# Patient Record
Sex: Female | Born: 1950
Health system: Southern US, Community
[De-identification: ages and names within clinical notes are randomized; demographics above are authoritative.]

## PROBLEM LIST (undated history)

## (undated) DIAGNOSIS — E119 Type 2 diabetes mellitus without complications: Secondary | ICD-10-CM

## (undated) DIAGNOSIS — J439 Emphysema, unspecified: Secondary | ICD-10-CM

## (undated) DIAGNOSIS — R011 Cardiac murmur, unspecified: Secondary | ICD-10-CM

## (undated) DIAGNOSIS — E079 Disorder of thyroid, unspecified: Secondary | ICD-10-CM

## (undated) DIAGNOSIS — M549 Dorsalgia, unspecified: Secondary | ICD-10-CM

## (undated) DIAGNOSIS — D649 Anemia, unspecified: Secondary | ICD-10-CM

## (undated) DIAGNOSIS — M1990 Unspecified osteoarthritis, unspecified site: Secondary | ICD-10-CM

## (undated) DIAGNOSIS — K219 Gastro-esophageal reflux disease without esophagitis: Secondary | ICD-10-CM

## (undated) DIAGNOSIS — F419 Anxiety disorder, unspecified: Secondary | ICD-10-CM

## (undated) DIAGNOSIS — I8390 Asymptomatic varicose veins of unspecified lower extremity: Secondary | ICD-10-CM

## (undated) DIAGNOSIS — E039 Hypothyroidism, unspecified: Secondary | ICD-10-CM

## (undated) DIAGNOSIS — T7840XA Allergy, unspecified, initial encounter: Secondary | ICD-10-CM

## (undated) HISTORY — DX: Allergy, unspecified, initial encounter: T78.40XA

## (undated) HISTORY — DX: Asymptomatic varicose veins of unspecified lower extremity: I83.90

## (undated) HISTORY — PX: CHOLECYSTECTOMY: SHX55

## (undated) HISTORY — DX: Cardiac murmur, unspecified: R01.1

## (undated) HISTORY — PX: SPINE SURGERY: SHX786

## (undated) HISTORY — DX: Type 2 diabetes mellitus without complications: E11.9

## (undated) HISTORY — PX: VARICOSE VEIN SURGERY: SHX832

## (undated) HISTORY — DX: Dorsalgia, unspecified: M54.9

## (undated) HISTORY — DX: Disorder of thyroid, unspecified: E07.9

## (undated) HISTORY — DX: Emphysema, unspecified: J43.9

---

## 1992-12-22 HISTORY — PX: ABDOMINAL HYSTERECTOMY: SHX81

## 1999-08-13 ENCOUNTER — Observation Stay (HOSPITAL_COMMUNITY): Admission: RE | Admit: 1999-08-13 | Discharge: 1999-08-14 | Payer: Self-pay | Admitting: Urology

## 2001-12-22 HISTORY — PX: INCONTINENCE SURGERY: SHX676

## 2004-10-08 ENCOUNTER — Ambulatory Visit: Payer: Self-pay | Admitting: General Practice

## 2004-12-30 ENCOUNTER — Ambulatory Visit: Payer: Self-pay | Admitting: General Surgery

## 2005-11-05 ENCOUNTER — Ambulatory Visit: Payer: Self-pay | Admitting: General Practice

## 2006-01-13 ENCOUNTER — Ambulatory Visit: Payer: Self-pay | Admitting: General Practice

## 2007-01-19 ENCOUNTER — Ambulatory Visit: Payer: Self-pay | Admitting: General Practice

## 2007-08-20 ENCOUNTER — Ambulatory Visit: Payer: Self-pay | Admitting: General Practice

## 2007-12-23 HISTORY — PX: COLONOSCOPY: SHX174

## 2007-12-23 LAB — HM COLONOSCOPY

## 2008-01-18 ENCOUNTER — Ambulatory Visit: Payer: Self-pay | Admitting: General Practice

## 2008-02-22 ENCOUNTER — Ambulatory Visit: Payer: Self-pay | Admitting: General Practice

## 2008-09-21 ENCOUNTER — Ambulatory Visit: Payer: Self-pay | Admitting: General Surgery

## 2008-10-06 ENCOUNTER — Ambulatory Visit: Payer: Self-pay | Admitting: General Practice

## 2010-02-11 ENCOUNTER — Ambulatory Visit: Payer: Self-pay | Admitting: Unknown Physician Specialty

## 2010-04-01 ENCOUNTER — Ambulatory Visit: Payer: Self-pay | Admitting: General Practice

## 2011-02-18 ENCOUNTER — Ambulatory Visit: Payer: Self-pay | Admitting: Unknown Physician Specialty

## 2012-02-24 ENCOUNTER — Ambulatory Visit: Payer: Self-pay | Admitting: Unknown Physician Specialty

## 2012-12-09 ENCOUNTER — Ambulatory Visit: Payer: Self-pay | Admitting: General Practice

## 2013-03-02 ENCOUNTER — Ambulatory Visit: Payer: Self-pay | Admitting: General Practice

## 2013-08-18 ENCOUNTER — Ambulatory Visit: Payer: Self-pay | Admitting: General Practice

## 2013-08-26 ENCOUNTER — Ambulatory Visit: Payer: Self-pay | Admitting: General Practice

## 2013-08-29 ENCOUNTER — Encounter: Payer: Self-pay | Admitting: *Deleted

## 2013-09-06 ENCOUNTER — Other Ambulatory Visit: Payer: Self-pay | Admitting: General Surgery

## 2013-09-06 ENCOUNTER — Encounter: Payer: Self-pay | Admitting: General Surgery

## 2013-09-06 ENCOUNTER — Ambulatory Visit (INDEPENDENT_AMBULATORY_CARE_PROVIDER_SITE_OTHER): Payer: BC Managed Care – PPO | Admitting: General Surgery

## 2013-09-06 VITALS — BP 118/64 | HR 86 | Resp 12 | Ht 65.0 in | Wt 170.0 lb

## 2013-09-06 DIAGNOSIS — K811 Chronic cholecystitis: Secondary | ICD-10-CM | POA: Insufficient documentation

## 2013-09-06 NOTE — Progress Notes (Signed)
Patient ID: Karla Patton, female   DOB: Dec 30, 1950, 62 y.o.   MRN: [id number]  Chief Complaint  Patient presents with  . Other    evaluation of gallbladder    HPI Karla Patton is a 62 y.o. female who presents for an evaluation of the gallbladder. The patient states she had had abdominal pain for approximately for 1 month. She has also had two episodes of nausea but no vomiting. The most recent abdominal ultrasound was performed on 08/26/13. The patient reports that the pain is typically occurring in the postprandial timeframe, and after 2 meals a Chick filet she had a more severe episodes of pain. Its typically in the right upper quadrant although on at least one occasion there's been some epigastric/left upper quadrant discomfort. There has been some radiation into the upper back on the right side.  The patient does not report any significant change in her blood sugar results except for improvement in her hemoglobin A1c over the last 3 months. She has not experienced any weight loss. She denies any diarrhea.   HPI  Past Medical History  Diagnosis Date  . Varicose vein   . Diabetes mellitus without complication   . Thyroid disease   . Back pain     Past Surgical History  Procedure Laterality Date  . Abdominal hysterectomy  1994  . Incontinence surgery  2003  . Colonoscopy  2009    normal    History reviewed. No pertinent family history.  Social History History  Substance Use Topics  . Smoking status: Never Smoker   . Smokeless tobacco: Not on file  . Alcohol Use: Yes    No Known Allergies  Current Outpatient Prescriptions  Medication Sig Dispense Refill  . ALPRAZolam (XANAX) 0.5 MG tablet Take 0.5 mg by mouth as needed for sleep.      . AMITIZA 24 MCG capsule Take 1 capsule by mouth daily.      [name] aspirin 81 MG tablet Take 81 mg by mouth daily.      [name] HUMALOG 100 UNIT/ML injection Via pump      . levothyroxine (SYNTHROID, LEVOTHROID) 100 MCG tablet Take 1 tablet by  mouth daily.      [name] LYRICA 75 MG capsule Take 1 capsule by mouth 2 (two) times daily.      [name] torsemide (DEMADEX) 20 MG tablet Take 20 mg by mouth as needed.       No current facility-administered medications for this visit.    Review of Systems Review of Systems  Constitutional: Negative.   Respiratory: Negative.   Gastrointestinal: Positive for nausea and abdominal pain.  No change in bowel frequency, diarrhea, mucus or blood per rectum.  Blood pressure 118/64, pulse 86, resp. rate 12, height 5\' 5"  (1.651 m), weight 170 lb (77.111 kg).  Physical Exam Physical Exam  Constitutional: She is oriented to person, place, and time. She appears well-developed and well-nourished.  Neck: No thyromegaly present.  Cardiovascular: Normal rate, regular rhythm and normal heart sounds.   No murmur heard. Pulmonary/Chest: Effort normal and breath sounds normal.  Abdominal: Soft. Normal appearance and bowel sounds are normal. There is tenderness in the right upper quadrant.  Lymphadenopathy:    She has no cervical adenopathy.  Neurological: She is alert and oriented to person, place, and time.  Skin: Skin is warm and dry.    Data Reviewed Abdominal ultrasound dated 08/18/2013 showed an adequately distended gallbladder without wall thickening, pericholecystic fluid or stones. A stable hemangioma  unchanged from 2009 is noted in the right lobe of the liver. HIDA scan with administration of a fatty meal showed an ejection of 54%, but the study was felt compromised by the very minimal activity within the gallbladder. The patient did report mild reproduction of her symptoms during the fatty meal ingestion. The gallbladder was not visualized until 70 minutes in the study.  Hemoglobin A1c dated 06/21/2013 was 8.5. Laboratory studies dated 03/06/2013 showed a sodium of 133, potassium 4.5, creatinine 0.7, hemoglobin 11.6 with an MCV of 88 and white blood cell count of 4800. Hemoglobin A1c at that time was  9.2. Colonoscopy completed October 2009 was normal. Assessment    Chronic cholecystitis.  Long-standing insulin-dependent diabetes well controlled on a pump.    Plan    The patient's clinical symptoms and examination today suggests a mild inflammatory process in the right upper quadrant, most likely related to the biliary tract. The patient is scheduled a [place] to the [place] on [place] 1. Options for management include early surgical intervention, delayed intervention or observation. She is reluctant to become an urgent surgical candidate out of the country, and to allow her maximum time for recovery efforts have been made to schedule her surgery for tomorrow.  The risks and related to surgery including those of bleeding, infection, intestinal or bile duct injury have been discussed. The possibility that this may not remove her pain has been reviewed.    Patient's surgery has been scheduled for 09-07-13 at [facility].   Karla Patton 09/06/2013, 8:43 PM

## 2013-09-06 NOTE — Patient Instructions (Addendum)
Patient to be schedule to have the gallbladder removed.   Laparoscopic Cholecystectomy Laparoscopic cholecystectomy is surgery to remove the gallbladder. The gallbladder is located slightly to the right of center in the abdomen, behind the liver. It is a concentrating and storage sac for the bile produced in the liver. Bile aids in the digestion and absorption of fats. Gallbladder disease (cholecystitis) is an inflammation of your gallbladder. This condition is usually caused by a buildup of gallstones (cholelithiasis) in your gallbladder. Gallstones can block the flow of bile, resulting in inflammation and pain. In severe cases, emergency surgery may be required. When emergency surgery is not required, you will have time to prepare for the procedure. Laparoscopic surgery is an alternative to open surgery. Laparoscopic surgery usually has a shorter recovery time. Your common bile duct may also need to be examined and explored. Your caregiver will discuss this with you if he or she feels this should be done. If stones are found in the common bile duct, they may be removed. LET YOUR CAREGIVER KNOW ABOUT:  Allergies to food or medicine.  Medicines taken, including vitamins, herbs, eyedrops, over-the-counter medicines, and creams.  Use of steroids (by mouth or creams).  Previous problems with anesthetics or numbing medicines.  History of bleeding problems or blood clots.  Previous surgery.  Other health problems, including diabetes and kidney problems.  Possibility of pregnancy, if this applies. RISKS AND COMPLICATIONS All surgery is associated with risks. Some problems that may occur following this procedure include:  Infection.  Damage to the common bile duct, nerves, arteries, veins, or other internal organs such as the stomach or intestines.  Bleeding.  A stone may remain in the common bile duct. BEFORE THE PROCEDURE  Do not take aspirin for 3 days prior to surgery or blood thinners  for 1 week prior to surgery.  Do not eat or drink anything after midnight the night before surgery.  Let your caregiver know if you develop a cold or other infectious problem prior to surgery.  You should be present 60 minutes before the procedure or as directed. PROCEDURE  You will be given medicine that makes you sleep (general anesthetic). When you are asleep, your surgeon will make several small cuts (incisions) in your abdomen. One of these incisions is used to insert a small, lighted scope (laparoscope) into the abdomen. The laparoscope helps the surgeon see into your abdomen. Carbon dioxide gas will be pumped into your abdomen. The gas allows more room for the surgeon to perform your surgery. Other operating instruments are inserted through the other incisions. Laparoscopic procedures may not be appropriate when:  There is major scarring from previous surgery.  The gallbladder is extremely inflamed.  There are bleeding disorders or unexpected cirrhosis of the liver.  A pregnancy is near term.  Other conditions make the laparoscopic procedure impossible. If your surgeon feels it is not safe to continue with a laparoscopic procedure, he or she will perform an open abdominal procedure. In this case, the surgeon will make an incision to open the abdomen. This gives the surgeon a larger view and field to work within. This may allow the surgeon to perform procedures that sometimes cannot be performed with a laparoscope alone. Open surgery has a longer recovery time. AFTER THE PROCEDURE  You will be taken to the recovery area where a nurse will watch and check your progress.  You may be allowed to go home the same day.  Do not resume physical activities until directed  by your caregiver.  You may resume a normal diet and activities as directed. Document Released: 12/08/2005 Document Revised: 03/01/2012 Document Reviewed: 05/23/2011 [facility] Patient Information 2014 [place],  [place].  Patient's surgery has been scheduled for 09-07-13 at [facility].

## 2013-09-07 ENCOUNTER — Other Ambulatory Visit: Payer: Self-pay | Admitting: *Deleted

## 2013-09-07 ENCOUNTER — Telehealth: Payer: Self-pay | Admitting: *Deleted

## 2013-09-07 ENCOUNTER — Encounter: Payer: Self-pay | Admitting: General Surgery

## 2013-09-07 ENCOUNTER — Ambulatory Visit: Payer: Self-pay | Admitting: General Surgery

## 2013-09-07 DIAGNOSIS — K811 Chronic cholecystitis: Secondary | ICD-10-CM

## 2013-09-07 HISTORY — PX: CHOLECYSTECTOMY: SHX55

## 2013-09-07 NOTE — Telephone Encounter (Signed)
Notified patient to move her insulin pump to left lower abdomen per Dr Lemar Livings and she states she has already done that.  She also said her glucose levels are better. Medications corrected in chart.

## 2013-09-08 ENCOUNTER — Encounter: Payer: Self-pay | Admitting: General Surgery

## 2013-09-08 LAB — PATHOLOGY REPORT

## 2013-09-12 ENCOUNTER — Encounter: Payer: Self-pay | Admitting: General Surgery

## 2013-09-14 ENCOUNTER — Ambulatory Visit (INDEPENDENT_AMBULATORY_CARE_PROVIDER_SITE_OTHER): Payer: BC Managed Care – PPO | Admitting: General Surgery

## 2013-09-14 ENCOUNTER — Encounter: Payer: Self-pay | Admitting: General Surgery

## 2013-09-14 VITALS — BP 120/68 | HR 74 | Resp 12 | Ht 65.0 in | Wt 168.0 lb

## 2013-09-14 DIAGNOSIS — K811 Chronic cholecystitis: Secondary | ICD-10-CM

## 2013-09-14 NOTE — Progress Notes (Signed)
Patient ID: Karla Patton, female   DOB: Aug 05, 1951, 62 y.o.   MRN: [id number]  Chief Complaint  Patient presents with  . Routine Post Op    gallbladder    HPI Karla Patton is a 62 y.o. female post op gallbladder. The procedure was done on 917/14. Patient states she is doing well. She reports it took the better part of a week to get her bowels back in order. No present difficulty with bowel movements.  HPI  Past Medical History  Diagnosis Date  . Varicose vein   . Diabetes mellitus without complication   . Thyroid disease   . Back pain     Past Surgical History  Procedure Laterality Date  . Abdominal hysterectomy  1994  . Incontinence surgery  2003  . Colonoscopy  2009    normal    No family history on file.  Social History History  Substance Use Topics  . Smoking status: Never Smoker   . Smokeless tobacco: Not on file  . Alcohol Use: Yes    No Known Allergies  Current Outpatient Prescriptions  Medication Sig Dispense Refill  . ALPRAZolam (XANAX) 0.5 MG tablet Take 0.5 mg by mouth as needed for sleep.      . AMITIZA 24 MCG capsule Take 1 capsule by mouth 2 (two) times daily with a meal.       . aspirin 81 MG tablet Take 81 mg by mouth daily.      [name] HUMALOG 100 UNIT/ML injection Via pump      . levothyroxine (SYNTHROID, LEVOTHROID) 100 MCG tablet Take 1 tablet by mouth daily.      [name] LYRICA 75 MG capsule Take 1 capsule by mouth 3 (three) times daily.       [name] torsemide (DEMADEX) 20 MG tablet Take 20 mg by mouth as needed.       No current facility-administered medications for this visit.    Review of Systems Review of Systems  Constitutional: Negative.   Respiratory: Negative.   Cardiovascular: Negative.     Blood pressure 120/68, pulse 74, resp. rate 12, height 5\' 5"  (1.651 m), weight 168 lb (76.204 kg).  Physical Exam Physical Exam  Constitutional: She is oriented to person, place, and time. She appears well-developed and well-nourished.   Cardiovascular: Normal rate, regular rhythm and normal heart sounds.   Pulmonary/Chest: Breath sounds normal.  Abdominal: Bowel sounds are normal.  Neurological: She is alert and oriented to person, place, and time.  Skin: Skin is warm and dry.    Data Reviewed Pathology showed chronic cholecystitis as well as fundic adenomyosis.  Assessment    Doing well status post cholecystectomy.    Plan    The patient was released to full activity. Care with heavy lifting was encouraged. Followup here will be on an as-needed basis.       Earline Mayotte 09/14/2013, 3:21 PM

## 2013-09-14 NOTE — Patient Instructions (Addendum)
Proper lifting techniques reviewed. 

## 2013-11-10 ENCOUNTER — Ambulatory Visit (INDEPENDENT_AMBULATORY_CARE_PROVIDER_SITE_OTHER): Payer: BC Managed Care – PPO | Admitting: General Surgery

## 2013-11-10 ENCOUNTER — Encounter: Payer: Self-pay | Admitting: General Surgery

## 2013-11-10 VITALS — BP 124/64 | HR 76 | Resp 14 | Ht 65.5 in | Wt 173.0 lb

## 2013-11-10 DIAGNOSIS — I83893 Varicose veins of bilateral lower extremities with other complications: Secondary | ICD-10-CM

## 2013-11-10 NOTE — Progress Notes (Signed)
Patient ID: Karla Patton, female   DOB: Jul 12, 1951, 62 y.o.   MRN: [id number]  Chief Complaint  Patient presents with  . Varicose Veins    HPI Karla Patton is a 62 y.o. female.  Here today for varicose veins evaluation. She has been seeing Dr Gilda Crease and was suppose to have a treatment but on the right leg.  The patient states that the left leg seems to be causing more trouble.  The left leg upper thigh and knee is where pain is and the right leg seems to be "ok". Wearing the compression hose since July and has not seen a big change.  The hose has helped the left ankle from swelling as much. She would like a second opinion. Dr Earnestine Leys treated both legs with laser and saline.  HPI  Past Medical History  Diagnosis Date  . Varicose vein   . Diabetes mellitus without complication   . Thyroid disease   . Back pain     Past Surgical History  Procedure Laterality Date  . Abdominal hysterectomy  1994  . Incontinence surgery  2003  . Colonoscopy  2009    normal  . Varicose vein surgery  1990's    Dr Lemar Livings  . Cholecystectomy  09-07-13    History reviewed. No pertinent family history.  Social History History  Substance Use Topics  . Smoking status: Never Smoker   . Smokeless tobacco: Not on file  . Alcohol Use: Yes    No Known Allergies  Current Outpatient Prescriptions  Medication Sig Dispense Refill  . Nutritional Supplements (ESTROVEN PO) Take 1 tablet by mouth daily.      [name] ALPRAZolam (XANAX) 0.5 MG tablet Take 0.5 mg by mouth as needed for sleep.      . AMITIZA 24 MCG capsule Take 1 capsule by mouth 2 (two) times daily with a meal.       . aspirin 81 MG tablet Take 81 mg by mouth daily.      [name] HUMALOG 100 UNIT/ML injection Via pump      . levothyroxine (SYNTHROID, LEVOTHROID) 100 MCG tablet Take 1 tablet by mouth daily.      [name] LYRICA 75 MG capsule Take 1 capsule by mouth 3 (three) times daily.       [name] torsemide (DEMADEX) 20 MG tablet Take 20 mg by mouth as needed.        No current facility-administered medications for this visit.    Review of Systems Review of Systems  Constitutional: Negative.   Respiratory: Negative.   Cardiovascular: Negative.     Blood pressure 124/64, pulse 76, resp. rate 14, height 5' 5.5" (1.664 m), weight 173 lb (78.472 kg).  Physical Exam Physical Exam  Constitutional: She is oriented to person, place, and time. She appears well-developed and well-nourished.  Eyes: Conjunctivae are normal. No scleral icterus.  Neck: Neck supple.  Cardiovascular: Normal rate, regular rhythm and normal heart sounds.   Pulses:      Dorsalis pedis pulses are 2+ on the right side, and 2+ on the left side.       Posterior tibial pulses are 2+ on the right side, and 2+ on the left side.  No lower leg edema noted at this time. Varicose veins seen bilateral. Left side > right side, areas involved calf on both sides and anteromedial left thigh  Pulmonary/Chest: Effort normal and breath sounds normal.  Lymphadenopathy:    She has no cervical adenopathy.  Neurological: She is alert  and oriented to person, place, and time.  Skin: Skin is warm and dry.    Data Reviewed Dr. Marijean Heath notes. 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 She has used 20-30 mm hg hose , knee lengths. Pt has a lot more vv on left side but no apparent insufficient major channel was seen. She may have better control with 30-40 mm hg compression and thigh length on left side.  Discussed fully with and she is aggreable.      Plan    Rx given for thigh length stocking for left leg, 30-40 mm hg.  Reassess in 2 mos with left leg venous Duplex study.       SANKAR,SEEPLAPUTHUR G 11/11/2013, 5:42 PM

## 2013-11-10 NOTE — Patient Instructions (Addendum)
The patient is aware to call back for any questions or concerns. Thigh length compression hose.

## 2013-11-11 ENCOUNTER — Encounter: Payer: Self-pay | Admitting: General Surgery

## 2013-11-11 DIAGNOSIS — I83819 Varicose veins of unspecified lower extremities with pain: Secondary | ICD-10-CM | POA: Insufficient documentation

## 2013-11-11 DIAGNOSIS — I83893 Varicose veins of bilateral lower extremities with other complications: Secondary | ICD-10-CM | POA: Insufficient documentation

## 2013-12-22 HISTORY — PX: OTHER SURGICAL HISTORY: SHX169

## 2014-01-10 ENCOUNTER — Encounter: Payer: Self-pay | Admitting: General Surgery

## 2014-01-10 ENCOUNTER — Ambulatory Visit (INDEPENDENT_AMBULATORY_CARE_PROVIDER_SITE_OTHER): Payer: BC Managed Care – PPO | Admitting: General Surgery

## 2014-01-10 ENCOUNTER — Other Ambulatory Visit: Payer: BC Managed Care – PPO

## 2014-01-10 VITALS — BP 138/68 | HR 80 | Resp 12 | Ht 65.5 in | Wt 170.0 lb

## 2014-01-10 DIAGNOSIS — I83893 Varicose veins of bilateral lower extremities with other complications: Secondary | ICD-10-CM

## 2014-01-10 NOTE — Progress Notes (Signed)
Patient ID: Karla Patton, female   DOB: 13-Jul-1951, 63 y.o.   MRN: [id number]  Chief Complaint  Patient presents with  . Follow-up    left leg venous ultrasound    HPI Karla Patton is a 63 y.o. female.  Here today for follow up left leg venous ultrasound. No new complaints. Wearing her compression hose. She has noticied less ankle swelling with the hose.  Recovering from upper respiratory infection. She is still having discomfort in left leg at day's end even with compression hose. Right leg is not symptomatic. HPI  Past Medical History  Diagnosis Date  . Varicose vein   . Diabetes mellitus without complication   . Thyroid disease   . Back pain     Past Surgical History  Procedure Laterality Date  . Abdominal hysterectomy  1994  . Incontinence surgery  2003  . Colonoscopy  2009    normal  . Varicose vein surgery  1990's    Dr Bary Castilla  . Cholecystectomy  09-07-13    History reviewed. No pertinent family history.  Social History History  Substance Use Topics  . Smoking status: Never Smoker   . Smokeless tobacco: Not on file  . Alcohol Use: Yes    No Known Allergies  Current Outpatient Prescriptions  Medication Sig Dispense Refill  . ALPRAZolam (XANAX) 0.5 MG tablet Take 0.5 mg by mouth as needed for sleep.      . AMITIZA 24 MCG capsule Take 1 capsule by mouth 2 (two) times daily with a meal.       . aspirin 81 MG tablet Take 81 mg by mouth daily.      [name] BAYER CONTOUR NEXT TEST test strip       . HUMALOG 100 UNIT/ML injection Via pump      . levothyroxine (SYNTHROID, LEVOTHROID) 100 MCG tablet Take 1 tablet by mouth daily.      [name] LYRICA 75 MG capsule Take 1 capsule by mouth 3 (three) times daily.       . Nutritional Supplements (ESTROVEN PO) Take 1 tablet by mouth daily.      [name] torsemide (DEMADEX) 20 MG tablet Take 20 mg by mouth as needed.       No current facility-administered medications for this visit.    Review of Systems Review of Systems   Constitutional: Negative.   Respiratory: Negative.   Cardiovascular: Negative.     Blood pressure 138/68, pulse 80, resp. rate 12, height 5' 5.5" (1.664 m), weight 170 lb (77.111 kg).  Physical Exam Physical Exam  Constitutional: She is oriented to person, place, and time. She appears Patton-developed and Patton-nourished.  Cardiovascular:  Pulses:      Dorsalis pedis pulses are 2+ on the right side, and 2+ on the left side.       Posterior tibial pulses are 2+ on the right side, and 2+ on the left side.  VV inleft thigh and calf , up to 21mm size. No vv seen in right leg. No edema.  Neurological: She is alert and oriented to person, place, and time.  Skin: Skin is warm and dry.    Data Reviewed Left leg venous Duplex shows vv connecting directly to the femoral vein. There is a long segment of GSV , very small in size in mid thigh.It is incompetant but does not seem to extend to the femoral vein.  Assessment    Symptomatic vv left leg. Best option is ligation of GSV at [facility]. Discussed this  fully with pt and she is agreeable.      Plan    Ligation of left GSV.        SANKAR,SEEPLAPUTHUR G 01/13/2014, 9:19 AM

## 2014-01-10 NOTE — Patient Instructions (Signed)
Patient to return for procedure.

## 2014-01-13 ENCOUNTER — Encounter: Payer: Self-pay | Admitting: General Surgery

## 2014-01-16 ENCOUNTER — Encounter: Payer: Self-pay | Admitting: General Surgery

## 2014-01-17 ENCOUNTER — Other Ambulatory Visit: Payer: Self-pay | Admitting: General Practice

## 2014-01-18 LAB — HM PAP SMEAR

## 2014-01-30 ENCOUNTER — Encounter: Payer: Self-pay | Admitting: General Surgery

## 2014-02-28 ENCOUNTER — Encounter: Payer: Self-pay | Admitting: General Surgery

## 2014-02-28 ENCOUNTER — Ambulatory Visit (INDEPENDENT_AMBULATORY_CARE_PROVIDER_SITE_OTHER): Payer: BC Managed Care – PPO | Admitting: General Surgery

## 2014-02-28 VITALS — BP 118/62 | HR 76 | Resp 12 | Ht 65.5 in | Wt 177.0 lb

## 2014-02-28 DIAGNOSIS — I83893 Varicose veins of bilateral lower extremities with other complications: Secondary | ICD-10-CM

## 2014-02-28 NOTE — Patient Instructions (Signed)
Keep area clean Derm a bond will gradually peel off

## 2014-02-28 NOTE — Progress Notes (Signed)
Patient ID: Karla Patton, female   DOB: 25-Dec-1950, 63 y.o.   MRN: [id number]   The patient presents for a left leg vein ligation procedure. No new problems at this time.   Procedure note: Patient was placed supine on the procedure table. Ultrasound was performed to document the site of saphenofemoral junction in left groin. This area was marked. 10 mL of 1% Xylocaine mixed with 0.5% Marcaine was instilled. The area was prepped with ChloraPrep. 2 cm transverse incision was made. Further dissection was done to identify a couple of tributaries connecting to the tortuous saphenous vein and these were taken down with ligatures of 3-0 Vicryl. The main saphenous vein was isolated clamped cut and ligated with 3-0 Vicryl. 3-0 Vicryl sutures placed the deep tissues and the skin closed with subcuticular 4-0 Vicryl covered with Dermabond. Procedure was well-tolerated and no immediate problems were encountered.

## 2014-03-02 ENCOUNTER — Encounter: Payer: Self-pay | Admitting: General Surgery

## 2014-03-08 ENCOUNTER — Ambulatory Visit (INDEPENDENT_AMBULATORY_CARE_PROVIDER_SITE_OTHER): Payer: BC Managed Care – PPO | Admitting: General Surgery

## 2014-03-08 ENCOUNTER — Encounter: Payer: Self-pay | Admitting: General Surgery

## 2014-03-08 VITALS — BP 128/70 | HR 74 | Resp 12 | Ht 65.0 in | Wt 176.0 lb

## 2014-03-08 DIAGNOSIS — I83893 Varicose veins of bilateral lower extremities with other complications: Secondary | ICD-10-CM

## 2014-03-08 NOTE — Progress Notes (Signed)
Patient ID: Karla Patton, female   DOB: 07-01-1951, 63 y.o.   MRN: [id number]  The patient presents for a post op left leg GSV ligation procedure. The patient denies any new problems. Patient doing well.   Patient reports notable reduction left leg discomfort. Left groin incision is healing well. Advised to continue to use compression hose. Patient to follow up in 6 weeks.

## 2014-03-08 NOTE — Patient Instructions (Signed)
Patient to return in 6 weeks for follow up. The patient is aware to call back for any questions or concerns.  

## 2014-03-10 ENCOUNTER — Ambulatory Visit: Payer: Self-pay | Admitting: General Practice

## 2014-03-10 LAB — HM MAMMOGRAPHY

## 2014-03-21 ENCOUNTER — Encounter: Payer: Self-pay | Admitting: General Surgery

## 2014-03-21 ENCOUNTER — Ambulatory Visit (INDEPENDENT_AMBULATORY_CARE_PROVIDER_SITE_OTHER): Payer: BC Managed Care – PPO | Admitting: General Surgery

## 2014-03-21 VITALS — BP 140/70 | HR 76 | Resp 14 | Ht 65.5 in | Wt 175.0 lb

## 2014-03-21 DIAGNOSIS — Z1211 Encounter for screening for malignant neoplasm of colon: Secondary | ICD-10-CM

## 2014-03-21 NOTE — Patient Instructions (Addendum)
Patient to return as needed. She is to be scheduled for a colonoscopy in 2019. The patient is aware to call back for any questions or concerns.

## 2014-03-21 NOTE — Progress Notes (Signed)
Patient ID: Karla Patton, female   DOB: March 30, 1951, 63 y.o.   MRN: [id number]  Chief Complaint  Patient presents with  . Other    screening colonoscopy    HPI Karla Patton is a 63 y.o. female who presents for a discussion for a screening colonoscopy. The patient denies any new problems at this time. The patient states she takes Amitiza for constipation which has not been working lately. She has added benefiber to help with bowel movements. Except for long-standing, intermittent constipation she has not noticed a marked change in her bowel habits. No history of blood or mucus. No unexplained weight loss or dietary intolerance.   HPI  Past Medical History  Diagnosis Date  . Varicose vein   . Diabetes mellitus without complication   . Thyroid disease   . Back pain     Past Surgical History  Procedure Laterality Date  . Abdominal hysterectomy  1994  . Incontinence surgery  2003  . Colonoscopy  2009    normal  . Varicose vein surgery  1990's    Dr Bary Castilla  . Cholecystectomy  09-07-13  . Left leg vein ligation  2015    History reviewed. No pertinent family history.  Social History History  Substance Use Topics  . Smoking status: Never Smoker   . Smokeless tobacco: Not on file  . Alcohol Use: Yes    No Known Allergies  Current Outpatient Prescriptions  Medication Sig Dispense Refill  . ALPRAZolam (XANAX) 0.5 MG tablet Take 0.5 mg by mouth as needed for sleep.      . AMITIZA 24 MCG capsule Take 1 capsule by mouth 2 (two) times daily with a meal.       . aspirin 81 MG tablet Take 81 mg by mouth daily.      [name] BAYER CONTOUR NEXT TEST test strip       . HUMALOG 100 UNIT/ML injection Via pump      . LANTUS 100 UNIT/ML injection       . levothyroxine (SYNTHROID, LEVOTHROID) 100 MCG tablet Take 1 tablet by mouth daily.      [name] LYRICA 75 MG capsule Take 1 capsule by mouth 3 (three) times daily.       . Nutritional Supplements (ESTROVEN PO) Take 1 tablet by mouth daily.      [name]  torsemide (DEMADEX) 20 MG tablet Take 20 mg by mouth as needed.      . Wheat Dextrin (BENEFIBER PO) Take by mouth.       No current facility-administered medications for this visit.    Review of Systems Review of Systems  Constitutional: Negative.   Respiratory: Negative.   Cardiovascular: Negative.   Gastrointestinal: Negative.     Blood pressure 140/70, pulse 76, resp. rate 14, height 5' 5.5" (1.664 m), weight 175 lb (79.379 kg).  Physical Exam Physical Exam  Constitutional: She is oriented to person, place, and time. She appears well-developed and well-nourished.  Neck: Neck supple. No thyromegaly present.  Cardiovascular: Normal rate and regular rhythm.   Murmur heard.  Systolic murmur is present with a grade of 1/6  Pulmonary/Chest: Effort normal and breath sounds normal.  Abdominal: Soft. Normal appearance and bowel sounds are normal. There is no hepatosplenomegaly. There is no tenderness. No hernia.  Lymphadenopathy:    She has no cervical adenopathy.  Neurological: She is alert and oriented to person, place, and time.  Skin: Skin is warm and dry.    Data Reviewed Colonoscopy dated  09/21/2008 was reviewed. This was a normal exam.  Assessment    No family history of colon cancer polyps, normal colonoscopy in 2009.    Plan    I would discourage the patient from having a followup colonoscopy exam at this time. She is asymptomatic and the risks associated with the procedure are greater than the potential for finding a malignant process. Reexamination in 2019 is encouraged unless she develops colonic symptoms in the interval.     PCP: Nelda Severe. Burt Ek MD   Robert Bellow 03/22/2014, 5:52 PM

## 2014-03-22 DIAGNOSIS — Z1211 Encounter for screening for malignant neoplasm of colon: Secondary | ICD-10-CM | POA: Insufficient documentation

## 2014-04-18 ENCOUNTER — Ambulatory Visit (INDEPENDENT_AMBULATORY_CARE_PROVIDER_SITE_OTHER): Payer: BC Managed Care – PPO | Admitting: General Surgery

## 2014-04-18 ENCOUNTER — Encounter: Payer: Self-pay | Admitting: General Surgery

## 2014-04-18 VITALS — BP 126/74 | HR 74 | Resp 12 | Ht 65.5 in | Wt 173.0 lb

## 2014-04-18 DIAGNOSIS — I83893 Varicose veins of bilateral lower extremities with other complications: Secondary | ICD-10-CM

## 2014-04-18 NOTE — Patient Instructions (Addendum)
Patient to continue use of compression hose. Patient to return in 6 months for a follow up. The patient is aware to call back for any questions or concerns.

## 2014-04-18 NOTE — Progress Notes (Signed)
Patient ID: Karla Patton, female   DOB: 1951-12-03, 63 y.o.   MRN: [id number]  Chief Complaint  Patient presents with  . Follow-up    Veins    HPI Karla Patton is a 63 y.o. female here today following up for her veins. Patient states she is doing well and wearing her compression hose.  HPI  Past Medical History  Diagnosis Date  . Varicose vein   . Diabetes mellitus without complication   . Thyroid disease   . Back pain     Past Surgical History  Procedure Laterality Date  . Abdominal hysterectomy  1994  . Incontinence surgery  2003  . Colonoscopy  2009    normal  . Varicose vein surgery  1990's    Dr Bary Castilla  . Cholecystectomy  09-07-13  . Left leg vein ligation  2015    No family history on file.  Social History History  Substance Use Topics  . Smoking status: Never Smoker   . Smokeless tobacco: Not on file  . Alcohol Use: Yes    No Known Allergies  Current Outpatient Prescriptions  Medication Sig Dispense Refill  . ALPRAZolam (XANAX) 0.5 MG tablet Take 0.5 mg by mouth as needed for sleep.      . AMITIZA 24 MCG capsule Take 1 capsule by mouth 2 (two) times daily with a meal.       . aspirin 81 MG tablet Take 81 mg by mouth daily.      Karla Patton BAYER CONTOUR NEXT TEST test strip       . HUMALOG 100 UNIT/ML injection Via pump      . LANTUS 100 UNIT/ML injection       . levothyroxine (SYNTHROID, LEVOTHROID) 100 MCG tablet Take 1 tablet by mouth daily.      Karla Patton LYRICA 75 MG capsule Take 1 capsule by mouth 3 (three) times daily.       . Nutritional Supplements (ESTROVEN PO) Take 1 tablet by mouth daily.      Karla Patton torsemide (DEMADEX) 20 MG tablet Take 20 mg by mouth as needed.       No current facility-administered medications for this visit.    Review of Systems Review of Systems  Constitutional: Negative.   Respiratory: Negative.   Cardiovascular: Negative.      Blood pressure 126/74, pulse 74, resp. rate 12, height 5' 5.5" (1.664 m), weight 173 lb (78.472  kg).  Physical Exam Physical Exam  Constitutional: She is oriented to person, place, and time. She appears well-developed and well-nourished.  Cardiovascular: Normal rate, regular rhythm and normal heart sounds.   No murmur heard. Pulses:      Dorsalis pedis pulses are 2+ on the right side, and 2+ on the left side.       Posterior tibial pulses are 2+ on the right side, and 2+ on the left side.  Pulmonary/Chest: Effort normal and breath sounds normal.  Abdominal: Soft. Bowel sounds are normal. There is no hepatosplenomegaly. There is no tenderness. No hernia.  Neurological: She is alert and oriented to person, place, and time.  Skin: Skin is warm and dry.  She has some residual varicose veins in the posterior left calf as well as the right. No edema present.  Minimal left thigh veins.   Data Reviewed  None  Assessment   S/P ligation of left VV connection to femoral vein.  Varicose veins with improved symptoms in left leg. She has an incompetent right GSV which is asymptomatic  at present.     Plan    Patient to return in 6 months for follow up. Patient advised to continue the use of compression hose.        Karla Patton 04/18/2014, 9:36 AM

## 2014-04-25 LAB — HM DIABETES EYE EXAM

## 2014-10-03 LAB — TSH: TSH: 1.51 u[IU]/mL (ref 0.41–5.90)

## 2014-10-03 LAB — HEMOGLOBIN A1C: Hgb A1c MFr Bld: 8.6 % — AB (ref 4.0–6.0)

## 2014-10-03 LAB — LIPID PANEL
Cholesterol: 218 mg/dL — AB (ref 0–200)
HDL: 74 mg/dL — AB (ref 35–70)
LDL Cholesterol: 131 mg/dL
Triglycerides: 67 mg/dL (ref 40–160)

## 2014-10-03 LAB — BASIC METABOLIC PANEL
BUN: 8 mg/dL (ref 4–21)
Creatinine: 0.7 mg/dL (ref 0.5–1.1)
Glucose: 86 mg/dL
Potassium: 4.3 mmol/L (ref 3.4–5.3)
Sodium: 132 mmol/L — AB (ref 137–147)

## 2014-10-03 LAB — HEPATIC FUNCTION PANEL
ALT: 22 U/L (ref 7–35)
AST: 26 U/L (ref 13–35)
Alkaline Phosphatase: 93 U/L (ref 25–125)
Bilirubin, Total: 0.3 mg/dL

## 2014-10-03 LAB — CBC AND DIFFERENTIAL
HCT: 36 % (ref 36–46)
Hemoglobin: 12.4 g/dL (ref 12.0–16.0)
Platelets: 390 10*3/uL (ref 150–399)
WBC: 4.6 10^3/mL

## 2014-10-10 ENCOUNTER — Encounter: Payer: Self-pay | Admitting: Endocrinology

## 2014-10-10 ENCOUNTER — Ambulatory Visit (INDEPENDENT_AMBULATORY_CARE_PROVIDER_SITE_OTHER): Payer: BC Managed Care – PPO | Admitting: Endocrinology

## 2014-10-10 VITALS — BP 130/72 | HR 75

## 2014-10-10 DIAGNOSIS — E1165 Type 2 diabetes mellitus with hyperglycemia: Secondary | ICD-10-CM

## 2014-10-10 DIAGNOSIS — E11649 Type 2 diabetes mellitus with hypoglycemia without coma: Secondary | ICD-10-CM

## 2014-10-10 DIAGNOSIS — E039 Hypothyroidism, unspecified: Secondary | ICD-10-CM | POA: Insufficient documentation

## 2014-10-10 DIAGNOSIS — E109 Type 1 diabetes mellitus without complications: Secondary | ICD-10-CM | POA: Insufficient documentation

## 2014-10-10 DIAGNOSIS — IMO0002 Reserved for concepts with insufficient information to code with codable children: Secondary | ICD-10-CM

## 2014-10-10 LAB — HM DIABETES FOOT EXAM: HM Diabetic Foot Exam: NORMAL

## 2014-10-10 MED ORDER — GLUCAGON HCL (RDNA) 1 MG IJ SOLR: 1.0000 mg | Freq: Once | INTRAMUSCULAR | Status: DC | PRN

## 2014-10-10 NOTE — Progress Notes (Signed)
Reason for visit-  Karla Patton is a 63 y.o.-year-old female, referred by her PCP,  Everlean Alstrom, MD for management of likelyType 2 diabetes, uncontrolled, without complications.   HPI- Patient has been diagnosed with diabetes in 1984. Recalls being initially on lifestyle modifications and oral medications for about 18 months. Was on an insulin pump for 11 years- 2014 when A1c >10%, then moved back to basal/bolus insulin, because she felt that pump was not working well for her. Then started on lantus and novolog pens- last Friday, October 2015 switched to [facility] due to frequent night time lows. Feels that night time lows have decreased after this switch.  Pt is currently on a regimen of:  - Toujeo 18 units qhs - Novolog per carb calcutions 1: 15 carbs, tgt glucose 150, ISF 50 units qac TID Reports that insulin drips after some injections inspite of counting to 5. When she notices that, she takes an additional unit or so, to counter this.  She has also been correcting for higher sugars >150 about 1-2 times daily with Novolog - most times in about 2 hours PP. Adjusted target glucose to 150 from 100 recently to prevent lows.    Last hemoglobin A1c was: Lab Results  Component Value Date   HGBA1C 8.6* 10/03/2014     Pt checks her sugars 10x a day . Uses Bayer glucometer. By meter review they are:  PREMEAL Breakfast Lunch Dinner Bedtime Overall  Glucose range: 73-149- 212-58-44 66-196- 121 256 147-96- 53-44-57   Mean/median:        POST-MEAL PC Breakfast PC Lunch PC Dinner  Glucose range: 60-76 211 106  Mean/median:       Hypoglycemia-  frequent lows. Lowest sugar was 32 this morning; she has hypoglycemia awareness at 70-100- however this has been happening only on occasion. Significant other is retired EMT and treated her with juice. Drank 2-2.5 glasses alcohol last night. Sugar was normal range prior to arrival in the 90s range. Reports low sugars every other day for the  past 2 months. Lows at all times of the day. 3 weeks ago, needed EMT help when passed out from low sugars.   Dietary habits- eats  Mainly 2 times daily-occasionally 3 times. Tries to limit carbs, sweetened beverages. Has desserts- cheesecakes about 2 x weekly atleast.  Exercise- no, plans to start treadmill Weight -  Reports gaining 40 lbs in past 5 years. Stable recently Wt Readings from Last 3 Encounters:  04/18/14 173 lb (78.472 kg)  03/21/14 175 lb (79.379 kg)  03/08/14 176 lb (79.833 kg)    Diabetes Complications-  Nephropathy- No  CKD, last BUN/creatinine- GFR 94 Lab Results  Component Value Date   BUN 8 10/03/2014   Lab Results  Component Value Date   CREATININE 0.7 10/03/2014    Retinopathy- No, Last DEE was in May 2015 Neuropathy- occasional burning of her toes from her back pain. Takes lyrica for back. No numbness and tingling in her feet. No known neuropathy.  Associated history - No CAD . No prior stroke. Has hypothyroidism. her last TSH was  Lab Results  Component Value Date   TSH 1.51 10/03/2014  Being treated with Levothyroxine by PCP.   Hyperlipidemia-  her last set of lipids were- Currently on dietary therapy. Tolerating well.    Lab Results  Component Value Date   CHOL 218* 10/03/2014   HDL 74* 10/03/2014   LDLCALC 131 10/03/2014   TRIG 67 10/03/2014    Blood Pressure/HTN-  Patient's blood pressure is well controlled today.   Pt has FH of DM in mother.   I have reviewed the patient's past medical history, family and social history, surgical history, medications and allergies.    Review of Systems: [x]  complains of  [  ] denies General:   [x  ] Recent weight change [ x ] Fatigue  [ x ] Loss of appetite Eyes: [ x ]  Vision Difficulty [  ]  Eye pain ENT: [  ]  Hearing difficulty [  ]  Difficulty Swallowing CVS: [  ] Chest pain [  ]  Palpitations/Irregular Heart beat [  ]  Shortness of breath lying flat [ x ] Swelling of legs Resp: [  ] Frequent Cough  [  ] Shortness of Breath  [  ]  Wheezing GI: [  ] Heartburn  [  ] Nausea or Vomiting  [  ] Diarrhea [ x ] Constipation  [  ] Abdominal Pain GU: [  ]  Polyuria  [ x ]  nocturia Bones/joints:  [  ]  Muscle aches  [  ] Joint Pain  [  ] Bone pain Skin/Hair/Nails: [  ]  Rash  [  ] New stretch marks [  ]  Itching [  ] Hair loss [  ]  Excessive hair growth Reproduction: [ x ] Low sexual desire , [  ]  Women: Menstrual cycle problems [  ]  Women: Breast Discharge [  ] Men: Difficulty with erections [  ]  Men: Enlarged Breasts CNS: [ x ] Frequent Headaches [ x ] Blurry vision [  ] Tremors [  ] Seizures [ x ] Loss of consciousness [  ] Localized weakness Endocrine: [  ]  Excess thirst [  ]  Feeling excessively hot [  ]  Feeling excessively cold Heme: [ x ]  Easy bruising [  ]  Enlarged glands or lumps in neck Allergy: [  ]  Food allergies [  ] Environmental allergies  PE: BP 130/72  Pulse 75  SpO2 96% Wt Readings from Last 3 Encounters:  04/18/14 173 lb (78.472 kg)  03/21/14 175 lb (79.379 kg)  03/08/14 176 lb (79.833 kg)   GENERAL: No acute distress, well developed HEENT:  Eye exam shows normal external appearance. Oral exam shows normal mucosa .  NECK:   Neck exam shows no lymphadenopathy. No Carotids bruits. Thyroid is not enlarged and no nodules felt.   LUNGS:         Chest is symmetrical. Lungs are clear to auscultation.[name]   HEART:         Heart sounds:  S1 and S2 are normal. No murmurs or clicks heard. ABDOMEN:  No Distention present. Liver and spleen are not palpable. No other mass or tenderness present.  EXTREMITIES:     There is no edema. 2+ DP pulses  NEUROLOGICAL:     Grossly intact.            Diabetic foot exam done with shoes and socks removed: Normal Monofilament testing bilaterally. Right great toe deformity due to prior surgery after injury. Nails  Not dystrophic. Skin normal color. No open wounds. MUSCULOSKELETAL:       There is no enlargement or gross deformity of the joints.   SKIN:       No rash  ASSESSMENT AND PLAN: 1. Likely type 2 Diabetes, uncontrolled 2. Hypoglycemia  Problem List Items Addressed This Visit  Endocrine   Diabetic hypoglycemia     Hypoglycemia recognition and treatment protocol discussed. Glucagon script given to patient with instructions on how to use it.  Feel that her hypoglycemia is likely related to over-correction with Novolog, skipping meals, over treating low sugars with subsequent highs, and alcohol intake.   She will do the following - eat consistent meals, avoid skipping meals, avoid alcohol, avoid excess exercise for now.  Goal over next 2 weeks  is to avoid hypoglycemia .   RTC 2 weeks. Could consider diagnostic CGM testing if hypoglycemia persists.     Relevant Medications      Insulin Glargine (TOUJEO SOLOSTAR [place])      insulin aspart (NOVOLOG) 100 UNIT/ML injection      glucagon (GLUCAGEN) 1 MG SOLR injection     Other   Type 2 diabetes mellitus, uncontrolled - Primary      She could have likely type 2 diabetes, but will test for etiology at next few visits.   Discussed that recent A1c was not at goal, and she has high variability in her sugars.  She wishes to continue on basal/bolus insulin plan for now. Discussed insulin administration and preventing leakage from injection site.   Change Toujeo to 16 units daily.  Continue current Novolog dosing for meal times, but she was asked to avoid using this for correction in between meals.  Continue to check sugars 4-6 times daily.   Up to date with eye exam and foot exam for this year.  LDL higher range, will readdress at future visits. Dietary therapy for now.  BP okay for now. Needs urine MA at next visits.   Discussed at length about diet modifications, and portion sizes and reducing carbs.     Relevant Medications      Insulin Glargine (TOUJEO SOLOSTAR )      insulin aspart (NOVOLOG) 100 UNIT/ML injection      glucagon (GLUCAGEN) 1 MG SOLR injection         - Return to clinic in 2 weeks with sugar log/meter. 50 minutes spent with the patient, > 50% time spent on topics and counseling mentioned above.   Tu Shimmel [facility] 10/10/2014 12:55 PM

## 2014-10-10 NOTE — Assessment & Plan Note (Addendum)
She could have likely type 2 diabetes, but will test for etiology at next few visits.   Discussed that recent A1c was not at goal, and she has high variability in her sugars.  She wishes to continue on basal/bolus insulin plan for now. Discussed insulin administration and preventing leakage from injection site.   Change Toujeo to 16 units daily.  Continue current Novolog dosing for meal times, but she was asked to avoid using this for correction in between meals.  Continue to check sugars 4-6 times daily.   Up to date with eye exam and foot exam for this year.  LDL higher range, will readdress at future visits. Dietary therapy for now.  BP okay for now. Needs urine MA at next visits.   Discussed at length about diet modifications, and portion sizes and reducing carbs.

## 2014-10-10 NOTE — Progress Notes (Signed)
Pre visit review using our clinic review tool, if applicable. No additional management support is needed unless otherwise documented below in the visit note. 

## 2014-10-10 NOTE — Assessment & Plan Note (Signed)
Hypoglycemia recognition and treatment protocol discussed. Glucagon script given to patient with instructions on how to use it.  Feel that her hypoglycemia is likely related to over-correction with Novolog, skipping meals, over treating low sugars with subsequent highs, and alcohol intake.   She will do the following - eat consistent meals, avoid skipping meals, avoid alcohol, avoid excess exercise for now.  Goal over next 2 weeks  is to avoid hypoglycemia .   RTC 2 weeks. Could consider diagnostic CGM testing if hypoglycemia persists.

## 2014-10-10 NOTE — Patient Instructions (Signed)
Check sugars 4-6 x daily ( before each meal and at bedtime).  Record them in a log book and bring that/meter to next appointment.   Avoid alcohol and excess exercise.  Avoid skipping meals. Eat BF, lunch and supper at consistent times.  Avoid giving Novolog for correction for high sugars apart from when you are taking it for meal times.  "Count to 10 "for insulin injection to make sure that all the insulin is going in.   Cut back Tuojeo to 16 units Ord daily.  Give Novolog prior to meal.   Treat hypoglycemia per protocol.   Please come back for a follow-up appointment in 2 weeks

## 2014-10-17 ENCOUNTER — Ambulatory Visit (INDEPENDENT_AMBULATORY_CARE_PROVIDER_SITE_OTHER): Payer: BC Managed Care – PPO | Admitting: General Surgery

## 2014-10-17 ENCOUNTER — Encounter: Payer: Self-pay | Admitting: General Surgery

## 2014-10-17 VITALS — BP 134/78 | HR 72 | Resp 12 | Ht 65.5 in | Wt 181.0 lb

## 2014-10-17 DIAGNOSIS — I83892 Varicose veins of left lower extremities with other complications: Secondary | ICD-10-CM

## 2014-10-17 NOTE — Progress Notes (Signed)
Patient ID: Karla Patton, female   DOB: [birth year], 63 y.o.   MRN: [id number]  Chief Complaint  Patient presents with  . Varicose Veins    follow up    HPI Karla Patton is a 63 y.o. female.  Here today for follow up from her varicose veins. No new complaints. Wearing her compression hose. She has noticied ankle swelling when she is on her feet for long period of time. Overall her legs feel "good". She is post left leg GSV ligation procedure on 02-28-14. Her new endocrinologist is Dr. Howell Rucks, her FSBS is am was 292.   HPI  Past Medical History  Diagnosis Date  . Varicose vein   . Diabetes mellitus without complication   . Thyroid disease   . Back pain     Past Surgical History  Procedure Laterality Date  . Abdominal hysterectomy  1994  . Incontinence surgery  2003  . Colonoscopy  2009    normal  . Varicose vein surgery  1990's    Dr Bary Castilla  . Cholecystectomy  09-07-13  . Left leg vein ligation  2015    Family History  Problem Relation Age of Onset  . Diabetes Mother   . Heart disease Father   . Stroke Father   . Hypertension Father     Social History History  Substance Use Topics  . Smoking status: Never Smoker   . Smokeless tobacco: Not on file  . Alcohol Use: Yes    No Known Allergies  Current Outpatient Prescriptions  Medication Sig Dispense Refill  . ALPRAZolam (XANAX) 0.5 MG tablet Take 0.5 mg by mouth as needed for sleep.      . AMITIZA 24 MCG capsule Take 1 capsule by mouth 2 (two) times daily with a meal.       . aspirin 81 MG tablet Take 81 mg by mouth daily.      [name] BAYER CONTOUR NEXT TEST test strip       . glucagon (GLUCAGEN) 1 MG SOLR injection Inject 1 mg into the muscle once as needed for low blood sugar.  1 mg  12  . insulin aspart (NOVOLOG) 100 UNIT/ML injection Inject into the skin as needed for high blood sugar. Sliding scale.      . Insulin Glargine (TOUJEO SOLOSTAR [place]) Inject 16 Units into the skin daily.       [name] levothyroxine  (SYNTHROID, LEVOTHROID) 100 MCG tablet Take 1 tablet by mouth daily.      [name] LYRICA 75 MG capsule Take 1 capsule by mouth 3 (three) times daily.       [name] torsemide (DEMADEX) 20 MG tablet Take 20 mg by mouth as needed.       No current facility-administered medications for this visit.    Review of Systems Review of Systems  Constitutional: Negative.   Respiratory: Negative.   Cardiovascular: Negative.   Neurological: Positive for headaches.    Blood pressure 134/78, pulse 72, resp. rate 12, height 5' 5.5" (1.664 m), weight 181 lb (82.101 kg).  Physical Exam Physical Exam  Constitutional: She is oriented to person, place, and time. She appears well-developed and well-nourished.  Eyes: Conjunctivae are normal. No scleral icterus.  Neck: Neck supple.  Cardiovascular:  Pulses:      Dorsalis pedis pulses are 2+ on the right side, and 2+ on the left side.       Posterior tibial pulses are 2+ on the right side, and 2+ on the left side.  No lower leg edema.  Lymphadenopathy:    She has no cervical adenopathy.  Neurological: She is alert and oriented to person, place, and time.  Skin: Skin is warm and dry.    Data Reviewed none  Assessment    Stable physical exam. No lower leg edema. Few residual veins noted.    Plan    Follow up in one year. Continue to wear compression hose.       Mattisyn Cardona G 10/18/2014, 6:10 AM

## 2014-10-17 NOTE — Patient Instructions (Addendum)
The patient is aware to call back for any questions or concerns.Follow up in one year. Continue to wear compression hose. Phlebitis Phlebitis is soreness and puffiness (swelling) in a vein.  HOME CARE  Only take medicine as told by your doctor.  Raise (elevate) the affected limb on a pillow as told by your doctor.  Keep a warm pack on the affected vein as told by your doctor. Do not sleep with a heating pad.  Use special stockings or bandages around the area of the affected vein as told by your doctor. These will speed healing and keep the condition from coming back.  Talk to your doctor about all the medicines you take.  Get follow-up blood tests as told by your doctor.  If the phlebitis is in your legs:  Avoid standing or resting for long periods.  Keep your legs moving. Raise your legs when you sit or lie.  Do not smoke.  Follow-up with your doctor as told. GET HELP IF:  You have strange bruises or bleeding.  Your puffiness or pain in the affected area is not getting better.  You are taking medicine to lessen puffiness (anti-inflammatory medicine), and you get belly pain.  You have a fever. GET HELP RIGHT AWAY IF:   The phlebitis gets worse and you have more pain, puffiness (swelling), or redness.  You have trouble breathing or have chest pain. MAKE SURE YOU:   Understand these instructions.  Will watch your condition.  Will get help right away if you are not doing well or get worse. Document Released: 11/26/2009 Document Revised: 12/13/2013 Document Reviewed: 08/15/2013 [facility] Patient Information 2015 [place], [place]. This information is not intended to replace advice given to you by your health care provider. Make sure you discuss any questions you have with your health care provider.

## 2014-10-18 ENCOUNTER — Encounter: Payer: Self-pay | Admitting: General Surgery

## 2014-10-23 ENCOUNTER — Encounter: Payer: Self-pay | Admitting: General Surgery

## 2014-10-24 ENCOUNTER — Encounter: Payer: Self-pay | Admitting: Endocrinology

## 2014-10-24 ENCOUNTER — Ambulatory Visit (INDEPENDENT_AMBULATORY_CARE_PROVIDER_SITE_OTHER): Payer: BC Managed Care – PPO | Admitting: Endocrinology

## 2014-10-24 VITALS — BP 128/72 | HR 74 | Wt 182.5 lb

## 2014-10-24 DIAGNOSIS — IMO0002 Reserved for concepts with insufficient information to code with codable children: Secondary | ICD-10-CM

## 2014-10-24 DIAGNOSIS — E1165 Type 2 diabetes mellitus with hyperglycemia: Secondary | ICD-10-CM

## 2014-10-24 DIAGNOSIS — E11649 Type 2 diabetes mellitus with hypoglycemia without coma: Secondary | ICD-10-CM

## 2014-10-24 NOTE — Assessment & Plan Note (Signed)
  Most recent A1c above goal. Decreased frequency of hypoglycemia since past visit.   Asked her not to give corrective boluses unless with rapid acting insulin, unless her sugars are >350 at bedtime.  Continue current Novolog calculations except at lunch time- Change I:C to 20.   Change Toujeo to 17 units daily.   Refer to DM education for advanced carb calculations, as feel that her variability in sugars could be related to wrongly calculating carbs, especially when she eats outside in restaurants.   Will check labs for DM etiology at next few visits.  LDL higher range- readdress at next few visits. Dietary therapy for now.  Needs urine MA at next few visits as well.

## 2014-10-24 NOTE — Progress Notes (Signed)
Pre visit review using our clinic review tool, if applicable. No additional management support is needed unless otherwise documented below in the visit note. 

## 2014-10-24 NOTE — Assessment & Plan Note (Signed)
Overall reduced in frequency.  Asked her not to skip meals, eat at consistent times, avoid corrective boluses and work on accurate carb calculations.

## 2014-10-24 NOTE — Progress Notes (Signed)
Reason for visit-  Karla Patton is a 63 y.o.-year-old female,  Here for management of likelyType 2 diabetes, uncontrolled, without complications. Last appt 2 weeks ago.    HPI- Diagnosed with diabetes in 1984. Recalls being initially on lifestyle modifications and oral medications for about 18 months. Was on an insulin pump for 11 years- 2014 when A1c >10%, then moved back to basal/bolus insulin, because she felt that pump was not working well for her.  Then started on lantus and novolog pens- October 2015 switched to [facility] due to frequent night time lows. Feels that night time lows have decreased after this switch.  Pt is currently on a regimen of:  - Toujeo 16 units qhs - Novolog per carb calcutions 1: 15 carbs, tgt glucose 150, ISF 50 units  qac TID Reports that insulin drips after some injections inspite of counting to 5. When she notices that, she takes an additional unit or so, to counter this. She has tried to hold it in longer- but still continues to drip out of injections ites on abdomen She had also been correcting for higher sugars >150 about 1-2 times daily with Novolog - most times in about 2 hours PP. Now reduced corrective bolus use, but still trying to correct high sugars in the morning. Now trying to eat consistent meals as well.    Last hemoglobin A1c was: Lab Results  Component Value Date   HGBA1C 8.6* 10/03/2014     Pt checks her sugars 10x a day . Uses Bayer glucometer. By sugar log they are:  PREMEAL Breakfast Lunch Dinner Bedtime Overall  Glucose range: 162-232- 470 [phone] 126-315 130-198   Mean/median:        POST-MEAL PC Breakfast PC Lunch PC Dinner  Glucose range:  Lows 70-80   Mean/median:       Hypoglycemia-  frequent lows. Overall frequency has decreased, seems to be happening mostly post lunch.  No recent lows needing assistance. Prior was getting lows very frequently.   Dietary habits- eats  Mainly 2 times daily-occasionally 3 times.  Tries to limit carbs, sweetened beverages. Has desserts- cheesecakes about 2 x weekly atleast.  Exercise- no, plans to start treadmill Weight -  Reports gaining 40 lbs in past 5 years. Stable recently Wt Readings from Last 3 Encounters:  10/24/14 182 lb 8 oz (82.781 kg)  10/17/14 181 lb (82.101 kg)  04/18/14 173 lb (78.472 kg)    Diabetes Complications-  Nephropathy- No  CKD, last BUN/creatinine- GFR 94 Lab Results  Component Value Date   BUN 8 10/03/2014   Lab Results  Component Value Date   CREATININE 0.7 10/03/2014  No results found for: GFR, MICRALBCREAT   Retinopathy- No, Last DEE was in May 2015 Neuropathy- occasional burning of her toes from her back pain. Takes lyrica for back. No numbness and tingling in her feet. No known neuropathy.  Associated history - No CAD . No prior stroke. Has hypothyroidism. her last TSH was  Lab Results  Component Value Date   TSH 1.51 10/03/2014  Being treated with Levothyroxine by PCP.   Hyperlipidemia-  her last set of lipids were- Currently on dietary therapy. Tolerating well.    Lab Results  Component Value Date   CHOL 218* 10/03/2014   HDL 74* 10/03/2014   LDLCALC 131 10/03/2014   TRIG 67 10/03/2014    Blood Pressure/HTN- Patient's blood pressure is well controlled today.    I have reviewed the patient's past medical history, medications and allergies.  Current Outpatient Prescriptions on File Prior to Visit  Medication Sig Dispense Refill  . ALPRAZolam (XANAX) 0.5 MG tablet Take 0.5 mg by mouth as needed for sleep.    . AMITIZA 24 MCG capsule Take 1 capsule by mouth 2 (two) times daily with a meal.     . aspirin 81 MG tablet Take 81 mg by mouth daily.    [name] BAYER CONTOUR NEXT TEST test strip     . glucagon (GLUCAGEN) 1 MG SOLR injection Inject 1 mg into the muscle once as needed for low blood sugar. 1 mg 12  . insulin aspart (NOVOLOG) 100 UNIT/ML injection Inject into the skin as needed for high blood sugar. Sliding  scale.    . Insulin Glargine (TOUJEO SOLOSTAR [place]) Inject 16 Units into the skin daily.     [name] levothyroxine (SYNTHROID, LEVOTHROID) 100 MCG tablet Take 1 tablet by mouth daily.    [name] LYRICA 75 MG capsule Take 1 capsule by mouth 3 (three) times daily.     [name] torsemide (DEMADEX) 20 MG tablet Take 20 mg by mouth as needed.     No current facility-administered medications on file prior to visit.   No Known Allergies  Review of Systems- [ x ]  Complains of    [  ]  denies [  ] Recent weight change [ x ]  Fatigue [  ] polydipsia [  ] polyuria [  ]  nocturia [  x]  vision difficulty [  ] chest pain [  ] shortness of breath [ x ] leg swelling [  ] cough [  ] nausea/vomiting [  ] diarrhea [  ] constipation [  ] abdominal pain [ x ]  tingling/numbness in extremities [  ]  concern with feet ( wounds/sores)   PE: BP 128/72 mmHg  Pulse 74  Wt 182 lb 8 oz (82.781 kg)  SpO2 98% Wt Readings from Last 3 Encounters:  10/24/14 182 lb 8 oz (82.781 kg)  10/17/14 181 lb (82.101 kg)  04/18/14 173 lb (78.472 kg)   Exam: deferred  ASSESSMENT AND PLAN: 1. Likely type 2 Diabetes, uncontrolled 2. Hypoglycemia  Problem List Items Addressed This Visit      Endocrine   Diabetic hypoglycemia    Overall reduced in frequency.  Asked her not to skip meals, eat at consistent times, avoid corrective boluses and work on accurate carb calculations.      Relevant Orders      Ambulatory referral to diabetic education     Other   Type 2 diabetes mellitus, uncontrolled - Primary     Most recent A1c above goal. Decreased frequency of hypoglycemia since past visit.   Asked her not to give corrective boluses unless with rapid acting insulin, unless her sugars are >350 at bedtime.  Continue current Novolog calculations except at lunch time- Change I:C to 20.   Change Toujeo to 17 units daily.   Refer to DM education for advanced carb calculations, as feel that her variability in sugars could be related to  wrongly calculating carbs, especially when she eats outside in restaurants.   Will check labs for DM etiology at next few visits.  LDL higher range- readdress at next few visits. Dietary therapy for now.  Needs urine MA at next few visits as well.     Relevant Orders      Ambulatory referral to diabetic education        - Return to clinic in 1 month  with sugar log/meter. 25 minutes spent with the patient, > 50% time spent on topics and counseling mentioned above.   Jaima Janney [facility] 10/24/2014 4:42 PM

## 2014-10-24 NOTE — Patient Instructions (Signed)
  Eat consistent meals. Try to work on carb calculations. Refer to [facility] for carb calculations.    Change Toujeo at 17 units at bedtime.  Change I: C ratio to 20 at lunch time, Continue current Novolog calculations for other meals.  Avoid correction Novolog doses at any other times, apart from your mealtimes.   Please come back for a follow-up appointment in 1 month.

## 2014-11-03 ENCOUNTER — Ambulatory Visit: Payer: Self-pay | Admitting: Endocrinology

## 2014-11-21 ENCOUNTER — Ambulatory Visit (INDEPENDENT_AMBULATORY_CARE_PROVIDER_SITE_OTHER): Payer: BC Managed Care – PPO | Admitting: Endocrinology

## 2014-11-21 ENCOUNTER — Ambulatory Visit: Payer: Self-pay | Admitting: Endocrinology

## 2014-11-21 ENCOUNTER — Encounter: Payer: Self-pay | Admitting: Endocrinology

## 2014-11-21 VITALS — BP 130/82 | HR 68 | Wt 177.8 lb

## 2014-11-21 DIAGNOSIS — E11649 Type 2 diabetes mellitus with hypoglycemia without coma: Secondary | ICD-10-CM

## 2014-11-21 DIAGNOSIS — IMO0002 Reserved for concepts with insufficient information to code with codable children: Secondary | ICD-10-CM

## 2014-11-21 DIAGNOSIS — E1165 Type 2 diabetes mellitus with hyperglycemia: Secondary | ICD-10-CM

## 2014-11-21 LAB — MICROALBUMIN / CREATININE URINE RATIO
Creatinine,U: 40.3 mg/dL
Microalb Creat Ratio: 1 mg/g (ref 0.0–30.0)
Microalb, Ur: 0.4 mg/dL (ref 0.0–1.9)

## 2014-11-21 MED ORDER — INSULIN GLARGINE 100 UNIT/ML SOLOSTAR PEN: 17.0000 [IU] | PEN_INJECTOR | Freq: Every day | SUBCUTANEOUS | Status: DC

## 2014-11-21 NOTE — Patient Instructions (Addendum)
  Change tgt glucose to 110.  Continue current carb calculations at 1:15. Keep dietician appt.  Start low grade exercise like walking.  Set up with new PCP.  Change back to lantus 17 units [place] daily. Stop Toujeo.  Please come back for a follow-up appointment in 6 weeks

## 2014-11-21 NOTE — Progress Notes (Signed)
Reason for visit-  Karla Patton is a 63 y.o.-year-old female,  Here for management of likelyType 2 diabetes, uncontrolled, without complications. Last appt ~4 weeks ago.    HPI- Diagnosed with diabetes in 1984. Recalls being initially on lifestyle modifications and oral medications for about 18 months. Was on an insulin pump for 11 years- 2014 when A1c >10%, then moved back to basal/bolus insulin, because she felt that pump was not working well for her. Doesn't wish to go back to pump.  Then started on lantus and novolog pens- October 2015 switched to [facility] due to frequent night time lows. Feels that night time lows have decreased after this switch, but thinks that Toujeo is not working as it is leading to higher sugars.   Pt is currently on a regimen of:  - Toujeo 17 units qhs - Novolog per carb calcutions 1: 15 carbs, tgt glucose 150, ISF 50 units  qac TID I:C 1:20 at lunch time- didn't make this change since lats visit *Not complaining of insulin dripping out anymore *She had also been correcting for higher sugars >150 about 1-2 times daily with Novolog - most times in about 2 hours PP. Now reduced corrective bolus use, but still trying to correct high sugars in the morning. *Now trying to eat consistent meals as well.    Last hemoglobin A1c was: Lab Results  Component Value Date   HGBA1C 8.6* 10/03/2014     Pt checks her sugars 10x a day . Uses Bayer glucometer. By sugar log they are:  PREMEAL Breakfast Lunch Dinner Bedtime Overall  Glucose range: 211-314 290 198-200 198-339   Mean/median:        POST-MEAL PC Breakfast PC Lunch PC Dinner  Glucose range:     Mean/median:       Hypoglycemia-  Overall frequency has decreased significantly, especially after she cut back on alcohol intake.  No recent lows needing assistance. Prior was getting lows very frequently. Now able to sense them if they occur  Dietary habits- eats  Mainly 2 times daily-occasionally 3 times. Tries to  limit carbs, sweetened beverages. Has desserts- cheesecakes about 2 x weekly atleast. Celebrated a lot of birthdays recently and tried to eat better at Thanksgiving.  Exercise- no, plans to start treadmill Weight -  Reports gaining 40 lbs in past 5 years. Stable recently Wt Readings from Last 3 Encounters:  11/21/14 177 lb 12 oz (80.627 kg)  10/24/14 182 lb 8 oz (82.781 kg)  10/17/14 181 lb (82.101 kg)    Diabetes Complications-  Nephropathy- No  CKD, last BUN/creatinine- GFR 94. Urine MA/cr normal 04/2013 Lab Results  Component Value Date   BUN 8 10/03/2014   Lab Results  Component Value Date   CREATININE 0.7 10/03/2014  No results found for: GFR, MICRALBCREAT   Retinopathy- No, Last DEE was in May 2015 Neuropathy- occasional burning of her toes from her back pain. Takes lyrica for back. No numbness and tingling in her feet. No known neuropathy.  Associated history - No CAD . No prior stroke. Has hypothyroidism. her last TSH was  Lab Results  Component Value Date   TSH 1.51 10/03/2014  Being treated with Levothyroxine by PCP.   Hyperlipidemia-  her last set of lipids were- Currently on dietary therapy. Tolerating well.    Lab Results  Component Value Date   CHOL 218* 10/03/2014   HDL 74* 10/03/2014   LDLCALC 131 10/03/2014   TRIG 67 10/03/2014    Blood Pressure/HTN- Patient's  blood pressure is well controlled today.    I have reviewed the patient's past medical history, medications and allergies.    Current Outpatient Prescriptions on File Prior to Visit  Medication Sig Dispense Refill  . ALPRAZolam (XANAX) 0.5 MG tablet Take 0.5 mg by mouth as needed for sleep.    . AMITIZA 24 MCG capsule Take 1 capsule by mouth 2 (two) times daily with a meal.     . aspirin 81 MG tablet Take 162 mg by mouth daily.     [name] BAYER CONTOUR NEXT TEST test strip     . glucagon (GLUCAGEN) 1 MG SOLR injection Inject 1 mg into the muscle once as needed for low blood sugar. 1 mg 12  .  insulin aspart (NOVOLOG) 100 UNIT/ML injection Inject into the skin as needed for high blood sugar. Sliding scale.    . Insulin Glargine (TOUJEO SOLOSTAR [place]) Inject 16 Units into the skin daily.     [name] levothyroxine (SYNTHROID, LEVOTHROID) 100 MCG tablet Take 1 tablet by mouth daily.    [name] LYRICA 75 MG capsule Take 1 capsule by mouth 3 (three) times daily.     [name] torsemide (DEMADEX) 20 MG tablet Take 20 mg by mouth as needed.     No current facility-administered medications on file prior to visit.   No Known Allergies  Review of Systems- [ x ]  Complains of    [  ]  denies [  ] Recent weight change [  ]  Fatigue [  ] polydipsia [  ] polyuria [  ]  nocturia [  ]  vision difficulty [ x ] chest pain [  ] shortness of breath [ x ] leg swelling [  ] cough [  ] nausea/vomiting [  ] diarrhea [  ] constipation [  ] abdominal pain [ x ]  tingling/numbness in extremities [  ]  concern with feet ( wounds/sores) She has been having occasional chest pain midsternal without associated symptoms , sharp pain. Also noting some LUQ pain in the mornings that goes away after some time. No chest pain today. Has had prior normal stress test with Dr Ronnald Collum. States that her PCP is retiring this month.   PE: BP 130/82 mmHg  Pulse 68  Wt 177 lb 12 oz (80.627 kg)  SpO2 97% Wt Readings from Last 3 Encounters:  11/21/14 177 lb 12 oz (80.627 kg)  10/24/14 182 lb 8 oz (82.781 kg)  10/17/14 181 lb (82.101 kg)   Exam: deferred  ASSESSMENT AND PLAN: 1. Likely type 2 Diabetes, uncontrolled 2. Hypoglycemia  Problem List Items Addressed This Visit      Endocrine   Diabetic hypoglycemia    Overall reduced in frequency.  Asked her not to skip meals, eat at consistent times, avoid corrective boluses and work on accurate carb calculations. She is going to meet with a dietician later this month to work on carb calculations.  Also, cut back on alcohol intake and seems to be helping with reduction in hypoglycemia.          Relevant Orders      Microalbumin / creatinine urine ratio     Other   Type 2 diabetes mellitus, uncontrolled - Primary     Most recent A1c above goal. Decreased frequency of hypoglycemia since past visit.   Asked her not to give corrective boluses unless with rapid acting insulin, unless her sugars are >350 at bedtime at prior visits. She will work on  food log and bring that to the dietician to go over carb calculations in detail. I think this will help her figure out her insulin requirements better.   Change Toujeo to Lantus 17 units daily per patient preference.  Sugars are mostly high these days. Asked her to change the Tgt glucose back to 110 for bolus calculations.    Will check labs for DM etiology at next few visits.  LDL higher range- readdress at next few visits. Dietary therapy for now.  Update urine MA at this visit.   Set up with new PCP. RTC 6 weeks       Relevant Orders      Microalbumin / creatinine urine ratio        - Return to clinic in 6 weeks with sugar log/meter. Fasting labs at next visit. Set up with new PCP to discuss further cardiac workup in the next 1-2 weeks.    Kollins Fenter [facility] 11/21/2014 10:06 AM

## 2014-11-21 NOTE — Assessment & Plan Note (Signed)
  Most recent A1c above goal. Decreased frequency of hypoglycemia since past visit.   Asked her not to give corrective boluses unless with rapid acting insulin, unless her sugars are >350 at bedtime at prior visits. She will work on food log and bring that to the dietician to go over carb calculations in detail. I think this will help her figure out her insulin requirements better.   Change Toujeo to Lantus 17 units daily per patient preference.  Sugars are mostly high these days. Asked her to change the Tgt glucose back to 110 for bolus calculations.    Will check labs for DM etiology at next few visits.  LDL higher range- readdress at next few visits. Dietary therapy for now.  Update urine MA at this visit.   Set up with new PCP. RTC 6 weeks

## 2014-11-21 NOTE — Assessment & Plan Note (Signed)
Overall reduced in frequency.  Asked her not to skip meals, eat at consistent times, avoid corrective boluses and work on accurate carb calculations. She is going to meet with a dietician later this month to work on carb calculations.  Also, cut back on alcohol intake and seems to be helping with reduction in hypoglycemia.

## 2014-11-21 NOTE — Progress Notes (Signed)
Pre visit review using our clinic review tool, if applicable. No additional management support is needed unless otherwise documented below in the visit note. 

## 2014-11-30 ENCOUNTER — Ambulatory Visit: Payer: BC Managed Care – PPO | Admitting: Nurse Practitioner

## 2014-12-01 ENCOUNTER — Encounter: Payer: Self-pay | Admitting: Nurse Practitioner

## 2014-12-01 ENCOUNTER — Ambulatory Visit (INDEPENDENT_AMBULATORY_CARE_PROVIDER_SITE_OTHER): Payer: BC Managed Care – PPO | Admitting: Nurse Practitioner

## 2014-12-01 ENCOUNTER — Encounter: Payer: Self-pay | Admitting: *Deleted

## 2014-12-01 VITALS — BP 123/79 | HR 93 | Temp 98.1°F | Resp 12 | Ht 65.5 in | Wt 179.8 lb

## 2014-12-01 DIAGNOSIS — Z7189 Other specified counseling: Secondary | ICD-10-CM

## 2014-12-01 DIAGNOSIS — Z7689 Persons encountering health services in other specified circumstances: Secondary | ICD-10-CM

## 2014-12-01 NOTE — Patient Instructions (Signed)
Nice to meet you and welcome to [facility]!   Happy Holidays!   Call us if you need [place] otherwise we will see you when you need your annual physical.

## 2014-12-01 NOTE — Progress Notes (Signed)
Pre visit review using our clinic review tool, if applicable. No additional management support is needed unless otherwise documented below in the visit note. 

## 2014-12-01 NOTE — Progress Notes (Signed)
Subjective:    Patient ID: Karla Patton, female    DOB: 26-Aug-1951, 63 y.o.   MRN: [id number]  HPI Karla Patton is a 63 yo female here today to establish care.   1) Establishing care- Coming from Dr. Burt Ek due to retirement shortly.  Sees Dr. Howell Rucks for hypothyroidism and type II DM.   2) 1 episode chest pain- 1 ASA 325 mg resolved.  Happened before thanksgiving, no episodes since Pinching/chest "fullness". Right arm was hurting. Did not go to  ER. Husband was with patient and was trying to persuade her to go. She has felt fine ever since.   3) BS today 178 at 4 am after forgetting lantus  Checked before breakfast 399, Lantus 16 units with Breakfast about 8 am. Working with Dr. Howell Rucks.    Lab Results  Component Value Date   HGBA1C 8.6* 10/03/2014   4) Hypothyroidism. - No change to medication, takes on empty stomach daily,  Lab Results  Component Value Date   TSH 1.51 10/03/2014   5) Health Maintenance-   Diet- Cuts down on sweets, small amount of bread- whole wheat, bowel of grits    Exercise- No formal exercise  Immunizations- Will go through records brought today  Mammogram- up to date  Pap- Due for exam this year. Dr. Prentiss Bells at [facility] in past  Colonoscopy- [facility] due again 2019   Eye exam- Last May 2014, no DM retinopathy   Dental Exam- Once yearly   6) Acute Problems- none today   Denies needing refills   Review of Systems  Constitutional: Negative for fever, chills, diaphoresis, activity change, appetite change, fatigue and unexpected weight change.  HENT: Negative for dental problem, sinus pressure and trouble swallowing.   Eyes: Negative for visual disturbance.  Respiratory: Negative for apnea, cough, choking, chest tightness, shortness of breath, wheezing and stridor.   Cardiovascular: Negative for chest pain, palpitations and leg swelling.  Gastrointestinal: Negative for nausea, vomiting, abdominal pain, diarrhea,  constipation, blood in stool and abdominal distention.  Endocrine: Negative for cold intolerance, heat intolerance, polydipsia, polyphagia and polyuria.  Genitourinary: Negative for dysuria, vaginal discharge and vaginal pain.  Skin: Negative for rash and wound.  Neurological: Negative for dizziness, tremors, seizures, syncope, weakness and headaches.  Hematological: Negative for adenopathy. Does not bruise/bleed easily.  Psychiatric/Behavioral: Negative for suicidal ideas. The patient is not nervous/anxious.        Denies depression also   Past Medical History  Diagnosis Date  . Varicose vein   . Diabetes mellitus without complication   . Thyroid disease   . Back pain     History   Social History  . Marital Status: Divorced    Spouse Name: N/A    Number of Children: N/A  . Years of Education: N/A   Occupational History  . Not on file.   Social History Main Topics  . Smoking status: Never Smoker   . Smokeless tobacco: Not on file  . Alcohol Use: 4.2 oz/week    7 Glasses of wine per week  . Drug Use: No  . Sexual Activity:    Partners: Male    Birth Control/ Protection: None     Comment: Female partner 18 years   Other Topics Concern  . Not on file   Social History Narrative   Retired from [employer] as Engineer, maintenance   2 children [place] [phone], [place])   [place] 3 blood related   5  step-grandchildren    Enjoys going to beach and reading   No pets     Past Surgical History  Procedure Laterality Date  . Abdominal hysterectomy  1994  . Incontinence surgery  2003  . Colonoscopy  2009    normal  . Varicose vein surgery  1990's    Dr Bary Castilla  . Cholecystectomy  09-07-13  . Left leg vein ligation  2015    Family History  Problem Relation Age of Onset  . Diabetes Mother   . Heart disease Father   . Stroke Father   . Hypertension Father     No Known Allergies  Current Outpatient Prescriptions on File Prior to Visit  Medication Sig Dispense Refill    . ALPRAZolam (XANAX) 0.5 MG tablet Take 0.5 mg by mouth as needed for sleep.    . AMITIZA 24 MCG capsule Take 1 capsule by mouth daily.     [name] aspirin 81 MG tablet Take 162 mg by mouth daily.     [name] BAYER CONTOUR NEXT TEST test strip     . glucagon (GLUCAGEN) 1 MG SOLR injection Inject 1 mg into the muscle once as needed for low blood sugar. 1 mg 12  . insulin aspart (NOVOLOG) 100 UNIT/ML injection Inject into the skin as needed for high blood sugar. Sliding scale.    . Insulin Glargine (LANTUS SOLOSTAR) 100 UNIT/ML Solostar Pen Inject 17 Units into the skin daily at 10 pm. 5 pen 6  . levothyroxine (SYNTHROID, LEVOTHROID) 100 MCG tablet Take 1 tablet by mouth daily.    [name] LYRICA 75 MG capsule Take 1 capsule by mouth 3 (three) times daily.     [name] torsemide (DEMADEX) 20 MG tablet Take 20 mg by mouth as needed.     No current facility-administered medications on file prior to visit.       Objective:   Physical Exam  Constitutional: She is oriented to person, place, and time. She appears well-developed and well-nourished. No distress.  HENT:  Head: Normocephalic and atraumatic.  Right Ear: External ear normal.  Left Ear: External ear normal.  Mouth/Throat: No oropharyngeal exudate.  TM's clear bilat  Eyes: Conjunctivae and EOM are normal. Pupils are equal, round, and reactive to light. Right eye exhibits no discharge. Left eye exhibits no discharge. No scleral icterus.  Neck: Normal range of motion. Neck supple. No thyromegaly present.  Cardiovascular: Normal rate, regular rhythm, normal heart sounds and intact distal pulses.   Pulmonary/Chest: Breath sounds normal. No respiratory distress. She has no wheezes. She has no rales. She exhibits no tenderness.  Abdominal: Soft. Bowel sounds are normal. She exhibits no distension and no mass. There is no tenderness. There is no rebound and no guarding.  Musculoskeletal: Normal range of motion. She exhibits no edema or tenderness.  Lymphadenopathy:     She has no cervical adenopathy.  Neurological: She is alert and oriented to person, place, and time. She displays normal reflexes. No cranial nerve deficit. She exhibits normal muscle tone. Coordination normal.  Skin: Skin is warm and dry. No rash noted. She is not diaphoretic. No erythema.  Psychiatric: She has a normal mood and affect. Her behavior is normal. Judgment and thought content normal.      BP 123/79 mmHg  Pulse 93  Temp(Src) 98.1 F (36.7 C)  Resp 12  Ht 5' 5.5" (1.664 m)  Wt 179 lb 12.8 oz (81.557 kg)  BMI 29.45 kg/m2  SpO2 97%    Assessment & Plan:

## 2014-12-04 DIAGNOSIS — Z7189 Other specified counseling: Secondary | ICD-10-CM | POA: Insufficient documentation

## 2014-12-04 DIAGNOSIS — Z0000 Encounter for general adult medical examination without abnormal findings: Secondary | ICD-10-CM | POA: Insufficient documentation

## 2014-12-04 NOTE — Assessment & Plan Note (Addendum)
Pt brought medical records. Will abstract information and return to pt. Will see Dr. Howell Rucks 01/04/15.

## 2014-12-22 ENCOUNTER — Ambulatory Visit: Payer: Self-pay | Admitting: Endocrinology

## 2015-01-02 ENCOUNTER — Ambulatory Visit: Payer: BC Managed Care – PPO | Admitting: Endocrinology

## 2015-01-04 ENCOUNTER — Ambulatory Visit: Payer: BC Managed Care – PPO | Admitting: Endocrinology

## 2015-02-26 ENCOUNTER — Telehealth: Payer: Self-pay | Admitting: *Deleted

## 2015-02-26 ENCOUNTER — Other Ambulatory Visit (INDEPENDENT_AMBULATORY_CARE_PROVIDER_SITE_OTHER): Payer: BLUE CROSS/BLUE SHIELD

## 2015-02-26 DIAGNOSIS — Z139 Encounter for screening, unspecified: Secondary | ICD-10-CM

## 2015-02-26 LAB — LIPID PANEL
Cholesterol: 203 mg/dL — ABNORMAL HIGH (ref 0–200)
HDL: 72.4 mg/dL (ref >39.00)
LDL Cholesterol: 115 mg/dL — ABNORMAL HIGH (ref 0–99)
NonHDL: 130.6
Total CHOL/HDL Ratio: 3
Triglycerides: 77 mg/dL (ref 0.0–149.0)
VLDL: 15.4 mg/dL (ref 0.0–40.0)

## 2015-02-26 LAB — COMPREHENSIVE METABOLIC PANEL
ALT: 21 U/L (ref 0–35)
AST: 22 U/L (ref 0–37)
Albumin: 4.1 g/dL (ref 3.5–5.2)
Alkaline Phosphatase: 94 U/L (ref 39–117)
BUN: 8 mg/dL (ref 6–23)
CO2: 31 mEq/L (ref 19–32)
Calcium: 8.9 mg/dL (ref 8.4–10.5)
Chloride: 92 mEq/L — ABNORMAL LOW (ref 96–112)
Creatinine, Ser: 0.7 mg/dL (ref 0.40–1.20)
GFR: 90.01 mL/min (ref >60.00)
Glucose, Bld: 143 mg/dL — ABNORMAL HIGH (ref 70–99)
Potassium: 5 mEq/L (ref 3.5–5.1)
Sodium: 127 mEq/L — ABNORMAL LOW (ref 135–145)
Total Bilirubin: 0.4 mg/dL (ref 0.2–1.2)
Total Protein: 6.9 g/dL (ref 6.0–8.3)

## 2015-02-26 LAB — HEMOGLOBIN A1C: Hgb A1c MFr Bld: 8.6 % — ABNORMAL HIGH (ref 4.6–6.5)

## 2015-02-26 NOTE — Telephone Encounter (Signed)
I guess A1c and BMET

## 2015-02-26 NOTE — Telephone Encounter (Signed)
What labs and dx?  

## 2015-02-26 NOTE — Telephone Encounter (Signed)
Can you please put the orders in

## 2015-02-26 NOTE — Telephone Encounter (Signed)
Instead of BMET, could we get CMP, and if fasting then lipids, Cpep, GAD 65 antibody, islet cell Ab. Also A1c please. thanks

## 2015-02-26 NOTE — Telephone Encounter (Signed)
Sorry i only collected the standard tube (since the ordered were not place) so the cpep gad 65, and islet cell anitbody can not be done, but the rest yes

## 2015-02-26 NOTE — Telephone Encounter (Signed)
Okay, thanks

## 2015-03-01 ENCOUNTER — Encounter: Payer: Self-pay | Admitting: Endocrinology

## 2015-03-01 ENCOUNTER — Ambulatory Visit (INDEPENDENT_AMBULATORY_CARE_PROVIDER_SITE_OTHER): Payer: BLUE CROSS/BLUE SHIELD | Admitting: Endocrinology

## 2015-03-01 VITALS — BP 118/74 | HR 70 | Temp 98.2°F | Resp 16 | Wt 185.0 lb

## 2015-03-01 DIAGNOSIS — IMO0002 Reserved for concepts with insufficient information to code with codable children: Secondary | ICD-10-CM

## 2015-03-01 DIAGNOSIS — E11649 Type 2 diabetes mellitus with hypoglycemia without coma: Secondary | ICD-10-CM

## 2015-03-01 DIAGNOSIS — E1165 Type 2 diabetes mellitus with hyperglycemia: Secondary | ICD-10-CM

## 2015-03-01 LAB — BASIC METABOLIC PANEL
BUN: 11 mg/dL (ref 6–23)
CO2: 33 mEq/L — ABNORMAL HIGH (ref 19–32)
Calcium: 9.3 mg/dL (ref 8.4–10.5)
Chloride: 94 mEq/L — ABNORMAL LOW (ref 96–112)
Creatinine, Ser: 0.69 mg/dL (ref 0.40–1.20)
GFR: 91.51 mL/min (ref >60.00)
Glucose, Bld: 123 mg/dL — ABNORMAL HIGH (ref 70–99)
Potassium: 4.2 mEq/L (ref 3.5–5.1)
Sodium: 129 mEq/L — ABNORMAL LOW (ref 135–145)

## 2015-03-01 NOTE — Progress Notes (Signed)
Pre visit review using our clinic review tool, if applicable. No additional management support is needed unless otherwise documented below in the visit note. 

## 2015-03-01 NOTE — Patient Instructions (Signed)
Change tgt glucose to 120.  Continue other insulin doses.  Please come back for a follow-up appointment in 1 month.

## 2015-03-01 NOTE — Assessment & Plan Note (Signed)
Overall reduced in frequency.  Asked her not to skip meals, eat at consistent times, avoid corrective boluses and work on accurate carb calculations. She met with a dietician since past visit to work on carb calculations- but this wasn't that helpful.  Also, cut back on alcohol intake and seems to be helping with reduction in hypoglycemia.  Still drinking wine and seeing some hypos at night time

## 2015-03-01 NOTE — Assessment & Plan Note (Signed)
  Most recent A1c above goal. Decreased frequency of hypoglycemia since past visit.   Asked her not to give corrective boluses unless with rapid acting insulin, unless her sugars are >350 at bedtime at prior visits. Very few readings to make any insulin adjustments today. Recent A1c still elevated.  Repeat BMP today given recent hypoNa.  Continue current Lantus 17 units daily. She wanted to know  whether switching to BID regimen for lantus might help with decreasing the lows, and a trial can be done with 9 units am and 8 units supper, however I think her hypoglycemia is related to her wine intake.   Asked her to change the Tgt glucose back to 120 for bolus calculations.    Will check labs for DM etiology at next few visits.  LDL higher range- readdress at next few visits. Dietary therapy for now. She doesn't wish to try statins at this time.

## 2015-03-01 NOTE — Progress Notes (Signed)
Reason for visit-  Karla Patton is a 64 y.o.-year-old female,  Here for management of likelyType 2 diabetes, uncontrolled, without complications. Last appt Dec 2015.    HPI- Diagnosed with diabetes in 1984. Recalls being initially on lifestyle modifications and oral medications for about 18 months. Was on an insulin pump for 11 years- 2014 when A1c >10%, then moved back to basal/bolus insulin, because she felt that pump was not working well for her. Doesn't wish to go back to pump.  Then started on lantus and novolog pens- October 2015 switched to [facility] due to frequent night time lows. Feels that night time lows have decreased after this switch, but thinks that Toujeo is not working as it is leading to higher sugars.   *Changed from Toujeo to lantus per pt preference Dec 2015. Likes lantus better. Likes syringes better than pens.   Pt is currently on a regimen of:  - Lantus 17 units qhs - Novolog per carb calcutions 1: 15 carbs, tgt glucose 150, ISF 50 units  qac TID>> * didn't changed tgt glucose to 110 as instructed Earlier had attempted to change I:C at lunch to 20, but she didn't do that either *Not complaining of insulin dripping out anymore with the syringe *She had also been correcting for higher sugars >150 about 1-2 times daily with Novolog - most times in about 2 hours PP. Now reduced corrective bolus use, but still trying to correct high sugars in the morning. *Now trying to eat consistent meals as well.    Last hemoglobin A1c was: Lab Results  Component Value Date   HGBA1C 8.6* 02/26/2015   HGBA1C 8.6* 10/03/2014     Pt checks her sugars 10x a day . Uses Bayer glucometer. By sugar log they are few readings as this is her secondary meter:  PREMEAL Breakfast Lunch Dinner Bedtime Overall  Glucose range: 78-220  306 56-303   Mean/median:        POST-MEAL PC Breakfast PC Lunch PC Dinner  Glucose range:     Mean/median:       Hypoglycemia-  Overall frequency has  decreased significantly, especially after she cut back on alcohol intake. No taking 2-4 glasses of red wine at night time No recent lows needing assistance. Prior was getting lows very frequently. Now able to sense them if they occur  Dietary habits- eats  Mainly 2 times daily-occasionally 3 times. Tries to limit carbs, sweetened beverages. Has desserts- cheesecakes about 2 x weekly atleast. Didn't eat that well during the holidays Exercise- no, plans to start treadmill Weight -  Reports gaining 40 lbs in past 5 years.  Wt Readings from Last 3 Encounters:  03/01/15 185 lb (83.915 kg)  12/01/14 179 lb 12.8 oz (81.557 kg)  11/21/14 177 lb 12 oz (80.627 kg)    Diabetes Complications-  Nephropathy- No  CKD, last BUN/creatinine- GFR 94.  Lab Results  Component Value Date   BUN 8 02/26/2015   Lab Results  Component Value Date   CREATININE 0.70 02/26/2015   Lab Results  Component Value Date   GFR 90.01 02/26/2015   MICRALBCREAT 1.0 11/21/2014     Retinopathy- No, Last DEE was in May 2015 Neuropathy- occasional burning of her toes from her back pain. Takes lyrica for back. No numbness and tingling in her feet. No known neuropathy.  Associated history - No CAD . No prior stroke. Has hypothyroidism. her last TSH was  Lab Results  Component Value Date   TSH 1.51  10/03/2014  Being treated with Levothyroxine by PCP.   Hyperlipidemia-  her last set of lipids were- Currently on dietary therapy. Tolerating well.    Lab Results  Component Value Date   CHOL 203* 02/26/2015   HDL 72.40 02/26/2015   LDLCALC 115* 02/26/2015   TRIG 77.0 02/26/2015   CHOLHDL 3 02/26/2015    Blood Pressure/HTN- Patient's blood pressure is well controlled today.    I have reviewed the patient's past medical history, medications and allergies.    Current Outpatient Prescriptions on File Prior to Visit  Medication Sig Dispense Refill  . ALPRAZolam (XANAX) 0.5 MG tablet Take 0.5 mg by mouth as needed for  sleep.    . AMITIZA 24 MCG capsule Take 1 capsule by mouth daily.     [name] aspirin 81 MG tablet Take 162 mg by mouth daily.     [name] BAYER CONTOUR NEXT TEST test strip     . glucagon (GLUCAGEN) 1 MG SOLR injection Inject 1 mg into the muscle once as needed for low blood sugar. 1 mg 12  . insulin aspart (NOVOLOG) 100 UNIT/ML injection Inject into the skin as needed for high blood sugar. Sliding scale.    . Insulin Glargine (LANTUS SOLOSTAR) 100 UNIT/ML Solostar Pen Inject 17 Units into the skin daily at 10 pm. 5 pen 6  . levothyroxine (SYNTHROID, LEVOTHROID) 100 MCG tablet Take 1 tablet by mouth daily.    [name] LYRICA 75 MG capsule Take 1 capsule by mouth 3 (three) times daily.     [name] torsemide (DEMADEX) 20 MG tablet Take 20 mg by mouth as needed.     No current facility-administered medications on file prior to visit.   No Known Allergies  Review of Systems- [ x ]  Complains of    [  ]  denies [  ] Recent weight change [  ]  Fatigue [  ] polydipsia [  ] polyuria [  ]  nocturia [  ]  vision difficulty [ x ] chest pain [  ] shortness of breath [  ] leg swelling [  ] cough [  ] nausea/vomiting [  ] diarrhea [  ] constipation [  ] abdominal pain [  ]  tingling/numbness in extremities [  ]  concern with feet ( wounds/sores) No more chest pressure sensation since past visit  PE: BP 118/74 mmHg  Pulse 70  Temp(Src) 98.2 F (36.8 C) (Oral)  Resp 16  Wt 185 lb (83.915 kg)  SpO2 99% Wt Readings from Last 3 Encounters:  03/01/15 185 lb (83.915 kg)  12/01/14 179 lb 12.8 oz (81.557 kg)  11/21/14 177 lb 12 oz (80.627 kg)   Exam: deferred  ASSESSMENT AND PLAN: 1. Likely type 2 Diabetes, uncontrolled 2. Hypoglycemia  Problem List Items Addressed This Visit      Endocrine   Diabetic hypoglycemia    Overall reduced in frequency.  Asked her not to skip meals, eat at consistent times, avoid corrective boluses and work on accurate carb calculations. She met with a dietician since past visit to work on  carb calculations- but this wasn't that helpful.  Also, cut back on alcohol intake and seems to be helping with reduction in hypoglycemia.  Still drinking wine and seeing some hypos at night time             Other   Type 2 diabetes mellitus, uncontrolled - Primary     Most recent A1c above goal. Decreased frequency of hypoglycemia  since past visit.   Asked her not to give corrective boluses unless with rapid acting insulin, unless her sugars are >350 at bedtime at prior visits. Very few readings to make any insulin adjustments today. Recent A1c still elevated.  Repeat BMP today given recent hypoNa.  Continue current Lantus 17 units daily. She wanted to know  whether switching to BID regimen for lantus might help with decreasing the lows, and a trial can be done with 9 units am and 8 units supper, however I think her hypoglycemia is related to her wine intake.   Asked her to change the Tgt glucose back to 120 for bolus calculations.    Will check labs for DM etiology at next few visits.  LDL higher range- readdress at next few visits. Dietary therapy for now. She doesn't wish to try statins at this time.               Relevant Orders   Basic metabolic panel        - Return to clinic in 1 month with sugar log/meter.   Ulices Maack [facility] 03/01/2015 9:48 AM

## 2015-03-13 ENCOUNTER — Ambulatory Visit: Payer: Self-pay | Admitting: General Practice

## 2015-04-06 LAB — LIPID PANEL
Cholesterol: 202 mg/dL — AB (ref 0–200)
HDL: 71 mg/dL — AB (ref 35–70)
LDL Cholesterol: 115 mg/dL
Triglycerides: 78 mg/dL (ref 40–160)

## 2015-04-06 LAB — BASIC METABOLIC PANEL
BUN: 9 mg/dL (ref 4–21)
Creatinine: 0.8 mg/dL (ref 0.5–1.1)
Glucose: 365 mg/dL
Potassium: 4.9 mmol/L (ref 3.4–5.3)
Sodium: 131 mmol/L — AB (ref 137–147)

## 2015-04-06 LAB — CBC AND DIFFERENTIAL
HCT: 36 % (ref 36–46)
Hemoglobin: 11.8 g/dL — AB (ref 12.0–16.0)
Platelets: 374 10*3/uL (ref 150–399)
WBC: 3.8 10^3/mL

## 2015-04-06 LAB — TSH: TSH: 2.3 u[IU]/mL (ref 0.41–5.90)

## 2015-04-06 LAB — HEPATIC FUNCTION PANEL
ALT: 21 U/L (ref 7–35)
AST: 27 U/L (ref 13–35)
Alkaline Phosphatase: 90 U/L (ref 25–125)
Bilirubin, Total: 0.3 mg/dL

## 2015-04-06 LAB — HEMOGLOBIN A1C: Hgb A1c MFr Bld: 8.3 % — AB (ref 4.0–6.0)

## 2015-04-09 ENCOUNTER — Telehealth: Payer: Self-pay | Admitting: *Deleted

## 2015-04-09 NOTE — Telephone Encounter (Signed)
Pt left VM, stating she had discussed with Dr. Howell Rucks at last appt, wanting to know if she had any pancreatic function. Looking back in chart, those labs were not drawn. Pt was asking for results. I can tell her that they weren't collected. Do you want them drawn before her next appt 04/25/15 or is she ok to wait and have checked then? States I can send mychart msg with response.

## 2015-04-10 NOTE — Telephone Encounter (Signed)
Sent mychart

## 2015-04-10 NOTE — Telephone Encounter (Signed)
If she is all right with waiting till next appointment- that is fine with me- otherwise she can come in for fasting Cpep, insulin, GAD-65 Ab, islet cell Antibodies. thanks

## 2015-04-13 NOTE — Op Note (Signed)
PATIENT NAME:  Karla Patton, Karla Patton MR#:  [id number] DATE OF BIRTH:  Aug 08, 1951  DATE OF OPERATION:  09/07/2013  PREOPERATIVE DIAGNOSIS: Chronic cholecystitis.   POSTOPERATIVE DIAGNOSIS: Chronic cholecystitis.   OPERATIVE PROCEDURE: Laparoscopic cholecystectomy, with intraoperative cholangiograms.   OPERATING SURGEY: Hervey Ard.   ANESTHESIA: General endotracheal, under Dr. Ronelle Nigh.   ESTIMATED BLOOD LOSS: Less than 5 mL.   CLINICAL NOTE: This 64 year old woman has had a 14-month history of right upper quadrant pain, typically in the postprandial period. She has found fatty foods to be particularly irritating. Ultrasound was negative. HIDA scanning showed very delayed filling of the gallbladder,  although a normal ejection fraction. Her clinical exam showed tenderness in the right upper quadrant. She was felt to be a candidate for a cholecystectomy.   OPERATIVE NOTE: The patient received Kefzol 2 grams intravenously due to her insulin-dependent diabetes. The abdomen was prepped with ChloraPrep and draped there. She tolerated general endotracheal anesthesia well. In Trendelenburg position, a Veress needle was placed through a transumbilical incision. After assuring intraabdominal location with the hanging-drop test, the abdomen was insufflated with CO2 at 10 mmHg pressure. She was placed in reverse Trendelenburg position after inspection showed no evidence of injury from initial port placement.   An 11 mm Xcel port was placed in the epigastrium and two 5-mm step ports placed laterally. There was noted to be extensive adhesions of the omentum to the surface of the gallbladder. It had lost its typical "robin's egg blue" color with mild enlargement of the cystic duct lymph node. The area was cleared and fluoroscopic cholangiograms were completed using 14 mL of one-half strength Conray 60. This showed prompt filling of the right and left hepatic ducts and free flow into the duodenum. The cystic duct and  cystic artery were doubly-clipped and divided. The gallbladder was then removed from the liver bed making use of hook cautery dissection. It was delivered through the umbilical port site without incident.   Inspection of the open gallbladder showed a 6 mm nodular area at the very fundus of the gallbladder. This may represent an atypical Phrygian cap. The area was marked for pathologic extension. After re-establishing pneumoperitoneum, inspection of the abdomen was notable for a few adhesions of the right colon near the cecum to the anterior abdominal wall. No other intra-abdominal adhesions were noted. The right upper quadrant was irrigated with lactated Ringer's solution. The abdomen was then desufflated and ports removed under direct vision. Skin incisions were closed with 4-0 Vicryl subcuticular sutures. Benzoin, Steri-Strips, Telfa, and Tegaderm dressings were applied.   The patient tolerated the procedure well and was taken to the recovery room in stable condition.     ____________________________ Robert Bellow, MD jwb:dm D: 09/07/2013 13:48:18 ET T: 09/07/2013 14:09:18 ET JOB#: [id number]  cc: Robert Bellow, MD, <Dictator> Nelda Severe. Burt Ek, MD Lavella Myren Amedeo Kinsman MD ELECTRONICALLY SIGNED 09/07/2013 21:54

## 2015-04-25 ENCOUNTER — Ambulatory Visit: Payer: BLUE CROSS/BLUE SHIELD | Admitting: Endocrinology

## 2015-05-01 ENCOUNTER — Other Ambulatory Visit: Payer: Self-pay | Admitting: *Deleted

## 2015-05-01 ENCOUNTER — Other Ambulatory Visit: Payer: Self-pay | Admitting: Endocrinology

## 2015-05-01 ENCOUNTER — Other Ambulatory Visit (INDEPENDENT_AMBULATORY_CARE_PROVIDER_SITE_OTHER): Payer: BLUE CROSS/BLUE SHIELD

## 2015-05-01 ENCOUNTER — Telehealth: Payer: Self-pay | Admitting: *Deleted

## 2015-05-01 DIAGNOSIS — IMO0002 Reserved for concepts with insufficient information to code with codable children: Secondary | ICD-10-CM

## 2015-05-01 DIAGNOSIS — E1165 Type 2 diabetes mellitus with hyperglycemia: Secondary | ICD-10-CM

## 2015-05-01 DIAGNOSIS — Z139 Encounter for screening, unspecified: Secondary | ICD-10-CM

## 2015-05-01 LAB — GLUCOSE, RANDOM: Glucose, Bld: 123 mg/dL — ABNORMAL HIGH (ref 70–99)

## 2015-05-01 NOTE — Telephone Encounter (Signed)
Can you please put orders in i dont want to order the wrong thing

## 2015-05-01 NOTE — Telephone Encounter (Signed)
i entered them this morning, sorry forgot to send this message back to you.

## 2015-05-02 LAB — INSULIN AND C-PEPTIDE, SERUM
C-Peptide: <0.1 ng/mL — ABNORMAL LOW (ref 1.1–4.4)
INSULIN: 2.6 u[IU]/mL (ref 2.6–24.9)

## 2015-05-03 ENCOUNTER — Encounter: Payer: Self-pay | Admitting: *Deleted

## 2015-05-03 ENCOUNTER — Ambulatory Visit (INDEPENDENT_AMBULATORY_CARE_PROVIDER_SITE_OTHER): Payer: BLUE CROSS/BLUE SHIELD | Admitting: Endocrinology

## 2015-05-03 ENCOUNTER — Encounter: Payer: Self-pay | Admitting: Endocrinology

## 2015-05-03 VITALS — BP 122/82 | HR 77 | Resp 14 | Ht 65.5 in | Wt 183.2 lb

## 2015-05-03 DIAGNOSIS — E1165 Type 2 diabetes mellitus with hyperglycemia: Secondary | ICD-10-CM

## 2015-05-03 DIAGNOSIS — IMO0002 Reserved for concepts with insufficient information to code with codable children: Secondary | ICD-10-CM

## 2015-05-03 DIAGNOSIS — E11649 Type 2 diabetes mellitus with hypoglycemia without coma: Secondary | ICD-10-CM | POA: Diagnosis not present

## 2015-05-03 NOTE — Progress Notes (Signed)
Reason for visit-  Karla Patton is a 64 y.o.-year-old female,  Here for management of likelyType 2 diabetes, uncontrolled, without complications. Last appt March 2016.    HPI- Diagnosed with diabetes in 1984. Recalls being initially on lifestyle modifications and oral medications for about 18 months. Was on an insulin pump for 11 years- 2014 when A1c >10%, then moved back to basal/bolus insulin, because she felt that pump was not working well for her. Doesn't wish to go back to pump.  Then started on lantus and novolog pens- October 2015 switched to [facility] due to frequent night time lows. Feels that night time lows have decreased after this switch, but thinks that Toujeo is not working as it is leading to higher sugars.   *Changed from Toujeo to lantus per pt preference Dec 2015. Likes lantus better. Likes syringes better than pens.   Pt is currently on a regimen of:  - Lantus 18 units qhs - Novolog per carb calcutions 1: 15 carbs, tgt glucose 120, ISF 50 units  qac TID>>  *Not complaining of insulin dripping out anymore with the syringe    Last hemoglobin A1c was: Lab Results  Component Value Date   HGBA1C 8.3* 04/06/2015   HGBA1C 8.6* 02/26/2015   HGBA1C 8.6* 10/03/2014     Pt checks her sugars 10x a day . Uses Bayer glucometer. By sugar log they are few readings as this is her secondary meter: * very variable patterns between days likely related to lifestyle- not always entering premeal carbs for bolusing. Sometimes continues to give bolus as correction doses.  *occasionally has just checked once daily and then on some days checks quite frequently  PREMEAL Breakfast Lunch Dinner Bedtime Overall  Glucose range: 56-347 135-396  43-302   Mean/median:        POST-MEAL PC Breakfast PC Lunch PC Dinner  Glucose range:     Mean/median:       Hypoglycemia-  Overall frequency has decreased significantly, especially after she cut back on alcohol intake. No taking 2-4 glasses  of red wine at night time No recent lows needing assistance. Prior was getting lows very frequently. Now able to sense them if they occur  Dietary habits- eats  Mainly 2 times daily-occasionally 3 times. Tries to limit carbs, sweetened beverages. Has desserts- cheesecakes about 2 x weekly atleast. Going on vacation next week. Exercise- no, plans to start treadmill Weight -  Reports gaining 40 lbs in past 5 years.  Wt Readings from Last 3 Encounters:  05/03/15 183 lb 4 oz (83.122 kg)  03/01/15 185 lb (83.915 kg)  12/01/14 179 lb 12.8 oz (81.557 kg)    Diabetes Complications-  Nephropathy- No  CKD, last BUN/creatinine- GFR 94.  Lab Results  Component Value Date   BUN 9 04/06/2015   Lab Results  Component Value Date   CREATININE 0.8 04/06/2015   Lab Results  Component Value Date   GFR 91.51 03/01/2015   MICRALBCREAT 1.0 11/21/2014     Retinopathy- No, Last DEE was in May 2015>>next  appt nextTuesday Neuropathy- occasional burning of her toes from her back pain. Takes lyrica for back. +numbness and tingling in her feet. No known neuropathy.  Associated history - No CAD . No prior stroke. Has hypothyroidism. her last TSH was  Lab Results  Component Value Date   TSH 2.30 04/06/2015  Being treated with Levothyroxine by PCP.   Hyperlipidemia-  her last set of lipids were- Currently on dietary therapy. Tolerating well.  Lab Results  Component Value Date   CHOL 202* 04/06/2015   HDL 71* 04/06/2015   LDLCALC 115 04/06/2015   TRIG 78 04/06/2015   CHOLHDL 3 02/26/2015    Blood Pressure/HTN- Patient's blood pressure is well controlled today.    I have reviewed the patient's past medical history, medications and allergies.    Current Outpatient Prescriptions on File Prior to Visit  Medication Sig Dispense Refill  . ALPRAZolam (XANAX) 0.5 MG tablet Take 0.5 mg by mouth as needed for sleep.    . AMITIZA 24 MCG capsule Take 1 capsule by mouth daily.     [name] aspirin 81 MG  tablet Take 162 mg by mouth daily.     [name] BAYER CONTOUR NEXT TEST test strip     . glucagon (GLUCAGEN) 1 MG SOLR injection Inject 1 mg into the muscle once as needed for low blood sugar. 1 mg 12  . insulin aspart (NOVOLOG) 100 UNIT/ML injection Inject into the skin as needed for high blood sugar. Sliding scale.    . Insulin Glargine (LANTUS SOLOSTAR) 100 UNIT/ML Solostar Pen Inject 17 Units into the skin daily at 10 pm. (Patient taking differently: Inject 18 Units into the skin daily at 10 pm. ) 5 pen 6  . levothyroxine (SYNTHROID, LEVOTHROID) 100 MCG tablet Take 1 tablet by mouth daily.    [name] LYRICA 75 MG capsule Take 1 capsule by mouth 3 (three) times daily.     [name] torsemide (DEMADEX) 20 MG tablet Take 20 mg by mouth as needed.     No current facility-administered medications on file prior to visit.   No Known Allergies  Review of Systems- [ x ]  Complains of    [  ]  denies [  ] Recent weight change [ x ]  Fatigue [  ] polydipsia [  ] polyuria [  ]  nocturia [ x ]  vision difficulty [ x ] chest pain [  ] shortness of breath [ x ] leg swelling [  ] cough [  ] nausea/vomiting [  ] diarrhea [  ] constipation [  ] abdominal pain [  x]  tingling/numbness in extremities [  ]  concern with feet ( wounds/sores) 2 more episodes of chest pressure sensation since past visit, PCP aware, Dr Burt Ek retiring now and she is going transfer care to [facility]  PE: BP 122/82 mmHg  Pulse 77  Resp 14  Ht 5' 5.5" (1.664 m)  Wt 183 lb 4 oz (83.122 kg)  BMI 30.02 kg/m2  SpO2 96% Wt Readings from Last 3 Encounters:  05/03/15 183 lb 4 oz (83.122 kg)  03/01/15 185 lb (83.915 kg)  12/01/14 179 lb 12.8 oz (81.557 kg)   Exam: deferred  ASSESSMENT AND PLAN: 1. Likely type 2 Diabetes, uncontrolled 2. Hypoglycemia  Problem List Items Addressed This Visit      Endocrine   Diabetic hypoglycemia    Overall reduced in frequency.  Asked her not to skip meals, eat at consistent times, avoid corrective  boluses and work on accurate carb calculations.  Also, cut back on alcohol intake and seems to be helping with reduction in hypoglycemia.  Still drinking wine and seeing some hypos                Other   Type 2 diabetes mellitus, uncontrolled - Primary     Most recent A1c above goal. Decreased frequency of hypoglycemia. Update A1c next visit. ( had interim labs done  with PCP in April 2016, which were reviewed with her) Recent labs are pending for etiology of diabetes.  She has been checking her sugars at variable frequency- and I have encouraged her to check sugars 6 times daily regularly.  Try changing lantus to 9 units twice daily to see if night time numbers get better.  Enter pre meal sugars for all boluses.  Send me data on her sugars vis fax in 1 week for further adjustments.    LDL higher range- readdress at next few visits. Dietary therapy for now. She doesn't wish to try statins at this time.                      - Return to clinic in 1 month with sugar log/meter.   Phinneas Shakoor [facility] 05/04/2015 9:09 AM

## 2015-05-03 NOTE — Patient Instructions (Signed)
lantus 9 units twice daily Continue current Novolog for meal Use carb calcutor at each meal based on premeal sugars Send me data in 1 week for further adjustments  Please come back for a follow-up appointment in 1 month.

## 2015-05-03 NOTE — Progress Notes (Signed)
Pre visit review using our clinic review tool, if applicable. No additional management support is needed unless otherwise documented below in the visit note. 

## 2015-05-04 NOTE — Assessment & Plan Note (Signed)
Overall reduced in frequency.  Asked her not to skip meals, eat at consistent times, avoid corrective boluses and work on accurate carb calculations.  Also, cut back on alcohol intake and seems to be helping with reduction in hypoglycemia.  Still drinking wine and seeing some hypos

## 2015-05-04 NOTE — Assessment & Plan Note (Signed)
  Most recent A1c above goal. Decreased frequency of hypoglycemia. Update A1c next visit. ( had interim labs done with PCP in April 2016, which were reviewed with her) Recent labs are pending for etiology of diabetes.  She has been checking her sugars at variable frequency- and I have encouraged her to check sugars 6 times daily regularly.  Try changing lantus to 9 units twice daily to see if night time numbers get better.  Enter pre meal sugars for all boluses.  Send me data on her sugars vis fax in 1 week for further adjustments.    LDL higher range- readdress at next few visits. Dietary therapy for now. She doesn't wish to try statins at this time.

## 2015-05-08 LAB — ANTI-ISLET CELL ANTIBODY: Pancreatic Islet Cell Antibody: <5 JDF Units (ref <5)

## 2015-05-08 LAB — HM DIABETES EYE EXAM

## 2015-05-10 ENCOUNTER — Encounter: Payer: Self-pay | Admitting: *Deleted

## 2015-05-11 LAB — GLUTAMIC ACID DECARBOXYLASE AUTO ABS: Glutamic Acid Decarb Ab: >250 IU/mL — ABNORMAL HIGH

## 2015-05-22 ENCOUNTER — Encounter: Payer: Self-pay | Admitting: Endocrinology

## 2015-05-22 DIAGNOSIS — E113299 Type 2 diabetes mellitus with mild nonproliferative diabetic retinopathy without macular edema, unspecified eye: Secondary | ICD-10-CM | POA: Insufficient documentation

## 2015-06-08 ENCOUNTER — Telehealth: Payer: Self-pay | Admitting: *Deleted

## 2015-06-08 NOTE — Telephone Encounter (Signed)
I don't need any labs either at this time. thanks

## 2015-06-08 NOTE — Telephone Encounter (Signed)
Pt coming in for labs on 06.20.2016 what labs and dx? 

## 2015-06-08 NOTE — Telephone Encounter (Signed)
I did not want anything, did you want labs on V. Beth?

## 2015-06-08 NOTE — Telephone Encounter (Signed)
Spoke to pt about not needing to come to lab appointment, pt wasn't sure if she was a Karla Patton pt or not, told her she was a pt of Karla Patton. She said at her next appointment she would like to discuss about seeing another endocrinologist once dr.phadke leaves

## 2015-06-11 ENCOUNTER — Other Ambulatory Visit: Payer: BLUE CROSS/BLUE SHIELD

## 2015-06-14 ENCOUNTER — Ambulatory Visit: Payer: BLUE CROSS/BLUE SHIELD | Admitting: Endocrinology

## 2015-09-08 ENCOUNTER — Other Ambulatory Visit: Payer: Self-pay | Admitting: Physician Assistant

## 2015-09-08 DIAGNOSIS — F419 Anxiety disorder, unspecified: Secondary | ICD-10-CM

## 2015-09-10 ENCOUNTER — Telehealth: Payer: Self-pay | Admitting: Physician Assistant

## 2015-09-10 NOTE — Telephone Encounter (Signed)
Patient advised as directed below. Patient also stated that will schedule an appointment for an OV for another day tomorrow for Hgb A1C and wants to see Tawanna Sat when she comes to pick up the Rx.  Thanks,  Pernella Ackerley

## 2015-09-10 NOTE — Telephone Encounter (Signed)
Please notify patient that xanax Rx is available for pick up at the front desk.  Thank you.

## 2015-09-26 ENCOUNTER — Encounter: Payer: Self-pay | Admitting: General Surgery

## 2015-09-26 ENCOUNTER — Ambulatory Visit (INDEPENDENT_AMBULATORY_CARE_PROVIDER_SITE_OTHER): Payer: BLUE CROSS/BLUE SHIELD | Admitting: General Surgery

## 2015-09-26 VITALS — BP 120/80 | HR 74 | Resp 12 | Ht 65.0 in | Wt 179.0 lb

## 2015-09-26 DIAGNOSIS — I868 Varicose veins of other specified sites: Secondary | ICD-10-CM | POA: Diagnosis not present

## 2015-09-26 DIAGNOSIS — I8390 Asymptomatic varicose veins of unspecified lower extremity: Secondary | ICD-10-CM

## 2015-09-26 NOTE — Patient Instructions (Addendum)
Clot prevention: Continue using compression stockings, especially on long car trips, plane trips, etc. Return in 1 year.

## 2015-09-26 NOTE — Progress Notes (Signed)
This is a 64 year old female here today for her one year check with varicose veins. Patient states noticed a new area bulging up on her left leg. She had her flu shot earlier this week.  Heart: Regular rate and rhythm, no m/r/g. Lungs: Clear to auscultation bilaterally. Extremities: Residual minimal varicosities noted left lower extremity. Incision at left medial groin from previous ligation of GSV healed well. No edema or stasis skin changes. Dorsalis pedis and posterior tibial pulses intact bilaterally.  Discussed clot prevention: Continue using compression stockings, especially on long car trips, plane trips, etc.  Return in 1 year.

## 2015-10-25 ENCOUNTER — Other Ambulatory Visit: Payer: Self-pay | Admitting: Physician Assistant

## 2015-11-04 ENCOUNTER — Other Ambulatory Visit: Payer: Self-pay | Admitting: Physician Assistant

## 2015-11-06 ENCOUNTER — Other Ambulatory Visit: Payer: Self-pay | Admitting: Physician Assistant

## 2015-11-06 MED ORDER — PREGABALIN 75 MG PO CAPS: 75.0000 mg | ORAL_CAPSULE | Freq: Three times a day (TID) | ORAL | Status: DC

## 2015-11-06 NOTE — Progress Notes (Signed)
Please call in Lyrica 75 mg. 3 tabs PO at bedtime. #90 refills 5

## 2015-11-07 NOTE — Progress Notes (Signed)
Prescription called into pharmacy. The pharmacist stated only 5 refills are allowed. Just FYI since on the prescription it was asking for 11.  Thanks,  -Desani Sprung

## 2016-01-02 ENCOUNTER — Telehealth: Payer: Self-pay | Admitting: Physician Assistant

## 2016-01-02 NOTE — Telephone Encounter (Signed)
Pt called saying she was coming in 1/18 but wants to know what labs she needs to get done Friday from [facility].   Will someone call her back and let her know which test to have done.  Her call back is [phone]  [facility]

## 2016-01-02 NOTE — Telephone Encounter (Signed)
They can do an executive panel if they want to make it easy (she will know what I mean) or if they want individual TSH, HgBA1c, CBC and CMP. Thanks.

## 2016-01-03 NOTE — Telephone Encounter (Signed)
LMTCB  Thanks,  -Joseline 

## 2016-01-03 NOTE — Telephone Encounter (Signed)
Pt returned call. Thanks TNP °

## 2016-01-03 NOTE — Telephone Encounter (Signed)
Pt called back to ask what labs she needed. I advised her below b/c she stated she needed to know b/c she was going to get her labs done. Thanks TNP

## 2016-01-09 ENCOUNTER — Ambulatory Visit (INDEPENDENT_AMBULATORY_CARE_PROVIDER_SITE_OTHER): Payer: BLUE CROSS/BLUE SHIELD | Admitting: Physician Assistant

## 2016-01-09 ENCOUNTER — Encounter: Payer: Self-pay | Admitting: Physician Assistant

## 2016-01-09 VITALS — BP 120/70 | HR 76 | Temp 98.1°F | Resp 16 | Ht 65.5 in | Wt 184.9 lb

## 2016-01-09 DIAGNOSIS — Z7189 Other specified counseling: Secondary | ICD-10-CM

## 2016-01-09 DIAGNOSIS — Z7689 Persons encountering health services in other specified circumstances: Secondary | ICD-10-CM

## 2016-01-09 DIAGNOSIS — E11319 Type 2 diabetes mellitus with unspecified diabetic retinopathy without macular edema: Secondary | ICD-10-CM | POA: Diagnosis not present

## 2016-01-09 DIAGNOSIS — E1165 Type 2 diabetes mellitus with hyperglycemia: Secondary | ICD-10-CM

## 2016-01-09 DIAGNOSIS — E039 Hypothyroidism, unspecified: Secondary | ICD-10-CM | POA: Diagnosis not present

## 2016-01-09 DIAGNOSIS — Z794 Long term (current) use of insulin: Secondary | ICD-10-CM | POA: Diagnosis not present

## 2016-01-09 MED ORDER — LEVOTHYROXINE SODIUM 100 MCG PO TABS: 100.0000 ug | ORAL_TABLET | Freq: Every day | ORAL | Status: DC

## 2016-01-09 MED ORDER — INSULIN GLARGINE 100 UNIT/ML SOLOSTAR PEN: 10.0000 [IU] | PEN_INJECTOR | Freq: Two times a day (BID) | SUBCUTANEOUS | Status: DC

## 2016-01-09 NOTE — Patient Instructions (Signed)
Diabetes and Exercise Exercising regularly is important. It is not just about losing weight. It has many health benefits, such as:  Improving your overall fitness, flexibility, and endurance.  Increasing your bone density.  Helping with weight control.  Decreasing your body fat.  Increasing your muscle strength.  Reducing stress and tension.  Improving your overall health. People with diabetes who exercise gain additional benefits because exercise:  Reduces appetite.  Improves the body's use of blood sugar (glucose).  Helps lower or control blood glucose.  Decreases blood pressure.  Helps control blood lipids (such as cholesterol and triglycerides).  Improves the body's use of the hormone insulin by:  Increasing the body's insulin sensitivity.  Reducing the body's insulin needs.  Decreases the risk for heart disease because exercising:  Lowers cholesterol and triglycerides levels.  Increases the levels of good cholesterol (such as high-density lipoproteins [HDL]) in the body.  Lowers blood glucose levels. YOUR ACTIVITY PLAN  Choose an activity that you enjoy, and set realistic goals. To exercise safely, you should begin practicing any new physical activity slowly, and gradually increase the intensity of the exercise over time. Your health care provider or diabetes educator can help create an activity plan that works for you. General recommendations include:  Encouraging children to engage in at least 60 minutes of physical activity each day.  Stretching and performing strength training exercises, such as yoga or weight lifting, at least 2 times per week.  Performing a total of at least 150 minutes of moderate-intensity exercise each week, such as brisk walking or water aerobics.  Exercising at least 3 days per week, making sure you allow no more than 2 consecutive days to pass without exercising.  Avoiding long periods of inactivity (90 minutes or more). When you  have to spend an extended period of time sitting down, take frequent breaks to walk or stretch. RECOMMENDATIONS FOR EXERCISING WITH TYPE 1 OR TYPE 2 DIABETES   Check your blood glucose before exercising. If blood glucose levels are greater than 240 mg/dL, check for urine ketones. Do not exercise if ketones are present.  Avoid injecting insulin into areas of the body that are going to be exercised. For example, avoid injecting insulin into:  The arms when playing tennis.  The legs when jogging.  Keep a record of:  Food intake before and after you exercise.  Expected peak times of insulin action.  Blood glucose levels before and after you exercise.  The type and amount of exercise you have done.  Review your records with your health care provider. Your health care provider will help you to develop guidelines for adjusting food intake and insulin amounts before and after exercising.  If you take insulin or oral hypoglycemic agents, watch for signs and symptoms of hypoglycemia. They include:  Dizziness.  Shaking.  Sweating.  Chills.  Confusion.  Drink plenty of water while you exercise to prevent dehydration or heat stroke. Body water is lost during exercise and must be replaced.  Talk to your health care provider before starting an exercise program to make sure it is safe for you. Remember, almost any type of activity is better than none.   This information is not intended to replace advice given to you by your health care provider. Make sure you discuss any questions you have with your health care provider.   Document Released: 02/28/2004 Document Revised: 04/24/2015 Document Reviewed: 05/17/2013 Elsevier Interactive Patient Education 2016 Elsevier Inc[employer] Diabetes Mellitus and Food It is important for   you to manage your blood sugar (glucose) level. Your blood glucose level can be greatly affected by what you eat. Eating healthier foods in the appropriate amounts throughout  the day at about the same time each day will help you control your blood glucose level. It can also help slow or prevent worsening of your diabetes mellitus. Healthy eating may even help you improve the level of your blood pressure and reach or maintain a healthy weight.  General recommendations for healthful eating and cooking habits include:  Eating meals and snacks regularly. Avoid going long periods of time without eating to lose weight.  Eating a diet that consists mainly of plant-based foods, such as fruits, vegetables, nuts, legumes, and whole grains.  Using low-heat cooking methods, such as baking, instead of high-heat cooking methods, such as deep frying. Work with your dietitian to make sure you understand how to use the Nutrition Facts information on food labels. HOW CAN FOOD AFFECT ME? Carbohydrates Carbohydrates affect your blood glucose level more than any other type of food. Your dietitian will help you determine how many carbohydrates to eat at each meal and teach you how to count carbohydrates. Counting carbohydrates is important to keep your blood glucose at a healthy level, especially if you are using insulin or taking certain medicines for diabetes mellitus. Alcohol Alcohol can cause sudden decreases in blood glucose (hypoglycemia), especially if you use insulin or take certain medicines for diabetes mellitus. Hypoglycemia can be a life-threatening condition. Symptoms of hypoglycemia (sleepiness, dizziness, and disorientation) are similar to symptoms of having too much alcohol.  If your health care provider has given you approval to drink alcohol, do so in moderation and use the following guidelines:  Women should not have more than one drink per day, and men should not have more than two drinks per day. One drink is equal to:  12 oz of beer.  5 oz of wine.  1 oz of hard liquor.  Do not drink on an empty stomach.  Keep yourself hydrated. Have water, diet soda, or  unsweetened iced tea.  Regular soda, juice, and other mixers might contain a lot of carbohydrates and should be counted. WHAT FOODS ARE NOT RECOMMENDED? As you make food choices, it is important to remember that all foods are not the same. Some foods have fewer nutrients per serving than other foods, even though they might have the same number of calories or carbohydrates. It is difficult to get your body what it needs when you eat foods with fewer nutrients. Examples of foods that you should avoid that are high in calories and carbohydrates but low in nutrients include:  Trans fats (most processed foods list trans fats on the Nutrition Facts label).  Regular soda.  Juice.  Candy.  Sweets, such as cake, pie, doughnuts, and cookies.  Fried foods. WHAT FOODS CAN I EAT? Eat nutrient-rich foods, which will nourish your body and keep you healthy. The food you should eat also will depend on several factors, including:  The calories you need.  The medicines you take.  Your weight.  Your blood glucose level.  Your blood pressure level.  Your cholesterol level. You should eat a variety of foods, including:  Protein.  Lean cuts of meat.  Proteins low in saturated fats, such as fish, egg whites, and beans. Avoid processed meats.  Fruits and vegetables.  Fruits and vegetables that may help control blood glucose levels, such as apples, mangoes, and yams.  Dairy products.  Choose fat-free   or low-fat dairy products, such as milk, yogurt, and cheese.  Grains, bread, pasta, and rice.  Choose whole grain products, such as multigrain bread, whole oats, and brown rice. These foods may help control blood pressure.  Fats.  Foods containing healthful fats, such as nuts, avocado, olive oil, canola oil, and fish. DOES EVERYONE WITH DIABETES MELLITUS HAVE THE SAME MEAL PLAN? Because every person with diabetes mellitus is different, there is not one meal plan that works for everyone. It  is very important that you meet with a dietitian who will help you create a meal plan that is just right for you.   This information is not intended to replace advice given to you by your health care provider. Make sure you discuss any questions you have with your health care provider.   Document Released: 09/04/2005 Document Revised: 12/29/2014 Document Reviewed: 11/04/2013 Elsevier Interactive Patient Education 2016 Elsevier Inc.  

## 2016-01-09 NOTE — Progress Notes (Signed)
Patient: Karla Patton Female    DOB: 06-13-1951   65 y.o.   MRN: [id number] Visit Date: 01/09/2016  Today's Provider: Mar Daring, PA-C   Chief Complaint  Patient presents with  . Establish Care   Subjective:    HPI  Karla Patton is a 65 year old female establishing care and follow-up labs, done on 01/03/15 and HgbA1C 8.6. Patient has a history of Diabetes mellitus type 2.  Patient denies any symptoms: no chest pain, increase thirst, blurred vision, dizziness,no urine frequency. Patient checks her sugar levels usually in the mornings when fasting, readings are usually between 150-250. Her eye exam was on May 17,2016.  She was found to have diabetic retinopathy.   Per patient had Influenza vaccine 08/2015.    No Known Allergies Previous Medications   ALPRAZOLAM (XANAX) 0.5 MG TABLET    TAKE 1 TABLET BY MOUTH TWICE DAILY AS NEEDED.   AMITIZA 24 MCG CAPSULE    Take 1 capsule by mouth daily.    ASPIRIN 81 MG TABLET    Take 162 mg by mouth daily.    BAYER CONTOUR NEXT TEST TEST STRIP       GLUCAGON (GLUCAGEN) 1 MG SOLR INJECTION    Inject 1 mg into the muscle once as needed for low blood sugar.   INSULIN ASPART (NOVOLOG) 100 UNIT/ML INJECTION    Inject into the skin as needed for high blood sugar. Sliding scale.   INSULIN GLARGINE (LANTUS SOLOSTAR) 100 UNIT/ML SOLOSTAR PEN    Inject 17 Units into the skin daily at 10 pm.   LEVOTHYROXINE (SYNTHROID, LEVOTHROID) 100 MCG TABLET    Take 1 tablet by mouth daily.   PREGABALIN (LYRICA) 75 MG CAPSULE    Take 1 capsule (75 mg total) by mouth 3 (three) times daily.   TORSEMIDE (DEMADEX) 20 MG TABLET    Take 20 mg by mouth as needed.    Review of Systems  Constitutional: Negative.   HENT: Negative.   Eyes: Negative.  Negative for visual disturbance.  Respiratory: Negative.   Cardiovascular: Positive for leg swelling (ankle area; only occasionally and she takes torsemide when it does). Negative for chest pain and  palpitations.  Gastrointestinal: Negative.   Endocrine: Negative.  Negative for polydipsia and polyuria.  Genitourinary: Negative.  Negative for frequency.  Musculoskeletal: Negative.   Skin: Negative.   Allergic/Immunologic: Negative.   Neurological: Negative.  Negative for dizziness and light-headedness.  Hematological: Negative.   Psychiatric/Behavioral: Negative.     Social History  Substance Use Topics  . Smoking status: Never Smoker   . Smokeless tobacco: Not on file  . Alcohol Use: 4.2 oz/week    7 Glasses of wine per week   Objective:   BP 120/70 mmHg  Pulse 76  Temp(Src) 98.1 F (36.7 C) (Oral)  Resp 16  Ht 5' 5.5" (1.664 m)  Wt 184 lb 14.4 oz (83.87 kg)  BMI 30.29 kg/m2  Physical Exam  Constitutional: She is oriented to person, place, and time. She appears well-developed and well-nourished. No distress.  HENT:  Head: Normocephalic and atraumatic.  Right Ear: External ear normal.  Left Ear: External ear normal.  Nose: Nose normal.  Mouth/Throat: Oropharynx is clear and moist. No oropharyngeal exudate.  Eyes: Conjunctivae and EOM are normal. Pupils are equal, round, and reactive to light. Right eye exhibits no discharge. Left eye exhibits no discharge. No scleral icterus.  Neck: Normal range of motion. Neck supple. No JVD  present. No tracheal deviation present. No thyromegaly present.  Cardiovascular: Normal rate, regular rhythm, normal heart sounds and intact distal pulses.  Exam reveals no gallop and no friction rub.   No murmur heard. Pulmonary/Chest: Effort normal and breath sounds normal. No respiratory distress. She has no wheezes. She has no rales. She exhibits no tenderness.  Abdominal: Soft. Bowel sounds are normal. She exhibits no distension and no mass. There is no tenderness. There is no rebound and no guarding.  Musculoskeletal: Normal range of motion. She exhibits no edema or tenderness.  Lymphadenopathy:    She has no cervical adenopathy.    Neurological: She is alert and oriented to person, place, and time.  Skin: Skin is warm and dry. No rash noted. She is not diaphoretic.  Psychiatric: She has a normal mood and affect. Her behavior is normal. Judgment and thought content normal.  Vitals reviewed.       Assessment & Plan:     1. Establishing care with new doctor, encounter for Establishing from [place] [facility] office.   2. Uncontrolled type 2 diabetes mellitus with retinopathy, with long-term current use of insulin, macular edema presence unspecified, unspecified retinopathy severity (HCC) Currently stable on Lantus 10 units in a.m. And before bed. Also uses novolog on sliding scale before meals. Occasional hypoglycemic episodes for which she has a glucagon pen. Normal foot exam today.  HgBA1c was 8.6, previously 8.9 in Sept 2016. Lantus pen refilled below. She is to call the office if any acute issue, question or concerns arise.  I will see her back in 6 months. - Insulin Glargine (LANTUS SOLOSTAR) 100 UNIT/ML Solostar Pen; Inject 10 Units into the skin 2 (two) times daily.  Dispense: 5 pen; Refill: 6  3. Hypothyroidism, unspecified hypothyroidism type Currently stable on current dose 177mcg synthroid.  Continue current medical treatment plan.  Will recheck in 6 months.  She is to call the office if she has any acute issue, question or concern in the meantime. - levothyroxine (SYNTHROID, LEVOTHROID) 100 MCG tablet; Take 1 tablet (100 mcg total) by mouth daily before breakfast.  Dispense: 1 tablet; Refill: [place], PA-C  [facility]

## 2016-01-15 ENCOUNTER — Encounter: Payer: Self-pay | Admitting: Physician Assistant

## 2016-01-23 ENCOUNTER — Encounter: Payer: Self-pay | Admitting: Physician Assistant

## 2016-01-23 DIAGNOSIS — E119 Type 2 diabetes mellitus without complications: Secondary | ICD-10-CM

## 2016-01-23 MED ORDER — INSULIN ASPART 100 UNIT/ML FLEXPEN: PEN_INJECTOR | SUBCUTANEOUS | Status: DC

## 2016-02-17 ENCOUNTER — Encounter: Payer: Self-pay | Admitting: Physician Assistant

## 2016-02-17 DIAGNOSIS — Z794 Long term (current) use of insulin: Principal | ICD-10-CM

## 2016-02-17 DIAGNOSIS — E119 Type 2 diabetes mellitus without complications: Secondary | ICD-10-CM

## 2016-02-18 MED ORDER — INSULIN PEN NEEDLE 31G X 8 MM MISC: 1.0000 | Freq: Three times a day (TID) | Status: DC

## 2016-02-18 MED ORDER — GLUCAGON HCL (RDNA) 1 MG IJ SOLR: 1.0000 mg | Freq: Once | INTRAMUSCULAR | Status: DC | PRN

## 2016-02-21 ENCOUNTER — Other Ambulatory Visit: Payer: Self-pay | Admitting: Physician Assistant

## 2016-02-21 DIAGNOSIS — Z1231 Encounter for screening mammogram for malignant neoplasm of breast: Secondary | ICD-10-CM

## 2016-03-03 ENCOUNTER — Encounter: Payer: Self-pay | Admitting: General Surgery

## 2016-03-03 ENCOUNTER — Ambulatory Visit (INDEPENDENT_AMBULATORY_CARE_PROVIDER_SITE_OTHER): Payer: BLUE CROSS/BLUE SHIELD | Admitting: General Surgery

## 2016-03-03 ENCOUNTER — Other Ambulatory Visit: Payer: Self-pay

## 2016-03-03 VITALS — BP 130/74 | HR 74 | Resp 14 | Ht 65.5 in | Wt 183.0 lb

## 2016-03-03 DIAGNOSIS — I83812 Varicose veins of left lower extremities with pain: Secondary | ICD-10-CM | POA: Diagnosis not present

## 2016-03-03 DIAGNOSIS — I83892 Varicose veins of left lower extremities with other complications: Secondary | ICD-10-CM

## 2016-03-03 NOTE — Progress Notes (Signed)
Patient ID: Karla Patton, female   DOB: [birth year], 65 y.o.   MRN: [id number]  Chief Complaint  Patient presents with  . Acute Visit    Leg swelling    HPI Karla Patton is a 65 y.o. female. In today for left leg swelling and warmth with occasional pains for a month. History of varicose veins. She noted 1 month ago some redness around single varicose vein in the outer aspect above the left knee. She did take ibuprofen and the redness cleared.  I have reviewed the history of present illness with the patient.  HPI  Past Medical History  Diagnosis Date  . Varicose vein   . Diabetes mellitus without complication ([place])   . Thyroid disease   . Back pain     Past Surgical History  Procedure Laterality Date  . Abdominal hysterectomy  1994  . Incontinence surgery  2003  . Colonoscopy  2009    normal  . Varicose vein surgery  1990's    Dr Bary Castilla  . Cholecystectomy  09-07-13  . Left leg vein ligation  2015    Family History  Problem Relation Age of Onset  . Diabetes Mother   . Heart disease Father   . Stroke Father   . Hypertension Father     Social History Social History  Substance Use Topics  . Smoking status: Never Smoker   . Smokeless tobacco: None  . Alcohol Use: 4.2 oz/week    7 Glasses of wine per week    No Known Allergies  Current Outpatient Prescriptions  Medication Sig Dispense Refill  . ALPRAZolam (XANAX) 0.5 MG tablet TAKE 1 TABLET BY MOUTH TWICE DAILY AS NEEDED. 60 tablet 1  . aspirin 81 MG tablet Take 162 mg by mouth daily.     [name] BAYER CONTOUR NEXT TEST test strip     . glucagon (GLUCAGEN) 1 MG SOLR injection Inject 1 mg into the muscle once as needed for low blood sugar. 1 mg 12  . insulin aspart (NOVOLOG) 100 UNIT/ML FlexPen Inject as needed into the skin subcutaneously with sliding scale before each meal. 5 pen 6  . Insulin Glargine (LANTUS SOLOSTAR) 100 UNIT/ML Solostar Pen Inject 10 Units into the skin 2 (two) times daily. 5 pen 6  . Insulin Pen  Needle 31G X 8 MM MISC Inject 1 each as directed 4 (four) times daily -  before meals and at bedtime. 100 each 6  . levothyroxine (SYNTHROID, LEVOTHROID) 100 MCG tablet Take 1 tablet (100 mcg total) by mouth daily before breakfast. 1 tablet 6  . pregabalin (LYRICA) 75 MG capsule Take 1 capsule (75 mg total) by mouth 3 (three) times daily. 90 capsule 11  . torsemide (DEMADEX) 20 MG tablet Take 20 mg by mouth as needed.     No current facility-administered medications for this visit.    Review of Systems Review of Systems  Constitutional: Negative.   HENT: Negative.   Respiratory: Negative.     Blood pressure 130/74, pulse 74, resp. rate 14, height 5' 5.5" (1.664 m), weight 183 lb (83.008 kg).  Physical Exam Physical Exam  Constitutional: She is oriented to person, place, and time. She appears well-developed and well-nourished.  Eyes: Conjunctivae are normal. No scleral icterus.  Cardiovascular: Normal rate, regular rhythm and normal heart sounds.   Pulses:      Dorsalis pedis pulses are 2+ on the right side, and 2+ on the left side.       Posterior tibial  pulses are 2+ on the right side, and 2+ on the left side.  Residual vv left thigh and lateral/posterior calf. Pt pointed to the lateral aspect just above knee where she had noted some redness and pain 1 mo ago. No redness seen and no palpable clots. No edema in the legs and no stasis changes.   Abdominal: Soft. She exhibits no mass. There is no tenderness.  Lymphadenopathy:       Left: No inguinal adenopathy present.  Neurological: She is alert and oriented to person, place, and time.  Skin: Skin is warm and dry.  Psychiatric: Her behavior is normal.    Data Reviewed Notes reviewed  Assessment    Varicose veins noted  Ultrasound limited to the left thigh showed no evidence of phlebitis      Plan    If any flare-ups before her planned trip at the end of next month, she is to call.   May use Advil or Aleve if there is any  occurrence of pain or redness overlying the visible veins. Continue use of compression hose. Return as scheduled.    Scribed by Otila Kluver, LPN   Christene Lye 03/03/2016, 2:12 PM

## 2016-03-03 NOTE — Patient Instructions (Signed)
If any flare-ups before her trip, call.   May use Advil and Aleve.  Return as scheduled.

## 2016-03-14 ENCOUNTER — Other Ambulatory Visit: Payer: Self-pay | Admitting: Physician Assistant

## 2016-03-14 DIAGNOSIS — F419 Anxiety disorder, unspecified: Secondary | ICD-10-CM

## 2016-03-14 NOTE — Telephone Encounter (Signed)
Prescription called to [facility]. Pharmacy documented in chart.   Thanks,  -Noelia Lenart

## 2016-03-18 ENCOUNTER — Ambulatory Visit: Payer: BLUE CROSS/BLUE SHIELD

## 2016-03-19 ENCOUNTER — Ambulatory Visit
Admission: RE | Admit: 2016-03-19 | Discharge: 2016-03-19 | Disposition: A | Discharge status: Home / Self Care | Payer: BLUE CROSS/BLUE SHIELD | Source: Ambulatory Visit | Attending: Physician Assistant | Admitting: Physician Assistant

## 2016-03-19 ENCOUNTER — Telehealth: Payer: Self-pay

## 2016-03-19 DIAGNOSIS — Z1231 Encounter for screening mammogram for malignant neoplasm of breast: Secondary | ICD-10-CM | POA: Diagnosis present

## 2016-03-19 NOTE — Telephone Encounter (Signed)
-----   Message from Mar Daring, [place] sent at 03/19/2016  3:16 PM EDT ----- Normal mammogram. Repeat screening in one year.

## 2016-03-19 NOTE — Telephone Encounter (Signed)
Pt advised.   Thanks,   -Laura  

## 2016-04-04 ENCOUNTER — Other Ambulatory Visit: Payer: Self-pay | Admitting: Physician Assistant

## 2016-04-04 DIAGNOSIS — G629 Polyneuropathy, unspecified: Secondary | ICD-10-CM

## 2016-04-07 NOTE — Telephone Encounter (Signed)
Prescription for Lyrica 75 MG was called to [facility].  Thanks,  -Isaic Syler

## 2016-05-07 ENCOUNTER — Encounter: Payer: Self-pay | Admitting: Physician Assistant

## 2016-05-07 ENCOUNTER — Ambulatory Visit (INDEPENDENT_AMBULATORY_CARE_PROVIDER_SITE_OTHER): Payer: BLUE CROSS/BLUE SHIELD | Admitting: Physician Assistant

## 2016-05-07 VITALS — BP 130/80 | HR 67 | Temp 98.0°F | Resp 16 | Wt 178.0 lb

## 2016-05-07 DIAGNOSIS — G4710 Hypersomnia, unspecified: Secondary | ICD-10-CM | POA: Diagnosis not present

## 2016-05-07 DIAGNOSIS — R400 Somnolence: Secondary | ICD-10-CM

## 2016-05-07 DIAGNOSIS — Z0000 Encounter for general adult medical examination without abnormal findings: Secondary | ICD-10-CM | POA: Diagnosis not present

## 2016-05-07 DIAGNOSIS — R0683 Snoring: Secondary | ICD-10-CM

## 2016-05-07 DIAGNOSIS — R300 Dysuria: Secondary | ICD-10-CM | POA: Diagnosis not present

## 2016-05-07 DIAGNOSIS — S92912S Unspecified fracture of left toe(s), sequela: Secondary | ICD-10-CM

## 2016-05-07 LAB — POCT URINALYSIS DIPSTICK
Bilirubin, UA: NEGATIVE
Blood, UA: NEGATIVE
Glucose, UA: 500
Ketones, UA: 5
Leukocytes, UA: NEGATIVE
Nitrite, UA: NEGATIVE
Protein, UA: NEGATIVE
Spec Grav, UA: 1.01
Urobilinogen, UA: 0.2
pH, UA: 6.5

## 2016-05-07 NOTE — Progress Notes (Signed)
Patient: Karla Patton, Female    DOB: 10-31-1951, 65 y.o.   MRN: [id number] Visit Date: 05/07/2016  Today's Provider: Mar Daring, PA-C   Chief Complaint  Patient presents with  . Annual Exam   Subjective:    Annual physical exam Karla Patton is a 65 y.o. female who presents today for health maintenance and complete physical. She feels well. She reports exercising none. She reports she is sleeping poorly.She reports that she sleep between 5-6 hours. She has been having episodes while driving that her closed and at work also. She doesn't know if it is from being tired or something else but has being feeling very fatigue. She saw her Gyn on 03/16 pap was normal and mammogram also. She also saw the Dermatologist on 02/14. She is concern of having pain when urinating when her bowel is pressing on her bladder sometimes.  Colonoscopy: 12/23/2007 Mammogram:03/19/2016 Normal DM Foot exam: 01/09/2016 Eye Exam:04/25/2014 Negative Retinopathy PCV 13: 02/01/2014 Pneumovax: 09/02/2014 -----------------------------------------------------------------   Review of Systems  Constitutional: Positive for chills, diaphoresis and fatigue.  Eyes: Positive for redness.  Respiratory: Positive for cough.   Cardiovascular: Positive for leg swelling (This happened while she was on her cruise.).  Gastrointestinal: Positive for constipation and abdominal distention.  Genitourinary: Positive for dysuria.  Musculoskeletal: Positive for gait problem and neck pain.  Neurological: Positive for dizziness and headaches.  Hematological: Bruises/bleeds easily.  Psychiatric/Behavioral: Positive for decreased concentration.    Social History      She  reports that she has never smoked. She does not have any smokeless tobacco history on file. She reports that she drinks about 4.2 oz of alcohol per week. She reports that she does not use illicit drugs.       Social History   Social History  .  Marital Status: Divorced    Spouse Name: N/A  . Number of Children: N/A  . Years of Education: N/A   Social History Main Topics  . Smoking status: Never Smoker   . Smokeless tobacco: None  . Alcohol Use: 4.2 oz/week    7 Glasses of wine per week  . Drug Use: No  . Sexual Activity:    Partners: Male    Birth Control/ Protection: None     Comment: Female partner 18 years   Other Topics Concern  . None   Social History Narrative   Retired from [employer] as Engineer, maintenance   2 children Kief (1977, 1979)   Paden 3 blood related   5 step-grandchildren    Enjoys going to [employer] and reading   No pets     Past Medical History  Diagnosis Date  . Varicose vein   . Diabetes mellitus without complication ([place])   . Thyroid disease   . Back pain      Patient Active Problem List   Diagnosis Date Noted  . Background diabetic retinopathy ([place]) 05/22/2015  . Encounter to establish care 12/04/2014  . Type 2 diabetes mellitus, uncontrolled ([place]) 10/10/2014  . Hypothyroidism 10/10/2014  . Diabetic hypoglycemia ([place]) 10/10/2014  . Varicose veins of lower extremities with other complications [id number]    Past Surgical History  Procedure Laterality Date  . Abdominal hysterectomy  1994  . Incontinence surgery  2003  . Colonoscopy  2009    normal  . Varicose vein surgery  1990's    Dr Bary Castilla  . Cholecystectomy  09-07-13  . Left leg vein ligation  2015  Family History        No family status information on file.        Her family history includes Diabetes in her mother; Heart disease in her father; Hypertension in her father; Stroke in her father.    No Known Allergies  Previous Medications   ALPRAZOLAM (XANAX) 0.5 MG TABLET    TAKE 1 TABLET BY MOUTH TWICE DAILY AS NEEDED   ASPIRIN 81 MG TABLET    Take 162 mg by mouth daily.    BAYER CONTOUR NEXT TEST TEST STRIP       GLUCAGON (GLUCAGEN) 1 MG SOLR INJECTION    Inject 1 mg into the muscle once as needed for  low blood sugar.   INSULIN ASPART (NOVOLOG) 100 UNIT/ML FLEXPEN    Inject as needed into the skin subcutaneously with sliding scale before each meal.   INSULIN GLARGINE (LANTUS SOLOSTAR) 100 UNIT/ML SOLOSTAR PEN    Inject 10 Units into the skin 2 (two) times daily.   INSULIN PEN NEEDLE 31G X 8 MM MISC    Inject 1 each as directed 4 (four) times daily -  before meals and at bedtime.   LEVOTHYROXINE (SYNTHROID, LEVOTHROID) 100 MCG TABLET    Take 1 tablet (100 mcg total) by mouth daily before breakfast.   LYRICA 75 MG CAPSULE    TAKE 3 CAPSULES BY MOUTH EVERY NIGHT AT BEDTIME    Patient Care Team: Mar Daring, PA-C as PCP - General (Physician Assistant) Robert Bellow, MD as Consulting Physician (General Surgery) Shepard General, MD as Referring Physician (General Practice) Seeplaputhur Robinette Haines, MD (General Surgery)     Objective:   Vitals: BP 130/80 mmHg  Pulse 67  Temp(Src) 98 F (36.7 C) (Oral)  Resp 16  Wt 178 lb (80.74 kg)   Physical Exam   Depression Screen No flowsheet data found.    Assessment & Plan:     Routine Health Maintenance and Physical Exam  Exercise Activities and Dietary recommendations Goals    None      Immunization History  Administered Date(s) Administered  . Influenza Split 09/02/2014  . Pneumococcal Conjugate-13 02/01/2014  . Pneumococcal Polysaccharide-23 09/02/2014  . Zoster 02/28/2014    Health Maintenance  Topic Date Due  . Hepatitis C Screening  [birth year]  . HIV Screening  10/13/1967  . TETANUS/TDAP  10/13/1971  . HEMOGLOBIN A1C  10/06/2015  . URINE MICROALBUMIN  11/22/2015  . OPHTHALMOLOGY EXAM  05/07/2016  . INFLUENZA VACCINE  07/22/2016  . FOOT EXAM  01/08/2017  . PAP SMEAR  01/18/2017  . COLONOSCOPY  12/22/2017  . MAMMOGRAM  03/19/2018  . PNEUMOCOCCAL POLYSACCHARIDE VACCINE (2) 09/03/2019  . ZOSTAVAX  Completed      Discussed health benefits of physical activity, and encouraged her to engage in regular  exercise appropriate for her age and condition.  1. Annual physical exam Normal physical exam today. She is to call the office in the meantime if she has any acute issue, questions or concerns.  2. Toe fracture, left, sequela Needs to see Dr. Elvina Mattes for toe deformity and pain. - Ambulatory referral to Podiatry   --------------------------------------------------------------------

## 2016-06-09 ENCOUNTER — Other Ambulatory Visit: Payer: Self-pay | Admitting: Physician Assistant

## 2016-06-09 DIAGNOSIS — F411 Generalized anxiety disorder: Secondary | ICD-10-CM

## 2016-06-10 NOTE — Telephone Encounter (Signed)
RX for Alprazolam was called in to pharmacy.

## 2016-07-08 ENCOUNTER — Ambulatory Visit: Payer: BLUE CROSS/BLUE SHIELD | Admitting: Physician Assistant

## 2016-07-28 ENCOUNTER — Encounter: Payer: Self-pay | Admitting: *Deleted

## 2016-07-31 ENCOUNTER — Encounter: Payer: Self-pay | Admitting: Obstetrics & Gynecology

## 2016-07-31 ENCOUNTER — Encounter: Payer: Self-pay | Admitting: *Deleted

## 2016-07-31 ENCOUNTER — Ambulatory Visit (INDEPENDENT_AMBULATORY_CARE_PROVIDER_SITE_OTHER): Payer: BLUE CROSS/BLUE SHIELD | Admitting: Obstetrics & Gynecology

## 2016-07-31 VITALS — BP 126/72 | HR 80 | Ht 65.5 in | Wt 182.0 lb

## 2016-07-31 DIAGNOSIS — IMO0002 Reserved for concepts with insufficient information to code with codable children: Secondary | ICD-10-CM

## 2016-07-31 DIAGNOSIS — R87810 Cervical high risk human papillomavirus (HPV) DNA test positive: Secondary | ICD-10-CM

## 2016-07-31 NOTE — Progress Notes (Signed)
   Subjective:    Patient ID: Karla Patton, female    DOB: 08-09-1951, 65 y.o.   MRN: [id number]  HPI  65 yo MW lady here for a colpo due to a pap of her vaginal cuff this year that was negative but had + HR HPV.  Review of Systems She has had a sling for GSUI but is still having that issue at times.    Objective:   Physical Exam Pleasant WFNAD Breathing, conversing, and ambulating normally Left labia minora with a 1 cm raw area (she believes that this is from where her pad has rubbed her raw) I did a colpo after applying acetic acid. Marked atrophy was noted. She had a lot of red lesions at the top of the vaginal cuff. I biopsied one. Hemostasis was noted.       Assessment & Plan:  + HR HPV on vaginal pap smear- await pathology Refer to urology prn RTC if the vulvar red area does not resolve in the next 2 weeks

## 2016-08-06 ENCOUNTER — Encounter: Payer: Self-pay | Admitting: *Deleted

## 2016-08-06 ENCOUNTER — Telehealth: Payer: Self-pay | Admitting: *Deleted

## 2016-08-06 NOTE — Telephone Encounter (Signed)
Pt requesting results, informed her of results and that Dr Hulan Fray would contact her with any additional follow-up or suggestions.

## 2016-08-11 ENCOUNTER — Encounter: Payer: Self-pay | Admitting: Physician Assistant

## 2016-08-11 ENCOUNTER — Ambulatory Visit: Payer: BLUE CROSS/BLUE SHIELD | Admitting: Physician Assistant

## 2016-08-11 NOTE — Progress Notes (Deleted)
Patient: Karla Patton Female    DOB: 21-Feb-1951   65 y.o.   MRN: [id number] Visit Date: 08/11/2016  Today's Provider: Mar Daring, PA-C   No chief complaint on file.  Subjective:    HPI  Diabetes Mellitus Type II, Follow-up:   Lab Results  Component Value Date   HGBA1C 8.3 (A) 04/06/2015   HGBA1C 8.6 (H) 02/26/2015   HGBA1C 8.6 (A) 10/03/2014    Last seen for diabetes 6 months ago.  Management since then includes continue Lantus and Novolog. She reports good compliance with treatment. She is not having side effects.  Current symptoms include none and have been stable. Home blood sugar records: {diabetes glucometry results:16657}  Episodes of hypoglycemia? {yes***/no:17258}   Current Insulin Regimen: Novolog and Lantus Weight trend: {trend:16658} Current diet: {diet habits:16563} Current exercise: {exercise types:16438}    Hypothyroid, follow-up:  TSH  Date Value Ref Range Status  04/06/2015 2.30 0.41 - 5.90 uIU/mL Final  10/03/2014 1.51 0.41 - 5.90 uIU/mL Final   Wt Readings from Last 3 Encounters:  07/31/16 182 lb (82.6 kg)  05/07/16 178 lb (80.7 kg)  03/03/16 183 lb (83 kg)    She was last seen for hypothyroid 6 months ago.  Management since that visit includes continue Levothyroxine 100 mcg. She reports good compliance with treatment. She is not having side effects.  She {is/is not:9024} exercising. She is experiencing {Symptoms; thyroid:13835} She denies none Weight trend: stable  ------------------------------------------------------------------------   Pertinent Labs:    Component Value Date/Time   CHOL 202 (A) 04/06/2015   TRIG 78 04/06/2015   HDL 71 (A) 04/06/2015   LDLCALC 115 04/06/2015   CREATININE 0.8 04/06/2015   CREATININE 0.69 03/01/2015 0955    Wt Readings from Last 3 Encounters:  07/31/16 182 lb (82.6 kg)  05/07/16 178 lb (80.7 kg)  03/03/16 183 lb (83 kg)     ------------------------------------------------------------------------    Previous Medications   ALPRAZOLAM (XANAX) 0.5 MG TABLET    TAKE 1 TABLET BY MOUTH TWICE DAILY AS NEEDED   ASPIRIN 81 MG TABLET    Take 162 mg by mouth daily.    BAYER CONTOUR NEXT TEST TEST STRIP       DICLOFENAC (VOLTAREN) 75 MG EC TABLET    Take by mouth.   GLUCAGON (GLUCAGEN) 1 MG SOLR INJECTION    Inject 1 mg into the muscle once as needed for low blood sugar.   INSULIN ASPART (NOVOLOG) 100 UNIT/ML FLEXPEN    Inject as needed into the skin subcutaneously with sliding scale before each meal.   INSULIN GLARGINE (LANTUS SOLOSTAR) 100 UNIT/ML SOLOSTAR PEN    Inject 10 Units into the skin 2 (two) times daily.   INSULIN PEN NEEDLE 31G X 8 MM MISC    Inject 1 each as directed 4 (four) times daily -  before meals and at bedtime.   LEVOTHYROXINE (SYNTHROID, LEVOTHROID) 100 MCG TABLET    Take 1 tablet (100 mcg total) by mouth daily before breakfast.   LYRICA 75 MG CAPSULE    TAKE 3 CAPSULES BY MOUTH EVERY NIGHT AT BEDTIME    Review of Systems  Constitutional: Negative.   Respiratory: Negative.   Cardiovascular: Negative.   Endocrine: Negative.     Social History  Substance Use Topics  . Smoking status: Never Smoker  . Smokeless tobacco: Never Used  . Alcohol use 4.2 oz/week    7 Glasses of wine per week   Objective:   There were no vitals  taken for this visit.  Physical Exam      Assessment & Plan:       Follow up: No Follow-up on file.

## 2016-08-28 ENCOUNTER — Encounter: Payer: Self-pay | Admitting: Physician Assistant

## 2016-08-28 ENCOUNTER — Ambulatory Visit (INDEPENDENT_AMBULATORY_CARE_PROVIDER_SITE_OTHER): Payer: BLUE CROSS/BLUE SHIELD | Admitting: Physician Assistant

## 2016-08-28 VITALS — BP 142/80 | HR 71 | Temp 98.2°F | Resp 16 | Wt 186.6 lb

## 2016-08-28 DIAGNOSIS — E11319 Type 2 diabetes mellitus with unspecified diabetic retinopathy without macular edema: Secondary | ICD-10-CM | POA: Diagnosis not present

## 2016-08-28 DIAGNOSIS — E1165 Type 2 diabetes mellitus with hyperglycemia: Secondary | ICD-10-CM

## 2016-08-28 DIAGNOSIS — E039 Hypothyroidism, unspecified: Secondary | ICD-10-CM

## 2016-08-28 DIAGNOSIS — E7800 Pure hypercholesterolemia, unspecified: Secondary | ICD-10-CM | POA: Diagnosis not present

## 2016-08-28 DIAGNOSIS — Z794 Long term (current) use of insulin: Secondary | ICD-10-CM | POA: Diagnosis not present

## 2016-08-28 LAB — POCT GLYCOSYLATED HEMOGLOBIN (HGB A1C)
Est. average glucose Bld gHb Est-mCnc: 183
Hemoglobin A1C: 8

## 2016-08-28 MED ORDER — INSULIN GLARGINE 100 UNIT/ML SOLOSTAR PEN: 12.0000 [IU] | PEN_INJECTOR | Freq: Two times a day (BID) | SUBCUTANEOUS | 6 refills | Status: DC

## 2016-08-28 NOTE — Progress Notes (Signed)
Patient: Karla Patton Female    DOB: 08/15/1951   65 y.o.   MRN: [id number] Visit Date: 08/28/2016  Today's Provider: Mar Daring, PA-C   Chief Complaint  Patient presents with  . Follow-up    Diabetes and Hypothyroid   Subjective:    HPI  Hypothyroid, follow-up:  TSH  Date Value Ref Range Status  04/06/2015 2.30 0.41 - 5.90 uIU/mL Final  10/03/2014 1.51 0.41 - 5.90 uIU/mL Final   Wt Readings from Last 3 Encounters:  08/28/16 186 lb 9.6 oz (84.6 kg)  07/31/16 182 lb (82.6 kg)  05/07/16 178 lb (80.7 kg)    She was last seen for hypothyroid 6 months ago.  Management since that visit includes None. She reports excellent compliance with treatment. She is not having side effects.  She is not exercising. She is experiencing change in energy level She denies diarrhea, heat / cold intolerance, nervousness and palpitations Weight trend: fluctuating a bit  ------------------------------------------------------------------------  Diabetes Mellitus Type II, Follow-up:   Lab Results  Component Value Date   HGBA1C 8.0 08/28/2016   HGBA1C 8.3 (A) 04/06/2015   HGBA1C 8.6 (H) 02/26/2015    Last seen for diabetes 6 months ago.  Management since then includes None. She reports excellent compliance with treatment. She is not having side effects. Current symptoms include none and have been stable. Home blood sugar records: fasting range: 100's in the past week- This morning patient reports that it was 86.  Current Insulin Regimen: Lantus and Novolog- She reports that she is doing 12 units of the Lantus BID. Most Recent Eye Exam:  Weight trend: fluctuating a bit Current diet: well balanced Current exercise: none  Pertinent Labs:    Component Value Date/Time   CHOL 202 (A) 04/06/2015   TRIG 78 04/06/2015   HDL 71 (A) 04/06/2015   LDLCALC 115 04/06/2015   CREATININE 0.8 04/06/2015   CREATININE 0.69 03/01/2015 0955    Wt Readings from Last 3  Encounters:  08/28/16 186 lb 9.6 oz (84.6 kg)  07/31/16 182 lb (82.6 kg)  05/07/16 178 lb (80.7 kg)    ------------------------------------------------------------------------     No Known Allergies   Current Outpatient Prescriptions:  .  ALPRAZolam (XANAX) 0.5 MG tablet, TAKE 1 TABLET BY MOUTH TWICE DAILY AS NEEDED, Disp: 60 tablet, Rfl: 3 .  aspirin 81 MG tablet, Take 162 mg by mouth daily. , Disp: , Rfl:  .  BAYER CONTOUR NEXT TEST test strip, , Disp: , Rfl:  .  glucagon (GLUCAGEN) 1 MG SOLR injection, Inject 1 mg into the muscle once as needed for low blood sugar., Disp: 1 mg, Rfl: 12 .  insulin aspart (NOVOLOG) 100 UNIT/ML FlexPen, Inject as needed into the skin subcutaneously with sliding scale before each meal., Disp: 5 pen, Rfl: 6 .  Insulin Glargine (LANTUS SOLOSTAR) 100 UNIT/ML Solostar Pen, Inject 10 Units into the skin 2 (two) times daily., Disp: 5 pen, Rfl: 6 .  Insulin Pen Needle 31G X 8 MM MISC, Inject 1 each as directed 4 (four) times daily -  before meals and at bedtime., Disp: 100 each, Rfl: 6 .  levothyroxine (SYNTHROID, LEVOTHROID) 100 MCG tablet, Take 1 tablet (100 mcg total) by mouth daily before breakfast., Disp: 1 tablet, Rfl: 6 .  LYRICA 75 MG capsule, TAKE 3 CAPSULES BY MOUTH EVERY NIGHT AT BEDTIME, Disp: 90 capsule, Rfl: 3 .  diclofenac (VOLTAREN) 75 MG EC tablet, Take by mouth., Disp: , Rfl:  Review of Systems  Constitutional: Negative.   Respiratory: Negative.   Cardiovascular: Negative.   Gastrointestinal: Negative.   Endocrine: Negative for cold intolerance, heat intolerance, polydipsia and polyuria.  Neurological: Negative.     Social History  Substance Use Topics  . Smoking status: Never Smoker  . Smokeless tobacco: Never Used  . Alcohol use 4.2 oz/week    7 Glasses of wine per week   Objective:   BP (!) 142/80 (BP Location: Left Arm, Patient Position: Sitting, Cuff Size: Normal)   Pulse 71   Temp 98.2 F (36.8 C) (Oral)   Resp 16   Wt  186 lb 9.6 oz (84.6 kg)   BMI 30.58 kg/m   Physical Exam  Constitutional: She appears well-developed and well-nourished. No distress.  Neck: Normal range of motion. Neck supple. No JVD present. No tracheal deviation present. No thyromegaly present.  Cardiovascular: Normal rate, regular rhythm and normal heart sounds.  Exam reveals no gallop and no friction rub.   No murmur heard. Pulmonary/Chest: Effort normal and breath sounds normal. No respiratory distress. She has no wheezes. She has no rales.  Musculoskeletal: She exhibits no edema.  Lymphadenopathy:    She has no cervical adenopathy.  Skin: She is not diaphoretic.  Psychiatric: She has a normal mood and affect. Her behavior is normal. Judgment and thought content normal.  Vitals reviewed.     Assessment & Plan:     1. Uncontrolled type 2 diabetes mellitus with retinopathy, with long-term current use of insulin, macular edema presence unspecified, unspecified retinopathy severity ([place]) HgBA1c continues to improve. She is to continue Novolog sliding scale with meals, and 12 units of lantus in the morning and bedtime. Will check other labs and f/u pending labs. Continue healthy lifestyle modifications. I will see her back in 3 months to recheck HgBA1c. - POCT glycosylated hemoglobin (Hb A1C) - Insulin Glargine (LANTUS SOLOSTAR) 100 UNIT/ML Solostar Pen; Inject 12 Units into the skin 2 (two) times daily.  Dispense: 5 pen; Refill: 6 - CBC with Differential - Comprehensive Metabolic Panel (CMET)  2. Hypothyroidism, unspecified hypothyroidism type Stable. Continue current medical treatment plan. Will check labs as below and f/u pending results. - TSH  3. Hypercholesterolemia Stable. Continue current medical treatment plan. Will check labs as below and f/u pending results. - Lipid Profile       Mar Daring, PA-C  [facility]

## 2016-08-28 NOTE — Patient Instructions (Signed)

## 2016-08-29 LAB — COMPREHENSIVE METABOLIC PANEL
ALT: 23 IU/L (ref 0–32)
AST: 28 IU/L (ref 0–40)
Albumin/Globulin Ratio: 1.6 (ref 1.2–2.2)
Albumin: 4.6 g/dL (ref 3.6–4.8)
Alkaline Phosphatase: 98 IU/L (ref 39–117)
BUN/Creatinine Ratio: 15 (ref 12–28)
BUN: 11 mg/dL (ref 8–27)
Bilirubin Total: 0.4 mg/dL (ref 0.0–1.2)
CO2: 30 mmol/L — ABNORMAL HIGH (ref 18–29)
Calcium: 9.4 mg/dL (ref 8.7–10.3)
Chloride: 91 mmol/L — ABNORMAL LOW (ref 96–106)
Creatinine, Ser: 0.71 mg/dL (ref 0.57–1.00)
GFR calc Af Amer: 105 mL/min/{1.73_m2} (ref >59)
GFR calc non Af Amer: 91 mL/min/{1.73_m2} (ref >59)
Globulin, Total: 2.9 g/dL (ref 1.5–4.5)
Glucose: 122 mg/dL — ABNORMAL HIGH (ref 65–99)
Potassium: 4.4 mmol/L (ref 3.5–5.2)
Sodium: 133 mmol/L — ABNORMAL LOW (ref 134–144)
Total Protein: 7.5 g/dL (ref 6.0–8.5)

## 2016-08-29 LAB — CBC WITH DIFFERENTIAL/PLATELET
Basophils Absolute: 0 10*3/uL (ref 0.0–0.2)
Basos: 1 %
EOS (ABSOLUTE): 0.2 10*3/uL (ref 0.0–0.4)
Eos: 4 %
Hematocrit: 38.2 % (ref 34.0–46.6)
Hemoglobin: 12.9 g/dL (ref 11.1–15.9)
Immature Grans (Abs): 0 10*3/uL (ref 0.0–0.1)
Immature Granulocytes: 1 %
Lymphocytes Absolute: 1.9 10*3/uL (ref 0.7–3.1)
Lymphs: 35 %
MCH: 28.2 pg (ref 26.6–33.0)
MCHC: 33.8 g/dL (ref 31.5–35.7)
MCV: 84 fL (ref 79–97)
Monocytes Absolute: 0.6 10*3/uL (ref 0.1–0.9)
Monocytes: 11 %
Neutrophils Absolute: 2.7 10*3/uL (ref 1.4–7.0)
Neutrophils: 48 %
Platelets: 384 10*3/uL — ABNORMAL HIGH (ref 150–379)
RBC: 4.57 x10E6/uL (ref 3.77–5.28)
RDW: 14.3 % (ref 12.3–15.4)
WBC: 5.4 10*3/uL (ref 3.4–10.8)

## 2016-08-29 LAB — LIPID PANEL
Chol/HDL Ratio: 3.3 ratio units (ref 0.0–4.4)
Cholesterol, Total: 241 mg/dL — ABNORMAL HIGH (ref 100–199)
HDL: 74 mg/dL (ref >39)
LDL Calculated: 146 mg/dL — ABNORMAL HIGH (ref 0–99)
Triglycerides: 105 mg/dL (ref 0–149)
VLDL Cholesterol Cal: 21 mg/dL (ref 5–40)

## 2016-08-29 LAB — TSH: TSH: 1.99 u[IU]/mL (ref 0.450–4.500)

## 2016-09-13 ENCOUNTER — Other Ambulatory Visit: Payer: Self-pay | Admitting: Physician Assistant

## 2016-09-13 DIAGNOSIS — G629 Polyneuropathy, unspecified: Secondary | ICD-10-CM

## 2016-09-15 ENCOUNTER — Other Ambulatory Visit: Payer: Self-pay | Admitting: Physician Assistant

## 2016-09-15 NOTE — Telephone Encounter (Signed)
rx called into [facility].

## 2016-09-16 ENCOUNTER — Encounter: Payer: Self-pay | Admitting: Physician Assistant

## 2016-09-16 DIAGNOSIS — E1142 Type 2 diabetes mellitus with diabetic polyneuropathy: Secondary | ICD-10-CM

## 2016-09-16 DIAGNOSIS — Z794 Long term (current) use of insulin: Secondary | ICD-10-CM

## 2016-09-17 MED ORDER — GLUCOSE BLOOD VI STRP: ORAL_STRIP | 3 refills | Status: DC

## 2016-09-26 ENCOUNTER — Encounter: Payer: Self-pay | Admitting: Physician Assistant

## 2016-10-01 ENCOUNTER — Ambulatory Visit (INDEPENDENT_AMBULATORY_CARE_PROVIDER_SITE_OTHER): Payer: BLUE CROSS/BLUE SHIELD | Admitting: General Surgery

## 2016-10-01 ENCOUNTER — Encounter: Payer: Self-pay | Admitting: General Surgery

## 2016-10-01 VITALS — BP 128/74 | HR 74 | Resp 12 | Ht 65.0 in | Wt 183.0 lb

## 2016-10-01 DIAGNOSIS — I8393 Asymptomatic varicose veins of bilateral lower extremities: Secondary | ICD-10-CM | POA: Diagnosis not present

## 2016-10-01 NOTE — Patient Instructions (Addendum)
Continue use of compression hose. Return in one year.

## 2016-10-01 NOTE — Progress Notes (Signed)
Patient ID: Karla Patton, female   DOB: 1951/11/04, 65 y.o.   MRN: [id number]  Chief Complaint  Patient presents with  . Varicose Veins    HPI Karla Patton is a 65 y.o. female here today for her one year check with varicose veins. Patient states no new leg problems at this time.  I have reviewed the history of present illness with the patient.  HPI  Past Medical History:  Diagnosis Date  . Back pain   . Diabetes mellitus without complication ([place])   . Thyroid disease   . Varicose vein     Past Surgical History:  Procedure Laterality Date  . ABDOMINAL HYSTERECTOMY  1994  . CHOLECYSTECTOMY  09-07-13  . COLONOSCOPY  2009   normal  . INCONTINENCE SURGERY  2003  . left leg vein ligation  2015  . VARICOSE VEIN SURGERY  1990's   Dr Bary Castilla    Family History  Problem Relation Age of Onset  . Diabetes Mother   . Heart disease Father   . Stroke Father   . Hypertension Father     Social History Social History  Substance Use Topics  . Smoking status: Never Smoker  . Smokeless tobacco: Never Used  . Alcohol use 4.2 oz/week    7 Glasses of wine per week    No Known Allergies  Current Outpatient Prescriptions  Medication Sig Dispense Refill  . ALPRAZolam (XANAX) 0.5 MG tablet TAKE 1 TABLET BY MOUTH TWICE DAILY AS NEEDED 60 tablet 3  . aspirin 81 MG tablet Take 162 mg by mouth daily.     [name] glucagon (GLUCAGEN) 1 MG SOLR injection Inject 1 mg into the muscle once as needed for low blood sugar. 1 mg 12  . glucose blood (BAYER CONTOUR NEXT TEST) test strip Check blood sugar ACHS 400 each 3  . insulin aspart (NOVOLOG) 100 UNIT/ML FlexPen Inject as needed into the skin subcutaneously with sliding scale before each meal. 5 pen 6  . Insulin Glargine (LANTUS SOLOSTAR) 100 UNIT/ML Solostar Pen Inject 12 Units into the skin 2 (two) times daily. 5 pen 6  . Insulin Pen Needle 31G X 8 MM MISC Inject 1 each as directed 4 (four) times daily -  before meals and at bedtime. 100 each 6  .  levothyroxine (SYNTHROID, LEVOTHROID) 100 MCG tablet Take 1 tablet (100 mcg total) by mouth daily before breakfast. 1 tablet 6  . LYRICA 75 MG capsule TAKE 3 CAPSULES BY MOUTH EVERY NIGHT AT BEDTIME 90 capsule 5   No current facility-administered medications for this visit.     Review of Systems Review of Systems  Constitutional: Negative.   Respiratory: Negative.   Cardiovascular: Negative.     Blood pressure 128/74, pulse 74, resp. rate 12, height 5\' 5"  (1.651 m), weight 183 lb (83 kg).  Physical Exam Physical Exam  Constitutional: She is oriented to person, place, and time. She appears well-developed and well-nourished.  Eyes: Conjunctivae are normal. No scleral icterus.  Neck: Neck supple.  Cardiovascular: Normal rate, regular rhythm and normal heart sounds.   Pulses:      Dorsalis pedis pulses are 2+ on the right side, and 2+ on the left side.       Posterior tibial pulses are 2+ on the right side, and 2+ on the left side.  No edema. Minimal skin changes. residula VV in thigh and calf area.  Spider angiomata scattered over both legs, most near ankle. Findings are stable.  Pulmonary/Chest: Effort  normal and breath sounds normal.  Lymphadenopathy:    She has no cervical adenopathy.  Neurological: She is alert and oriented to person, place, and time.  Skin: Skin is warm and dry.    Data Reviewed Prior  Notes reviewed   Assessment    Varicose veins/venous insufficiency-asymptomatic    Plan    Continue use of compression hose. Return in one year  This information has been scribed by Gaspar Cola CMA. '       SANKAR,SEEPLAPUTHUR G 10/06/2016, 11:35 AM

## 2016-10-06 ENCOUNTER — Encounter: Payer: Self-pay | Admitting: General Surgery

## 2016-10-11 ENCOUNTER — Other Ambulatory Visit: Payer: Self-pay | Admitting: Physician Assistant

## 2016-10-11 DIAGNOSIS — F411 Generalized anxiety disorder: Secondary | ICD-10-CM

## 2016-10-13 NOTE — Telephone Encounter (Signed)
RX called into [facility]

## 2016-11-27 ENCOUNTER — Ambulatory Visit (INDEPENDENT_AMBULATORY_CARE_PROVIDER_SITE_OTHER): Payer: BLUE CROSS/BLUE SHIELD | Admitting: Physician Assistant

## 2016-11-27 ENCOUNTER — Encounter: Payer: Self-pay | Admitting: Physician Assistant

## 2016-11-27 VITALS — BP 130/70 | HR 94 | Temp 98.4°F | Resp 16 | Wt 188.6 lb

## 2016-11-27 DIAGNOSIS — E1165 Type 2 diabetes mellitus with hyperglycemia: Secondary | ICD-10-CM | POA: Diagnosis not present

## 2016-11-27 DIAGNOSIS — Z794 Long term (current) use of insulin: Secondary | ICD-10-CM

## 2016-11-27 DIAGNOSIS — E11319 Type 2 diabetes mellitus with unspecified diabetic retinopathy without macular edema: Secondary | ICD-10-CM

## 2016-11-27 LAB — POCT GLYCOSYLATED HEMOGLOBIN (HGB A1C)
Est. average glucose Bld gHb Est-mCnc: 177
Hemoglobin A1C: 7.8

## 2016-11-27 LAB — POCT UA - MICROALBUMIN: Microalbumin Ur, POC: 20 mg/L

## 2016-11-27 NOTE — Progress Notes (Signed)
Patient: Karla Patton Female    DOB: [birth year]   65 y.o.   MRN: [id number] Visit Date: 11/29/2016  Today's Provider: Mar Daring, PA-C   Chief Complaint  Patient presents with  . Follow-up    DM,Hypothyroidism,Hypercholesterolemia   Subjective:    HPI  Diabetes Mellitus Type II, Follow-up:   Lab Results  Component Value Date   HGBA1C 7.8 11/27/2016   HGBA1C 8.0 08/28/2016   HGBA1C 8.3 (A) 04/06/2015    Last seen for diabetes 3 months ago.  Management since then includes continue Novolog. She reports excellent compliance with treatment. She is not having side effects.  Current symptoms include none and have been stable. She reports that her sugar level this morning was 68 fasting.  Episodes of hypoglycemia? no   Current Insulin Regimen: Novolog and Lantus Weight trend: fluctuating a lot Current diet: well balanced Current exercise: none  Pertinent Labs:    Component Value Date/Time   CHOL 241 (H) 08/28/2016 1214   TRIG 105 08/28/2016 1214   HDL 74 08/28/2016 1214   LDLCALC 146 (H) 08/28/2016 1214   CREATININE 0.71 08/28/2016 1214    Wt Readings from Last 3 Encounters:  11/27/16 188 lb 9.6 oz (85.5 kg)  10/01/16 183 lb (83 kg)  08/28/16 186 lb 9.6 oz (84.6 kg)    ------------------------------------------------------------------------  Hypothyroid, follow-up:  TSH  Date Value Ref Range Status  08/28/2016 1.990 0.450 - 4.500 uIU/mL Final  04/06/2015 2.30 0.41 - 5.90 uIU/mL Final  10/03/2014 1.51 0.41 - 5.90 uIU/mL Final   Wt Readings from Last 3 Encounters:  11/27/16 188 lb 9.6 oz (85.5 kg)  10/01/16 183 lb (83 kg)  08/28/16 186 lb 9.6 oz (84.6 kg)    She was last seen for hypothyroid 3 months ago.  Management since that visit includes none. She reports excellent compliance with treatment. She is not having side effects.  She is not exercising. She is experiencing change in energy level and weight changes She denies  diarrhea, heat / cold intolerance, nervousness and palpitations Weight trend: fluctuating a lot  ------------------------------------------------------------------------  Lipid/Cholesterol, Follow-up:   Last seen for this3 months ago.  Management changes since that visit include none. . Last Lipid Panel:    Component Value Date/Time   CHOL 241 (H) 08/28/2016 1214   TRIG 105 08/28/2016 1214   HDL 74 08/28/2016 1214   CHOLHDL 3.3 08/28/2016 1214   CHOLHDL 3 02/26/2015 1420   VLDL 15.4 02/26/2015 1420   LDLCALC 146 (H) 08/28/2016 1214    Risk factors for vascular disease include diabetes mellitus and hypercholesterolemia  She reports excellent compliance with treatment. She is not having side effects.  Current symptoms include none and have been stable. Weight trend: fluctuating a lot Current diet: well balanced Current exercise: none  Wt Readings from Last 3 Encounters:  11/27/16 188 lb 9.6 oz (85.5 kg)  10/01/16 183 lb (83 kg)  08/28/16 186 lb 9.6 oz (84.6 kg)   -------------------------------------------------------------------     No Known Allergies   Current Outpatient Prescriptions:  .  ALPRAZolam (XANAX) 0.5 MG tablet, TAKE 1 TABLET BY MOUTH TWICE DAILY AS NEEDED, Disp: 60 tablet, Rfl: 5 .  aspirin 81 MG tablet, Take 162 mg by mouth daily. , Disp: , Rfl:  .  glucose blood (BAYER CONTOUR NEXT TEST) test strip, Check blood sugar ACHS, Disp: 400 each, Rfl: 3 .  insulin aspart (NOVOLOG) 100 UNIT/ML FlexPen, Inject as needed into the skin  subcutaneously with sliding scale before each meal., Disp: 5 pen, Rfl: 6 .  Insulin Glargine (LANTUS SOLOSTAR) 100 UNIT/ML Solostar Pen, Inject 12 Units into the skin 2 (two) times daily., Disp: 5 pen, Rfl: 6 .  Insulin Pen Needle 31G X 8 MM MISC, Inject 1 each as directed 4 (four) times daily -  before meals and at bedtime., Disp: 100 each, Rfl: 6 .  levothyroxine (SYNTHROID, LEVOTHROID) 100 MCG tablet, Take 1 tablet (100 mcg total)  by mouth daily before breakfast., Disp: 1 tablet, Rfl: 6 .  LYRICA 75 MG capsule, TAKE 3 CAPSULES BY MOUTH EVERY NIGHT AT BEDTIME, Disp: 90 capsule, Rfl: 5 .  glucagon (GLUCAGEN) 1 MG SOLR injection, Inject 1 mg into the muscle once as needed for low blood sugar. (Patient not taking: Reported on 11/27/2016), Disp: 1 mg, Rfl: 12  Review of Systems  Constitutional: Positive for fatigue.  Respiratory: Negative for cough, chest tightness, shortness of breath and wheezing.   Cardiovascular: Negative for chest pain, palpitations and leg swelling.  Gastrointestinal: Negative for abdominal pain and nausea.  Endocrine: Negative for cold intolerance, heat intolerance, polydipsia and polyuria.  Neurological: Positive for numbness. Negative for dizziness, weakness and headaches.    Social History  Substance Use Topics  . Smoking status: Never Smoker  . Smokeless tobacco: Never Used  . Alcohol use 4.2 oz/week    7 Glasses of wine per week   Objective:   BP 130/70 (BP Location: Right Arm, Patient Position: Sitting, Cuff Size: Normal)   Pulse 94   Temp 98.4 F (36.9 C) (Oral)   Resp 16   Wt 188 lb 9.6 oz (85.5 kg)   BMI 31.38 kg/m   Physical Exam  Constitutional: She appears well-developed and well-nourished. No distress.  Neck: Normal range of motion. Neck supple. No JVD present. No tracheal deviation present. No thyromegaly present.  Cardiovascular: Normal rate, regular rhythm and normal heart sounds.  Exam reveals no gallop and no friction rub.   No murmur heard. Pulmonary/Chest: Effort normal and breath sounds normal. No respiratory distress. She has no wheezes. She has no rales.  Musculoskeletal:  Patient is in fracture boot due to fracture and is followed by Dr. Milinda Pointer.  Lymphadenopathy:    She has no cervical adenopathy.  Skin: She is not diaphoretic.  Vitals reviewed.     Assessment & Plan:     1. Uncontrolled type 2 diabetes mellitus with retinopathy, with long-term current use  of insulin, macular edema presence unspecified, unspecified laterality, unspecified retinopathy severity (HCC) A1c is improving and is now below 8 so she may be considered for surgical repair of her fracture with Dr. Milinda Pointer. Urine microalbumin was normal today at 20. Continue current medical treatment plan and I'll see her back in 3 months. - POCT glycosylated hemoglobin (Hb A1C) - POCT UA - Microalbumin       Mar Daring, PA-C  [facility]

## 2016-11-27 NOTE — Patient Instructions (Signed)
Diabetes Mellitus and Exercise Exercising regularly is important for your overall health, especially when you have diabetes (diabetes mellitus). Exercising is not only about losing weight. It has many health benefits, such as increasing muscle strength and bone density and reducing body fat and stress. This leads to improved fitness, flexibility, and endurance, all of which result in better overall health. Exercise has additional benefits for people with diabetes, including:  Reducing appetite.  Helping to lower and control blood glucose.  Lowering blood pressure.  Helping to control amounts of fatty substances (lipids) in the blood, such as cholesterol and triglycerides.  Helping the body to respond better to insulin (improving insulin sensitivity).  Reducing how much insulin the body needs.  Decreasing the risk for heart disease by:  Lowering cholesterol and triglyceride levels.  Increasing the levels of good cholesterol.  Lowering blood glucose levels. What is my activity plan? Your health care provider or certified diabetes educator can help you make a plan for the type and frequency of exercise (activity plan) that works for you. Make sure that you:  Do at least 150 minutes of moderate-intensity or vigorous-intensity exercise each week. This could be brisk walking, biking, or water aerobics.  Do stretching and strength exercises, such as yoga or weightlifting, at least 2 times a week.  Spread out your activity over at least 3 days of the week.  Get some form of physical activity every day.  Do not go more than 2 days in a row without some kind of physical activity.  Avoid being inactive for more than 90 minutes at a time. Take frequent breaks to walk or stretch.  Choose a type of exercise or activity that you enjoy, and set realistic goals.  Start slowly, and gradually increase the intensity of your exercise over time. What do I need to know about managing my  diabetes?  Check your blood glucose before and after exercising.  If your blood glucose is higher than 240 mg/dL (13.3 mmol/L) before you exercise, check your urine for ketones. If you have ketones in your urine, do not exercise until your blood glucose returns to normal.  Know the symptoms of low blood glucose (hypoglycemia) and how to treat it. Your risk for hypoglycemia increases during and after exercise. Common symptoms of hypoglycemia can include:  Hunger.  Anxiety.  Sweating and feeling clammy.  Confusion.  Dizziness or feeling light-headed.  Increased heart rate or palpitations.  Blurry vision.  Tingling or numbness around the mouth, lips, or tongue.  Tremors or shakes.  Irritability.  Keep a rapid-acting carbohydrate snack available before, during, and after exercise to help prevent or treat hypoglycemia.  Avoid injecting insulin into areas of the body that are going to be exercised. For example, avoid injecting insulin into:  The arms, when playing tennis.  The legs, when jogging.  Keep records of your exercise habits. Doing this can help you and your health care provider adjust your diabetes management plan as needed. Write down:  Food that you eat before and after you exercise.  Blood glucose levels before and after you exercise.  The type and amount of exercise you have done.  When your insulin is expected to peak, if you use insulin. Avoid exercising at times when your insulin is peaking.  When you start a new exercise or activity, work with your health care provider to make sure the activity is safe for you, and to adjust your insulin, medicines, or food intake as needed.  Drink plenty   of water while you exercise to prevent dehydration or heat stroke. Drink enough fluid to keep your urine clear or pale yellow. This information is not intended to replace advice given to you by your health care provider. Make sure you discuss any questions you have with  your health care provider. Document Released: 02/28/2004 Document Revised: 06/27/2016 Document Reviewed: 05/19/2016 Elsevier Interactive Patient Education  2017 [employer]vier Inc[employer]  

## 2016-12-12 ENCOUNTER — Other Ambulatory Visit: Payer: Self-pay | Admitting: Physician Assistant

## 2016-12-12 DIAGNOSIS — E039 Hypothyroidism, unspecified: Secondary | ICD-10-CM

## 2017-02-26 ENCOUNTER — Encounter: Payer: Self-pay | Admitting: Physician Assistant

## 2017-02-26 ENCOUNTER — Ambulatory Visit (INDEPENDENT_AMBULATORY_CARE_PROVIDER_SITE_OTHER): Payer: BLUE CROSS/BLUE SHIELD | Admitting: Physician Assistant

## 2017-02-26 VITALS — BP 140/78 | HR 87 | Temp 98.3°F | Resp 16 | Wt 191.0 lb

## 2017-02-26 DIAGNOSIS — Z794 Long term (current) use of insulin: Secondary | ICD-10-CM | POA: Diagnosis not present

## 2017-02-26 DIAGNOSIS — E11319 Type 2 diabetes mellitus with unspecified diabetic retinopathy without macular edema: Secondary | ICD-10-CM

## 2017-02-26 DIAGNOSIS — E1165 Type 2 diabetes mellitus with hyperglycemia: Secondary | ICD-10-CM

## 2017-02-26 LAB — POCT GLYCOSYLATED HEMOGLOBIN (HGB A1C)
Est. average glucose Bld gHb Est-mCnc: 192
Hemoglobin A1C: 8.3

## 2017-02-26 NOTE — Progress Notes (Signed)
Patient: Karla Patton Female    DOB: Oct 23, 1951   66 y.o.   MRN: [id number] Visit Date: 02/26/2017  Today's Provider: Mar Daring, PA-C   Chief Complaint  Patient presents with  . Follow-up   Subjective:    HPI  Diabetes Mellitus Type II, Follow-up:   Lab Results  Component Value Date   HGBA1C 8.3 02/26/2017   HGBA1C 7.8 11/27/2016   HGBA1C 8.0 08/28/2016    Last seen for diabetes 3 months ago.  Management since then includes none. She reports excellent compliance with treatment. She is not having side effects.  Current symptoms include visual disturbances and chest pain that comes and goes and weight gain,fatigue and have been unchanged for the last few weeks. She also went on a cruise. Home blood sugar records:   Episodes of hypoglycemia? yes - for this week. She reports this morning it was at 53 she drink 2 cups of coffee with whole milk and rechecked her sugar and it went up to 183   Current Insulin Regimen: Novolog and Lantus Most Recent Eye Exam: UTD Weight trend: increasing steadily Current diet: in general "unhealthy" Current exercise: none  Pertinent Labs:    Component Value Date/Time   CHOL 241 (H) 08/28/2016 1214   TRIG 105 08/28/2016 1214   HDL 74 08/28/2016 1214   LDLCALC 146 (H) 08/28/2016 1214   CREATININE 0.71 08/28/2016 1214    Wt Readings from Last 3 Encounters:  02/26/17 191 lb (86.6 kg)  11/27/16 188 lb 9.6 oz (85.5 kg)  10/01/16 183 lb (83 kg)   ------------------------------------------------------------------------     No Known Allergies   Current Outpatient Prescriptions:  .  ALPRAZolam (XANAX) 0.5 MG tablet, TAKE 1 TABLET BY MOUTH TWICE DAILY AS NEEDED, Disp: 60 tablet, Rfl: 5 .  aspirin 81 MG tablet, Take 162 mg by mouth daily. , Disp: , Rfl:  .  glucose blood (BAYER CONTOUR NEXT TEST) test strip, Check blood sugar ACHS, Disp: 400 each, Rfl: 3 .  insulin aspart (NOVOLOG) 100 UNIT/ML FlexPen, Inject as needed  into the skin subcutaneously with sliding scale before each meal., Disp: 5 pen, Rfl: 6 .  Insulin Glargine (LANTUS SOLOSTAR) 100 UNIT/ML Solostar Pen, Inject 12 Units into the skin 2 (two) times daily., Disp: 5 pen, Rfl: 6 .  Insulin Pen Needle 31G X 8 MM MISC, Inject 1 each as directed 4 (four) times daily -  before meals and at bedtime., Disp: 100 each, Rfl: 6 .  levothyroxine (SYNTHROID, LEVOTHROID) 100 MCG tablet, TAKE 1 TABLET BY MOUTH DAILY BEFORE BREAKFAST., Disp: 30 tablet, Rfl: 5 .  LYRICA 75 MG capsule, TAKE 3 CAPSULES BY MOUTH EVERY NIGHT AT BEDTIME, Disp: 90 capsule, Rfl: 5 .  glucagon (GLUCAGEN) 1 MG SOLR injection, Inject 1 mg into the muscle once as needed for low blood sugar. (Patient not taking: Reported on 11/27/2016), Disp: 1 mg, Rfl: 12  Review of Systems  Constitutional: Positive for fatigue. Negative for appetite change and fever.  HENT: Negative.  Negative for congestion.   Eyes: Positive for visual disturbance.  Cardiovascular: Positive for chest pain. Negative for palpitations and leg swelling.  Gastrointestinal: Negative.   Endocrine: Negative.   Neurological: Negative.    Depression screen [facility] 2/9 02/26/2017  Decreased Interest 0  Down, Depressed, Hopeless 0  PHQ - 2 Score 0  Altered sleeping 0  Tired, decreased energy 1  Change in appetite 1  Feeling bad or failure about yourself  0  Trouble concentrating 0  Moving slowly or fidgety/restless 0  Suicidal thoughts 0  PHQ-9 Score 2  Difficult doing work/chores Not difficult at all   GAD 7 : Generalized Anxiety Score 02/26/2017  Nervous, Anxious, on Edge 0  Control/stop worrying 0  Worry too much - different things 1  Trouble relaxing 0  Restless 0  Easily annoyed or irritable 0  Afraid - awful might happen 1  Total GAD 7 Score 2   Diabetic Foot Exam - Simple   Simple Foot Form Diabetic Foot exam was performed with the following findings:  Yes 02/26/2017  1:54 PM  Visual Inspection No deformities, no  ulcerations, no other skin breakdown bilaterally:  Yes Sensation Testing Intact to touch and monofilament testing bilaterally:  Yes Pulse Check Posterior Tibialis and Dorsalis pulse intact bilaterally:  Yes Comments     Social History  Substance Use Topics  . Smoking status: Never Smoker  . Smokeless tobacco: Never Used  . Alcohol use 4.2 oz/week    7 Glasses of wine per week   Objective:   BP 140/78 (BP Location: Right Arm, Patient Position: Sitting, Cuff Size: Large)   Pulse 87   Temp 98.3 F (36.8 C) (Oral)   Resp 16   Wt 191 lb (86.6 kg)   BMI 31.78 kg/m     Physical Exam  Constitutional: She appears well-developed and well-nourished. No distress.  Neck: Normal range of motion. Neck supple. No JVD present. No tracheal deviation present. No thyromegaly present.  Cardiovascular: Normal rate, regular rhythm and normal heart sounds.  Exam reveals no gallop and no friction rub.   No murmur heard. Pulmonary/Chest: Effort normal and breath sounds normal. No respiratory distress. She has no wheezes. She has no rales.  Lymphadenopathy:    She has no cervical adenopathy.  Skin: She is not diaphoretic.  Vitals reviewed.     Assessment & Plan:     1. Uncontrolled type 2 diabetes mellitus with retinopathy, with long-term current use of insulin, macular edema presence unspecified, unspecified laterality, unspecified retinopathy severity (HCC) A1c worsened to 8.3. Patient reports it is from going on a cruise. She uses Insulin daily using a sliding scale of Novolog prior to meals and Lantus 12-15 units BID. She will work on lifestyle modifications. Continue current insulin regimen since she gets hypoglycemic episodes with too high of a dose. I will see her back in 3 months to recheck and check all labs at that time. She will also be due to repeat imaging for a "spot" that is monitored that is present on her liver.  - POCT glycosylated hemoglobin (Hb A1C)       Mar Daring, PA-C  [facility]

## 2017-03-10 ENCOUNTER — Other Ambulatory Visit: Payer: Self-pay | Admitting: Physician Assistant

## 2017-03-10 DIAGNOSIS — Z1231 Encounter for screening mammogram for malignant neoplasm of breast: Secondary | ICD-10-CM

## 2017-03-15 ENCOUNTER — Encounter: Payer: Self-pay | Admitting: Physician Assistant

## 2017-03-18 ENCOUNTER — Other Ambulatory Visit: Payer: Self-pay | Admitting: Physician Assistant

## 2017-03-18 ENCOUNTER — Telehealth: Payer: Self-pay | Admitting: Physician Assistant

## 2017-03-18 DIAGNOSIS — E1165 Type 2 diabetes mellitus with hyperglycemia: Principal | ICD-10-CM

## 2017-03-18 DIAGNOSIS — G629 Polyneuropathy, unspecified: Secondary | ICD-10-CM

## 2017-03-18 DIAGNOSIS — E11319 Type 2 diabetes mellitus with unspecified diabetic retinopathy without macular edema: Secondary | ICD-10-CM

## 2017-03-18 DIAGNOSIS — Z794 Long term (current) use of insulin: Principal | ICD-10-CM

## 2017-03-18 DIAGNOSIS — IMO0002 Reserved for concepts with insufficient information to code with codable children: Secondary | ICD-10-CM

## 2017-03-18 NOTE — Telephone Encounter (Signed)
Error/MW °

## 2017-03-18 NOTE — Telephone Encounter (Signed)
Called in to [facility] 

## 2017-03-24 ENCOUNTER — Ambulatory Visit
Admission: RE | Admit: 2017-03-24 | Discharge: 2017-03-24 | Disposition: A | Discharge status: Home / Self Care | Payer: BLUE CROSS/BLUE SHIELD | Source: Ambulatory Visit | Attending: Physician Assistant | Admitting: Physician Assistant

## 2017-03-24 DIAGNOSIS — Z1231 Encounter for screening mammogram for malignant neoplasm of breast: Secondary | ICD-10-CM | POA: Diagnosis not present

## 2017-04-19 ENCOUNTER — Other Ambulatory Visit: Payer: Self-pay | Admitting: Physician Assistant

## 2017-04-19 DIAGNOSIS — F411 Generalized anxiety disorder: Secondary | ICD-10-CM

## 2017-04-20 NOTE — Telephone Encounter (Signed)
Called into [facility], CMA

## 2017-04-20 NOTE — Telephone Encounter (Signed)
LOV 02/26/2017. Last refill 10/12/2016. Renaldo Fiddler, CMA

## 2017-04-21 ENCOUNTER — Other Ambulatory Visit: Payer: Self-pay | Admitting: Physician Assistant

## 2017-04-21 DIAGNOSIS — E119 Type 2 diabetes mellitus without complications: Secondary | ICD-10-CM

## 2017-05-06 ENCOUNTER — Other Ambulatory Visit: Payer: Self-pay

## 2017-05-06 ENCOUNTER — Encounter: Payer: Self-pay | Admitting: General Surgery

## 2017-05-06 ENCOUNTER — Ambulatory Visit (INDEPENDENT_AMBULATORY_CARE_PROVIDER_SITE_OTHER): Payer: BLUE CROSS/BLUE SHIELD | Admitting: General Surgery

## 2017-05-06 VITALS — BP 144/70 | HR 74 | Resp 12 | Ht 65.5 in | Wt 189.0 lb

## 2017-05-06 DIAGNOSIS — I83892 Varicose veins of left lower extremities with other complications: Secondary | ICD-10-CM | POA: Diagnosis not present

## 2017-05-06 DIAGNOSIS — I8393 Asymptomatic varicose veins of bilateral lower extremities: Secondary | ICD-10-CM | POA: Diagnosis not present

## 2017-05-06 DIAGNOSIS — I878 Other specified disorders of veins: Secondary | ICD-10-CM

## 2017-05-06 NOTE — Progress Notes (Signed)
Patient ID: Karla Patton, female   DOB: Feb 21, 1951, 66 y.o.   MRN: [id number]  Chief Complaint  Patient presents with  . Varicose Veins    HPI Karla Patton is a 66 y.o. female.  Here today for follow up varicose veins and swelling in left ankle. She states the swelling and pain started about 1 month ago. She did notice a red rash with the swelling as well. No problems with her right leg. She has not traveled recently. She has been wearing her compression hose.  HPI  Past Medical History:  Diagnosis Date  . Back pain   . Diabetes mellitus without complication ([place])   . Thyroid disease   . Varicose vein     Past Surgical History:  Procedure Laterality Date  . ABDOMINAL HYSTERECTOMY  1994  . CHOLECYSTECTOMY  09-07-13  . COLONOSCOPY  2009   normal  . INCONTINENCE SURGERY  2003  . left leg vein ligation  2015  . VARICOSE VEIN SURGERY  1990's   Dr Bary Castilla    Family History  Problem Relation Age of Onset  . Diabetes Mother   . Heart disease Father   . Stroke Father   . Hypertension Father     Social History Social History  Substance Use Topics  . Smoking status: Never Smoker  . Smokeless tobacco: Never Used  . Alcohol use 4.2 oz/week    7 Glasses of wine per week    No Known Allergies  Current Outpatient Prescriptions  Medication Sig Dispense Refill  . ALPRAZolam (XANAX) 0.5 MG tablet TAKE 1 TABLET BY MOUTH TWICE DAILY 60 tablet 5  . aspirin 81 MG tablet Take 162 mg by mouth daily.     [name] glucagon (GLUCAGEN) 1 MG SOLR injection Inject 1 mg into the muscle once as needed for low blood sugar. 1 mg 12  . glucose blood (BAYER CONTOUR NEXT TEST) test strip Check blood sugar ACHS 400 each 3  . Insulin Pen Needle 31G X 8 MM MISC Inject 1 each as directed 4 (four) times daily -  before meals and at bedtime. 100 each 6  . LANTUS SOLOSTAR 100 UNIT/ML Solostar Pen ADMINISTER 10 UNITS UNDER THE SKIN TWICE DAILY 15 mL 5  . levothyroxine (SYNTHROID, LEVOTHROID) 100 MCG tablet  TAKE 1 TABLET BY MOUTH DAILY BEFORE BREAKFAST. 30 tablet 5  . LYRICA 75 MG capsule TAKE 3 CAPSULES BY MOUTH EVERY NIGHT AT BEDTIME 90 capsule 5  . NOVOLOG FLEXPEN 100 UNIT/ML FlexPen INJECT SUBCUTANEOUSLY AS PER SLIDING SCALE BEFORE EACH MEAL 15 mL 5   No current facility-administered medications for this visit.     Review of Systems Review of Systems  Constitutional: Negative.   Respiratory: Negative.   Cardiovascular: Negative.     Blood pressure (!) 144/70, pulse 74, resp. rate 12, height 5' 5.5" (1.664 m), weight 189 lb (85.7 kg).  Physical Exam Physical Exam  Constitutional: She is oriented to person, place, and time. She appears well-developed and well-nourished.  Eyes: Conjunctivae are normal.  Cardiovascular: Normal rate, regular rhythm and normal heart sounds.   Pulses:      Dorsalis pedis pulses are 2+ on the right side, and 2+ on the left side.       Posterior tibial pulses are 2+ on the right side, and 2+ on the left side.  VV bilateral, left more pronounced.  Left leg above ankle with mild stasis change- redness and thickening. 1+ edema in both legs. Scattered spider angiomata  Lymphadenopathy:       Right: No inguinal adenopathy present.       Left: No inguinal adenopathy present.  Neurological: She is alert and oriented to person, place, and time.  Skin: Skin is warm and dry.  Psychiatric: Her behavior is normal.    Data Reviewed Prior notes. Duplex venous study of left leg was performed. CFV,FV and PV are all patent with good compressibility and normal dopler pattern. No evidence of DVT About 7-8cm above left medial malleolus there is an incompetant perforator vein with reflux of greater than 2 secs.  Assessment    VV bilateral with stasis changes left leg, more likely made worse by the incompetant perforator vein.    Plan    Continue to wear compression hose. Avoid sitting for long periods of time especially with long trips. Follow up in 6 weeks      HPI, Physical Exam, Assessment and Plan have been scribed under the direction and in the presence of Mckinley Jewel, MD  Karie Fetch, RN I have completed the exam and reviewed the above documentation for accuracy and completeness.  I agree with the above.  Haematologist has been used and any errors in dictation or transcription are unintentional.  Seeplaputhur G. Jamal Collin, M.D., F.A.C.S.   Junie Panning G 05/07/2017, 8:47 AM

## 2017-05-06 NOTE — Patient Instructions (Addendum)
The patient is aware to call back for any questions or concerns.  Varicose Veins Varicose veins are veins that have become enlarged and twisted. They are usually seen in the legs but can occur in other parts of the body as well. What are the causes? This condition is the result of valves in the veins not working properly. Valves in the veins help to return blood from the leg to the heart. If these valves are damaged, blood flows backward and backs up into the veins in the leg near the skin. This causes the veins to become larger. What increases the risk? People who are on their feet a lot, who are pregnant, or who are overweight are more likely to develop varicose veins. What are the signs or symptoms?  Bulging, twisted-appearing, bluish veins, most commonly found on the legs.  Leg pain or a feeling of heaviness. These symptoms may be worse at the end of the day.  Leg swelling.  Changes in skin color. How is this diagnosed? A health care provider can usually diagnose varicose veins by examining your legs. Your health care provider may also recommend an ultrasound of your leg veins. How is this treated? Most varicose veins can be treated at home.However, other treatments are available for people who have persistent symptoms or want to improve the cosmetic appearance of the varicose veins. These treatment options include:  Sclerotherapy. A solution is injected into the vein to close it off.  Laser treatment. A laser is used to heat the vein to close it off.  Radiofrequency vein ablation. An electrical current produced by radio waves is used to close off the vein.  Phlebectomy. The vein is surgically removed through small incisions made over the varicose vein.  Vein ligation and stripping. The vein is surgically removed through incisions made over the varicose vein after the vein has been tied (ligated). Follow these instructions at home:   Do not stand or sit in one position for long  periods of time. Do not sit with your legs crossed. Rest with your legs raised during the day.  Wear compression stockings as directed by your health care provider. These stockings help to prevent blood clots and reduce swelling in your legs.  Do not wear other tight, encircling garments around your legs, pelvis, or waist.  Walk as much as possible to increase blood flow.  Raise the foot of your bed at night with 2-inch blocks.  If you get a cut in the skin over the vein and the vein bleeds, lie down with your leg raised and press on it with a clean cloth until the bleeding stops. Then place a bandage (dressing) on the cut. See your health care provider if it continues to bleed. Contact a health care provider if:  The skin around your ankle starts to break down.  You have pain, redness, tenderness, or hard swelling in your leg over a vein.  You are uncomfortable because of leg pain. This information is not intended to replace advice given to you by your health care provider. Make sure you discuss any questions you have with your health care provider. Document Released: 09/17/2005 Document Revised: 05/15/2016 Document Reviewed: 06/10/2016 Elsevier Interactive Patient Education  2017 [employer].

## 2017-05-29 ENCOUNTER — Ambulatory Visit (INDEPENDENT_AMBULATORY_CARE_PROVIDER_SITE_OTHER): Payer: BLUE CROSS/BLUE SHIELD | Admitting: Physician Assistant

## 2017-05-29 ENCOUNTER — Encounter: Payer: Self-pay | Admitting: Physician Assistant

## 2017-05-29 VITALS — BP 118/62 | HR 68 | Temp 97.8°F | Resp 16 | Ht 64.0 in | Wt 188.0 lb

## 2017-05-29 DIAGNOSIS — Z1211 Encounter for screening for malignant neoplasm of colon: Secondary | ICD-10-CM

## 2017-05-29 DIAGNOSIS — Z1239 Encounter for other screening for malignant neoplasm of breast: Secondary | ICD-10-CM

## 2017-05-29 DIAGNOSIS — E7800 Pure hypercholesterolemia, unspecified: Secondary | ICD-10-CM | POA: Insufficient documentation

## 2017-05-29 DIAGNOSIS — Z0000 Encounter for general adult medical examination without abnormal findings: Secondary | ICD-10-CM

## 2017-05-29 DIAGNOSIS — Z1231 Encounter for screening mammogram for malignant neoplasm of breast: Secondary | ICD-10-CM

## 2017-05-29 DIAGNOSIS — Z794 Long term (current) use of insulin: Secondary | ICD-10-CM

## 2017-05-29 DIAGNOSIS — Z124 Encounter for screening for malignant neoplasm of cervix: Secondary | ICD-10-CM | POA: Diagnosis not present

## 2017-05-29 DIAGNOSIS — E1165 Type 2 diabetes mellitus with hyperglycemia: Secondary | ICD-10-CM

## 2017-05-29 DIAGNOSIS — E11319 Type 2 diabetes mellitus with unspecified diabetic retinopathy without macular edema: Secondary | ICD-10-CM

## 2017-05-29 DIAGNOSIS — E113299 Type 2 diabetes mellitus with mild nonproliferative diabetic retinopathy without macular edema, unspecified eye: Secondary | ICD-10-CM

## 2017-05-29 DIAGNOSIS — E039 Hypothyroidism, unspecified: Secondary | ICD-10-CM

## 2017-05-29 DIAGNOSIS — R5383 Other fatigue: Secondary | ICD-10-CM

## 2017-05-29 NOTE — Progress Notes (Signed)
Patient: Karla Patton, Female    DOB: [birth year], 66 y.o.   MRN: [id number] Visit Date: 05/29/2017  Today's Provider: Mar Daring, PA-C   Chief Complaint  Patient presents with  . Annual Exam   Subjective:    Annual physical exam Karla Patton is a 66 y.o. female who presents today for health maintenance and complete physical. She feels well. She reports exercising never. She reports she is sleeping poorly.  Last CPE- 05/07/2016 Last mammogram- 03/24/2017- BI-RADS 1 Last colonoscopy- Not documented in chart; believe this is due 2019. S/P colposcopy for HPV positive pap on 07/31/2016. No atypia or malignancy. -----------------------------------------------------------------   Review of Systems  Constitutional: Positive for fatigue. Negative for activity change, appetite change, chills, diaphoresis, fever and unexpected weight change.  HENT: Positive for hearing loss, postnasal drip and sinus pressure. Negative for congestion, dental problem, drooling, ear discharge, ear pain, facial swelling, mouth sores, nosebleeds, rhinorrhea, sinus pain, sneezing, sore throat, tinnitus, trouble swallowing and voice change.   Eyes: Negative.   Respiratory: Positive for cough. Negative for apnea, choking, chest tightness, shortness of breath, wheezing and stridor.   Cardiovascular: Positive for leg swelling. Negative for chest pain and palpitations.  Gastrointestinal: Positive for abdominal distention and constipation. Negative for abdominal pain, anal bleeding, blood in stool, diarrhea, nausea, rectal pain and vomiting.  Endocrine: Negative.   Genitourinary: Negative.   Musculoskeletal: Positive for back pain. Negative for arthralgias, gait problem, joint swelling, myalgias, neck pain and neck stiffness.  Skin: Negative.   Allergic/Immunologic: Positive for environmental allergies. Negative for food allergies and immunocompromised state.  Neurological: Positive for headaches.  Negative for dizziness, tremors, seizures, syncope, facial asymmetry, speech difficulty, weakness, light-headedness and numbness.  Hematological: Negative for adenopathy. Bruises/bleeds easily.  Psychiatric/Behavioral: Negative.     Social History      She  reports that she has never smoked. She has never used smokeless tobacco. She reports that she drinks about 4.2 oz of alcohol per week . She reports that she does not use drugs.       Social History   Social History  . Marital status: Divorced    Spouse name: N/A  . Number of children: 2  . Years of education: 12   Occupational History  . Retired    Social History Main Topics  . Smoking status: Never Smoker  . Smokeless tobacco: Never Used  . Alcohol use 4.2 oz/week    7 Shots of liquor per week     Comment: 1 airplane bottle of Fireball Whiskey every night  . Drug use: No  . Sexual activity: No   Other Topics Concern  . None   Social History Narrative   Retired from [employer] as Engineer, maintenance   2 children [place] (1977, 1979)   [place] 3 blood related   5 step-grandchildren    Enjoys going to [employer] and reading   No pets     Past Medical History:  Diagnosis Date  . Back pain   . Diabetes mellitus without complication ([place])   . Thyroid disease   . Varicose vein      Patient Active Problem List   Diagnosis Date Noted  . Background diabetic retinopathy ([place]) 05/22/2015  . Encounter to establish care 12/04/2014  . Type 2 diabetes mellitus, uncontrolled ([place]) 10/10/2014  . Hypothyroidism 10/10/2014  . Diabetic hypoglycemia ([place]) 10/10/2014  . Varicose veins of lower extremities with other complications 60/09/9322    Past  Surgical History:  Procedure Laterality Date  . ABDOMINAL HYSTERECTOMY  1994  . CHOLECYSTECTOMY  09-07-13  . COLONOSCOPY  2009   normal  . INCONTINENCE SURGERY  2003  . left leg vein ligation  2015  . VARICOSE VEIN SURGERY  1990's   Dr Bary Castilla    Family History          Family Status  Relation Status  . Mother Deceased  . Father Deceased  . Sister Alive        Her family history includes Diabetes in her mother; Fibromyalgia in her sister; Heart disease in her father; Hypertension in her father; Pancreatitis in her mother; Stroke in her father.     No Known Allergies   Current Outpatient Prescriptions:  .  ALPRAZolam (XANAX) 0.5 MG tablet, TAKE 1 TABLET BY MOUTH TWICE DAILY, Disp: 60 tablet, Rfl: 5 .  aspirin 81 MG tablet, Take 162 mg by mouth daily. , Disp: , Rfl:  .  glucagon (GLUCAGEN) 1 MG SOLR injection, Inject 1 mg into the muscle once as needed for low blood sugar., Disp: 1 mg, Rfl: 12 .  glucose blood (BAYER CONTOUR NEXT TEST) test strip, Check blood sugar ACHS, Disp: 400 each, Rfl: 3 .  Insulin Pen Needle 31G X 8 MM MISC, Inject 1 each as directed 4 (four) times daily -  before meals and at bedtime., Disp: 100 each, Rfl: 6 .  LANTUS SOLOSTAR 100 UNIT/ML Solostar Pen, ADMINISTER 10 UNITS UNDER THE SKIN TWICE DAILY, Disp: 15 mL, Rfl: 5 .  levothyroxine (SYNTHROID, LEVOTHROID) 100 MCG tablet, TAKE 1 TABLET BY MOUTH DAILY BEFORE BREAKFAST., Disp: 30 tablet, Rfl: 5 .  LYRICA 75 MG capsule, TAKE 3 CAPSULES BY MOUTH EVERY NIGHT AT BEDTIME, Disp: 90 capsule, Rfl: 5 .  NOVOLOG FLEXPEN 100 UNIT/ML FlexPen, INJECT SUBCUTANEOUSLY AS PER SLIDING SCALE BEFORE EACH MEAL, Disp: 15 mL, Rfl: 5   Patient Care Team: Rubye Beach as PCP - General (Physician Assistant) Robert Bellow, MD as Consulting Physician (General Surgery) Shepard General, MD as Referring Physician (General Practice) Christene Lye, MD (General Surgery)      Objective:   Vitals: BP 118/62 (BP Location: Right Arm, Patient Position: Sitting, Cuff Size: Large)   Pulse 68   Temp 97.8 F (36.6 C) (Oral)   Resp 16   Ht 5\' 4"  (1.626 m)   Wt 188 lb (85.3 kg)   BMI 32.27 kg/m    Vitals:   05/29/17 0925  BP: 118/62  Pulse: 68  Resp: 16  Temp: 97.8 F (36.6  C)  TempSrc: Oral  Weight: 188 lb (85.3 kg)  Height: 5\' 4"  (1.626 m)     Physical Exam  Constitutional: She is oriented to person, place, and time. She appears well-developed and well-nourished. No distress.  HENT:  Head: Normocephalic and atraumatic.  Right Ear: Hearing, tympanic membrane, external ear and ear canal normal.  Left Ear: Hearing, tympanic membrane, external ear and ear canal normal.  Nose: Nose normal.  Mouth/Throat: Uvula is midline, oropharynx is clear and moist and mucous membranes are normal. No oropharyngeal exudate.  Eyes: Conjunctivae and EOM are normal. Pupils are equal, round, and reactive to light. Right eye exhibits no discharge. Left eye exhibits no discharge. No scleral icterus.  Neck: Normal range of motion. Neck supple. No JVD present. Carotid bruit is not present. No tracheal deviation present. No thyromegaly present.  Cardiovascular: Normal rate, regular rhythm, normal heart sounds and intact distal pulses.  Exam  reveals no gallop and no friction rub.   No murmur heard. Pulses:      Dorsalis pedis pulses are 2+ on the right side, and 2+ on the left side.       Posterior tibial pulses are 2+ on the right side, and 2+ on the left side.  Pulmonary/Chest: Effort normal and breath sounds normal. No respiratory distress. She has no wheezes. She has no rales. She exhibits no tenderness.  Abdominal: Soft. Bowel sounds are normal. She exhibits no distension and no mass. There is no tenderness. There is no rebound and no guarding.  Musculoskeletal: Normal range of motion. She exhibits no edema or tenderness.  Lymphadenopathy:    She has no cervical adenopathy.  Neurological: She is alert and oriented to person, place, and time.  Skin: Skin is warm and dry. No rash noted. She is not diaphoretic.  Multiple varicosities and reticular veins noted on lower extremities  Psychiatric: She has a normal mood and affect. Her behavior is normal. Judgment and thought content  normal.  Vitals reviewed.    Depression Screen PHQ 2/9 Scores 05/29/2017 02/26/2017  PHQ - 2 Score 0 0  PHQ- 9 Score 4 2      Assessment & Plan:     Routine Health Maintenance and Physical Exam  Exercise Activities and Dietary recommendations Goals    None      Immunization History  Administered Date(s) Administered  . Influenza Split 09/02/2014  . Influenza-Unspecified 10/17/2016  . Pneumococcal Conjugate-13 02/01/2014  . Pneumococcal Polysaccharide-23 09/02/2014  . Tdap 11/26/2016  . Zoster 02/28/2014    Health Maintenance  Topic Date Due  . Hepatitis C Screening  [birth year]  . HIV Screening  10/13/1967  . PAP SMEAR  01/18/2017  . OPHTHALMOLOGY EXAM  06/10/2017  . INFLUENZA VACCINE  07/22/2017  . HEMOGLOBIN A1C  08/29/2017  . URINE MICROALBUMIN  11/27/2017  . COLONOSCOPY  12/22/2017  . FOOT EXAM  02/26/2018  . MAMMOGRAM  03/25/2019  . TETANUS/TDAP  11/26/2026     Discussed health benefits of physical activity, and encouraged her to engage in regular exercise appropriate for her age and condition.    1. Annual physical exam Normal physical exam today. Will check labs as below and f/u pending lab results. If labs are stable and WNL she will not need to have these rechecked for one year at her next annual physical exam. She is to call the office in the meantime if she has any acute issue, questions or concerns. - CBC w/Diff/Platelet - Comprehensive Metabolic Panel (CMET)  2. Breast cancer screening Mammogram in 03/2017 was normal.   3. Colon cancer screening Due next year. Patient deferred rectal exam.  4. Cervical cancer screening Followed by Dr. Hulan Fray.  5. Uncontrolled type 2 diabetes mellitus with retinopathy, with long-term current use of insulin, macular edema presence unspecified, unspecified laterality, unspecified retinopathy severity ([place]) Continue Novolog and Lantus as directed. Cut back on EtOH intake. Will check labs as below and f/u pending  results. - Comprehensive Metabolic Panel (CMET) - HgB A1c  6. Hypothyroidism, unspecified type Stable. Continue levothyroxine 159mcg. Will check labs as below and f/u pending results. - TSH  7. Background diabetic retinopathy ([place]) Has appt with ophthalmologist next month per patient.   8. Fatigue, unspecified type Unknown source. Will check labs as below and f/u pending results. - Vitamin D (25 hydroxy) - B12  9. Pure hypercholesterolemia Will check labs as below and f/u pending results. - Lipid Profile  --------------------------------------------------------------------  Mar Daring, PA-C  [facility]

## 2017-05-29 NOTE — Patient Instructions (Addendum)
Miralax for constipation Calcium 1200mg  + Vit D3 800mg  for bone health B complex

## 2017-05-30 LAB — LIPID PANEL
Chol/HDL Ratio: 3.2 ratio (ref 0.0–4.4)
Cholesterol, Total: 208 mg/dL — ABNORMAL HIGH (ref 100–199)
HDL: 66 mg/dL (ref >39)
LDL Calculated: 131 mg/dL — ABNORMAL HIGH (ref 0–99)
Triglycerides: 53 mg/dL (ref 0–149)
VLDL Cholesterol Cal: 11 mg/dL (ref 5–40)

## 2017-05-30 LAB — CBC WITH DIFFERENTIAL/PLATELET
Basophils Absolute: 0.1 10*3/uL (ref 0.0–0.2)
Basos: 1 %
EOS (ABSOLUTE): 0.1 10*3/uL (ref 0.0–0.4)
Eos: 2 %
Hematocrit: 37.4 % (ref 34.0–46.6)
Hemoglobin: 12.3 g/dL (ref 11.1–15.9)
Immature Grans (Abs): 0 10*3/uL (ref 0.0–0.1)
Immature Granulocytes: 0 %
Lymphocytes Absolute: 1.5 10*3/uL (ref 0.7–3.1)
Lymphs: 29 %
MCH: 28.1 pg (ref 26.6–33.0)
MCHC: 32.9 g/dL (ref 31.5–35.7)
MCV: 85 fL (ref 79–97)
Monocytes Absolute: 0.6 10*3/uL (ref 0.1–0.9)
Monocytes: 12 %
Neutrophils Absolute: 2.9 10*3/uL (ref 1.4–7.0)
Neutrophils: 56 %
Platelets: 389 10*3/uL — ABNORMAL HIGH (ref 150–379)
RBC: 4.38 x10E6/uL (ref 3.77–5.28)
RDW: 14 % (ref 12.3–15.4)
WBC: 5.3 10*3/uL (ref 3.4–10.8)

## 2017-05-30 LAB — COMPREHENSIVE METABOLIC PANEL
ALT: 21 IU/L (ref 0–32)
AST: 26 IU/L (ref 0–40)
Albumin/Globulin Ratio: 1.6 (ref 1.2–2.2)
Albumin: 4.5 g/dL (ref 3.6–4.8)
Alkaline Phosphatase: 87 IU/L (ref 39–117)
BUN/Creatinine Ratio: 16 (ref 12–28)
BUN: 11 mg/dL (ref 8–27)
Bilirubin Total: 0.4 mg/dL (ref 0.0–1.2)
CO2: 26 mmol/L (ref 18–29)
Calcium: 9.2 mg/dL (ref 8.7–10.3)
Chloride: 93 mmol/L — ABNORMAL LOW (ref 96–106)
Creatinine, Ser: 0.68 mg/dL (ref 0.57–1.00)
GFR calc Af Amer: 107 mL/min/{1.73_m2} (ref >59)
GFR calc non Af Amer: 93 mL/min/{1.73_m2} (ref >59)
Globulin, Total: 2.8 g/dL (ref 1.5–4.5)
Glucose: 137 mg/dL — ABNORMAL HIGH (ref 65–99)
Potassium: 5.1 mmol/L (ref 3.5–5.2)
Sodium: 133 mmol/L — ABNORMAL LOW (ref 134–144)
Total Protein: 7.3 g/dL (ref 6.0–8.5)

## 2017-05-30 LAB — VITAMIN D 25 HYDROXY (VIT D DEFICIENCY, FRACTURES): Vit D, 25-Hydroxy: 37.7 ng/mL (ref 30.0–100.0)

## 2017-05-30 LAB — VITAMIN B12: Vitamin B-12: 342 pg/mL (ref 232–1245)

## 2017-05-30 LAB — HEMOGLOBIN A1C
Est. average glucose Bld gHb Est-mCnc: 186 mg/dL
Hgb A1c MFr Bld: 8.1 % — ABNORMAL HIGH (ref 4.8–5.6)

## 2017-05-30 LAB — TSH: TSH: 1.89 u[IU]/mL (ref 0.450–4.500)

## 2017-06-11 ENCOUNTER — Encounter: Payer: Self-pay | Admitting: Physician Assistant

## 2017-06-17 ENCOUNTER — Ambulatory Visit: Payer: BLUE CROSS/BLUE SHIELD | Admitting: General Surgery

## 2017-06-30 ENCOUNTER — Encounter: Payer: Self-pay | Admitting: General Surgery

## 2017-06-30 ENCOUNTER — Ambulatory Visit (INDEPENDENT_AMBULATORY_CARE_PROVIDER_SITE_OTHER): Payer: BLUE CROSS/BLUE SHIELD | Admitting: General Surgery

## 2017-06-30 VITALS — BP 118/58 | HR 80 | Resp 14 | Ht 64.0 in | Wt 190.0 lb

## 2017-06-30 DIAGNOSIS — I8393 Asymptomatic varicose veins of bilateral lower extremities: Secondary | ICD-10-CM

## 2017-06-30 NOTE — Progress Notes (Signed)
Patient ID: Karla Patton, female   DOB: February 07, 1951, 66 y.o.   MRN: [id number]  Chief Complaint  Patient presents with  . Varicose Veins    follow up    HPI Karla Patton is a 66 y.o. female.  Here today for follow up varicose veins and swelling in left ankle. She states the swelling and pain does not seem to be any better. She did notice a red rash with the swelling one day in [place]. No problems with her right leg.  She has been wearing her compression hose. She states she is not feeling as well today, blood sugar is elevated today, 407.  HPI  Past Medical History:  Diagnosis Date  . Back pain   . Diabetes mellitus without complication ([place])   . Thyroid disease   . Varicose vein     Past Surgical History:  Procedure Laterality Date  . ABDOMINAL HYSTERECTOMY  1994  . CHOLECYSTECTOMY  09-07-13  . COLONOSCOPY  2009   normal  . INCONTINENCE SURGERY  2003  . left leg vein ligation  2015  . VARICOSE VEIN SURGERY  1990's   Dr Bary Castilla    Family History  Problem Relation Age of Onset  . Diabetes Mother   . Pancreatitis Mother   . Heart disease Father   . Stroke Father        x 2  . Hypertension Father   . Fibromyalgia Sister   . Scoliosis Sister   . Arthritis Sister   . Diabetes Sister     Social History Social History  Substance Use Topics  . Smoking status: Never Smoker  . Smokeless tobacco: Never Used  . Alcohol use 4.2 oz/week    7 Shots of liquor per week     Comment: 1 airplane bottle of Fireball Whiskey every night    No Known Allergies  Current Outpatient Prescriptions  Medication Sig Dispense Refill  . ALPRAZolam (XANAX) 0.5 MG tablet TAKE 1 TABLET BY MOUTH TWICE DAILY 60 tablet 5  . aspirin 81 MG tablet Take 162 mg by mouth daily.     [name] glucagon (GLUCAGEN) 1 MG SOLR injection Inject 1 mg into the muscle once as needed for low blood sugar. 1 mg 12  . glucose blood (BAYER CONTOUR NEXT TEST) test strip Check blood sugar ACHS 400 each 3  . Insulin  Pen Needle 31G X 8 MM MISC Inject 1 each as directed 4 (four) times daily -  before meals and at bedtime. 100 each 6  . LANTUS SOLOSTAR 100 UNIT/ML Solostar Pen ADMINISTER 10 UNITS UNDER THE SKIN TWICE DAILY 15 mL 5  . levothyroxine (SYNTHROID, LEVOTHROID) 100 MCG tablet TAKE 1 TABLET BY MOUTH DAILY BEFORE BREAKFAST. 30 tablet 5  . LYRICA 75 MG capsule TAKE 3 CAPSULES BY MOUTH EVERY NIGHT AT BEDTIME 90 capsule 5  . NOVOLOG FLEXPEN 100 UNIT/ML FlexPen INJECT SUBCUTANEOUSLY AS PER SLIDING SCALE BEFORE EACH MEAL 15 mL 5   No current facility-administered medications for this visit.     Review of Systems Review of Systems  Constitutional: Negative.   Respiratory: Negative.   Cardiovascular: Negative.     Blood pressure (!) 118/58, pulse 80, resp. rate 14, height 5\' 4"  (1.626 m), weight 190 lb (86.2 kg).  Physical Exam Physical Exam  Constitutional: She is oriented to person, place, and time. She appears well-developed and well-nourished.  Cardiovascular: Normal rate, regular rhythm and normal heart sounds.   Pulses:      Dorsalis pedis  pulses are 2+ on the right side, and 2+ on the left side.       Posterior tibial pulses are 2+ on the right side, and 2+ on the left side.  VV noted  Left thigh, posterior calf. No signs of phlebitis. No edema, mild  skin changes left leg noted today.  Neurological: She is alert and oriented to person, place, and time.  Skin: Skin is warm and dry.    Data Reviewed Prior notes reviewed  Left leg Duplex last time showed a possible incompetant perforator in left leg 7-8cm above MM Assessment    VV bilateral with stasis changes left leg, symptomatic  Plan        Continue to wear compression hose. Avoid sitting for long periods of time especially with long trips.  Patient to return in two months -ultrasound in office.  HPI, Physical Exam, Assessment and Plan have been scribed under the direction and in the presence of Mckinley Jewel, MD  Gaspar Cola, CMA  I have completed the exam and reviewed the above documentation for accuracy and completeness.  I agree with the above.  Haematologist has been used and any errors in dictation or transcription are unintentional.  Janny Crute G. Jamal Collin, M.D., F.A.C.S.   Junie Panning G 07/02/2017, 8:03 AM

## 2017-06-30 NOTE — Patient Instructions (Addendum)
Patient to return in two months ultrasound at that visit.  The patient is aware to call back for any questions or concerns.

## 2017-07-16 LAB — HM DIABETES EYE EXAM

## 2017-07-21 ENCOUNTER — Other Ambulatory Visit: Payer: Self-pay | Admitting: Physician Assistant

## 2017-07-21 DIAGNOSIS — E039 Hypothyroidism, unspecified: Secondary | ICD-10-CM

## 2017-07-21 NOTE — Telephone Encounter (Signed)
She had a CPE on 05/29/2017. TSH Was 1.890 at that time.

## 2017-07-22 ENCOUNTER — Encounter: Payer: Self-pay | Admitting: Physician Assistant

## 2017-08-28 ENCOUNTER — Telehealth: Payer: Self-pay | Admitting: Physician Assistant

## 2017-08-28 ENCOUNTER — Ambulatory Visit (INDEPENDENT_AMBULATORY_CARE_PROVIDER_SITE_OTHER): Payer: BLUE CROSS/BLUE SHIELD | Admitting: Physician Assistant

## 2017-08-28 ENCOUNTER — Encounter: Payer: Self-pay | Admitting: Physician Assistant

## 2017-08-28 VITALS — BP 128/60 | HR 68 | Temp 97.6°F | Resp 16 | Wt 189.0 lb

## 2017-08-28 DIAGNOSIS — Z1159 Encounter for screening for other viral diseases: Secondary | ICD-10-CM | POA: Diagnosis not present

## 2017-08-28 DIAGNOSIS — K7689 Other specified diseases of liver: Secondary | ICD-10-CM | POA: Diagnosis not present

## 2017-08-28 DIAGNOSIS — D1803 Hemangioma of intra-abdominal structures: Secondary | ICD-10-CM | POA: Insufficient documentation

## 2017-08-28 DIAGNOSIS — E1140 Type 2 diabetes mellitus with diabetic neuropathy, unspecified: Secondary | ICD-10-CM | POA: Diagnosis not present

## 2017-08-28 DIAGNOSIS — Z794 Long term (current) use of insulin: Secondary | ICD-10-CM | POA: Diagnosis not present

## 2017-08-28 DIAGNOSIS — K5900 Constipation, unspecified: Secondary | ICD-10-CM | POA: Insufficient documentation

## 2017-08-28 DIAGNOSIS — Z1211 Encounter for screening for malignant neoplasm of colon: Secondary | ICD-10-CM | POA: Diagnosis not present

## 2017-08-28 DIAGNOSIS — H9191 Unspecified hearing loss, right ear: Secondary | ICD-10-CM | POA: Diagnosis not present

## 2017-08-28 DIAGNOSIS — Z23 Encounter for immunization: Secondary | ICD-10-CM | POA: Diagnosis not present

## 2017-08-28 DIAGNOSIS — E103293 Type 1 diabetes mellitus with mild nonproliferative diabetic retinopathy without macular edema, bilateral: Secondary | ICD-10-CM | POA: Insufficient documentation

## 2017-08-28 DIAGNOSIS — H9311 Tinnitus, right ear: Secondary | ICD-10-CM | POA: Insufficient documentation

## 2017-08-28 DIAGNOSIS — E11319 Type 2 diabetes mellitus with unspecified diabetic retinopathy without macular edema: Secondary | ICD-10-CM | POA: Diagnosis not present

## 2017-08-28 LAB — POCT GLYCOSYLATED HEMOGLOBIN (HGB A1C): Hemoglobin A1C: 8.4

## 2017-08-28 MED ORDER — INSULIN GLARGINE 100 UNIT/ML SOLOSTAR PEN: 22.0000 [IU] | PEN_INJECTOR | Freq: Every day | SUBCUTANEOUS | 5 refills | Status: DC

## 2017-08-28 NOTE — Patient Instructions (Signed)
Lantus increase to 22 units at bedtime only.  Work on making sure to take Novolog sliding scale with meals  Work on lifestyle modifications

## 2017-08-28 NOTE — Progress Notes (Signed)
Patient: Karla Patton Female    DOB: [birth year]   66 y.o.   MRN: [id number] Visit Date: 08/28/2017  Today's Provider: Mar Daring, PA-C   Chief Complaint  Patient presents with  . Hyperlipidemia  . Diabetes   Subjective:    HPI   Diabetes Mellitus Type II, Follow-up:   Lab Results  Component Value Date   HGBA1C 8.4 08/28/2017   HGBA1C 8.1 (H) 05/29/2017   HGBA1C 8.3 02/26/2017   Last seen for diabetes 3 months ago.  Management since then includes none. She reports excellent compliance with treatment. She is not having side effects.  Current symptoms include hypoglycemia  and have been stable. Home blood sugar records: 100-200  Episodes of hypoglycemia? yes - Pt reports having lows regularly.    Current Insulin Regimen: 10 units lantus BID and Novolog sliding scale Most Recent Eye Exam: 07/22/2017 Weight trend: stable Prior visit with dietician: no Current diet: in general, a "healthy" diet  (but not all the time) Current exercise: none  ------------------------------------------------------------------------     Lipid/Cholesterol, Follow-up:   Last seen for this 3 months ago.  Management since that visit includes None.  Last Lipid Panel:    Component Value Date/Time   CHOL 208 (H) 05/29/2017 1046   TRIG 53 05/29/2017 1046   HDL 66 05/29/2017 1046   CHOLHDL 3.2 05/29/2017 1046   CHOLHDL 3 02/26/2015 1420   VLDL 15.4 02/26/2015 1420   LDLCALC 131 (H) 05/29/2017 1046    She reports excellent compliance with treatment. She is not having side effects.  ------------------------------------------------------------------------      No Known Allergies   Current Outpatient Prescriptions:  .  ALPRAZolam (XANAX) 0.5 MG tablet, TAKE 1 TABLET BY MOUTH TWICE DAILY, Disp: 60 tablet, Rfl: 5 .  aspirin 81 MG tablet, Take 162 mg by mouth daily. , Disp: , Rfl:  .  glucagon (GLUCAGEN) 1 MG SOLR injection, Inject 1 mg into the muscle once as  needed for low blood sugar., Disp: 1 mg, Rfl: 12 .  glucose blood (BAYER CONTOUR NEXT TEST) test strip, Check blood sugar ACHS, Disp: 400 each, Rfl: 3 .  Insulin Pen Needle 31G X 8 MM MISC, Inject 1 each as directed 4 (four) times daily -  before meals and at bedtime., Disp: 100 each, Rfl: 6 .  LANTUS SOLOSTAR 100 UNIT/ML Solostar Pen, ADMINISTER 10 UNITS UNDER THE SKIN TWICE DAILY, Disp: 15 mL, Rfl: 5 .  levothyroxine (SYNTHROID, LEVOTHROID) 100 MCG tablet, TAKE 1 TABLET BY MOUTH DAILY BEFORE BREAKFAST., Disp: 90 tablet, Rfl: 1 .  LYRICA 75 MG capsule, TAKE 3 CAPSULES BY MOUTH EVERY NIGHT AT BEDTIME, Disp: 90 capsule, Rfl: 5 .  NOVOLOG FLEXPEN 100 UNIT/ML FlexPen, INJECT SUBCUTANEOUSLY AS PER SLIDING SCALE BEFORE EACH MEAL, Disp: 15 mL, Rfl: 5  Review of Systems  Constitutional: Negative.   Respiratory: Negative.   Cardiovascular: Negative.   Gastrointestinal: Positive for constipation. Negative for abdominal distention, abdominal pain, anal bleeding, blood in stool, diarrhea, nausea, rectal pain and vomiting.  Musculoskeletal: Negative.   Neurological: Negative for dizziness, light-headedness and headaches.    Social History  Substance Use Topics  . Smoking status: Never Smoker  . Smokeless tobacco: Never Used  . Alcohol use 4.2 oz/week    7 Shots of liquor per week     Comment: 1 airplane bottle of Fireball Whiskey every night   Objective:   BP 128/60 (BP Location: Right Arm, Patient Position: Sitting, Cuff  Size: Normal)   Pulse 68   Temp 97.6 F (36.4 C) (Oral)   Resp 16   Wt 189 lb (85.7 kg)   BMI 32.44 kg/m  Vitals:   08/28/17 0922  BP: 128/60  Pulse: 68  Resp: 16  Temp: 97.6 F (36.4 C)  TempSrc: Oral  Weight: 189 lb (85.7 kg)     Physical Exam  Constitutional: She appears well-developed and well-nourished. No distress.  HENT:  Head: Normocephalic and atraumatic.  Right Ear: Hearing, tympanic membrane, external ear and ear canal normal.  Left Ear: Hearing,  tympanic membrane, external ear and ear canal normal.  Nose: Nose normal.  Mouth/Throat: Uvula is midline, oropharynx is clear and moist and mucous membranes are normal. No oropharyngeal exudate.  Eyes: Pupils are equal, round, and reactive to light. Conjunctivae are normal. Right eye exhibits no discharge. Left eye exhibits no discharge. No scleral icterus.  Neck: Normal range of motion. Neck supple. No JVD present. No tracheal deviation present. No thyromegaly present.  Cardiovascular: Normal rate, regular rhythm and normal heart sounds.  Exam reveals no gallop and no friction rub.   No murmur heard. Pulmonary/Chest: Effort normal and breath sounds normal. No stridor. No respiratory distress. She has no wheezes. She has no rales.  Musculoskeletal: She exhibits edema (multiple varicosities and reticular/spider veins noted bilaterally).  Lymphadenopathy:    She has no cervical adenopathy.  Skin: Skin is warm and dry. She is not diaphoretic.  Vitals reviewed.      Assessment & Plan:      1. Type 2 diabetes mellitus with retinopathy of both eyes, with long-term current use of insulin, macular edema presence unspecified, unspecified retinopathy severity ([facility]) A1c up to 8.4. Patient admits to missing some doses of her sliding scale novolog. She also has not been adhering to a diabetic diet. We will change Lantus to once daily dosing at bedtime and advised to try to be better about her Novolog injections. Also discussed dietary changes and work on increasing physical activity. I will see her back in 3 months for recheck. - POCT HgB A1C - Insulin Glargine (LANTUS SOLOSTAR) 100 UNIT/ML Solostar Pen; Inject 22 Units into the skin daily at 10 pm.  Dispense: 15 mL; Refill: 5  2. Type 2 diabetes mellitus with diabetic neuropathy, with long-term current use of insulin ([place]) See above medical treatment plan. - Insulin Glargine (LANTUS SOLOSTAR) 100 UNIT/ML Solostar Pen; Inject 22 Units into the skin  daily at 10 pm.  Dispense: 15 mL; Refill: 5  3. Hepatic cyst H/O this. Not imaged in many years. Had some RUQ pain about a month ago that has improved. Will check stability of hepatic lesion. - US Abdomen Limited RUQ; Future  4. Colon cancer screening Due for colonoscopy. Done by Dr. Bary Castilla previously.  - Ambulatory referral to Gastroenterology  5. Tinnitus of right ear Worsening. No TM abnormality noted on exam. Patient also reports hearing loss in the right ear. Suspect sensorineuro hearing loss. Referral placed to ENT for further evaluation.  - Ambulatory referral to ENT  6. Hearing loss of right ear, unspecified hearing loss type See above medical treatment plan. - Ambulatory referral to ENT  7. Constipation, unspecified constipation type Patient has increased fluids and fiber. She has never tried Miralax. Advised to try Miralax 2 capfuls until soft BM daily, then decrease dose and adjust as needed per stool consistency.   8. Need for influenza vaccination Flu vaccine given today without complication. Patient sat upright for 15  minutes to check for adverse reaction before being released. - Flu Vaccine QUAD 6+ mos PF IM (Fluarix Quad PF)  9. Need for hepatitis C screening test - Hepatitis C Antibody       Mar Daring, PA-C  [facility]

## 2017-08-28 NOTE — Telephone Encounter (Signed)
Per Threasa Beards at Dr Dwyane Luo office pt is not due for colonoscopy until March of 2020

## 2017-08-28 NOTE — Telephone Encounter (Signed)
Patient advised as directed below.  Thanks,  -Joseline 

## 2017-08-28 NOTE — Telephone Encounter (Signed)
Can we please let patient know.   Thanks!

## 2017-08-29 LAB — SPECIMEN ID NOTIFICATION MISSING 2ND ID

## 2017-08-29 LAB — HEPATITIS C ANTIBODY
Hepatitis C Ab: NONREACTIVE
SIGNAL TO CUT-OFF: 0.03 (ref <1.00)

## 2017-09-02 ENCOUNTER — Other Ambulatory Visit: Payer: Self-pay

## 2017-09-02 ENCOUNTER — Ambulatory Visit (INDEPENDENT_AMBULATORY_CARE_PROVIDER_SITE_OTHER): Payer: BLUE CROSS/BLUE SHIELD | Admitting: General Surgery

## 2017-09-02 ENCOUNTER — Encounter: Payer: Self-pay | Admitting: General Surgery

## 2017-09-02 VITALS — BP 124/78 | HR 72 | Resp 14 | Ht 64.0 in | Wt 189.0 lb

## 2017-09-02 DIAGNOSIS — I8393 Asymptomatic varicose veins of bilateral lower extremities: Secondary | ICD-10-CM

## 2017-09-02 DIAGNOSIS — I878 Other specified disorders of veins: Secondary | ICD-10-CM

## 2017-09-02 NOTE — Patient Instructions (Signed)
Continue wearing compression stockings. Rest and elevate legs as often as possible.

## 2017-09-02 NOTE — Progress Notes (Addendum)
Patient ID: Karla Patton, female   DOB: September 27, 1951, 66 y.o.   MRN: [id number]  Chief Complaint  Patient presents with  . Follow-up    Venous Ultrasound    HPI Karla Patton is a 66 y.o. female is here today for a 2 month venous ultrasound. She is wearing compression stockings but not the same ones everyday. She alternates between the 20 and 40 mm ankle pressure. Patient states she still has some aching and swelling in her left leg. She points to one spot in the left medial leg in the lower third as being particularly sore at times.She also continues to have generalized discomfort in the leg at the end of the day on the left leg In spite of continued use of compression stocking, use of OTC pain meds and avoidance of prolonged sitting or standing she continues to have the same symptoms in the left leg HPI  Past Medical History:  Diagnosis Date  . Back pain   . Diabetes mellitus without complication ([place])   . Thyroid disease   . Varicose vein     Past Surgical History:  Procedure Laterality Date  . ABDOMINAL HYSTERECTOMY  1994  . CHOLECYSTECTOMY  09-07-13  . COLONOSCOPY  2009   normal  . INCONTINENCE SURGERY  2003  . left leg vein ligation  2015  . VARICOSE VEIN SURGERY  1990's   Dr Bary Castilla    Family History  Problem Relation Age of Onset  . Diabetes Mother   . Pancreatitis Mother   . Heart disease Father   . Stroke Father        x 2  . Hypertension Father   . Fibromyalgia Sister   . Scoliosis Sister   . Arthritis Sister   . Diabetes Sister     Social History Social History  Substance Use Topics  . Smoking status: Never Smoker  . Smokeless tobacco: Never Used  . Alcohol use 4.2 oz/week    7 Shots of liquor per week     Comment: 1 airplane bottle of Fireball Whiskey every night    No Known Allergies  Current Outpatient Prescriptions  Medication Sig Dispense Refill  . ALPRAZolam (XANAX) 0.5 MG tablet TAKE 1 TABLET BY MOUTH TWICE DAILY 60 tablet 5  . aspirin  81 MG tablet Take 162 mg by mouth daily.     [name] glucagon (GLUCAGEN) 1 MG SOLR injection Inject 1 mg into the muscle once as needed for low blood sugar. 1 mg 12  . glucose blood (BAYER CONTOUR NEXT TEST) test strip Check blood sugar ACHS 400 each 3  . Insulin Glargine (LANTUS SOLOSTAR) 100 UNIT/ML Solostar Pen Inject 22 Units into the skin daily at 10 pm. 15 mL 5  . Insulin Pen Needle 31G X 8 MM MISC Inject 1 each as directed 4 (four) times daily -  before meals and at bedtime. 100 each 6  . levothyroxine (SYNTHROID, LEVOTHROID) 100 MCG tablet TAKE 1 TABLET BY MOUTH DAILY BEFORE BREAKFAST. 90 tablet 1  . LYRICA 75 MG capsule TAKE 3 CAPSULES BY MOUTH EVERY NIGHT AT BEDTIME 90 capsule 5  . NOVOLOG FLEXPEN 100 UNIT/ML FlexPen INJECT SUBCUTANEOUSLY AS PER SLIDING SCALE BEFORE EACH MEAL 15 mL 5   No current facility-administered medications for this visit.     Review of Systems Review of Systems  Constitutional: Negative.   Respiratory: Negative.   Cardiovascular: Negative.     Blood pressure 124/78, pulse 72, resp. rate 14, height 5\' 4"  (1.626  m), weight 189 lb (85.7 kg).  Physical Exam Physical Exam  Constitutional: She is oriented to person, place, and time. She appears well-developed and well-nourished.  Eyes: No scleral icterus.  Cardiovascular:  Pulses:      Dorsalis pedis pulses are 2+ on the right side, and 2+ on the left side.       Posterior tibial pulses are 2+ on the right side, and 2+ on the left side.  Varicose veins noted, most predominant over left posterior calf and medial thigh. Mild skin pigmentation is noted. Mild to moderate bilateral lower extremity edema. Focal tenderness approximately 7 cm above the medial malleolus of the left leg  Neurological: She is alert and oriented to person, place, and time.  Skin: Skin is warm and dry.    Data Reviewed Prior notes and venous US reviewed.   Assessment    Varicose veins bilaterally. Right leg is only minimally  symptomatic if at all.  Venous US of left lower extremity performed today. No evidence of DVT noted  Reflux of > 2 seconds noted in a perforator vein 7 cm AMM of left leg. This is the exact spot where she feels focal tenderness and pain.  Also noted is what appears to be prominent lateral branch of  GSV in left upper thigh. This seems to connect with the CFV and gives off varicose branches in the junction of middle and upper third of thigh. Abnormal reflux of greater than 2 seconds is noted in this vein   It appears likely that her leg symptoms are related to both the incompetent lateral branch of the GSV in the left upper thigh and the incompetent perforator in the left leg.   Plan   Findings discussed fully with the patient. Feel patient would benefit with the ablation of the lateral branch of the greater saphenous vein on the left leg and also deep incompetent perforator vein in the lower leg. Patient is agreeable to this. We'll initiate request for insurance authorization. Continue wearing compression stockings. Rest and elevate legs as often as possible.    HPI, Physical Exam, Assessment and Plan have been scribed under the direction and in the presence of Mckinley Jewel, MD.  Verlene Mayer, CMA  I have completed the exam and reviewed the above documentation for accuracy and completeness.  I agree with the above.  Haematologist has been used and any errors in dictation or transcription are unintentional.  Aileena Iglesia G. Jamal Collin, M.D., F.A.C.S.      Junie Panning G 09/02/2017, 10:59 AM

## 2017-09-04 ENCOUNTER — Ambulatory Visit
Admission: RE | Admit: 2017-09-04 | Discharge: 2017-09-04 | Disposition: A | Discharge status: Home / Self Care | Payer: BLUE CROSS/BLUE SHIELD | Source: Ambulatory Visit | Attending: Physician Assistant | Admitting: Physician Assistant

## 2017-09-04 DIAGNOSIS — K7689 Other specified diseases of liver: Secondary | ICD-10-CM | POA: Diagnosis present

## 2017-09-04 DIAGNOSIS — R160 Hepatomegaly, not elsewhere classified: Secondary | ICD-10-CM | POA: Insufficient documentation

## 2017-09-09 ENCOUNTER — Telehealth: Payer: Self-pay

## 2017-09-09 NOTE — Telephone Encounter (Signed)
Patient advised as below.  

## 2017-09-09 NOTE — Telephone Encounter (Signed)
-----   Message from Mar Daring, PA-C sent at 09/09/2017  9:11 AM EDT ----- Liver mass is stable and unchanged from previous imaging. It is consistent with a hemangioma on the liver.

## 2017-09-15 ENCOUNTER — Ambulatory Visit: Payer: BLUE CROSS/BLUE SHIELD | Admitting: General Surgery

## 2017-09-21 ENCOUNTER — Other Ambulatory Visit: Payer: Self-pay | Admitting: Physician Assistant

## 2017-09-21 DIAGNOSIS — G629 Polyneuropathy, unspecified: Secondary | ICD-10-CM

## 2017-09-21 MED ORDER — PREGABALIN 75 MG PO CAPS: 225.0000 mg | ORAL_CAPSULE | Freq: Every day | ORAL | 5 refills | Status: DC

## 2017-09-21 NOTE — Telephone Encounter (Signed)
[facility] faxed a refill request on the following medications:  LYRICA 75 MG capsule.  Take 3 capsules by mouth every night at bedtime.  90 day supply.  [facility]

## 2017-09-21 NOTE — Telephone Encounter (Signed)
Please review. Thanks!  

## 2017-09-21 NOTE — Telephone Encounter (Signed)
Called in medication as below.  

## 2017-10-01 ENCOUNTER — Telehealth: Payer: Self-pay | Admitting: General Surgery

## 2017-10-01 NOTE — Telephone Encounter (Signed)
PATIENT CALLED STATING BCBS DENIED PROCEDURE.IF YOU'RE NOT ABLE TO GET IT APPROVED SHE STARTS MEDICARE IN 2WKS WITH A SUPPLEMENT. I HAVE ENTERED THE INS IN THE SYSTEM. THE PATIENT WILL BRING THE CARDS BY.INFOMATION WAS TAKEN BY PHONE.

## 2017-10-12 DIAGNOSIS — H9319 Tinnitus, unspecified ear: Secondary | ICD-10-CM | POA: Diagnosis not present

## 2017-10-12 DIAGNOSIS — H903 Sensorineural hearing loss, bilateral: Secondary | ICD-10-CM | POA: Diagnosis not present

## 2017-10-15 ENCOUNTER — Telehealth: Payer: Self-pay | Admitting: *Deleted

## 2017-10-15 NOTE — Telephone Encounter (Signed)
She will be out of town Dec 11 week, prefers 17 week

## 2017-10-15 NOTE — Telephone Encounter (Signed)
I called to ask about recent insurance changes(Medicare) to see about vein procedure possibly being Nov 27, with u/s 29 and Dec 11, with u/s 13

## 2017-10-19 ENCOUNTER — Other Ambulatory Visit: Payer: Self-pay | Admitting: Physician Assistant

## 2017-10-19 DIAGNOSIS — E119 Type 2 diabetes mellitus without complications: Secondary | ICD-10-CM

## 2017-10-19 DIAGNOSIS — F411 Generalized anxiety disorder: Secondary | ICD-10-CM

## 2017-10-19 DIAGNOSIS — Z794 Long term (current) use of insulin: Principal | ICD-10-CM

## 2017-10-20 ENCOUNTER — Telehealth: Payer: Self-pay | Admitting: Physician Assistant

## 2017-10-20 DIAGNOSIS — E1140 Type 2 diabetes mellitus with diabetic neuropathy, unspecified: Secondary | ICD-10-CM

## 2017-10-20 DIAGNOSIS — Z794 Long term (current) use of insulin: Principal | ICD-10-CM

## 2017-10-20 NOTE — Telephone Encounter (Signed)
Pt states the Rx pregabalin (LYRICA) 75 MG capsule is going to be $472.00 a month.  Pt is requesting something different, pt is asking if she can change to gabapentin.  [facility].  LK#[phone]/QD

## 2017-10-20 NOTE — Telephone Encounter (Signed)
Please Review.  Thanks,  -Joseline 

## 2017-10-20 NOTE — Telephone Encounter (Signed)
Called in to [facility] in [place] as above.

## 2017-10-21 MED ORDER — GABAPENTIN 300 MG PO CAPS: 300.0000 mg | ORAL_CAPSULE | Freq: Every day | ORAL | 0 refills | Status: DC

## 2017-10-21 NOTE — Telephone Encounter (Signed)
One month of gabapentin 300mg  to be taken at bedtime sent to [facility]. She will need to call if this does not control her symptoms so we can dose adjust as needed.   If she has pregablin left she should taper down over the next week, taking 2 tabs at bedtime x 2-3 days, then 1 tab x 3-4 days, then switching to gabapentin.

## 2017-10-22 NOTE — Telephone Encounter (Signed)
Vein closure procedure for Nov 27 at 3:30 and f/u Nov 29 at 11:00,. Pt agrees. Products ordered through [employer] (Rep: [employer])

## 2017-10-22 NOTE — Telephone Encounter (Signed)
Pt informed and voiced understanding of results. 

## 2017-11-04 DIAGNOSIS — M9901 Segmental and somatic dysfunction of cervical region: Secondary | ICD-10-CM | POA: Diagnosis not present

## 2017-11-04 DIAGNOSIS — M530 Cervicocranial syndrome: Secondary | ICD-10-CM | POA: Diagnosis not present

## 2017-11-17 ENCOUNTER — Ambulatory Visit (INDEPENDENT_AMBULATORY_CARE_PROVIDER_SITE_OTHER): Payer: Medicare Other | Admitting: General Surgery

## 2017-11-17 ENCOUNTER — Other Ambulatory Visit: Payer: Self-pay

## 2017-11-17 ENCOUNTER — Ambulatory Visit: Payer: Self-pay

## 2017-11-17 ENCOUNTER — Encounter: Payer: Self-pay | Admitting: General Surgery

## 2017-11-17 VITALS — BP 150/80 | HR 76 | Resp 14 | Ht 64.0 in | Wt 186.0 lb

## 2017-11-17 DIAGNOSIS — I83892 Varicose veins of left lower extremities with other complications: Secondary | ICD-10-CM

## 2017-11-17 HISTORY — PX: VEIN SURGERY: SHX48

## 2017-11-17 NOTE — Patient Instructions (Signed)
Stay moving tofday Use couch or recliner

## 2017-11-17 NOTE — Progress Notes (Signed)
Patient ID: JOSHUA ZERINGUE, female   DOB: 1951-08-14, 66 y.o.   MRN: [id number]  Chief Complaint  Patient presents with  . Procedure    HPI Karla Patton is a 66 y.o. female here today for her left GSV(lateral branch) RF ablation. HPI  Past Medical History:  Diagnosis Date  . Back pain   . Diabetes mellitus without complication ([place])   . Thyroid disease   . Varicose vein     Past Surgical History:  Procedure Laterality Date  . ABDOMINAL HYSTERECTOMY  1994  . CHOLECYSTECTOMY  09-07-13  . COLONOSCOPY  2009   normal  . INCONTINENCE SURGERY  2003  . left leg vein ligation  2015  . VARICOSE VEIN SURGERY  1990's   Dr Bary Castilla    Family History  Problem Relation Age of Onset  . Diabetes Mother   . Pancreatitis Mother   . Heart disease Father   . Stroke Father        x 2  . Hypertension Father   . Fibromyalgia Sister   . Scoliosis Sister   . Arthritis Sister   . Diabetes Sister     Social History Social History   Tobacco Use  . Smoking status: Never Smoker  . Smokeless tobacco: Never Used  Substance Use Topics  . Alcohol use: Yes    Alcohol/week: 4.2 oz    Types: 7 Shots of liquor per week    Comment: 1 airplane bottle of Fireball Whiskey every night  . Drug use: No    No Known Allergies  Current Outpatient Medications  Medication Sig Dispense Refill  . ALPRAZolam (XANAX) 0.5 MG tablet TAKE 1 TABLET BY MOUTH TWICE DAILY 60 tablet 5  . aspirin 81 MG tablet Take 162 mg by mouth daily.     [name] gabapentin (NEURONTIN) 300 MG capsule Take 1 capsule (300 mg total) by mouth at bedtime. 30 capsule 0  . GLUCAGON EMERGENCY 1 MG injection INJECT 1 MG INTO THE MUSCLE ONCE AS NEEDED FOR LOW BLOOD SUGAR 1 kit 0  . glucose blood (BAYER CONTOUR NEXT TEST) test strip Check blood sugar ACHS 400 each 3  . Insulin Glargine (LANTUS SOLOSTAR) 100 UNIT/ML Solostar Pen Inject 22 Units into the skin daily at 10 pm. 15 mL 5  . Insulin Pen Needle 31G X 8 MM MISC Inject 1 each as directed  4 (four) times daily -  before meals and at bedtime. 100 each 6  . levothyroxine (SYNTHROID, LEVOTHROID) 100 MCG tablet TAKE 1 TABLET BY MOUTH DAILY BEFORE BREAKFAST. 90 tablet 1  . NOVOLOG FLEXPEN 100 UNIT/ML FlexPen INJECT SUBCUTANEOUSLY AS PER SLIDING SCALE BEFORE EACH MEAL 15 mL 5   No current facility-administered medications for this visit.     Review of Systems Review of Systems  Constitutional: Negative.   Respiratory: Negative.   Cardiovascular: Negative.     Blood pressure (!) 150/80, pulse 76, resp. rate 14, height _0  (1.626 m), weight 186 lb (84.4 kg).  Physical Exam Physical Exam  Data Reviewed  Prior notes and duplex study Assessment    VV, symptomatic on left with stasis changes and pain    Plan    ENDOVENOUS RADIOFREQUENCY ABLATION REPORT  Name:  Karla Patton Dick DOB:  Jan 22, 1951  Diagnosis:  Reflux with multiple tributary varicosities on left GSV-lateral branch  Operation: Endovenous radiofrequency ablation of the GSV-lateral branch Vital signs:BP (!) 150/80   Pulse 76   Resp 14   Ht _1  (1.626 m)  Wt 186 lb (84.4 kg)   BMI 31.93 kg/m   Anesthesia:   Local infiltration of 59m 1% Xylocaine and 1231mof tumenscence  Procedure:  The patient was transferred to the procedure suite and the insufficient vein was mapped by ultrasound and marked on the overlying skin. The patient was then positioned supine on the procedure table.  The limb was sterilely prepped and draped. The RF   catheter was placed in a sterile field.  The patient was placed in reverse-Trendelenburg position and local anesthesia was instilled in the skin overlying the access site. A skin incision was made overlying the identified and mapped entry site. The vein was punctured through the incision, and using ultrasound guidance and the Seldinger technique, a guidewire was introduced through the needle, which was then exchanged over the guidewire for a sheath. The guidewire was removed  and the sheath was flushed. The RF probe was placed into the vein through the sheath and positioned at a point just distal (about 2 cm) to the junction using ultrasound guidance.  After the RF probe position was verified by ultrasound, tumescent anesthesia was infiltrated, under ultrasound guidance, precisely into the perivenous compartment along the entire length of the vein from the entry site to the saphenofemoral junction until a "halo" of fluid was noted from the vein.  The patient was then placed in Trendelenburg position. RF energy was applied to the 6 segments of 2.5 cm each.  Repeat ultrasound of the saphenous vein was performed, confirming successful treatment. The catheter and sheath were withdrawn and hemostasis established with direct pressure.  After assuring hemostasis, the skin incision over the saphenous vein was closed with a bandage and a compression wrap. Patient experienced no immediate complications. Pt advised to follow up in 2 days. Avoid prolonged standing/sitting  CC:  BuMar DaringPA-C I have completed the exam and reviewed the above documentation for accuracy and completeness.  I agree with the above.  DrHaematologistas been used and any errors in dictation or transcription are unintentional.  Jamile Sivils G. SaJamal CollinM.D., F.A.C.S.  SAJunie Panning Christene Lye 11/18/2017, 4:39 PM

## 2017-11-18 ENCOUNTER — Encounter: Payer: Self-pay | Admitting: General Surgery

## 2017-11-19 ENCOUNTER — Encounter: Payer: Self-pay | Admitting: General Surgery

## 2017-11-19 ENCOUNTER — Other Ambulatory Visit: Payer: Self-pay

## 2017-11-19 ENCOUNTER — Ambulatory Visit: Payer: Self-pay

## 2017-11-19 ENCOUNTER — Ambulatory Visit (INDEPENDENT_AMBULATORY_CARE_PROVIDER_SITE_OTHER): Payer: Medicare Other | Admitting: General Surgery

## 2017-11-19 VITALS — BP 126/82 | HR 80 | Resp 12 | Ht 64.0 in | Wt 182.0 lb

## 2017-11-19 DIAGNOSIS — I83892 Varicose veins of left lower extremities with other complications: Secondary | ICD-10-CM

## 2017-11-19 NOTE — Patient Instructions (Signed)
The patient is aware to call back for any questions or concerns.  

## 2017-11-19 NOTE — Progress Notes (Signed)
Patient ID: Karla Patton, female   DOB: [birth year], 66 y.o.   MRN: [id number]  Chief Complaint  Patient presents with  . Routine Post Op    HPI Karla Patton is a 66 y.o. female here today for her post op left vein ablation done 2 days ago HPI  Past Medical History:  Diagnosis Date  . Back pain   . Diabetes mellitus without complication ([place])   . Thyroid disease   . Varicose vein     Past Surgical History:  Procedure Laterality Date  . ABDOMINAL HYSTERECTOMY  1994  . CHOLECYSTECTOMY  09-07-13  . COLONOSCOPY  2009   normal  . INCONTINENCE SURGERY  2003  . left leg vein ligation  2015  . VARICOSE VEIN SURGERY  1990's   Dr Bary Castilla  . VEIN SURGERY Left 11/17/2017   vein closure procedure/Dr Jamal Collin    Family History  Problem Relation Age of Onset  . Diabetes Mother   . Pancreatitis Mother   . Heart disease Father   . Stroke Father        x 2  . Hypertension Father   . Fibromyalgia Sister   . Scoliosis Sister   . Arthritis Sister   . Diabetes Sister     Social History Social History   Tobacco Use  . Smoking status: Never Smoker  . Smokeless tobacco: Never Used  Substance Use Topics  . Alcohol use: Yes    Alcohol/week: 4.2 oz    Types: 7 Shots of liquor per week    Comment: 1 airplane bottle of Fireball Whiskey every night  . Drug use: No    No Known Allergies  Current Outpatient Medications  Medication Sig Dispense Refill  . ALPRAZolam (XANAX) 0.5 MG tablet TAKE 1 TABLET BY MOUTH TWICE DAILY 60 tablet 5  . aspirin 81 MG tablet Take 162 mg by mouth daily.     [name] gabapentin (NEURONTIN) 300 MG capsule Take 1 capsule (300 mg total) by mouth at bedtime. 30 capsule 0  . GLUCAGON EMERGENCY 1 MG injection INJECT 1 MG INTO THE MUSCLE ONCE AS NEEDED FOR LOW BLOOD SUGAR 1 kit 0  . glucose blood (BAYER CONTOUR NEXT TEST) test strip Check blood sugar ACHS 400 each 3  . Insulin Glargine (LANTUS SOLOSTAR) 100 UNIT/ML Solostar Pen Inject 22 Units into the skin daily at  10 pm. 15 mL 5  . Insulin Pen Needle 31G X 8 MM MISC Inject 1 each as directed 4 (four) times daily -  before meals and at bedtime. 100 each 6  . levothyroxine (SYNTHROID, LEVOTHROID) 100 MCG tablet TAKE 1 TABLET BY MOUTH DAILY BEFORE BREAKFAST. 90 tablet 1  . NOVOLOG FLEXPEN 100 UNIT/ML FlexPen INJECT SUBCUTANEOUSLY AS PER SLIDING SCALE BEFORE EACH MEAL 15 mL 5   No current facility-administered medications for this visit.     Review of Systems Review of Systems  Constitutional: Negative.   Respiratory: Negative.   Cardiovascular: Negative.     Blood pressure 126/82, pulse 80, resp. rate 12, height '5\' 4"'$  (1.626 m), weight 182 lb (82.6 kg).  Physical Exam Physical Exam  Constitutional: She is oriented to person, place, and time. She appears well-developed and well-nourished.  Neurological: She is alert and oriented to person, place, and time.  Skin: Skin is warm and dry.  Psychiatric: Her behavior is normal.  There is very mild ecchymosis noted around the puncture sites in the left upper thigh.  No seroma hematoma or signs of infection Duplex  scan of the left common femoral showed it to be patent with normal flow.  The treated lateral branch of the greater saphenous vein is noncompressible and with no flow noted within.  Data Reviewed none  Assessment    2 days post RF ablation of the left greater saphenous veins lateral branch.  Ablation appears to be satisfactory with no complications identified.    Plan    Follow up in 2 weeks.  Patient advised to use the compression stockings daily    HPI, Physical Exam, Assessment and Plan have been scribed under the direction and in the presence of Mckinley Jewel, MD Karie Fetch, RN  I have completed the exam and reviewed the above documentation for accuracy and completeness.  I agree with the above.  Haematologist has been used and any errors in dictation or transcription are unintentional.  Jeroline Wolbert G. Jamal Collin, M.D.,  F.A.C.S.  Junie Panning G 11/23/2017, 2:16 PM

## 2017-11-25 ENCOUNTER — Other Ambulatory Visit: Payer: Self-pay | Admitting: Physician Assistant

## 2017-11-25 DIAGNOSIS — Z794 Long term (current) use of insulin: Principal | ICD-10-CM

## 2017-11-25 DIAGNOSIS — E119 Type 2 diabetes mellitus without complications: Secondary | ICD-10-CM

## 2017-11-26 ENCOUNTER — Other Ambulatory Visit: Payer: Self-pay | Admitting: Physician Assistant

## 2017-11-26 DIAGNOSIS — Z794 Long term (current) use of insulin: Principal | ICD-10-CM

## 2017-11-26 DIAGNOSIS — E1140 Type 2 diabetes mellitus with diabetic neuropathy, unspecified: Secondary | ICD-10-CM

## 2017-12-02 DIAGNOSIS — M9901 Segmental and somatic dysfunction of cervical region: Secondary | ICD-10-CM | POA: Diagnosis not present

## 2017-12-02 DIAGNOSIS — M530 Cervicocranial syndrome: Secondary | ICD-10-CM | POA: Diagnosis not present

## 2017-12-03 ENCOUNTER — Other Ambulatory Visit: Payer: Self-pay

## 2017-12-03 ENCOUNTER — Encounter: Payer: Self-pay | Admitting: General Surgery

## 2017-12-03 ENCOUNTER — Ambulatory Visit (INDEPENDENT_AMBULATORY_CARE_PROVIDER_SITE_OTHER): Payer: Medicare Other | Admitting: General Surgery

## 2017-12-03 VITALS — BP 128/64 | HR 84 | Resp 14 | Ht 65.5 in | Wt 186.0 lb

## 2017-12-03 DIAGNOSIS — I83892 Varicose veins of left lower extremities with other complications: Secondary | ICD-10-CM | POA: Diagnosis not present

## 2017-12-03 NOTE — Patient Instructions (Signed)
Return as needed

## 2017-12-03 NOTE — Progress Notes (Signed)
Patient ID: Karla Patton, female   DOB: [birth year], 66 y.o.   MRN: [id number]  Chief Complaint  Patient presents with  . Follow-up    HPI Karla Patton is a 66 y.o. female here today for her two week follow up. She does admit to having pain at the knee when she wears the  Thigh length stockings, "too tight".Howevere she has no swelling in left leg and prior skin induration and thickening lower leg has resolved.  HPI  Past Medical History:  Diagnosis Date  . Back pain   . Diabetes mellitus without complication ([place])   . Thyroid disease   . Varicose vein     Past Surgical History:  Procedure Laterality Date  . ABDOMINAL HYSTERECTOMY  1994  . CHOLECYSTECTOMY  09-07-13  . COLONOSCOPY  2009   normal  . INCONTINENCE SURGERY  2003  . left leg vein ligation  2015  . VARICOSE VEIN SURGERY  1990's   Dr Bary Castilla  . VEIN SURGERY Left 11/17/2017   vein closure procedure/Dr Jamal Collin    Family History  Problem Relation Age of Onset  . Diabetes Mother   . Pancreatitis Mother   . Heart disease Father   . Stroke Father        x 2  . Hypertension Father   . Fibromyalgia Sister   . Scoliosis Sister   . Arthritis Sister   . Diabetes Sister     Social History Social History   Tobacco Use  . Smoking status: Never Smoker  . Smokeless tobacco: Never Used  Substance Use Topics  . Alcohol use: Yes    Alcohol/week: 4.2 oz    Types: 7 Shots of liquor per week    Comment: 1 airplane bottle of Fireball Whiskey every night  . Drug use: No    No Known Allergies  Current Outpatient Medications  Medication Sig Dispense Refill  . ALPRAZolam (XANAX) 0.5 MG tablet TAKE 1 TABLET BY MOUTH TWICE DAILY 60 tablet 5  . aspirin 81 MG tablet Take 162 mg by mouth daily.     . B-D ULTRAFINE III SHORT PEN 31G X 8 MM MISC USE AS DIRECTED FOUR TIMES DAILY, BEFORE MEALS AND AT BEDTIME 100 each 3  . gabapentin (NEURONTIN) 300 MG capsule TAKE 1 CAPSULE(300 MG) BY MOUTH AT BEDTIME 90 capsule 1  .  GLUCAGON EMERGENCY 1 MG injection INJECT 1 MG INTO THE MUSCLE ONCE AS NEEDED FOR LOW BLOOD SUGAR 1 kit 0  . glucose blood (BAYER CONTOUR NEXT TEST) test strip Check blood sugar ACHS 400 each 3  . Insulin Glargine (LANTUS SOLOSTAR) 100 UNIT/ML Solostar Pen Inject 22 Units into the skin daily at 10 pm. 15 mL 5  . levothyroxine (SYNTHROID, LEVOTHROID) 100 MCG tablet TAKE 1 TABLET BY MOUTH DAILY BEFORE BREAKFAST. 90 tablet 1  . NOVOLOG FLEXPEN 100 UNIT/ML FlexPen INJECT SUBCUTANEOUSLY AS PER SLIDING SCALE BEFORE EACH MEAL 15 mL 5   No current facility-administered medications for this visit.     Review of Systems Review of Systems  Constitutional: Negative.   Respiratory: Negative.   Cardiovascular: Negative.     Blood pressure 128/64, pulse 84, resp. rate 14, height 5' 5.5" (1.664 m), weight 186 lb (84.4 kg).  Physical Exam Physical Exam  Constitutional: She is oriented to person, place, and time. She appears well-developed and well-nourished.  Neurological: She is alert and oriented to person, place, and time.  Skin: Skin is warm and dry.  No edema noted. Stasis changes  inner left leg have resolved fully-skin is soft and normal in feel. No tenderness.  Limited Duplex study shows left CFV is patent with no clots seen. Treated lateral branch of the GSV remains closed Data Reviewed Prior notes reviewed   Assessment    Stable exam. Good results post RF ablation of left ALV    Plan       Patient to return as needed. Advised to continue knee length stockings  HPI, Physical Exam, Assessment and Plan have been scribed under the direction and in the presence of Mckinley Jewel, MD Karie Fetch, RN  I have completed the exam and reviewed the above documentation for accuracy and completeness.  I agree with the above.  Haematologist has been used and any errors in dictation or transcription are unintentional.  Seeplaputhur G. Jamal Collin, M.D., F.A.C.S.  Junie Panning  G 12/03/2017, 4:24 PM

## 2017-12-04 ENCOUNTER — Telehealth: Payer: Self-pay | Admitting: Physician Assistant

## 2017-12-04 ENCOUNTER — Encounter: Payer: Self-pay | Admitting: Physician Assistant

## 2017-12-04 ENCOUNTER — Ambulatory Visit (INDEPENDENT_AMBULATORY_CARE_PROVIDER_SITE_OTHER): Payer: Medicare Other | Admitting: Physician Assistant

## 2017-12-04 VITALS — BP 130/72 | HR 74 | Temp 98.3°F | Resp 16 | Ht 64.0 in | Wt 187.0 lb

## 2017-12-04 DIAGNOSIS — E11319 Type 2 diabetes mellitus with unspecified diabetic retinopathy without macular edema: Secondary | ICD-10-CM

## 2017-12-04 DIAGNOSIS — Z13820 Encounter for screening for osteoporosis: Secondary | ICD-10-CM | POA: Diagnosis not present

## 2017-12-04 DIAGNOSIS — E1165 Type 2 diabetes mellitus with hyperglycemia: Secondary | ICD-10-CM | POA: Diagnosis not present

## 2017-12-04 DIAGNOSIS — Z780 Asymptomatic menopausal state: Secondary | ICD-10-CM

## 2017-12-04 DIAGNOSIS — E1121 Type 2 diabetes mellitus with diabetic nephropathy: Secondary | ICD-10-CM | POA: Diagnosis not present

## 2017-12-04 DIAGNOSIS — Z794 Long term (current) use of insulin: Secondary | ICD-10-CM | POA: Diagnosis not present

## 2017-12-04 DIAGNOSIS — IMO0002 Reserved for concepts with insufficient information to code with codable children: Secondary | ICD-10-CM | POA: Insufficient documentation

## 2017-12-04 LAB — POCT UA - MICROALBUMIN: Microalbumin Ur, POC: 50 mg/L

## 2017-12-04 LAB — POCT GLYCOSYLATED HEMOGLOBIN (HGB A1C)
Est. average glucose Bld gHb Est-mCnc: 203
Hemoglobin A1C: 8.7

## 2017-12-04 MED ORDER — FREESTYLE LIBRE 14 DAY SENSOR MISC: 1.0000 | Freq: Three times a day (TID) | 0 refills | Status: DC

## 2017-12-04 MED ORDER — FREESTYLE LIBRE 14 DAY READER DEVI: 1.0000 | Freq: Three times a day (TID) | 0 refills | Status: DC

## 2017-12-04 MED ORDER — LISINOPRIL 10 MG PO TABS: 10.0000 mg | ORAL_TABLET | Freq: Every day | ORAL | 1 refills | Status: DC

## 2017-12-04 NOTE — Telephone Encounter (Signed)
Pt called saying [facility] in [place] told her that they would have to have a PA for the Glucose Meter that you ordered her to have.  Pt was in earlier today    Pt does want to use this new meter but can not afford to pay for it.  Her call back number is [phone]  Thanks teri

## 2017-12-04 NOTE — Telephone Encounter (Signed)
Please review-Karla Patton, RMA  

## 2017-12-04 NOTE — Telephone Encounter (Signed)
Once we receive PA form we will send to insurance to see if we can get it covered.  Josie,  Just an FYI to be on the lookout for this. Thanks.

## 2017-12-04 NOTE — Progress Notes (Signed)
Patient: Karla Patton Female    DOB: 1951-05-06   66 y.o.   MRN: [id number] Visit Date: 12/04/2017  Today's Provider: Mar Daring, PA-C   Chief Complaint  Patient presents with  . Diabetes   Subjective:    HPI  Diabetes Mellitus Type II, Follow-up:   Lab Results  Component Value Date   HGBA1C 8.7 12/04/2017   HGBA1C 8.4 08/28/2017   HGBA1C 8.1 (H) 05/29/2017    Last seen for diabetes 3 months ago.  Management since then includes checking A1c. She reports excellent compliance with treatment. She is not having side effects.  Current symptoms include none and have been stable. Home blood sugar records: fasting range: 100  Episodes of hypoglycemia? no   Current Insulin Regimen: as directed  Most Recent Eye Exam: UTD Weight trend: stable Prior visit with dietician: no Current diet: in general, a "healthy" diet   Current exercise: housecleaning  Pertinent Labs:    Component Value Date/Time   CHOL 208 (H) 05/29/2017 1046   TRIG 53 05/29/2017 1046   HDL 66 05/29/2017 1046   LDLCALC 131 (H) 05/29/2017 1046   CREATININE 0.68 05/29/2017 1046    Wt Readings from Last 3 Encounters:  12/04/17 187 lb (84.8 kg)  12/03/17 186 lb (84.4 kg)  11/19/17 182 lb (82.6 kg)   ------------------------------------------------------------------------     No Known Allergies   Current Outpatient Medications:  .  ALPRAZolam (XANAX) 0.5 MG tablet, TAKE 1 TABLET BY MOUTH TWICE DAILY, Disp: 60 tablet, Rfl: 5 .  aspirin 81 MG tablet, Take 162 mg by mouth daily. , Disp: , Rfl:  .  B-D ULTRAFINE III SHORT PEN 31G X 8 MM MISC, USE AS DIRECTED FOUR TIMES DAILY, BEFORE MEALS AND AT BEDTIME, Disp: 100 each, Rfl: 3 .  gabapentin (NEURONTIN) 300 MG capsule, TAKE 1 CAPSULE(300 MG) BY MOUTH AT BEDTIME, Disp: 90 capsule, Rfl: 1 .  GLUCAGON EMERGENCY 1 MG injection, INJECT 1 MG INTO THE MUSCLE ONCE AS NEEDED FOR LOW BLOOD SUGAR, Disp: 1 kit, Rfl: 0 .  glucose blood (BAYER  CONTOUR NEXT TEST) test strip, Check blood sugar ACHS, Disp: 400 each, Rfl: 3 .  Insulin Glargine (LANTUS SOLOSTAR) 100 UNIT/ML Solostar Pen, Inject 22 Units into the skin daily at 10 pm., Disp: 15 mL, Rfl: 5 .  levothyroxine (SYNTHROID, LEVOTHROID) 100 MCG tablet, TAKE 1 TABLET BY MOUTH DAILY BEFORE BREAKFAST., Disp: 90 tablet, Rfl: 1 .  NOVOLOG FLEXPEN 100 UNIT/ML FlexPen, INJECT SUBCUTANEOUSLY AS PER SLIDING SCALE BEFORE EACH MEAL, Disp: 15 mL, Rfl: 5  Review of Systems  Constitutional: Negative.   HENT: Negative.   Respiratory: Positive for cough (improving with Mucinex).   Cardiovascular: Negative.   Gastrointestinal: Negative.   Endocrine: Negative.   Neurological: Positive for numbness (neuropathy, chronic, on gabapentin). Negative for weakness.    Social History   Tobacco Use  . Smoking status: Never Smoker  . Smokeless tobacco: Never Used  Substance Use Topics  . Alcohol use: Yes    Alcohol/week: 4.2 oz    Types: 7 Shots of liquor per week    Comment: 1 airplane bottle of Fireball Whiskey every night   Objective:   BP 130/72 (BP Location: Left Arm, Patient Position: Sitting, Cuff Size: Large)   Pulse 74   Temp 98.3 F (36.8 C) (Oral)   Resp 16   Ht '5\' 4"'$  (1.626 m)   Wt 187 lb (84.8 kg)   SpO2 97%  BMI 32.10 kg/m  Vitals:   12/04/17 1109  BP: 130/72  Pulse: 74  Resp: 16  Temp: 98.3 F (36.8 C)  TempSrc: Oral  SpO2: 97%  Weight: 187 lb (84.8 kg)  Height: '5\' 4"'$  (1.626 m)     Physical Exam  Constitutional: She appears well-developed and well-nourished. No distress.  Neck: Normal range of motion. Neck supple. No JVD present. No tracheal deviation present. No thyromegaly present.  Cardiovascular: Normal rate, regular rhythm and normal heart sounds. Exam reveals no gallop and no friction rub.  No murmur heard. Pulmonary/Chest: Effort normal and breath sounds normal. No respiratory distress. She has no wheezes. She has no rales.  Musculoskeletal: She  exhibits no edema (in compression stockings today and doing well;s/p ablation with Dr. Jamal Collin).  Lymphadenopathy:    She has no cervical adenopathy.  Skin: She is not diaphoretic.  Vitals reviewed.      Assessment & Plan:     1. Type 2 diabetes mellitus with retinopathy of both eyes, with long-term current use of insulin, macular edema presence unspecified, unspecified retinopathy severity ([place]) Microalbumin up to 50 from 20 last year. Lisinopril added as below. A1c up to 8.7 from 8.4. Patient does admit to not taking her sliding scale novolog and not checking her sugar before meals often. She does check her blood sugar when she feels it is getting low and before bedtime. If her blood sugar is less than 150 before bedtime she will continue to take glucagon tabs until it gets higher and then take her lantus dose before falling asleep. She is also interested in getting the [facility] which was ordered as below. Advised to start checking more before meals to add the sliding scale of novolog to help lower sugars and keep her A1c down. Patient also not completely compliant with dietary habits. She also does continue to drink at least one alcoholic beverage nightly and eat sweets occasionally. If A1c continues to increase may have to consider referral to endocrine for better management.  - POCT UA - Microalbumin - POCT glycosylated hemoglobin (Hb A1C) - Continuous Blood Gluc Sensor (FREESTYLE LIBRE 14 DAY SENSOR) MISC; 1 each by Does not apply route 4 (four) times daily -  before meals and at bedtime.  Dispense: 1 each; Refill: 0 - Continuous Blood Gluc Receiver (FREESTYLE LIBRE 14 DAY READER) DEVI; 1 each by Does not apply route 4 (four) times daily -  before meals and at bedtime.  Dispense: 1 Device; Refill: 0  2. Uncontrolled type 2 diabetes mellitus with microalbuminuric diabetic nephropathy ([place]) See above medical treatment plan. - lisinopril (PRINIVIL,ZESTRIL) 10 MG tablet; Take 1 tablet (10  mg total) by mouth daily.  Dispense: 90 tablet; Refill: 1 - Continuous Blood Gluc Sensor (FREESTYLE LIBRE 14 DAY SENSOR) MISC; 1 each by Does not apply route 4 (four) times daily -  before meals and at bedtime.  Dispense: 1 each; Refill: 0 - Continuous Blood Gluc Receiver (FREESTYLE LIBRE 14 DAY READER) DEVI; 1 each by Does not apply route 4 (four) times daily -  before meals and at bedtime.  Dispense: 1 Device; Refill: 0  3. Postmenopause Patient due for BMD. This is ordered as below. Patient will need [place] to Medicare physical in June 2019, patient preference to keep physical around same time yearly.  - DG Bone Density; Future  4. Osteoporosis screening See above medical treatment plan.       Mar Daring, PA-C  [facility]

## 2017-12-08 NOTE — Telephone Encounter (Signed)
Received request called the number and representative said as of right now Medicare part D or B(?) does not cover this meter and to go to durable medical equipment supply store to see if she can get it there. Patient advised she said she would check and let us know if she wants to proceed. For now she will use [employer] meter. -Kris Mouton, RMA

## 2017-12-29 DIAGNOSIS — M531 Cervicobrachial syndrome: Secondary | ICD-10-CM | POA: Diagnosis not present

## 2017-12-29 DIAGNOSIS — M9901 Segmental and somatic dysfunction of cervical region: Secondary | ICD-10-CM | POA: Diagnosis not present

## 2018-01-08 ENCOUNTER — Other Ambulatory Visit: Payer: Self-pay | Admitting: Physician Assistant

## 2018-01-08 DIAGNOSIS — E1142 Type 2 diabetes mellitus with diabetic polyneuropathy: Secondary | ICD-10-CM

## 2018-01-08 DIAGNOSIS — E039 Hypothyroidism, unspecified: Secondary | ICD-10-CM

## 2018-01-08 DIAGNOSIS — Z794 Long term (current) use of insulin: Secondary | ICD-10-CM

## 2018-01-12 DIAGNOSIS — M9901 Segmental and somatic dysfunction of cervical region: Secondary | ICD-10-CM | POA: Diagnosis not present

## 2018-01-12 DIAGNOSIS — M531 Cervicobrachial syndrome: Secondary | ICD-10-CM | POA: Diagnosis not present

## 2018-01-13 ENCOUNTER — Encounter: Payer: Self-pay | Admitting: Physician Assistant

## 2018-01-13 ENCOUNTER — Ambulatory Visit
Admission: RE | Admit: 2018-01-13 | Discharge: 2018-01-13 | Disposition: A | Discharge status: Home / Self Care | Payer: Medicare Other | Source: Ambulatory Visit | Attending: Physician Assistant | Admitting: Physician Assistant

## 2018-01-13 DIAGNOSIS — Z13820 Encounter for screening for osteoporosis: Secondary | ICD-10-CM | POA: Diagnosis not present

## 2018-01-13 DIAGNOSIS — Z780 Asymptomatic menopausal state: Secondary | ICD-10-CM | POA: Insufficient documentation

## 2018-01-13 DIAGNOSIS — M8589 Other specified disorders of bone density and structure, multiple sites: Secondary | ICD-10-CM | POA: Diagnosis not present

## 2018-01-13 DIAGNOSIS — M8580 Other specified disorders of bone density and structure, unspecified site: Secondary | ICD-10-CM | POA: Insufficient documentation

## 2018-01-13 DIAGNOSIS — M8588 Other specified disorders of bone density and structure, other site: Secondary | ICD-10-CM | POA: Insufficient documentation

## 2018-01-13 DIAGNOSIS — M85851 Other specified disorders of bone density and structure, right thigh: Secondary | ICD-10-CM | POA: Diagnosis not present

## 2018-01-13 NOTE — Progress Notes (Signed)
Advised  ED 

## 2018-01-27 ENCOUNTER — Ambulatory Visit (INDEPENDENT_AMBULATORY_CARE_PROVIDER_SITE_OTHER): Payer: Medicare Other | Admitting: Physician Assistant

## 2018-01-27 ENCOUNTER — Encounter: Payer: Self-pay | Admitting: Physician Assistant

## 2018-01-27 VITALS — BP 130/70 | HR 66 | Temp 98.4°F | Resp 16 | Wt 184.4 lb

## 2018-01-27 DIAGNOSIS — R0789 Other chest pain: Secondary | ICD-10-CM

## 2018-01-27 DIAGNOSIS — R51 Headache: Secondary | ICD-10-CM | POA: Diagnosis not present

## 2018-01-27 DIAGNOSIS — G8929 Other chronic pain: Secondary | ICD-10-CM

## 2018-01-27 DIAGNOSIS — R2681 Unsteadiness on feet: Secondary | ICD-10-CM

## 2018-01-27 DIAGNOSIS — R519 Headache, unspecified: Secondary | ICD-10-CM

## 2018-01-27 DIAGNOSIS — R5383 Other fatigue: Secondary | ICD-10-CM

## 2018-01-27 NOTE — Patient Instructions (Signed)
Dizziness °Dizziness is a common problem. It is a feeling of unsteadiness or light-headedness. You may feel like you are about to faint. Dizziness can lead to injury if you stumble or fall. Anyone can become dizzy, but dizziness is more common in older adults. This condition can be caused by a number of things, including medicines, dehydration, or illness. °Follow these instructions at home: °Eating and drinking °· Drink enough fluid to keep your urine clear or pale yellow. This helps to keep you from becoming dehydrated. Try to drink more clear fluids, such as water. °· Do not drink alcohol. °· Limit your caffeine intake if told to do so by your health care provider. Check ingredients and nutrition facts to see if a food or beverage contains caffeine. °· Limit your salt (sodium) intake if told to do so by your health care provider. Check ingredients and nutrition facts to see if a food or beverage contains sodium. °Activity °· Avoid making quick movements. °? Rise slowly from chairs and steady yourself until you feel okay. °? In the morning, first sit up on the side of the bed. When you feel okay, stand slowly while you hold onto something until you know that your balance is fine. °· If you need to stand in one place for a long time, move your legs often. Tighten and relax the muscles in your legs while you are standing. °· Do not drive or use heavy machinery if you feel dizzy. °· Avoid bending down if you feel dizzy. Place items in your home so that they are easy for you to reach without leaning over. °Lifestyle °· Do not use any products that contain nicotine or tobacco, such as cigarettes and e-cigarettes. If you need help quitting, ask your health care provider. °· Try to reduce your stress level by using methods such as yoga or meditation. Talk with your health care provider if you need help to manage your stress. °General instructions °· Watch your dizziness for any changes. °· Take over-the-counter and  prescription medicines only as told by your health care provider. Talk with your health care provider if you think that your dizziness is caused by a medicine that you are taking. °· Tell a friend or a family member that you are feeling dizzy. If he or she notices any changes in your behavior, have this person call your health care provider. °· Keep all follow-up visits as told by your health care provider. This is important. °Contact a health care provider if: °· Your dizziness does not go away. °· Your dizziness or light-headedness gets worse. °· You feel nauseous. °· You have reduced hearing. °· You have new symptoms. °· You are unsteady on your feet or you feel like the room is spinning. °Get help right away if: °· You vomit or have diarrhea and are unable to eat or drink anything. °· You have problems talking, walking, swallowing, or using your arms, hands, or legs. °· You feel generally weak. °· You are not thinking clearly or you have trouble forming sentences. It may take a friend or family member to notice this. °· You have chest pain, abdominal pain, shortness of breath, or sweating. °· Your vision changes. °· You have any bleeding. °· You have a severe headache. °· You have neck pain or a stiff neck. °· You have a fever. °These symptoms may represent a serious problem that is an emergency. Do not wait to see if the symptoms will go away. Get medical help   right away. Call your local emergency services (911 in the [place] Do not drive yourself to the hospital. °Summary °· Dizziness is a feeling of unsteadiness or light-headedness. This condition can be caused by a number of things, including medicines, dehydration, or illness. °· Anyone can become dizzy, but dizziness is more common in older adults. °· Drink enough fluid to keep your urine clear or pale yellow. Do not drink alcohol. °· Avoid making quick movements if you feel dizzy. Monitor your dizziness for any changes. °This information is not intended to  replace advice given to you by your health care provider. Make sure you discuss any questions you have with your health care provider. °Document Released: 06/03/2001 Document Revised: 01/10/2017 Document Reviewed: 01/10/2017 °Elsevier Interactive Patient Education © 2018 [employer] Inc. ° °

## 2018-01-27 NOTE — Progress Notes (Signed)
Patient: Karla Patton Female    DOB: [birth year]   67 y.o.   MRN: [id number] Visit Date: 01/27/2018  Today's Provider: Mar Daring, PA-C   Chief Complaint  Patient presents with  . Dizziness   Subjective:    HPI Patient here today C/O vertigo episodes. She describes these spells as more "off-balance". She reports they most often occur when she stands and walks she sways a lot and feels like she is going to fall down. She did previously have more vertigo type spells with spinning and tinnitus. She was seen by Dr. Tami Ribas and had hearing tested and reports it was normal. She is no longer having spinning spells or tinnitus. She also reports having more "shakiness" in her hands as well. She does have associated nausea during the episodes. She has numbness in the right foot from an injury where it was cut with a lawnmower many years ago. She is also diabetic.   She also has complaints of some pain and pressure on left side of chest with pain radiating down to the left elbow.  She reports she is having some of the chest pain today. Does have family history of CVD and she personally has history of TIA.  No current cardiologist.   She also complains of increased sinus pressure over the last couple of days. She is having a headache and increased post nasal drainage. She has started using Mucinex DM for these symptoms and reports mild relief.      No Known Allergies   Current Outpatient Medications:  .  ALPRAZolam (XANAX) 0.5 MG tablet, TAKE 1 TABLET BY MOUTH TWICE DAILY, Disp: 60 tablet, Rfl: 5 .  aspirin 81 MG tablet, Take 162 mg by mouth daily. , Disp: , Rfl:  .  B-D ULTRAFINE III SHORT PEN 31G X 8 MM MISC, USE AS DIRECTED FOUR TIMES DAILY, BEFORE MEALS AND AT BEDTIME, Disp: 100 each, Rfl: 3 .  CONTOUR NEXT TEST test strip, CHECK BLOOD SUGAR BEFORE MEALS AND AT BEDTIME, Disp: 400 each, Rfl: 3 .  gabapentin (NEURONTIN) 300 MG capsule, TAKE 1 CAPSULE(300 MG) BY MOUTH AT BEDTIME,  Disp: 90 capsule, Rfl: 1 .  Insulin Glargine (LANTUS SOLOSTAR) 100 UNIT/ML Solostar Pen, Inject 22 Units into the skin daily at 10 pm., Disp: 15 mL, Rfl: 5 .  levothyroxine (SYNTHROID, LEVOTHROID) 100 MCG tablet, TAKE 1 TABLET BY MOUTH DAILY BEFORE BREAKFAST, Disp: 90 tablet, Rfl: 1 .  lisinopril (PRINIVIL,ZESTRIL) 10 MG tablet, Take 1 tablet (10 mg total) by mouth daily., Disp: 90 tablet, Rfl: 1 .  NOVOLOG FLEXPEN 100 UNIT/ML FlexPen, INJECT SUBCUTANEOUSLY AS PER SLIDING SCALE BEFORE EACH MEAL, Disp: 15 mL, Rfl: 5 .  Continuous Blood Gluc Sensor (FREESTYLE LIBRE 14 DAY SENSOR) MISC, 1 each by Does not apply route 4 (four) times daily -  before meals and at bedtime. (Patient not taking: Reported on 01/27/2018), Disp: 1 each, Rfl: 0  Review of Systems  Constitutional: Positive for activity change and fatigue. Negative for fever.  HENT: Positive for congestion, postnasal drip and sinus pressure. Negative for ear pain, sore throat, tinnitus and voice change.   Eyes: Negative for visual disturbance.  Respiratory: Positive for cough. Negative for chest tightness, shortness of breath and wheezing.   Cardiovascular: Positive for chest pain and leg swelling. Negative for palpitations.  Gastrointestinal: Positive for nausea. Negative for abdominal pain.  Genitourinary: Negative.   Neurological: Positive for dizziness, weakness, light-headedness, numbness and headaches.  Social History   Tobacco Use  . Smoking status: Never Smoker  . Smokeless tobacco: Never Used  Substance Use Topics  . Alcohol use: Yes    Alcohol/week: 4.2 oz    Types: 7 Shots of liquor per week    Comment: 1 airplane bottle of Fireball Whiskey every night   Objective:   BP 130/70 (BP Location: Right Arm, Patient Position: Sitting, Cuff Size: Normal)   Pulse 66   Temp 98.4 F (36.9 C) (Oral)   Resp 16   Wt 184 lb 6.4 oz (83.6 kg)   SpO2 98%   BMI 31.65 kg/m  Vitals:   01/27/18 0917  BP: 130/70  Pulse: 66  Resp: 16    Temp: 98.4 F (36.9 C)  TempSrc: Oral  SpO2: 98%  Weight: 184 lb 6.4 oz (83.6 kg)     Physical Exam  Constitutional: She is oriented to person, place, and time. She appears well-developed and well-nourished. No distress.  Neck: Normal range of motion. Neck supple. No JVD present. No tracheal deviation present. No thyromegaly present.  Cardiovascular: Normal rate, regular rhythm and normal heart sounds. Exam reveals no gallop and no friction rub.  No murmur heard. Pulmonary/Chest: Effort normal and breath sounds normal. No respiratory distress. She has no wheezes. She has no rales.  Musculoskeletal: She exhibits edema (trace bilaterally).  Lymphadenopathy:    She has no cervical adenopathy.  Neurological: She is alert and oriented to person, place, and time. She has normal strength. No cranial nerve deficit or sensory deficit. Gait abnormal. Coordination normal.  Positive Romberg, Unable to do some of gait (tandem specifically) due to imbalance  Skin: She is not diaphoretic.  Psychiatric: She has a normal mood and affect. Her behavior is normal. Judgment and thought content normal.  Vitals reviewed.       Assessment & Plan:     1. Unsteady gait Will refer to Neuro for further evaluation of acute change in balance and increased tremor of hands bilaterally. I will check labs as below and f/u pending results. She is scheduled to return to see me next month for her T2DM f/u. - Ambulatory referral to Neurology - CBC w/Diff/Platelet - Basic Metabolic Panel (BMET) - TSH - B12  2. Chronic nonintractable headache, unspecified headache type See above medical treatment plan. - Ambulatory referral to Neurology - CBC w/Diff/Platelet - Basic Metabolic Panel (BMET) - TSH - B12  3. Fatigue, unspecified type See above medical treatment plan. - Ambulatory referral to Neurology - CBC w/Diff/Platelet - Basic Metabolic Panel (BMET) - TSH - B12  4. Other chest pain EKG today  unremarkable today. NSR rate of 66. - EKG 12-Lead       Mar Daring, PA-C  [facility]

## 2018-01-28 ENCOUNTER — Telehealth: Payer: Self-pay

## 2018-01-28 LAB — CBC WITH DIFFERENTIAL/PLATELET
Basophils Absolute: 0 10*3/uL (ref 0.0–0.2)
Basos: 1 %
EOS (ABSOLUTE): 0.1 10*3/uL (ref 0.0–0.4)
Eos: 2 %
Hematocrit: 34.8 % (ref 34.0–46.6)
Hemoglobin: 12.1 g/dL (ref 11.1–15.9)
Immature Grans (Abs): 0 10*3/uL (ref 0.0–0.1)
Immature Granulocytes: 0 %
Lymphocytes Absolute: 1.8 10*3/uL (ref 0.7–3.1)
Lymphs: 33 %
MCH: 28.9 pg (ref 26.6–33.0)
MCHC: 34.8 g/dL (ref 31.5–35.7)
MCV: 83 fL (ref 79–97)
Monocytes Absolute: 0.9 10*3/uL (ref 0.1–0.9)
Monocytes: 17 %
Neutrophils Absolute: 2.6 10*3/uL (ref 1.4–7.0)
Neutrophils: 47 %
Platelets: 386 10*3/uL — ABNORMAL HIGH (ref 150–379)
RBC: 4.18 x10E6/uL (ref 3.77–5.28)
RDW: 13.9 % (ref 12.3–15.4)
WBC: 5.6 10*3/uL (ref 3.4–10.8)

## 2018-01-28 LAB — BASIC METABOLIC PANEL
BUN/Creatinine Ratio: 14 (ref 12–28)
BUN: 10 mg/dL (ref 8–27)
CO2: 25 mmol/L (ref 20–29)
Calcium: 9.4 mg/dL (ref 8.7–10.3)
Chloride: 88 mmol/L — ABNORMAL LOW (ref 96–106)
Creatinine, Ser: 0.73 mg/dL (ref 0.57–1.00)
GFR calc Af Amer: 100 mL/min/{1.73_m2} (ref >59)
GFR calc non Af Amer: 87 mL/min/{1.73_m2} (ref >59)
Glucose: 152 mg/dL — ABNORMAL HIGH (ref 65–99)
Potassium: 5.1 mmol/L (ref 3.5–5.2)
Sodium: 129 mmol/L — ABNORMAL LOW (ref 134–144)

## 2018-01-28 LAB — TSH: TSH: 2.35 u[IU]/mL (ref 0.450–4.500)

## 2018-01-28 LAB — VITAMIN B12: Vitamin B-12: 474 pg/mL (ref 232–1245)

## 2018-01-28 NOTE — Telephone Encounter (Signed)
lmtcb

## 2018-01-28 NOTE — Telephone Encounter (Signed)
-----   Message from Mar Daring, [place] sent at 01/28/2018  1:20 PM EST ----- Sodium is borderline low so add more salt to foods for a short while. Thyroid normal. Kidney and liver normal. B12 normal. Blood count normal. Sugar at 152.

## 2018-01-28 NOTE — Telephone Encounter (Signed)
Patient advised as below.  

## 2018-02-01 DIAGNOSIS — G44219 Episodic tension-type headache, not intractable: Secondary | ICD-10-CM | POA: Diagnosis not present

## 2018-02-01 DIAGNOSIS — M9901 Segmental and somatic dysfunction of cervical region: Secondary | ICD-10-CM | POA: Diagnosis not present

## 2018-02-09 DIAGNOSIS — R42 Dizziness and giddiness: Secondary | ICD-10-CM | POA: Diagnosis not present

## 2018-02-09 DIAGNOSIS — R51 Headache: Secondary | ICD-10-CM | POA: Diagnosis not present

## 2018-02-10 DIAGNOSIS — R42 Dizziness and giddiness: Secondary | ICD-10-CM | POA: Insufficient documentation

## 2018-02-10 DIAGNOSIS — R519 Headache, unspecified: Secondary | ICD-10-CM | POA: Insufficient documentation

## 2018-02-10 DIAGNOSIS — R51 Headache: Secondary | ICD-10-CM

## 2018-02-15 ENCOUNTER — Other Ambulatory Visit: Payer: Self-pay | Admitting: Neurology

## 2018-02-15 DIAGNOSIS — I639 Cerebral infarction, unspecified: Secondary | ICD-10-CM

## 2018-02-19 DIAGNOSIS — I208 Other forms of angina pectoris: Secondary | ICD-10-CM | POA: Diagnosis not present

## 2018-02-19 DIAGNOSIS — R0789 Other chest pain: Secondary | ICD-10-CM | POA: Diagnosis not present

## 2018-02-19 DIAGNOSIS — E782 Mixed hyperlipidemia: Secondary | ICD-10-CM | POA: Insufficient documentation

## 2018-02-19 DIAGNOSIS — E113293 Type 2 diabetes mellitus with mild nonproliferative diabetic retinopathy without macular edema, bilateral: Secondary | ICD-10-CM | POA: Diagnosis not present

## 2018-02-23 ENCOUNTER — Ambulatory Visit
Admission: RE | Admit: 2018-02-23 | Discharge: 2018-02-23 | Disposition: A | Discharge status: Home / Self Care | Payer: Medicare Other | Source: Ambulatory Visit | Attending: Neurology | Admitting: Neurology

## 2018-02-23 DIAGNOSIS — I6782 Cerebral ischemia: Secondary | ICD-10-CM | POA: Diagnosis not present

## 2018-02-23 DIAGNOSIS — I639 Cerebral infarction, unspecified: Secondary | ICD-10-CM | POA: Diagnosis not present

## 2018-02-23 DIAGNOSIS — G319 Degenerative disease of nervous system, unspecified: Secondary | ICD-10-CM | POA: Insufficient documentation

## 2018-03-03 DIAGNOSIS — E1159 Type 2 diabetes mellitus with other circulatory complications: Secondary | ICD-10-CM | POA: Insufficient documentation

## 2018-03-03 DIAGNOSIS — I208 Other forms of angina pectoris: Secondary | ICD-10-CM | POA: Diagnosis not present

## 2018-03-03 DIAGNOSIS — E782 Mixed hyperlipidemia: Secondary | ICD-10-CM | POA: Diagnosis not present

## 2018-03-03 DIAGNOSIS — I10 Essential (primary) hypertension: Secondary | ICD-10-CM | POA: Insufficient documentation

## 2018-03-03 DIAGNOSIS — R072 Precordial pain: Secondary | ICD-10-CM | POA: Insufficient documentation

## 2018-03-03 DIAGNOSIS — E113293 Type 2 diabetes mellitus with mild nonproliferative diabetic retinopathy without macular edema, bilateral: Secondary | ICD-10-CM | POA: Diagnosis not present

## 2018-03-03 HISTORY — DX: Type 2 diabetes mellitus with other circulatory complications: E11.59

## 2018-03-08 ENCOUNTER — Ambulatory Visit: Payer: Medicare Other | Admitting: Physician Assistant

## 2018-03-09 ENCOUNTER — Other Ambulatory Visit: Payer: Self-pay | Admitting: Physician Assistant

## 2018-03-09 ENCOUNTER — Ambulatory Visit: Payer: Medicare Other | Admitting: Physician Assistant

## 2018-03-09 DIAGNOSIS — Z1231 Encounter for screening mammogram for malignant neoplasm of breast: Secondary | ICD-10-CM

## 2018-03-10 ENCOUNTER — Telehealth: Payer: Self-pay | Admitting: Physician Assistant

## 2018-03-10 ENCOUNTER — Encounter: Payer: Self-pay | Admitting: Physician Assistant

## 2018-03-10 ENCOUNTER — Ambulatory Visit (INDEPENDENT_AMBULATORY_CARE_PROVIDER_SITE_OTHER): Payer: Medicare Other | Admitting: Physician Assistant

## 2018-03-10 VITALS — BP 130/80 | HR 74 | Temp 97.7°F | Resp 16 | Ht 64.0 in | Wt 187.0 lb

## 2018-03-10 DIAGNOSIS — R42 Dizziness and giddiness: Secondary | ICD-10-CM | POA: Diagnosis not present

## 2018-03-10 DIAGNOSIS — R109 Unspecified abdominal pain: Secondary | ICD-10-CM

## 2018-03-10 DIAGNOSIS — Z1211 Encounter for screening for malignant neoplasm of colon: Secondary | ICD-10-CM | POA: Diagnosis not present

## 2018-03-10 DIAGNOSIS — E11319 Type 2 diabetes mellitus with unspecified diabetic retinopathy without macular edema: Secondary | ICD-10-CM

## 2018-03-10 DIAGNOSIS — E7800 Pure hypercholesterolemia, unspecified: Secondary | ICD-10-CM

## 2018-03-10 DIAGNOSIS — I639 Cerebral infarction, unspecified: Secondary | ICD-10-CM | POA: Diagnosis not present

## 2018-03-10 DIAGNOSIS — Z794 Long term (current) use of insulin: Secondary | ICD-10-CM

## 2018-03-10 DIAGNOSIS — R51 Headache: Secondary | ICD-10-CM | POA: Diagnosis not present

## 2018-03-10 DIAGNOSIS — K7689 Other specified diseases of liver: Secondary | ICD-10-CM | POA: Diagnosis not present

## 2018-03-10 LAB — POCT GLYCOSYLATED HEMOGLOBIN (HGB A1C)
Est. average glucose Bld gHb Est-mCnc: 197
Hemoglobin A1C: 8.5

## 2018-03-10 NOTE — Progress Notes (Addendum)
Patient: Karla Patton Female    DOB: 06-19-1951   67 y.o.   MRN: [id number] Visit Date: 03/10/2018  Today's Provider: Mar Daring, PA-C   Chief Complaint  Patient presents with  . Diabetes   Subjective:    HPI  Diabetes Mellitus Type II, Follow-up:   Lab Results  Component Value Date   HGBA1C 8.5 03/10/2018   HGBA1C 8.7 12/04/2017   HGBA1C 8.4 08/28/2017    Last seen for diabetes 3 months ago.  Management since then includes no changes. She reports excellent compliance with treatment. She is not having side effects.  Current symptoms include none and have been stable. Home blood sugar records: fasting range: 50's  Episodes of hypoglycemia? no   Current Insulin Regimen: as indicated Most Recent Eye Exam: UTD Weight trend: stable Prior visit with dietician: no Current diet: in general, a "healthy" diet   Current exercise: housecleaning Patient states she does not always use her novolog with meals as she should but does take her lantus nightly. She is trying to control dietary habits better.   Pertinent Labs:    Component Value Date/Time   CHOL 208 (H) 05/29/2017 1046   TRIG 53 05/29/2017 1046   HDL 66 05/29/2017 1046   LDLCALC 131 (H) 05/29/2017 1046   CREATININE 0.73 01/27/2018 1045    Wt Readings from Last 3 Encounters:  03/10/18 187 lb (84.8 kg)  01/27/18 184 lb 6.4 oz (83.6 kg)  12/04/17 187 lb (84.8 kg)   ------------------------------------------------------------------------    No Known Allergies   Current Outpatient Medications:  .  ALPRAZolam (XANAX) 0.5 MG tablet, TAKE 1 TABLET BY MOUTH TWICE DAILY, Disp: 60 tablet, Rfl: 5 .  aspirin 81 MG tablet, Take 162 mg by mouth daily. , Disp: , Rfl:  .  atorvastatin (LIPITOR) 10 MG tablet, Take 1 tablet by mouth at bedtime., Disp: , Rfl:  .  B-D ULTRAFINE III SHORT PEN 31G X 8 MM MISC, USE AS DIRECTED FOUR TIMES DAILY, BEFORE MEALS AND AT BEDTIME, Disp: 100 each, Rfl: 3 .  Continuous  Blood Gluc Sensor (FREESTYLE LIBRE 14 DAY SENSOR) MISC, 1 each by Does not apply route 4 (four) times daily -  before meals and at bedtime., Disp: 1 each, Rfl: 0 .  CONTOUR NEXT TEST test strip, CHECK BLOOD SUGAR BEFORE MEALS AND AT BEDTIME, Disp: 400 each, Rfl: 3 .  gabapentin (NEURONTIN) 300 MG capsule, TAKE 1 CAPSULE(300 MG) BY MOUTH AT BEDTIME, Disp: 90 capsule, Rfl: 1 .  Insulin Glargine (LANTUS SOLOSTAR) 100 UNIT/ML Solostar Pen, Inject 22 Units into the skin daily at 10 pm., Disp: 15 mL, Rfl: 5 .  levothyroxine (SYNTHROID, LEVOTHROID) 100 MCG tablet, TAKE 1 TABLET BY MOUTH DAILY BEFORE BREAKFAST, Disp: 90 tablet, Rfl: 1 .  NOVOLOG FLEXPEN 100 UNIT/ML FlexPen, INJECT SUBCUTANEOUSLY AS PER SLIDING SCALE BEFORE EACH MEAL, Disp: 15 mL, Rfl: 5  Review of Systems  Constitutional: Negative.   Respiratory: Negative.   Cardiovascular: Negative.   Gastrointestinal: Negative.   Endocrine: Negative.   Neurological: Negative.     Social History   Tobacco Use  . Smoking status: Never Smoker  . Smokeless tobacco: Never Used  Substance Use Topics  . Alcohol use: Yes    Alcohol/week: 4.2 oz    Types: 7 Shots of liquor per week    Comment: 1 airplane bottle of Fireball Whiskey every night   Objective:   BP 130/80 (BP Location: Left Arm, Patient Position:  Sitting, Cuff Size: Normal)   Pulse 74   Temp 97.7 F (36.5 C) (Oral)   Resp 16   Ht 5\' 4"  (1.626 m)   Wt 187 lb (84.8 kg)   SpO2 98%   BMI 32.10 kg/m  Vitals:   03/10/18 0837  BP: 130/80  Pulse: 74  Resp: 16  Temp: 97.7 F (36.5 C)  TempSrc: Oral  SpO2: 98%  Weight: 187 lb (84.8 kg)  Height: 5\' 4"  (1.626 m)    Physical Exam  Constitutional: She appears well-developed and well-nourished. No distress.  Neck: Normal range of motion. Neck supple. No JVD present. No tracheal deviation present. No thyromegaly present.  Cardiovascular: Normal rate, regular rhythm and normal heart sounds. Exam reveals no gallop and no friction  rub.  No murmur heard. Pulmonary/Chest: Effort normal and breath sounds normal. No respiratory distress. She has no wheezes. She has no rales.  Abdominal: Soft. Bowel sounds are normal. She exhibits no distension and no mass. There is no tenderness. There is no rebound and no guarding.  Musculoskeletal:       Arms: Lymphadenopathy:    She has no cervical adenopathy.  Skin: She is not diaphoretic.  Vitals reviewed.      Assessment & Plan:     1. Type 2 diabetes mellitus with retinopathy of both eyes, with long-term current use of insulin, macular edema presence unspecified, unspecified retinopathy severity ([facility]) A1c slight improvement to 8.5 from 8.7. Continue lifestyle changes and try to be more consistent with novolog dosing. Continue Lantus 22 units nightly. I will see her back in 3 months for AWV/CPE.  - POCT glycosylated hemoglobin (Hb A1C)  2. Colon cancer screening Contacted Dr. Bary Castilla office and colonoscopy was last done 02/2014 and due for repeat in 5 years, 02/2019.  3. Hypercholesterolemia Recently started on atorvastatin 10mg  by Dr. Nehemiah Massed. Checking labs and will recheck in 3 months at CPE to see how improving on atorvastatin.  - Lipid Profile  4. Right flank pain Worsening over last 2-3 weeks. States she has history of hepatic cyst that used to be followed by Dr. Burt Ek, previous PCP, regularly and wants to have this rechecked. CT ordered as below.  - CT Abdomen Pelvis W Contrast; Future  5. Hepatic cyst See above medical treatment plan. - CT Abdomen Pelvis W Contrast; Future       Mar Daring, PA-C  [facility]

## 2018-03-10 NOTE — Telephone Encounter (Signed)
Janett Billow called back to let us know that Ms. Haser is on a call back list to get a reminder on 03/20/19 that she is due for a 5 year followup colonoscopy.

## 2018-03-10 NOTE — Patient Instructions (Signed)
Diabetes Mellitus and Nutrition When you have diabetes (diabetes mellitus), it is very important to have healthy eating habits because your blood sugar (glucose) levels are greatly affected by what you eat and drink. Eating healthy foods in the appropriate amounts, at about the same times every day, can help you:  Control your blood glucose.  Lower your risk of heart disease.  Improve your blood pressure.  Reach or maintain a healthy weight.  Every person with diabetes is different, and each person has different needs for a meal plan. Your health care provider may recommend that you work with a diet and nutrition specialist (dietitian) to make a meal plan that is best for you. Your meal plan may vary depending on factors such as:  The calories you need.  The medicines you take.  Your weight.  Your blood glucose, blood pressure, and cholesterol levels.  Your activity level.  Other health conditions you have, such as heart or kidney disease.  How do carbohydrates affect me? Carbohydrates affect your blood glucose level more than any other type of food. Eating carbohydrates naturally increases the amount of glucose in your blood. Carbohydrate counting is a method for keeping track of how many carbohydrates you eat. Counting carbohydrates is important to keep your blood glucose at a healthy level, especially if you use insulin or take certain oral diabetes medicines. It is important to know how many carbohydrates you can safely have in each meal. This is different for every person. Your dietitian can help you calculate how many carbohydrates you should have at each meal and for snack. Foods that contain carbohydrates include:  Bread, cereal, rice, pasta, and crackers.  Potatoes and corn.  Peas, beans, and lentils.  Milk and yogurt.  Fruit and juice.  Desserts, such as cakes, cookies, ice cream, and candy.  How does alcohol affect me? Alcohol can cause a sudden decrease in blood  glucose (hypoglycemia), especially if you use insulin or take certain oral diabetes medicines. Hypoglycemia can be a life-threatening condition. Symptoms of hypoglycemia (sleepiness, dizziness, and confusion) are similar to symptoms of having too much alcohol. If your health care provider says that alcohol is safe for you, follow these guidelines:  Limit alcohol intake to no more than 1 drink per day for nonpregnant women and 2 drinks per day for men. One drink equals 12 oz of beer, 5 oz of wine, or 1 oz of hard liquor.  Do not drink on an empty stomach.  Keep yourself hydrated with water, diet soda, or unsweetened iced tea.  Keep in mind that regular soda, juice, and other mixers may contain a lot of sugar and must be counted as carbohydrates.  What are tips for following this plan? Reading food labels  Start by checking the serving size on the label. The amount of calories, carbohydrates, fats, and other nutrients listed on the label are based on one serving of the food. Many foods contain more than one serving per package.  Check the total grams (g) of carbohydrates in one serving. You can calculate the number of servings of carbohydrates in one serving by dividing the total carbohydrates by 15. For example, if a food has 30 g of total carbohydrates, it would be equal to 2 servings of carbohydrates.  Check the number of grams (g) of saturated and trans fats in one serving. Choose foods that have low or no amount of these fats.  Check the number of milligrams (mg) of sodium in one serving. Most people   should limit total sodium intake to less than 2,300 mg per day.  Always check the nutrition information of foods labeled as "low-fat" or "nonfat". These foods may be higher in added sugar or refined carbohydrates and should be avoided.  Talk to your dietitian to identify your daily goals for nutrients listed on the label. Shopping  Avoid buying canned, premade, or processed foods. These  foods tend to be high in fat, sodium, and added sugar.  Shop around the outside edge of the grocery store. This includes fresh fruits and vegetables, bulk grains, fresh meats, and fresh dairy. Cooking  Use low-heat cooking methods, such as baking, instead of high-heat cooking methods like deep frying.  Cook using healthy oils, such as olive, canola, or sunflower oil.  Avoid cooking with butter, cream, or high-fat meats. Meal planning  Eat meals and snacks regularly, preferably at the same times every day. Avoid going long periods of time without eating.  Eat foods high in fiber, such as fresh fruits, vegetables, beans, and whole grains. Talk to your dietitian about how many servings of carbohydrates you can eat at each meal.  Eat 4-6 ounces of lean protein each day, such as lean meat, chicken, fish, eggs, or tofu. 1 ounce is equal to 1 ounce of meat, chicken, or fish, 1 egg, or 1/4 cup of tofu.  Eat some foods each day that contain healthy fats, such as avocado, nuts, seeds, and fish. Lifestyle   Check your blood glucose regularly.  Exercise at least 30 minutes 5 or more days each week, or as told by your health care provider.  Take medicines as told by your health care provider.  Do not use any products that contain nicotine or tobacco, such as cigarettes and e-cigarettes. If you need help quitting, ask your health care provider.  Work with a counselor or diabetes educator to identify strategies to manage stress and any emotional and social challenges. What are some questions to ask my health care provider?  Do I need to meet with a diabetes educator?  Do I need to meet with a dietitian?  What number can I call if I have questions?  When are the best times to check my blood glucose? Where to find more information:  American Diabetes Association: diabetes.org/food-and-fitness/food  Academy of Nutrition and Dietetics:  www.eatright.org/resources/health/diseases-and-conditions/diabetes  National Institute of Diabetes and Digestive and Kidney Diseases (NIH): www.niddk.nih.gov/health-information/diabetes/overview/diet-eating-physical-activity Summary  A healthy meal plan will help you control your blood glucose and maintain a healthy lifestyle.  Working with a diet and nutrition specialist (dietitian) can help you make a meal plan that is best for you.  Keep in mind that carbohydrates and alcohol have immediate effects on your blood glucose levels. It is important to count carbohydrates and to use alcohol carefully. This information is not intended to replace advice given to you by your health care provider. Make sure you discuss any questions you have with your health care provider. Document Released: 09/04/2005 Document Revised: 01/12/2017 Document Reviewed: 01/12/2017 Elsevier Interactive Patient Education  2018 Elsevier Inc.  

## 2018-03-10 NOTE — Telephone Encounter (Signed)
Colonoscopy report requested from [facility] [place]

## 2018-03-10 NOTE — Telephone Encounter (Signed)
Can we call Dr. Dwyane Luo office downstairs to see if we can get a copy of her colonoscopy results?  Thanks.

## 2018-03-11 ENCOUNTER — Telehealth: Payer: Self-pay

## 2018-03-11 ENCOUNTER — Telehealth: Payer: Self-pay | Admitting: Physician Assistant

## 2018-03-11 DIAGNOSIS — E1129 Type 2 diabetes mellitus with other diabetic kidney complication: Secondary | ICD-10-CM

## 2018-03-11 DIAGNOSIS — R809 Proteinuria, unspecified: Secondary | ICD-10-CM

## 2018-03-11 DIAGNOSIS — Z794 Long term (current) use of insulin: Principal | ICD-10-CM

## 2018-03-11 LAB — LIPID PANEL
Chol/HDL Ratio: 2.7 ratio (ref 0.0–4.4)
Cholesterol, Total: 184 mg/dL (ref 100–199)
HDL: 68 mg/dL (ref >39)
LDL Calculated: 103 mg/dL — ABNORMAL HIGH (ref 0–99)
Triglycerides: 66 mg/dL (ref 0–149)
VLDL Cholesterol Cal: 13 mg/dL (ref 5–40)

## 2018-03-11 NOTE — Telephone Encounter (Addendum)
Patient reports she does not remember having the colonoscopy done on 02/2014.

## 2018-03-11 NOTE — Addendum Note (Signed)
Addended by: Mar Daring on: 03/11/2018 08:21 AM   Modules accepted: Orders

## 2018-03-11 NOTE — Telephone Encounter (Signed)
Patient advised as below. Patient verbalizes understanding and is in agreement with treatment plan.  

## 2018-03-11 NOTE — Telephone Encounter (Signed)
-----   Message from Mar Daring, PA-C sent at 03/11/2018  8:31 AM EDT ----- Cholesterol already improving compared to previously! Continue atorvastatin.

## 2018-03-11 NOTE — Telephone Encounter (Signed)
Per Dr. Curly Shores office patient is due for a colonoscopy in 09/2018. Patient is on a call back list. [place] office confirmed that patient did not have colonoscopy done in 02/2014.sd

## 2018-03-11 NOTE — Telephone Encounter (Signed)
Signed creatinine ordered

## 2018-03-11 NOTE — Telephone Encounter (Signed)
Patient needs order for Creatinine before she can have CT done.

## 2018-03-11 NOTE — Telephone Encounter (Signed)
Can we notify patient last colonoscopy was done in 02/2014  and she had polyps so she cannot have cologuard done. Dr. Bary Castilla had wanted to repeat her colonoscopy in 5 years so she is due for her repeat colonoscopy in 02/2019.

## 2018-03-12 NOTE — Telephone Encounter (Signed)
Patient reports she will not be able to have lab done before CT scan. Patient reports she is in [place] and will not be back till Monday night. Please reschedule. sd

## 2018-03-15 ENCOUNTER — Ambulatory Visit: Payer: Medicare Other

## 2018-03-16 ENCOUNTER — Other Ambulatory Visit
Admission: RE | Admit: 2018-03-16 | Discharge: 2018-03-16 | Disposition: A | Discharge status: Home / Self Care | Payer: Medicare Other | Source: Ambulatory Visit | Attending: Physician Assistant | Admitting: Physician Assistant

## 2018-03-16 ENCOUNTER — Ambulatory Visit
Admission: RE | Admit: 2018-03-16 | Discharge: 2018-03-16 | Disposition: A | Discharge status: Home / Self Care | Payer: Medicare Other | Source: Ambulatory Visit | Attending: Physician Assistant | Admitting: Physician Assistant

## 2018-03-16 ENCOUNTER — Telehealth: Payer: Self-pay | Admitting: Physician Assistant

## 2018-03-16 DIAGNOSIS — K7689 Other specified diseases of liver: Secondary | ICD-10-CM | POA: Diagnosis present

## 2018-03-16 DIAGNOSIS — R109 Unspecified abdominal pain: Secondary | ICD-10-CM | POA: Diagnosis present

## 2018-03-16 DIAGNOSIS — K769 Liver disease, unspecified: Secondary | ICD-10-CM | POA: Insufficient documentation

## 2018-03-16 DIAGNOSIS — I700 Atherosclerosis of aorta: Secondary | ICD-10-CM | POA: Diagnosis not present

## 2018-03-16 LAB — CREATININE, SERUM
Creatinine, Ser: 0.72 mg/dL (ref 0.44–1.00)
GFR calc Af Amer: >60 mL/min (ref >60)
GFR calc non Af Amer: >60 mL/min (ref >60)

## 2018-03-16 MED ORDER — IOPAMIDOL (ISOVUE-300) INJECTION 61%
100.0000 mL | Freq: Once | INTRAVENOUS | Status: AC | PRN
  Administered 2018-03-16: 100 mL via INTRAVENOUS

## 2018-03-16 NOTE — Telephone Encounter (Signed)
Message left on voicemail pt will just need to arrive 30 mins prior to appointment to do labwork

## 2018-03-17 NOTE — Telephone Encounter (Signed)
lmtcb

## 2018-03-17 NOTE — Telephone Encounter (Signed)
-----   Message from Mar Daring, PA-C sent at 03/17/2018  8:33 AM EDT ----- Liver mass is stable and unchanged, consistent with a hemangioma. There is some calcifications noted in the aorta but being on aspirin and atorvastatin and controlling diabetes and blood pressure is best medical management for this. It is also noted you have severe degenerative disc disease and arthritis noted in your lumbar spine at L4-L5 region.

## 2018-03-19 NOTE — Telephone Encounter (Signed)
Patient saw results on mychart  

## 2018-03-19 NOTE — Telephone Encounter (Signed)
Viewed by Fabio Asa on 03/17/2018 3:46 PM

## 2018-03-19 NOTE — Telephone Encounter (Signed)
Patient

## 2018-03-26 ENCOUNTER — Encounter: Payer: Self-pay | Admitting: Physician Assistant

## 2018-03-28 ENCOUNTER — Encounter: Payer: Self-pay | Admitting: Physician Assistant

## 2018-04-01 ENCOUNTER — Telehealth: Payer: Self-pay

## 2018-04-01 ENCOUNTER — Ambulatory Visit
Admission: RE | Admit: 2018-04-01 | Discharge: 2018-04-01 | Disposition: A | Discharge status: Home / Self Care | Payer: Medicare Other | Source: Ambulatory Visit | Attending: Physician Assistant | Admitting: Physician Assistant

## 2018-04-01 DIAGNOSIS — E113293 Type 2 diabetes mellitus with mild nonproliferative diabetic retinopathy without macular edema, bilateral: Secondary | ICD-10-CM | POA: Diagnosis not present

## 2018-04-01 DIAGNOSIS — Z1231 Encounter for screening mammogram for malignant neoplasm of breast: Secondary | ICD-10-CM | POA: Diagnosis not present

## 2018-04-01 DIAGNOSIS — E782 Mixed hyperlipidemia: Secondary | ICD-10-CM | POA: Diagnosis not present

## 2018-04-01 DIAGNOSIS — I10 Essential (primary) hypertension: Secondary | ICD-10-CM | POA: Diagnosis not present

## 2018-04-01 NOTE — Telephone Encounter (Signed)
Viewed by Fabio Asa on 04/01/2018 1:56 PM

## 2018-04-01 NOTE — Telephone Encounter (Signed)
-----   Message from Mar Daring, [place] sent at 04/01/2018  1:14 PM EDT ----- Normal mammogram. Repeat screening in one year.

## 2018-04-07 ENCOUNTER — Other Ambulatory Visit: Payer: Self-pay | Admitting: Physician Assistant

## 2018-04-07 DIAGNOSIS — F411 Generalized anxiety disorder: Secondary | ICD-10-CM

## 2018-04-07 MED ORDER — ALPRAZOLAM 0.5 MG PO TABS: 0.5000 mg | ORAL_TABLET | Freq: Two times a day (BID) | ORAL | 5 refills | Status: DC

## 2018-04-07 NOTE — Telephone Encounter (Signed)
[facility] pharmacy faxed a refill request for the following medication. Thanks CC  ALPRAZolam (XANAX) 0.5 MG tablet

## 2018-04-08 ENCOUNTER — Other Ambulatory Visit: Payer: Self-pay | Admitting: Physician Assistant

## 2018-04-08 DIAGNOSIS — Z794 Long term (current) use of insulin: Principal | ICD-10-CM

## 2018-04-08 DIAGNOSIS — E039 Hypothyroidism, unspecified: Secondary | ICD-10-CM

## 2018-04-08 DIAGNOSIS — E1140 Type 2 diabetes mellitus with diabetic neuropathy, unspecified: Secondary | ICD-10-CM

## 2018-04-08 DIAGNOSIS — E11319 Type 2 diabetes mellitus with unspecified diabetic retinopathy without macular edema: Secondary | ICD-10-CM

## 2018-04-08 DIAGNOSIS — E119 Type 2 diabetes mellitus without complications: Secondary | ICD-10-CM

## 2018-04-08 MED ORDER — LEVOTHYROXINE SODIUM 100 MCG PO TABS: ORAL_TABLET | ORAL | 1 refills | Status: DC

## 2018-04-08 MED ORDER — ATORVASTATIN CALCIUM 10 MG PO TABS: 10.0000 mg | ORAL_TABLET | Freq: Every day | ORAL | 1 refills | Status: DC

## 2018-04-08 MED ORDER — INSULIN GLARGINE 100 UNIT/ML SOLOSTAR PEN: 22.0000 [IU] | PEN_INJECTOR | Freq: Every day | SUBCUTANEOUS | 5 refills | Status: DC

## 2018-04-08 MED ORDER — INSULIN ASPART 100 UNIT/ML FLEXPEN: PEN_INJECTOR | SUBCUTANEOUS | 5 refills | Status: DC

## 2018-04-08 MED ORDER — GABAPENTIN 300 MG PO CAPS: ORAL_CAPSULE | ORAL | 1 refills | Status: DC

## 2018-04-08 MED ORDER — INSULIN PEN NEEDLE 31G X 8 MM MISC: 3 refills | Status: DC

## 2018-04-08 NOTE — Telephone Encounter (Signed)
Please review. Thanks!  

## 2018-04-08 NOTE — Telephone Encounter (Signed)
[facility] pharmacy faxed a refill request for the following medications. Thanks CC  B-D ULTRAFINE III SHORT PEN 31G X 8 MM MISC   gabapentin (NEURONTIN) 300 MG capsule   Insulin Glargine (LANTUS SOLOSTAR) 100 UNIT/ML Solostar Pen   levothyroxine (SYNTHROID, LEVOTHROID) 100 MCG tablet   NOVOLOG FLEXPEN 100 UNIT/ML FlexPen   atorvastatin (LIPITOR) 10 MG tablet

## 2018-04-12 ENCOUNTER — Other Ambulatory Visit: Payer: Self-pay | Admitting: Physician Assistant

## 2018-04-12 NOTE — Telephone Encounter (Signed)
Medication was refilled on 04/07/2018.

## 2018-04-12 NOTE — Telephone Encounter (Signed)
[facility] pharmacy faxed a refill request for the following medication. Thanks CC  ALPRAZolam (XANAX) 0.5 MG tablet

## 2018-04-15 ENCOUNTER — Other Ambulatory Visit: Payer: Self-pay | Admitting: Physician Assistant

## 2018-04-15 DIAGNOSIS — F411 Generalized anxiety disorder: Secondary | ICD-10-CM

## 2018-04-15 DIAGNOSIS — E119 Type 2 diabetes mellitus without complications: Secondary | ICD-10-CM

## 2018-04-15 MED ORDER — INSULIN ASPART 100 UNIT/ML FLEXPEN: PEN_INJECTOR | SUBCUTANEOUS | 5 refills | Status: DC

## 2018-04-15 MED ORDER — ALPRAZOLAM 0.5 MG PO TABS: 0.5000 mg | ORAL_TABLET | Freq: Two times a day (BID) | ORAL | 1 refills | Status: DC

## 2018-04-15 NOTE — Progress Notes (Signed)
Can we call patient and see what dose she does for her Novolog with meals. This was set by endocrine previously so I am not 100% sure of dose.

## 2018-04-15 NOTE — Progress Notes (Signed)
Patient reports that she does a sliding scale for Novolog. She reports that every 15g of carbs she goes up 1 unit (?).

## 2018-04-15 NOTE — Telephone Encounter (Signed)
[facility] pharmacy faxed a refill request for the following medication. Thanks CC  ALPRAZolam (XANAX) 0.5 MG tablet

## 2018-04-15 NOTE — Telephone Encounter (Signed)
Please review. Thanks!  

## 2018-04-27 DIAGNOSIS — G44219 Episodic tension-type headache, not intractable: Secondary | ICD-10-CM | POA: Diagnosis not present

## 2018-04-27 DIAGNOSIS — M9901 Segmental and somatic dysfunction of cervical region: Secondary | ICD-10-CM | POA: Diagnosis not present

## 2018-05-04 ENCOUNTER — Other Ambulatory Visit: Payer: Self-pay

## 2018-05-04 DIAGNOSIS — E11319 Type 2 diabetes mellitus with unspecified diabetic retinopathy without macular edema: Secondary | ICD-10-CM

## 2018-05-04 DIAGNOSIS — E1140 Type 2 diabetes mellitus with diabetic neuropathy, unspecified: Secondary | ICD-10-CM

## 2018-05-04 DIAGNOSIS — Z794 Long term (current) use of insulin: Principal | ICD-10-CM

## 2018-05-05 MED ORDER — INSULIN GLARGINE 100 UNIT/ML SOLOSTAR PEN
22.0000 [IU] | PEN_INJECTOR | Freq: Every day | SUBCUTANEOUS | 5 refills | Status: DC
Start: 2018-05-05 — End: 2018-10-27

## 2018-05-05 MED ORDER — LISINOPRIL 10 MG PO TABS: 10.0000 mg | ORAL_TABLET | Freq: Every day | ORAL | 3 refills | Status: DC

## 2018-05-19 ENCOUNTER — Other Ambulatory Visit: Payer: Self-pay | Admitting: Physician Assistant

## 2018-05-19 DIAGNOSIS — Z794 Long term (current) use of insulin: Principal | ICD-10-CM

## 2018-05-19 DIAGNOSIS — E1140 Type 2 diabetes mellitus with diabetic neuropathy, unspecified: Secondary | ICD-10-CM

## 2018-05-31 ENCOUNTER — Ambulatory Visit (INDEPENDENT_AMBULATORY_CARE_PROVIDER_SITE_OTHER): Payer: Medicare Other | Admitting: Physician Assistant

## 2018-05-31 ENCOUNTER — Encounter: Payer: Self-pay | Admitting: Physician Assistant

## 2018-05-31 VITALS — BP 140/80 | HR 69 | Temp 98.0°F | Resp 16 | Ht 64.0 in | Wt 189.6 lb

## 2018-05-31 DIAGNOSIS — I10 Essential (primary) hypertension: Secondary | ICD-10-CM

## 2018-05-31 DIAGNOSIS — E11649 Type 2 diabetes mellitus with hypoglycemia without coma: Secondary | ICD-10-CM

## 2018-05-31 DIAGNOSIS — Z794 Long term (current) use of insulin: Secondary | ICD-10-CM

## 2018-05-31 DIAGNOSIS — E1140 Type 2 diabetes mellitus with diabetic neuropathy, unspecified: Secondary | ICD-10-CM

## 2018-05-31 DIAGNOSIS — E1165 Type 2 diabetes mellitus with hyperglycemia: Secondary | ICD-10-CM | POA: Diagnosis not present

## 2018-05-31 DIAGNOSIS — IMO0002 Reserved for concepts with insufficient information to code with codable children: Secondary | ICD-10-CM

## 2018-05-31 DIAGNOSIS — E113299 Type 2 diabetes mellitus with mild nonproliferative diabetic retinopathy without macular edema, unspecified eye: Secondary | ICD-10-CM | POA: Diagnosis not present

## 2018-05-31 DIAGNOSIS — E11319 Type 2 diabetes mellitus with unspecified diabetic retinopathy without macular edema: Secondary | ICD-10-CM

## 2018-05-31 DIAGNOSIS — E039 Hypothyroidism, unspecified: Secondary | ICD-10-CM | POA: Diagnosis not present

## 2018-05-31 DIAGNOSIS — E1121 Type 2 diabetes mellitus with diabetic nephropathy: Secondary | ICD-10-CM

## 2018-05-31 DIAGNOSIS — Z0000 Encounter for general adult medical examination without abnormal findings: Secondary | ICD-10-CM

## 2018-05-31 DIAGNOSIS — Z6832 Body mass index (BMI) 32.0-32.9, adult: Secondary | ICD-10-CM

## 2018-05-31 DIAGNOSIS — E7800 Pure hypercholesterolemia, unspecified: Secondary | ICD-10-CM

## 2018-05-31 NOTE — Patient Instructions (Signed)
Health Maintenance for Postmenopausal Women Menopause is a normal process in which your reproductive ability comes to an end. This process happens gradually over a span of months to years, usually between the ages of 22 and 9. Menopause is complete when you have missed 12 consecutive menstrual periods. It is important to talk with your health care provider about some of the most common conditions that affect postmenopausal women, such as heart disease, cancer, and bone loss (osteoporosis). Adopting a healthy lifestyle and getting preventive care can help to promote your health and wellness. Those actions can also lower your chances of developing some of these common conditions. What should I know about menopause? During menopause, you may experience a number of symptoms, such as:  Moderate-to-severe hot flashes.  Night sweats.  Decrease in sex drive.  Mood swings.  Headaches.  Tiredness.  Irritability.  Memory problems.  Insomnia.  Choosing to treat or not to treat menopausal changes is an individual decision that you make with your health care provider. What should I know about hormone replacement therapy and supplements? Hormone therapy products are effective for treating symptoms that are associated with menopause, such as hot flashes and night sweats. Hormone replacement carries certain risks, especially as you become older. If you are thinking about using estrogen or estrogen with progestin treatments, discuss the benefits and risks with your health care provider. What should I know about heart disease and stroke? Heart disease, heart attack, and stroke become more likely as you age. This may be due, in part, to the hormonal changes that your body experiences during menopause. These can affect how your body processes dietary fats, triglycerides, and cholesterol. Heart attack and stroke are both medical emergencies. There are many things that you can do to help prevent heart disease  and stroke:  Have your blood pressure checked at least every 1-2 years. High blood pressure causes heart disease and increases the risk of stroke.  If you are 53-22 years old, ask your health care provider if you should take aspirin to prevent a heart attack or a stroke.  Do not use any tobacco products, including cigarettes, chewing tobacco, or electronic cigarettes. If you need help quitting, ask your health care provider.  It is important to eat a healthy diet and maintain a healthy weight. ? Be sure to include plenty of vegetables, fruits, low-fat dairy products, and lean protein. ? Avoid eating foods that are high in solid fats, added sugars, or salt (sodium).  Get regular exercise. This is one of the most important things that you can do for your health. ? Try to exercise for at least 150 minutes each week. The type of exercise that you do should increase your heart rate and make you sweat. This is known as moderate-intensity exercise. ? Try to do strengthening exercises at least twice each week. Do these in addition to the moderate-intensity exercise.  Know your numbers.Ask your health care provider to check your cholesterol and your blood glucose. Continue to have your blood tested as directed by your health care provider.  What should I know about cancer screening? There are several types of cancer. Take the following steps to reduce your risk and to catch any cancer development as early as possible. Breast Cancer  Practice breast self-awareness. ? This means understanding how your breasts normally appear and feel. ? It also means doing regular breast self-exams. Let your health care provider know about any changes, no matter how small.  If you are 40  or older, have a clinician do a breast exam (clinical breast exam or CBE) every year. Depending on your age, family history, and medical history, it may be recommended that you also have a yearly breast X-ray (mammogram).  If you  have a family history of breast cancer, talk with your health care provider about genetic screening.  If you are at high risk for breast cancer, talk with your health care provider about having an MRI and a mammogram every year.  Breast cancer (BRCA) gene test is recommended for women who have family members with BRCA-related cancers. Results of the assessment will determine the need for genetic counseling and BRCA1 and for BRCA2 testing. BRCA-related cancers include these types: ? Breast. This occurs in males or females. ? Ovarian. ? Tubal. This may also be called fallopian tube cancer. ? Cancer of the abdominal or pelvic lining (peritoneal cancer). ? Prostate. ? Pancreatic.  Cervical, Uterine, and Ovarian Cancer Your health care provider may recommend that you be screened regularly for cancer of the pelvic organs. These include your ovaries, uterus, and vagina. This screening involves a pelvic exam, which includes checking for microscopic changes to the surface of your cervix (Pap test).  For women ages 21-65, health care providers may recommend a pelvic exam and a Pap test every three years. For women ages 79-65, they may recommend the Pap test and pelvic exam, combined with testing for human papilloma virus (HPV), every five years. Some types of HPV increase your risk of cervical cancer. Testing for HPV may also be done on women of any age who have unclear Pap test results.  Other health care providers may not recommend any screening for nonpregnant women who are considered low risk for pelvic cancer and have no symptoms. Ask your health care provider if a screening pelvic exam is right for you.  If you have had past treatment for cervical cancer or a condition that could lead to cancer, you need Pap tests and screening for cancer for at least 20 years after your treatment. If Pap tests have been discontinued for you, your risk factors (such as having a new sexual partner) need to be  reassessed to determine if you should start having screenings again. Some women have medical problems that increase the chance of getting cervical cancer. In these cases, your health care provider may recommend that you have screening and Pap tests more often.  If you have a family history of uterine cancer or ovarian cancer, talk with your health care provider about genetic screening.  If you have vaginal bleeding after reaching menopause, tell your health care provider.  There are currently no reliable tests available to screen for ovarian cancer.  Lung Cancer Lung cancer screening is recommended for adults 69-62 years old who are at high risk for lung cancer because of a history of smoking. A yearly low-dose CT scan of the lungs is recommended if you:  Currently smoke.  Have a history of at least 30 pack-years of smoking and you currently smoke or have quit within the past 15 years. A pack-year is smoking an average of one pack of cigarettes per day for one year.  Yearly screening should:  Continue until it has been 15 years since you quit.  Stop if you develop a health problem that would prevent you from having lung cancer treatment.  Colorectal Cancer  This type of cancer can be detected and can often be prevented.  Routine colorectal cancer screening usually begins at  age 42 and continues through age 45.  If you have risk factors for colon cancer, your health care provider may recommend that you be screened at an earlier age.  If you have a family history of colorectal cancer, talk with your health care provider about genetic screening.  Your health care provider may also recommend using home test kits to check for hidden blood in your stool.  A small camera at the end of a tube can be used to examine your colon directly (sigmoidoscopy or colonoscopy). This is done to check for the earliest forms of colorectal cancer.  Direct examination of the colon should be repeated every  5-10 years until age 71. However, if early forms of precancerous polyps or small growths are found or if you have a family history or genetic risk for colorectal cancer, you may need to be screened more often.  Skin Cancer  Check your skin from head to toe regularly.  Monitor any moles. Be sure to tell your health care provider: ? About any new moles or changes in moles, especially if there is a change in a mole's shape or color. ? If you have a mole that is larger than the size of a pencil eraser.  If any of your family members has a history of skin cancer, especially at a young age, talk with your health care provider about genetic screening.  Always use sunscreen. Apply sunscreen liberally and repeatedly throughout the day.  Whenever you are outside, protect yourself by wearing long sleeves, pants, a wide-brimmed hat, and sunglasses.  What should I know about osteoporosis? Osteoporosis is a condition in which bone destruction happens more quickly than new bone creation. After menopause, you may be at an increased risk for osteoporosis. To help prevent osteoporosis or the bone fractures that can happen because of osteoporosis, the following is recommended:  If you are 46-71 years old, get at least 1,000 mg of calcium and at least 600 mg of vitamin D per day.  If you are older than age 55 but younger than age 65, get at least 1,200 mg of calcium and at least 600 mg of vitamin D per day.  If you are older than age 54, get at least 1,200 mg of calcium and at least 800 mg of vitamin D per day.  Smoking and excessive alcohol intake increase the risk of osteoporosis. Eat foods that are rich in calcium and vitamin D, and do weight-bearing exercises several times each week as directed by your health care provider. What should I know about how menopause affects my mental health? Depression may occur at any age, but it is more common as you become older. Common symptoms of depression  include:  Low or sad mood.  Changes in sleep patterns.  Changes in appetite or eating patterns.  Feeling an overall lack of motivation or enjoyment of activities that you previously enjoyed.  Frequent crying spells.  Talk with your health care provider if you think that you are experiencing depression. What should I know about immunizations? It is important that you get and maintain your immunizations. These include:  Tetanus, diphtheria, and pertussis (Tdap) booster vaccine.  Influenza every year before the flu season begins.  Pneumonia vaccine.  Shingles vaccine.  Your health care provider may also recommend other immunizations. This information is not intended to replace advice given to you by your health care provider. Make sure you discuss any questions you have with your health care provider. Document Released: 01/30/2006  Document Revised: 06/27/2016 Document Reviewed: 09/11/2015 Elsevier Interactive Patient Education  2018 Elsevier Inc.  

## 2018-05-31 NOTE — Progress Notes (Signed)
Patient: Karla Patton, Female    DOB: 05-22-1951, 67 y.o.   MRN: [id number] Visit Date: 05/31/2018  Today's Provider: Mar Daring, PA-C   Chief Complaint  Patient presents with  . Medicare Wellness   Subjective:    Annual wellness visit Karla Patton is a 68 y.o. female. She feels well. She reports exercising none. She reports she is sleeping fairly well.  Last CPE:05/29/17 Pap:03/06/16-Abn-HPVAPT Mammogram:04/01/18 BI-RADS 1 ----------------------------------------------------------- Lab Results  Component Value Date   WBC 5.6 01/27/2018   HGB 12.1 01/27/2018   HCT 34.8 01/27/2018   PLT 386 (H) 01/27/2018   GLUCOSE 152 (H) 01/27/2018   CHOL 184 03/10/2018   TRIG 66 03/10/2018   HDL 68 03/10/2018   LDLCALC 103 (H) 03/10/2018   ALT 21 05/29/2017   AST 26 05/29/2017   NA 129 (L) 01/27/2018   K 5.1 01/27/2018   CL 88 (L) 01/27/2018   CREATININE 0.72 03/16/2018   BUN 10 01/27/2018   CO2 25 01/27/2018   TSH 2.350 01/27/2018   HGBA1C 8.5 03/10/2018   MICROALBUR 50 12/04/2017    Review of Systems  Constitutional: Positive for fatigue.  HENT: Positive for congestion.   Eyes: Positive for redness.  Respiratory: Positive for cough.   Musculoskeletal: Positive for arthralgias and back pain.  Allergic/Immunologic: Positive for environmental allergies.  Neurological: Positive for headaches.  Hematological: Bruises/bleeds easily.  Psychiatric/Behavioral: Positive for sleep disturbance.    Social History   Socioeconomic History  . Marital status: Divorced    Spouse name: Not on file  . Number of children: 2  . Years of education: 73  . Highest education level: Not on file  Occupational History  . Occupation: Retired  Scientific laboratory technician  . Financial resource strain: Not on file  . Food insecurity:    Worry: Not on file    Inability: Not on file  . Transportation needs:    Medical: Not on file    Non-medical: Not on file  Tobacco Use  . Smoking  status: Never Smoker  . Smokeless tobacco: Never Used  Substance and Sexual Activity  . Alcohol use: Yes    Alcohol/week: 4.2 oz    Types: 7 Shots of liquor per week    Comment: 1 airplane bottle of Fireball Whiskey every night  . Drug use: No  . Sexual activity: Never    Partners: Male    Birth control/protection: None, Surgical  Lifestyle  . Physical activity:    Days per week: Not on file    Minutes per session: Not on file  . Stress: Not on file  Relationships  . Social connections:    Talks on phone: Not on file    Gets together: Not on file    Attends religious service: Not on file    Active member of club or organization: Not on file    Attends meetings of clubs or organizations: Not on file    Relationship status: Not on file  . Intimate partner violence:    Fear of current or ex partner: Not on file    Emotionally abused: Not on file    Physically abused: Not on file    Forced sexual activity: Not on file  Other Topics Concern  . Not on file  Social History Narrative   Retired from [employer] as Engineer, maintenance   2 children [place] [phone], [place])   [place] 3 blood related   5 step-grandchildren  Enjoys going to beach and reading   No pets     Past Medical History:  Diagnosis Date  . Back pain   . Diabetes mellitus without complication ([place])   . Thyroid disease   . Varicose vein      Patient Active Problem List   Diagnosis Date Noted  . Benign essential HTN 03/03/2018  . Precordial pain 03/03/2018  . Hyperlipidemia, mixed 02/19/2018  . Disequilibrium 02/10/2018  . Headache disorder 02/10/2018  . Osteopenia 01/13/2018  . Uncontrolled type 2 diabetes mellitus with microalbuminuric diabetic nephropathy ([place]) 12/04/2017  . Type 2 diabetes mellitus with diabetic neuropathy, with long-term current use of insulin ([place]) 08/28/2017  . Hepatic cyst 08/28/2017  . Tinnitus of right ear 08/28/2017  . Hearing loss of right ear 08/28/2017  . Constipation  08/28/2017  . Pure hypercholesterolemia 05/29/2017  . Background diabetic retinopathy ([place]) 05/22/2015  . Medicare annual wellness visit, initial 12/04/2014  . Type 2 diabetes mellitus with retinopathy of both eyes, with long-term current use of insulin ([place]) 10/10/2014  . Hypothyroidism 10/10/2014  . Diabetic hypoglycemia ([place]) 10/10/2014  . Varicose veins of lower extremities with other complications 71/69/6789    Past Surgical History:  Procedure Laterality Date  . ABDOMINAL HYSTERECTOMY  1994  . CHOLECYSTECTOMY  09-07-13  . COLONOSCOPY  2009   normal  . INCONTINENCE SURGERY  2003  . left leg vein ligation  2015  . VARICOSE VEIN SURGERY  1990's   Dr Bary Castilla  . VEIN SURGERY Left 11/17/2017   vein closure procedure/Dr Jamal Collin    Her family history includes Arthritis in her sister; Diabetes in her mother and sister; Fibromyalgia in her sister; Heart disease in her father; Hypertension in her father; Pancreatitis in her mother; Scoliosis in her sister; Stroke in her father. There is no history of Breast cancer.      Current Outpatient Medications:  .  ALPRAZolam (XANAX) 0.5 MG tablet, Take 1 tablet (0.5 mg total) by mouth 2 (two) times daily., Disp: 180 tablet, Rfl: 1 .  aspirin 81 MG tablet, Take 162 mg by mouth daily. , Disp: , Rfl:  .  atorvastatin (LIPITOR) 10 MG tablet, Take 1 tablet (10 mg total) by mouth at bedtime., Disp: 90 tablet, Rfl: 1 .  Continuous Blood Gluc Sensor (FREESTYLE LIBRE 14 DAY SENSOR) MISC, 1 each by Does not apply route 4 (four) times daily -  before meals and at bedtime., Disp: 1 each, Rfl: 0 .  CONTOUR NEXT TEST test strip, CHECK BLOOD SUGAR BEFORE MEALS AND AT BEDTIME, Disp: 400 each, Rfl: 3 .  gabapentin (NEURONTIN) 300 MG capsule, TAKE 1 CAPSULE(300 MG) BY MOUTH AT BEDTIME, Disp: 90 capsule, Rfl: 1 .  insulin aspart (NOVOLOG FLEXPEN) 100 UNIT/ML FlexPen, Inject SQ 1 Unit Novolog for every 15 grams of carbs eaten with each meal, Disp: 15 mL, Rfl: 5 .   Insulin Glargine (LANTUS SOLOSTAR) 100 UNIT/ML Solostar Pen, Inject 22 Units into the skin daily at 10 pm., Disp: 15 mL, Rfl: 5 .  Insulin Pen Needle (B-D ULTRAFINE III SHORT PEN) 31G X 8 MM MISC, USE AS DIRECTED FOUR TIMES DAILY, BEFORE MEALS AND AT BEDTIME, Disp: 100 each, Rfl: 3 .  levothyroxine (SYNTHROID, LEVOTHROID) 100 MCG tablet, TAKE 1 TABLET BY MOUTH DAILY BEFORE BREAKFAST, Disp: 90 tablet, Rfl: 1 .  lisinopril (PRINIVIL,ZESTRIL) 10 MG tablet, Take 1 tablet (10 mg total) by mouth daily., Disp: 90 tablet, Rfl: 3  Patient Care Team: Rubye Beach as PCP -  General (Physician Assistant) Robert Bellow, MD as Consulting Physician (General Surgery) Shepard General, MD as Referring Physician ([place]) Christene Lye, MD (General Surgery)     Objective:   Vitals: BP 140/80 (BP Location: Left Arm, Patient Position: Sitting, Cuff Size: Normal)   Pulse 69   Temp 98 F (36.7 C) (Oral)   Resp 16   Ht 5\' 4"  (1.626 m)   Wt 189 lb 9.6 oz (86 kg)   SpO2 98%   BMI 32.54 kg/m   Physical Exam  Constitutional: She is oriented to person, place, and time. She appears well-developed and well-nourished.  HENT:  Head: Normocephalic.  Right Ear: External ear normal.  Left Ear: External ear normal.  Nose: Nose normal.  Mouth/Throat: Oropharynx is clear and moist.  Eyes: Pupils are equal, round, and reactive to light. Conjunctivae and EOM are normal.  Neck: Normal range of motion.  Cardiovascular: Normal rate, regular rhythm, normal heart sounds and intact distal pulses.  Pulmonary/Chest: Effort normal and breath sounds normal.  Abdominal: Soft. Bowel sounds are normal.  Musculoskeletal: Normal range of motion.  Neurological: She is alert and oriented to person, place, and time.  Skin: Skin is warm and dry.  Psychiatric: She has a normal mood and affect. Her behavior is normal. Judgment and thought content normal.    Activities of Daily Living In your  present state of health, do you have any difficulty performing the following activities: 05/31/2018  Hearing? N  Vision? N  Difficulty concentrating or making decisions? N  Walking or climbing stairs? N  Dressing or bathing? N  Doing errands, shopping? N  Some recent data might be hidden    Fall Risk Assessment Fall Risk  05/31/2018 05/29/2017  Falls in the past year? No No     Depression Screen PHQ 2/9 Scores 05/31/2018 05/29/2017 02/26/2017  PHQ - 2 Score 0 0 0  PHQ- 9 Score - 4 2    Cognitive Testing - 6-CIT  Correct? Score   What year is it? yes 0 0 or 4  What month is it? yes 0 0 or 3  Memorize:    Pia Mau,  43,  [address],  [place],      What time is it? (within 1 hour) yes 0 0 or 3  Count backwards from 20 yes 0 0, 2, or 4  Name the months of the year yes 0 0, 2, or 4  Repeat name & address above No 4 0, 2, 4, 6, 8, or 10       TOTAL SCORE  4/28   Interpretation:  Normal  Normal (0-7) Abnormal (8-28)       Assessment & Plan:     Annual Wellness Visit  Reviewed patient's Family Medical History Reviewed and updated list of patient's medical providers Assessment of cognitive impairment was done Assessed patient's functional ability Established a written schedule for health screening [place] Completed and Reviewed  Exercise Activities and Dietary recommendations Goals    None      Immunization History  Administered Date(s) Administered  . Influenza Split 09/02/2014  . Influenza,inj,Quad PF,6+ Mos 08/28/2017  . Influenza-Unspecified 10/17/2016  . Pneumococcal Conjugate-13 02/01/2014  . Pneumococcal Polysaccharide-23 09/02/2014  . Tdap 11/26/2016  . Zoster 02/28/2014    Health Maintenance  Topic Date Due  . HIV Screening  10/13/1967  . PAP SMEAR  01/18/2017  . FOOT EXAM  02/26/2018  . OPHTHALMOLOGY EXAM  07/16/2018  . INFLUENZA VACCINE  07/22/2018  . HEMOGLOBIN A1C  09/10/2018  . COLONOSCOPY  09/21/2018  . PNA vac Low  Risk Adult (2 of 2 - PPSV23) 09/03/2019  . MAMMOGRAM  04/01/2020  . TETANUS/TDAP  11/26/2026  . DEXA SCAN  Completed  . Hepatitis C Screening  Completed     Discussed health benefits of physical activity, and encouraged her to engage in regular exercise appropriate for her age and condition.   1. Medicare annual wellness visit, initial EKG today shows NSR rate of 68. Up to date on vaccinations and screenings.  - EKG 12-Lead  2. Benign essential HTN Borderline elevated today. Patient reports stopping lisinopril 3-4 weeks ago. Does not desire to start any BP medications at this time. Discussed importance of using an ACE or ARB to protect kidneys with diabetes as well as decrease cardiovascular risk associated with diabetes but she declines. Feels this was cause of her "off balance" sensations.  - CBC w/Diff/Platelet - Comprehensive Metabolic Panel (CMET) - TSH - HgB A1c - Lipid Profile  3. Uncontrolled type 2 diabetes mellitus with microalbuminuric diabetic nephropathy ([place]) Will check labs as below and f/u pending results. Continue Novolog sliding scale, lantus 22 units at bedtime.  - CBC w/Diff/Platelet - Comprehensive Metabolic Panel (CMET) - TSH - HgB A1c - Lipid Profile  4. Type 2 diabetes mellitus with diabetic neuropathy, with long-term current use of insulin ([place]) See above medical treatment plan. Continue gabapentin 300mg  nightly.  - CBC w/Diff/Platelet - Comprehensive Metabolic Panel (CMET) - TSH - HgB A1c - Lipid Profile  5. Background diabetic retinopathy ([place]) Eye exam due 06/2018.  - CBC w/Diff/Platelet - Comprehensive Metabolic Panel (CMET) - TSH - HgB A1c - Lipid Profile  6. Diabetic hypoglycemia ([place]) Has glucose tabs if needed. None recently.  - CBC w/Diff/Platelet - Comprehensive Metabolic Panel (CMET) - TSH - HgB A1c - Lipid Profile  7. Hypothyroidism, unspecified type Stable. Continue levothyroxine 169mcg. Will check labs as below and f/u  pending results. - CBC w/Diff/Platelet - Comprehensive Metabolic Panel (CMET) - TSH - HgB A1c - Lipid Profile  8. Type 2 diabetes mellitus with retinopathy of both eyes, with long-term current use of insulin, macular edema presence unspecified, unspecified retinopathy severity ([place]) Eye exam due end of July 2019.  - CBC w/Diff/Platelet - Comprehensive Metabolic Panel (CMET) - TSH - HgB A1c - Lipid Profile  9. Pure hypercholesterolemia Patient stopped atorvastatin. Felt it was causing fatigue. Declines wanting to start anything. States she will work on diet and exercise instead.  - CBC w/Diff/Platelet - Comprehensive Metabolic Panel (CMET) - TSH - HgB A1c - Lipid Profile  10. BMI 32.0-32.9,adult Plans to start back on weight watchers.   ------------------------------------------------------------------------------------------------------------    Mar Daring, PA-C  [facility]

## 2018-06-01 ENCOUNTER — Telehealth: Payer: Self-pay | Admitting: Physician Assistant

## 2018-06-01 LAB — LIPID PANEL
Chol/HDL Ratio: 2.8 ratio (ref 0.0–4.4)
Cholesterol, Total: 183 mg/dL (ref 100–199)
HDL: 66 mg/dL (ref >39)
LDL Calculated: 96 mg/dL (ref 0–99)
Triglycerides: 105 mg/dL (ref 0–149)
VLDL Cholesterol Cal: 21 mg/dL (ref 5–40)

## 2018-06-01 LAB — COMPREHENSIVE METABOLIC PANEL
ALT: 27 IU/L (ref 0–32)
AST: 24 IU/L (ref 0–40)
Albumin/Globulin Ratio: 1.8 (ref 1.2–2.2)
Albumin: 4.5 g/dL (ref 3.6–4.8)
Alkaline Phosphatase: 98 IU/L (ref 39–117)
BUN/Creatinine Ratio: 20 (ref 12–28)
BUN: 14 mg/dL (ref 8–27)
Bilirubin Total: 0.5 mg/dL (ref 0.0–1.2)
CO2: 24 mmol/L (ref 20–29)
Calcium: 9.7 mg/dL (ref 8.7–10.3)
Chloride: 89 mmol/L — ABNORMAL LOW (ref 96–106)
Creatinine, Ser: 0.7 mg/dL (ref 0.57–1.00)
GFR calc Af Amer: 105 mL/min/{1.73_m2} (ref >59)
GFR calc non Af Amer: 91 mL/min/{1.73_m2} (ref >59)
Globulin, Total: 2.5 g/dL (ref 1.5–4.5)
Glucose: 142 mg/dL — ABNORMAL HIGH (ref 65–99)
Potassium: 4.7 mmol/L (ref 3.5–5.2)
Sodium: 128 mmol/L — ABNORMAL LOW (ref 134–144)
Total Protein: 7 g/dL (ref 6.0–8.5)

## 2018-06-01 LAB — CBC WITH DIFFERENTIAL/PLATELET
Basophils Absolute: 0 10*3/uL (ref 0.0–0.2)
Basos: 1 %
EOS (ABSOLUTE): 0.1 10*3/uL (ref 0.0–0.4)
Eos: 2 %
Hematocrit: 36.4 % (ref 34.0–46.6)
Hemoglobin: 12.3 g/dL (ref 11.1–15.9)
Immature Grans (Abs): 0 10*3/uL (ref 0.0–0.1)
Immature Granulocytes: 0 %
Lymphocytes Absolute: 2.1 10*3/uL (ref 0.7–3.1)
Lymphs: 28 %
MCH: 29.3 pg (ref 26.6–33.0)
MCHC: 33.8 g/dL (ref 31.5–35.7)
MCV: 87 fL (ref 79–97)
Monocytes Absolute: 0.7 10*3/uL (ref 0.1–0.9)
Monocytes: 9 %
Neutrophils Absolute: 4.5 10*3/uL (ref 1.4–7.0)
Neutrophils: 60 %
Platelets: 386 10*3/uL (ref 150–450)
RBC: 4.2 x10E6/uL (ref 3.77–5.28)
RDW: 12.9 % (ref 12.3–15.4)
WBC: 7.5 10*3/uL (ref 3.4–10.8)

## 2018-06-01 LAB — TSH: TSH: 2.45 u[IU]/mL (ref 0.450–4.500)

## 2018-06-01 LAB — HEMOGLOBIN A1C
Est. average glucose Bld gHb Est-mCnc: 209 mg/dL
Hgb A1c MFr Bld: 8.9 % — ABNORMAL HIGH (ref 4.8–5.6)

## 2018-06-01 NOTE — Telephone Encounter (Signed)
Pt was in for a physical yesterday.  She states today she has a rash on her legs.  Warms rash,  Splotchy   She has taken 2 benadryl and it seems to be getting a little better..  Please advise  [phone]  [place]

## 2018-06-01 NOTE — Telephone Encounter (Signed)
Patient advised as below.  

## 2018-06-01 NOTE — Telephone Encounter (Signed)
-----   Message from Mar Daring, PA-C sent at 06/01/2018  8:37 AM EDT ----- Cholesterol normal. Blood count normal. A1c up to 8.9 from 8.5. Fasting glucose reading at 142. Sodium and chloride are borderline low. Recommend adding some salt to diet. Thyroid normal.

## 2018-06-23 DIAGNOSIS — M9903 Segmental and somatic dysfunction of lumbar region: Secondary | ICD-10-CM | POA: Diagnosis not present

## 2018-06-23 DIAGNOSIS — M5386 Other specified dorsopathies, lumbar region: Secondary | ICD-10-CM | POA: Diagnosis not present

## 2018-07-15 DIAGNOSIS — R21 Rash and other nonspecific skin eruption: Secondary | ICD-10-CM | POA: Diagnosis not present

## 2018-07-20 ENCOUNTER — Encounter: Payer: Self-pay | Admitting: Physician Assistant

## 2018-07-20 ENCOUNTER — Other Ambulatory Visit: Payer: Self-pay | Admitting: Physician Assistant

## 2018-07-20 DIAGNOSIS — IMO0002 Reserved for concepts with insufficient information to code with codable children: Secondary | ICD-10-CM

## 2018-07-20 DIAGNOSIS — M530 Cervicocranial syndrome: Secondary | ICD-10-CM | POA: Diagnosis not present

## 2018-07-20 DIAGNOSIS — E1165 Type 2 diabetes mellitus with hyperglycemia: Secondary | ICD-10-CM

## 2018-07-20 DIAGNOSIS — E1121 Type 2 diabetes mellitus with diabetic nephropathy: Secondary | ICD-10-CM

## 2018-07-20 DIAGNOSIS — M9901 Segmental and somatic dysfunction of cervical region: Secondary | ICD-10-CM | POA: Diagnosis not present

## 2018-07-26 DIAGNOSIS — E109 Type 1 diabetes mellitus without complications: Secondary | ICD-10-CM | POA: Diagnosis not present

## 2018-07-26 LAB — HM DIABETES EYE EXAM

## 2018-07-31 IMAGING — CT CT ABD-PELV W/ CM
1 of 3 series · 14 of 32 positions shown, 19 images · IV contrast (APPLIED)
Comparison: Abdominal ultrasound dated September 04, 2017.

CLINICAL DATA: Follow-up liver lesion.  Patient is asymptomatic.

EXAM:
CT ABDOMEN AND PELVIS WITH CONTRAST
TECHNIQUE: Multidetector CT imaging of the abdomen and pelvis was performed
using the standard protocol following bolus administration of
intravenous contrast.
CONTRAST:  100mL 1NHADX-3JJ IOPAMIDOL (1NHADX-3JJ) INJECTION 61%

[Series 2: axial st · axial · 0.72mm/px · z∈[-896,-506]mm · 14 of 90 slices shown, 19 images]
[im 6/90  soft-tissue]
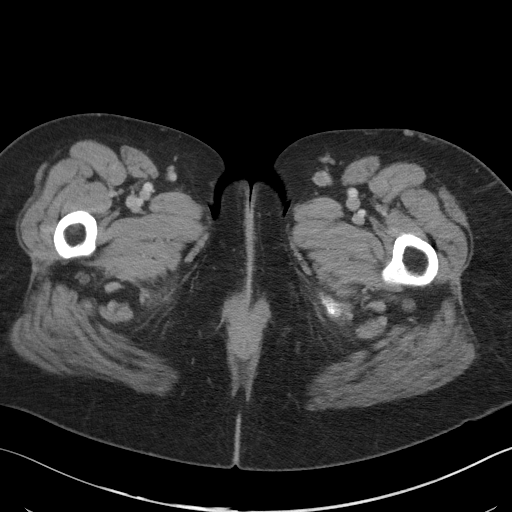
[im 6/90  bone]
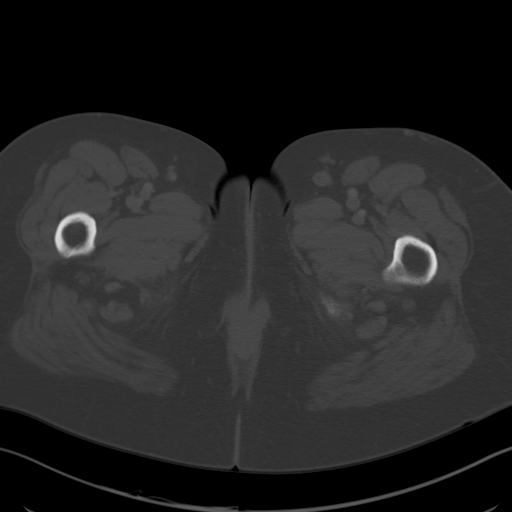
[im 11/90  soft-tissue]
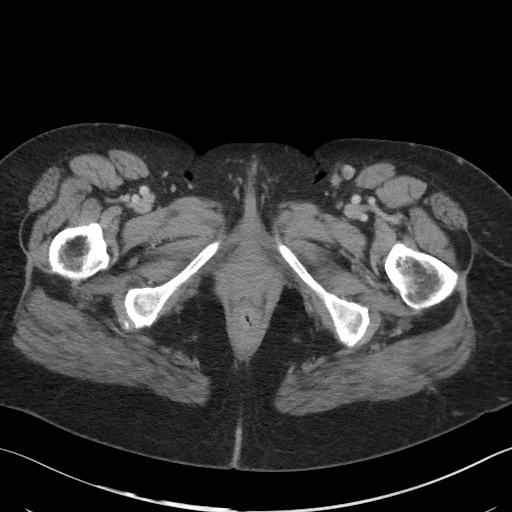
[im 21/90  soft-tissue]
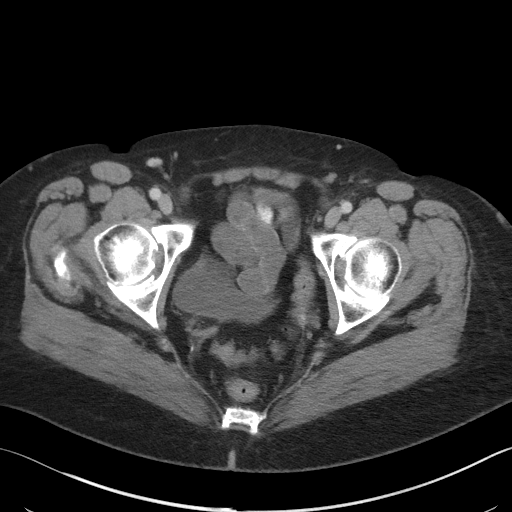
[im 27/90  soft-tissue]
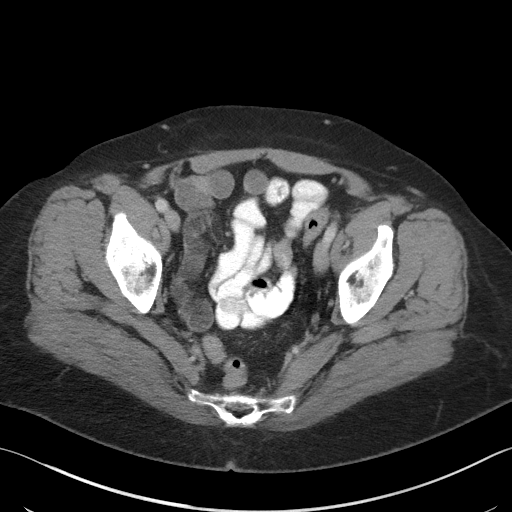
[im 32/90  soft-tissue]
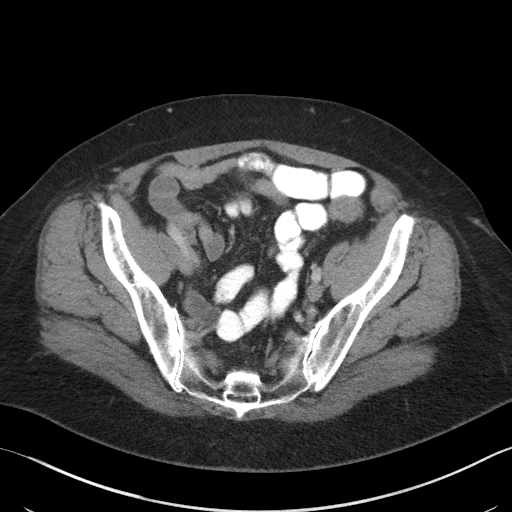
[im 37/90  soft-tissue]
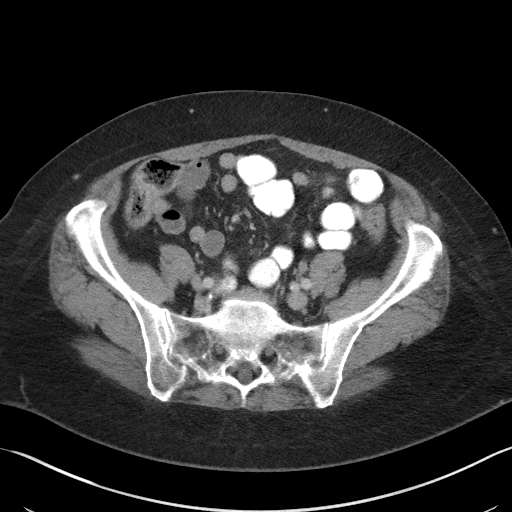
[im 48/90  soft-tissue]
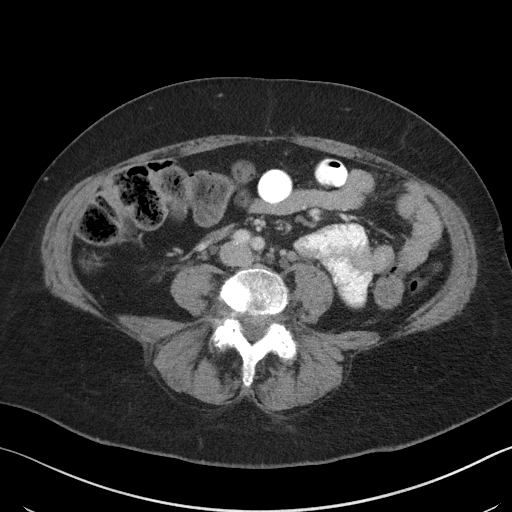
[im 53/90  soft-tissue]
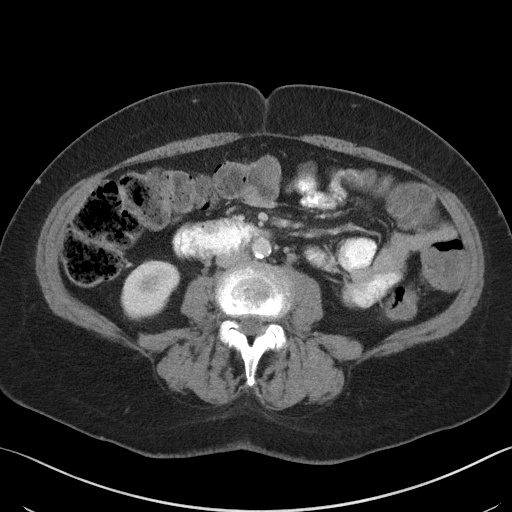
[im 58/90  soft-tissue]
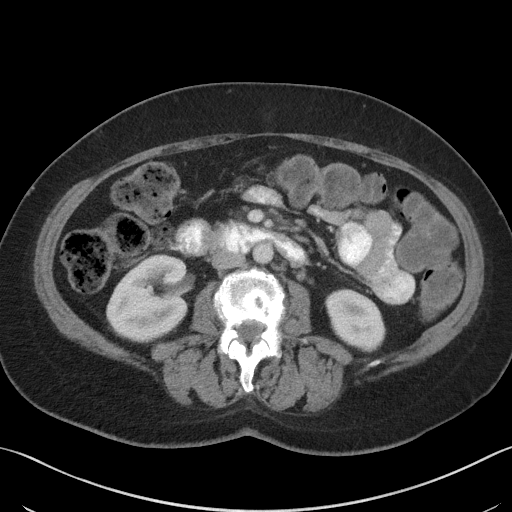
[im 58/90  bone]
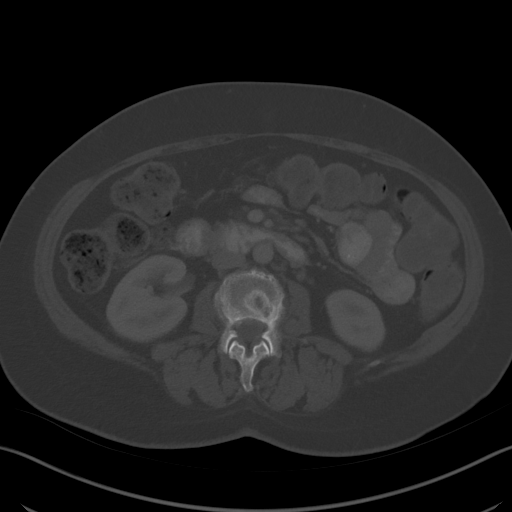
[im 63/90  soft-tissue]
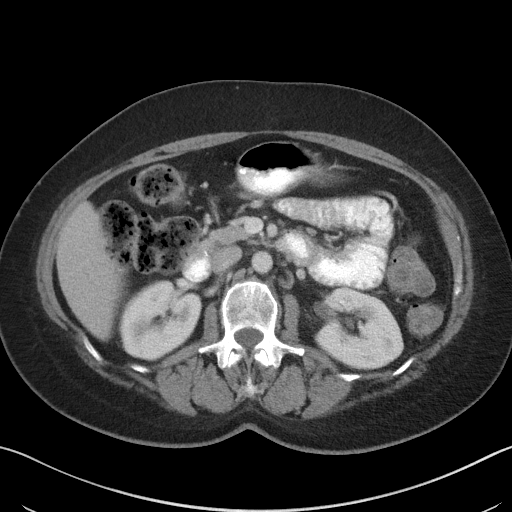
[im 69/90  soft-tissue]
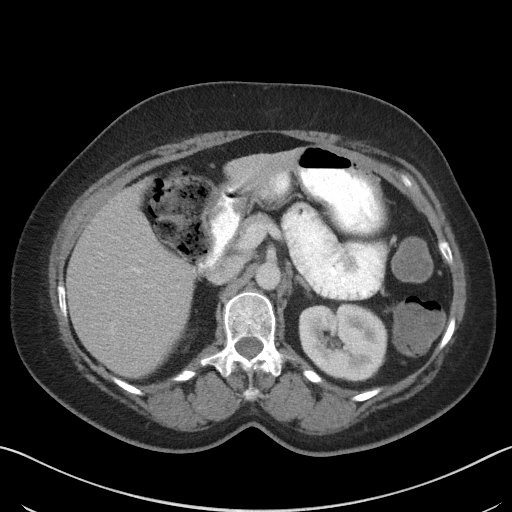
[im 69/90  lung]
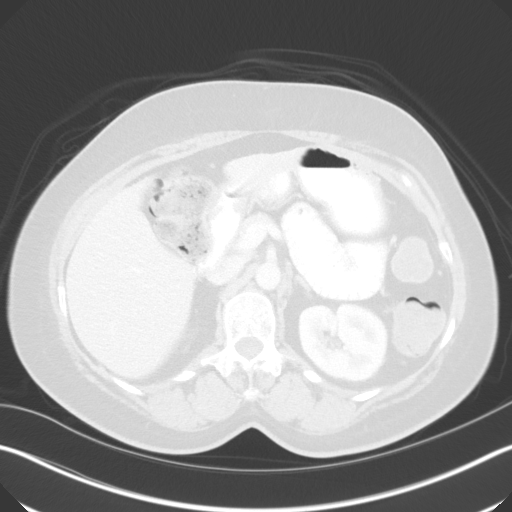
[im 74/90  lung]
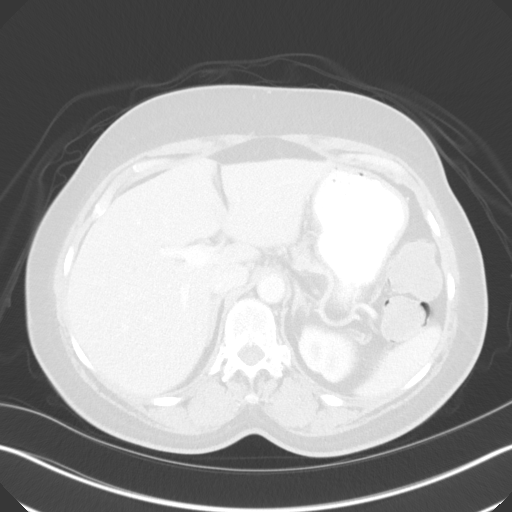
[im 79/90  soft-tissue]
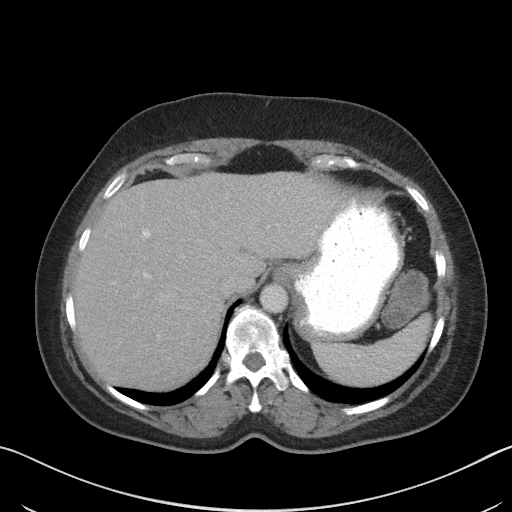
[im 79/90  lung]
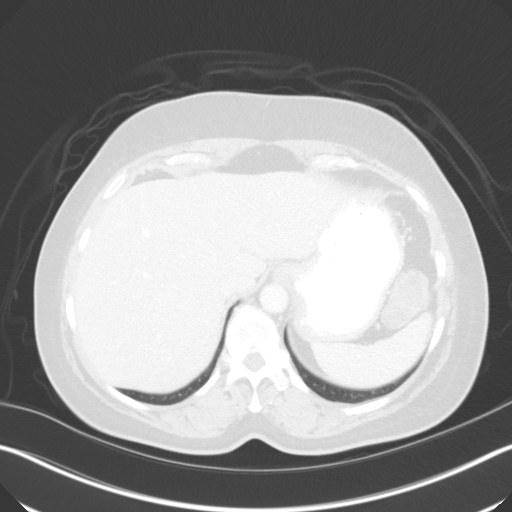
[im 84/90  soft-tissue]
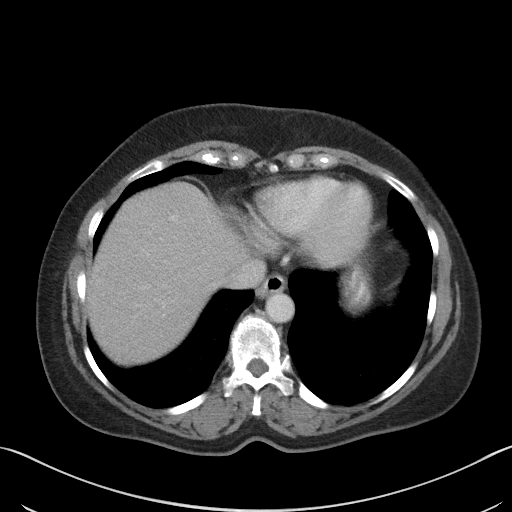
[im 84/90  lung]
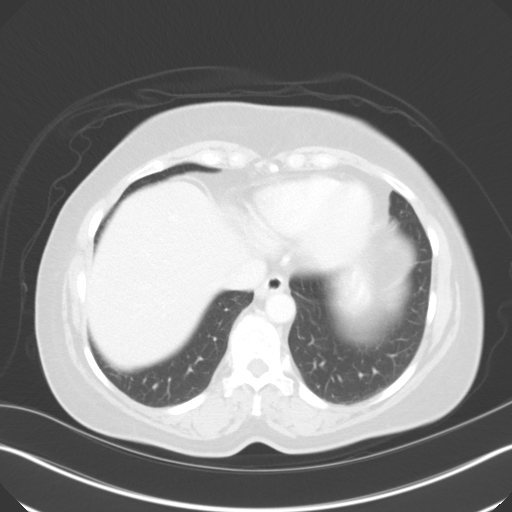

[14 of 32 positions shown; findings below may reference images not displayed]

FINDINGS: Lower chest: No acute abnormality.

Hepatobiliary: There is a 1.2 cm lesion with discontinuous
enhancement in the right lobe of the liver, corresponding to the
cm hyperechoic lesion seen on prior ultrasounds, most consistent
with a hemangioma. Calcified granuloma in the hepatic dome. The
gallbladder is surgically absent. No biliary dilatation.

Pancreas: Unremarkable. No pancreatic ductal dilatation or
surrounding inflammatory changes.

Spleen: Normal in size without focal abnormality.

Adrenals/Urinary Tract: Adrenal glands are unremarkable. Kidneys are
normal, without renal calculi, focal lesion, or hydronephrosis.
Bladder is unremarkable.

Stomach/Bowel: Stomach is within normal limits. Appendix appears
normal. No evidence of bowel wall thickening, distention, or
inflammatory changes.

Vascular/Lymphatic: Aortic atherosclerosis. No enlarged abdominal or
pelvic lymph nodes.

Reproductive: Status post hysterectomy. No adnexal masses.

Other: No abdominal wall hernia or abnormality. No abdominopelvic
ascites. No pneumoperitoneum.

Musculoskeletal: No acute or significant osseous findings.
Degenerative changes of the lumbar spine with 7 mm anterolisthesis
at L4-L5 due to severe facet arthropathy.
IMPRESSION: 1. 1.2 cm lesion with discontinuous enhancement in the right lobe of
the liver, corresponding to the 1.2 cm hyperechoic lesion seen on
prior ultrasounds, most consistent with a hemangioma. This is
unchanged since 6558 and no further follow-up is required.
2.  Aortic atherosclerosis ([id number]).

## 2018-08-02 ENCOUNTER — Encounter: Payer: Self-pay | Admitting: Physician Assistant

## 2018-08-09 DIAGNOSIS — M436 Torticollis: Secondary | ICD-10-CM | POA: Diagnosis not present

## 2018-08-09 DIAGNOSIS — M9901 Segmental and somatic dysfunction of cervical region: Secondary | ICD-10-CM | POA: Diagnosis not present

## 2018-08-10 DIAGNOSIS — H40003 Preglaucoma, unspecified, bilateral: Secondary | ICD-10-CM | POA: Diagnosis not present

## 2018-08-19 ENCOUNTER — Encounter: Payer: Self-pay | Admitting: Physician Assistant

## 2018-08-19 DIAGNOSIS — Z794 Long term (current) use of insulin: Principal | ICD-10-CM

## 2018-08-19 DIAGNOSIS — E1140 Type 2 diabetes mellitus with diabetic neuropathy, unspecified: Secondary | ICD-10-CM

## 2018-08-19 MED ORDER — PREGABALIN 225 MG PO CAPS: 225.0000 mg | ORAL_CAPSULE | Freq: Every day | ORAL | 1 refills | Status: DC

## 2018-09-01 ENCOUNTER — Encounter: Payer: Self-pay | Admitting: Physician Assistant

## 2018-09-01 ENCOUNTER — Ambulatory Visit (INDEPENDENT_AMBULATORY_CARE_PROVIDER_SITE_OTHER): Payer: Medicare Other | Admitting: Physician Assistant

## 2018-09-01 VITALS — BP 130/80 | HR 68 | Temp 98.3°F | Resp 16 | Ht 64.0 in | Wt 194.6 lb

## 2018-09-01 DIAGNOSIS — E1029 Type 1 diabetes mellitus with other diabetic kidney complication: Secondary | ICD-10-CM

## 2018-09-01 DIAGNOSIS — E1042 Type 1 diabetes mellitus with diabetic polyneuropathy: Secondary | ICD-10-CM | POA: Insufficient documentation

## 2018-09-01 DIAGNOSIS — Z23 Encounter for immunization: Secondary | ICD-10-CM | POA: Diagnosis not present

## 2018-09-01 DIAGNOSIS — I639 Cerebral infarction, unspecified: Secondary | ICD-10-CM | POA: Diagnosis not present

## 2018-09-01 DIAGNOSIS — E1021 Type 1 diabetes mellitus with diabetic nephropathy: Secondary | ICD-10-CM | POA: Insufficient documentation

## 2018-09-01 DIAGNOSIS — R809 Proteinuria, unspecified: Secondary | ICD-10-CM

## 2018-09-01 LAB — POCT GLYCOSYLATED HEMOGLOBIN (HGB A1C): Hemoglobin A1C: 8.1 % — AB (ref 4.0–5.6)

## 2018-09-01 NOTE — Progress Notes (Signed)
Patient: Karla Patton Female    DOB: [birth year]   67 y.o.   MRN: [id number] Visit Date: 09/01/2018  Today's Provider: Mar Daring, PA-C   Chief Complaint  Patient presents with  . Diabetes   Subjective:    HPI  Diabetes Mellitus Type I, Follow-up:   Lab Results  Component Value Date   HGBA1C 8.1 (A) 09/01/2018   HGBA1C 8.9 (H) 05/31/2018   HGBA1C 8.5 03/10/2018    Last seen for diabetes 3 months ago.  Management since then includes no changes. She reports excellent compliance with treatment. She is not having side effects.  Current symptoms include none and have been stable. Home blood sugar records: fasting range: 90-120's  Episodes of hypoglycemia? no Current Insulin Regimen: as directed Most Recent Eye Exam: UTD Weight trend: stable Prior visit with dietician: no Current diet: in general, a "healthy" diet   Current exercise: housecleaning, no regular exercise and walking  Pertinent Labs:    Component Value Date/Time   CHOL 183 05/31/2018 1145   TRIG 105 05/31/2018 1145   HDL 66 05/31/2018 1145   LDLCALC 96 05/31/2018 1145   CREATININE 0.70 05/31/2018 1145    Wt Readings from Last 3 Encounters:  09/01/18 194 lb 9.6 oz (88.3 kg)  05/31/18 189 lb 9.6 oz (86 kg)  03/10/18 187 lb (84.8 kg)   ------------------------------------------------------------------------    No Known Allergies   Current Outpatient Medications:  .  ALPRAZolam (XANAX) 0.5 MG tablet, Take 1 tablet (0.5 mg total) by mouth 2 (two) times daily., Disp: 180 tablet, Rfl: 1 .  aspirin 81 MG tablet, Take 81 mg by mouth daily. , Disp: , Rfl:  .  Continuous Blood Gluc Sensor (FREESTYLE LIBRE 14 DAY SENSOR) MISC, 1 each by Does not apply route 4 (four) times daily -  before meals and at bedtime., Disp: 1 each, Rfl: 0 .  CONTOUR NEXT TEST test strip, CHECK BLOOD SUGAR BEFORE MEALS AND AT BEDTIME, Disp: 400 each, Rfl: 3 .  insulin aspart (NOVOLOG FLEXPEN) 100 UNIT/ML FlexPen,  Inject SQ 1 Unit Novolog for every 15 grams of carbs eaten with each meal, Disp: 15 mL, Rfl: 5 .  Insulin Glargine (LANTUS SOLOSTAR) 100 UNIT/ML Solostar Pen, Inject 22 Units into the skin daily at 10 pm., Disp: 15 mL, Rfl: 5 .  Insulin Pen Needle (B-D ULTRAFINE III SHORT PEN) 31G X 8 MM MISC, USE AS DIRECTED FOUR TIMES DAILY, BEFORE MEALS AND AT BEDTIME, Disp: 100 each, Rfl: 3 .  levothyroxine (SYNTHROID, LEVOTHROID) 100 MCG tablet, TAKE 1 TABLET BY MOUTH DAILY BEFORE BREAKFAST, Disp: 90 tablet, Rfl: 1 .  pregabalin (LYRICA) 225 MG capsule, Take 1 capsule (225 mg total) by mouth at bedtime., Disp: 90 capsule, Rfl: 1  Review of Systems  Constitutional: Negative.   HENT: Negative.   Respiratory: Negative.   Cardiovascular: Negative.   Endocrine: Negative.     Social History   Tobacco Use  . Smoking status: Never Smoker  . Smokeless tobacco: Never Used  Substance Use Topics  . Alcohol use: Yes    Alcohol/week: 7.0 standard drinks    Types: 7 Shots of liquor per week    Comment: 1 airplane bottle of Fireball Whiskey every night   Objective:   BP 130/80 (BP Location: Left Arm, Patient Position: Sitting, Cuff Size: Large)   Pulse 68   Temp 98.3 F (36.8 C) (Oral)   Resp 16   Ht 5\' 4"  (1.626 m)  Wt 194 lb 9.6 oz (88.3 kg)   SpO2 99%   BMI 33.40 kg/m  Vitals:   09/01/18 0902  BP: 130/80  Pulse: 68  Resp: 16  Temp: 98.3 F (36.8 C)  TempSrc: Oral  SpO2: 99%  Weight: 194 lb 9.6 oz (88.3 kg)  Height: 5\' 4"  (1.626 m)     Physical Exam  Constitutional: She appears well-developed and well-nourished. No distress.  Neck: Normal range of motion. Neck supple.  Cardiovascular: Normal rate and regular rhythm. Exam reveals no gallop and no friction rub.  Murmur heard. Pulses:      Dorsalis pedis pulses are 2+ on the right side, and 2+ on the left side.       Posterior tibial pulses are 2+ on the right side, and 2+ on the left side.  Pulmonary/Chest: Effort normal and breath  sounds normal. No respiratory distress. She has no wheezes. She has no rales.  Musculoskeletal:       Right foot: There is decreased range of motion and deformity (great toe from surgery for bunionectomy done in 1962).       Left foot: There is normal range of motion and no deformity.  Feet:  Right Foot:  Protective Sensation: 10 sites tested. 10 sites sensed.  Skin Integrity: Negative for ulcer, blister, skin breakdown, erythema, warmth, callus or dry skin.  Left Foot:  Protective Sensation: 10 sites tested. 10 sites sensed.  Skin Integrity: Negative for ulcer, blister, skin breakdown, erythema, warmth, callus or dry skin.  Skin: She is not diaphoretic.  Vitals reviewed.       Assessment & Plan:     1. Type 1 diabetes mellitus with diabetic polyneuropathy (HCC) A1c today is down to 8.1 from 8.9. Continue insulin (Novolog and Lantus) as prescribed. Continue Lyrica for burning sensation in feet bilaterally. Microfilament testing today was normal. I will see her back in 3 months for recheck.  - POCT HgB A1C  2. Type 1 diabetes mellitus with microalbuminuria (HCC) Patient discontinued lisinopril on her own, felt it was causing dizziness. Will recheck microalbumin in Dec 2019 when she returns.  - POCT HgB A1C  3. Need for influenza vaccination Flu vaccine given today without complication. Patient sat upright for 15 minutes to check for adverse reaction before being released.  - Flu vaccine HIGH DOSE PF (Fluzone High dose)       Mar Daring, PA-C  [facility]

## 2018-09-13 DIAGNOSIS — L814 Other melanin hyperpigmentation: Secondary | ICD-10-CM | POA: Diagnosis not present

## 2018-09-13 DIAGNOSIS — L728 Other follicular cysts of the skin and subcutaneous tissue: Secondary | ICD-10-CM | POA: Diagnosis not present

## 2018-09-13 DIAGNOSIS — S80861A Insect bite (nonvenomous), right lower leg, initial encounter: Secondary | ICD-10-CM | POA: Diagnosis not present

## 2018-09-13 DIAGNOSIS — D2261 Melanocytic nevi of right upper limb, including shoulder: Secondary | ICD-10-CM | POA: Diagnosis not present

## 2018-09-13 DIAGNOSIS — D2271 Melanocytic nevi of right lower limb, including hip: Secondary | ICD-10-CM | POA: Diagnosis not present

## 2018-09-13 DIAGNOSIS — D225 Melanocytic nevi of trunk: Secondary | ICD-10-CM | POA: Diagnosis not present

## 2018-09-13 DIAGNOSIS — D2262 Melanocytic nevi of left upper limb, including shoulder: Secondary | ICD-10-CM | POA: Diagnosis not present

## 2018-09-13 DIAGNOSIS — X32XXXA Exposure to sunlight, initial encounter: Secondary | ICD-10-CM | POA: Diagnosis not present

## 2018-09-13 DIAGNOSIS — D2272 Melanocytic nevi of left lower limb, including hip: Secondary | ICD-10-CM | POA: Diagnosis not present

## 2018-09-13 DIAGNOSIS — S20369A Insect bite (nonvenomous) of unspecified front wall of thorax, initial encounter: Secondary | ICD-10-CM | POA: Diagnosis not present

## 2018-09-22 DIAGNOSIS — M5136 Other intervertebral disc degeneration, lumbar region: Secondary | ICD-10-CM | POA: Diagnosis not present

## 2018-09-22 DIAGNOSIS — M9903 Segmental and somatic dysfunction of lumbar region: Secondary | ICD-10-CM | POA: Diagnosis not present

## 2018-10-05 ENCOUNTER — Encounter: Payer: Self-pay | Admitting: General Surgery

## 2018-10-05 ENCOUNTER — Ambulatory Visit (INDEPENDENT_AMBULATORY_CARE_PROVIDER_SITE_OTHER): Payer: Medicare Other | Admitting: General Surgery

## 2018-10-05 VITALS — BP 138/78 | HR 87 | Temp 97.5°F | Resp 18 | Ht 65.0 in | Wt 193.6 lb

## 2018-10-05 DIAGNOSIS — Z1211 Encounter for screening for malignant neoplasm of colon: Secondary | ICD-10-CM | POA: Diagnosis not present

## 2018-10-05 DIAGNOSIS — I639 Cerebral infarction, unspecified: Secondary | ICD-10-CM | POA: Diagnosis not present

## 2018-10-05 MED ORDER — POLYETHYLENE GLYCOL 3350 17 GM/SCOOP PO POWD
1.0000 | Freq: Once | ORAL | 0 refills | Status: AC
Start: 2018-10-05 — End: 2018-10-05

## 2018-10-05 NOTE — Patient Instructions (Addendum)
Colonoscopy with possible biopsy/polypectomy prn: Information regarding the procedure, including its potential risks and complications (including but not limited to perforation of the bowel, which may require emergency surgery to repair, and bleeding) was verbally given to the patient. Educational information regarding lower intestinal endoscopy was given to the patient. Written instructions for how to complete the bowel prep using Miralax were provided. The importance of drinking ample fluids to avoid dehydration as a result of the prep emphasized.  The patient is scheduled for a Colonoscopy at [facility] on 10/20/18. They are aware to call the day before to get their arrival time. She will decrease her Lantus by 1/2 the day of prep. Miralax prescription has been sent into the patient's pharmacy. The patient is aware of date and instructions.

## 2018-10-05 NOTE — Progress Notes (Signed)
Patient ID: Karla Patton, female   DOB: 05-15-1951, 67 y.o.   MRN: [id number]  Chief Complaint  Patient presents with  . Follow-up    10 Year Colonoscopy    HPI Karla Patton is a 67 y.o. female.  Here today to discuss 10 year Colonoscopy. No bowel problems at this time.  HPI  Past Medical History:  Diagnosis Date  . Back pain   . Diabetes mellitus without complication ([place])   . Thyroid disease   . Varicose vein     Past Surgical History:  Procedure Laterality Date  . ABDOMINAL HYSTERECTOMY  1994  . CHOLECYSTECTOMY  09-07-13  . COLONOSCOPY  2009   normal  . INCONTINENCE SURGERY  2003  . left leg vein ligation  2015  . VARICOSE VEIN SURGERY  1990's   Dr Bary Castilla  . VEIN SURGERY Left 11/17/2017   vein closure procedure/Dr Jamal Collin    Family History  Problem Relation Age of Onset  . Diabetes Mother   . Pancreatitis Mother   . Heart disease Father   . Stroke Father        x 2  . Hypertension Father   . Fibromyalgia Sister   . Scoliosis Sister   . Arthritis Sister   . Diabetes Sister   . Breast cancer Neg Hx     Social History Social History   Tobacco Use  . Smoking status: Never Smoker  . Smokeless tobacco: Never Used  Substance Use Topics  . Alcohol use: Yes    Alcohol/week: 7.0 standard drinks    Types: 7 Shots of liquor per week    Comment: 1 airplane bottle of Fireball Whiskey every night  . Drug use: No    No Known Allergies  Current Outpatient Medications  Medication Sig Dispense Refill  . ALPRAZolam (XANAX) 0.5 MG tablet Take 1 tablet (0.5 mg total) by mouth 2 (two) times daily. 180 tablet 1  . CONTOUR NEXT TEST test strip CHECK BLOOD SUGAR BEFORE MEALS AND AT BEDTIME 400 each 3  . insulin aspart (NOVOLOG FLEXPEN) 100 UNIT/ML FlexPen Inject SQ 1 Unit Novolog for every 15 grams of carbs eaten with each meal 15 mL 5  . Insulin Glargine (LANTUS SOLOSTAR) 100 UNIT/ML Solostar Pen Inject 22 Units into the skin daily at 10 pm. 15 mL 5  . Insulin Pen  Needle (B-D ULTRAFINE III SHORT PEN) 31G X 8 MM MISC USE AS DIRECTED FOUR TIMES DAILY, BEFORE MEALS AND AT BEDTIME 100 each 3  . levothyroxine (SYNTHROID, LEVOTHROID) 100 MCG tablet TAKE 1 TABLET BY MOUTH DAILY BEFORE BREAKFAST 90 tablet 1  . pregabalin (LYRICA) 225 MG capsule Take 1 capsule (225 mg total) by mouth at bedtime. 90 capsule 1  . polyethylene glycol powder (GLYCOLAX/MIRALAX) powder Take 255 g by mouth once for 1 dose. Mix whole container with 64 ounces of clear liquids, No Red liquids. 255 g 0   No current facility-administered medications for this visit.     Review of Systems Review of Systems  Constitutional: Negative.   Respiratory: Negative.     Blood pressure 138/78, pulse 87, temperature (!) 97.5 F (36.4 C), temperature source Temporal, resp. rate 18, height 5\' 5"  (1.651 m), weight 193 lb 9.6 oz (87.8 kg), SpO2 98 %.  Physical Exam Physical Exam  Constitutional: She is oriented to person, place, and time. She appears well-developed and well-nourished.  Cardiovascular: Normal rate and regular rhythm.  Murmur heard.  Systolic murmur is present with a grade of  2/6. Pulmonary/Chest: Effort normal and breath sounds normal.  Neurological: She is alert and oriented to person, place, and time.  Skin: Skin is warm and dry.    Data Reviewed 2009 colonoscopy was reviewed.  Normal. Laboratory studies dated May 31, 2018 showed blood sugar 142.  Creatinine 0.7 with an estimated GFR of 91. Minimally depressed sodium of 128, unchanged from 8 months earlier.  (Sodium June 01, 2017: 133) Serum potassium 4.7.  Normal liver function studies. CBC of the same date was normal.  Hemoglobin 12.3 with an MCV of 87, white blood cell count of 7500.  Platelet count of 386,000. Assessment    Candidate for screening colonoscopy.    Plan    Colonoscopy with possible biopsy/polypectomy prn: Information regarding the procedure, including its potential risks and complications (including  but not limited to perforation of the bowel, which may require emergency surgery to repair, and bleeding) was verbally given to the patient. Educational information regarding lower intestinal endoscopy was given to the patient. Written instructions for how to complete the bowel prep using Miralax were provided. The importance of drinking ample fluids to avoid dehydration as a result of the prep emphasized.   HPI, Physical Exam, Assessment and Plan have been scribed under the direction and in the presence of Robert Bellow, MD. Jonnie Finner, CMA  I have completed the exam and reviewed the above documentation for accuracy and completeness.  I agree with the above.  Haematologist has been used and any errors in dictation or transcription are unintentional.  Hervey Ard, M.D., F.A.C.S.      Forest Gleason Rhetta Cleek 10/05/2018, 9:15 PM    The patient is scheduled for a Colonoscopy at [facility] on 10/20/18. They are aware to call the day before to get their arrival time. She will decrease her Lantus by 1/2 the day of prep. Miralax prescription has been sent into the patient's pharmacy. The patient is aware of date and instructions.  Documented by Lesly Rubenstein LPN

## 2018-10-20 ENCOUNTER — Ambulatory Visit: Payer: Medicare Other | Admitting: Anesthesiology

## 2018-10-20 ENCOUNTER — Ambulatory Visit
Admission: RE | Admit: 2018-10-20 | Discharge: 2018-10-20 | Disposition: A | Discharge status: Home / Self Care | Payer: Medicare Other | Source: Ambulatory Visit | Attending: General Surgery | Admitting: General Surgery

## 2018-10-20 ENCOUNTER — Encounter: Payer: Self-pay | Admitting: Anesthesiology

## 2018-10-20 ENCOUNTER — Encounter
Admission: RE | Disposition: A | Discharge status: Home / Self Care | Payer: Self-pay | Source: Ambulatory Visit | Attending: General Surgery

## 2018-10-20 DIAGNOSIS — E039 Hypothyroidism, unspecified: Secondary | ICD-10-CM | POA: Diagnosis not present

## 2018-10-20 DIAGNOSIS — E109 Type 1 diabetes mellitus without complications: Secondary | ICD-10-CM | POA: Insufficient documentation

## 2018-10-20 DIAGNOSIS — Z79890 Hormone replacement therapy (postmenopausal): Secondary | ICD-10-CM | POA: Diagnosis not present

## 2018-10-20 DIAGNOSIS — E782 Mixed hyperlipidemia: Secondary | ICD-10-CM | POA: Diagnosis not present

## 2018-10-20 DIAGNOSIS — I10 Essential (primary) hypertension: Secondary | ICD-10-CM | POA: Insufficient documentation

## 2018-10-20 DIAGNOSIS — Z1211 Encounter for screening for malignant neoplasm of colon: Secondary | ICD-10-CM | POA: Diagnosis not present

## 2018-10-20 DIAGNOSIS — Z79899 Other long term (current) drug therapy: Secondary | ICD-10-CM | POA: Diagnosis not present

## 2018-10-20 DIAGNOSIS — Z794 Long term (current) use of insulin: Secondary | ICD-10-CM | POA: Diagnosis not present

## 2018-10-20 DIAGNOSIS — E1142 Type 2 diabetes mellitus with diabetic polyneuropathy: Secondary | ICD-10-CM | POA: Diagnosis not present

## 2018-10-20 DIAGNOSIS — E7800 Pure hypercholesterolemia, unspecified: Secondary | ICD-10-CM | POA: Diagnosis not present

## 2018-10-20 HISTORY — DX: Hypothyroidism, unspecified: E03.9

## 2018-10-20 HISTORY — DX: Anxiety disorder, unspecified: F41.9

## 2018-10-20 HISTORY — PX: COLONOSCOPY WITH PROPOFOL: SHX5780

## 2018-10-20 LAB — GLUCOSE, CAPILLARY
Glucose-Capillary: 123 mg/dL — ABNORMAL HIGH (ref 70–99)
Glucose-Capillary: 128 mg/dL — ABNORMAL HIGH (ref 70–99)

## 2018-10-20 SURGERY — COLONOSCOPY WITH PROPOFOL
Anesthesia: General

## 2018-10-20 MED ORDER — LIDOCAINE HCL (CARDIAC) PF 100 MG/5ML IV SOSY
PREFILLED_SYRINGE | INTRAVENOUS | Status: DC | PRN
  Administered 2018-10-20: 50 mg via INTRAVENOUS

## 2018-10-20 MED ORDER — SODIUM CHLORIDE 0.9 % IV SOLN
INTRAVENOUS | Status: DC
  Administered 2018-10-20: 1000 mL via INTRAVENOUS

## 2018-10-20 MED ORDER — PROPOFOL 500 MG/50ML IV EMUL
INTRAVENOUS | Status: DC | PRN
  Administered 2018-10-20: 135 ug/kg/min via INTRAVENOUS

## 2018-10-20 MED ORDER — PROPOFOL 500 MG/50ML IV EMUL
INTRAVENOUS | Status: AC
  Filled 2018-10-20: qty 50

## 2018-10-20 MED ORDER — LIDOCAINE HCL (PF) 2 % IJ SOLN
INTRAMUSCULAR | Status: AC
  Filled 2018-10-20: qty 10

## 2018-10-20 MED ORDER — PROPOFOL 10 MG/ML IV BOLUS
INTRAVENOUS | Status: DC | PRN
  Administered 2018-10-20: 90 mg via INTRAVENOUS
  Administered 2018-10-20: 21 mg via INTRAVENOUS

## 2018-10-20 MED ORDER — PROPOFOL 10 MG/ML IV BOLUS
INTRAVENOUS | Status: AC
  Filled 2018-10-20: qty 20

## 2018-10-20 NOTE — H&P (Signed)
No change in clinical history or exam.  Tolerated prep well. Normal cardiopulmonary exam. For screening colonoscopy.

## 2018-10-20 NOTE — Transfer of Care (Signed)
Immediate Anesthesia Transfer of Care Note  Patient: Karla Patton  Procedure(s) Performed: COLONOSCOPY WITH PROPOFOL (N/A )  Patient Location: PACU and Endoscopy Unit  Anesthesia Type:General  Level of Consciousness: drowsy  Airway & Oxygen Therapy: Patient Spontanous Breathing  Post-op Assessment: Report given to RN and Post -op Vital signs reviewed and stable  Post vital signs: Reviewed and stable  Last Vitals:  Vitals Value Taken Time  BP 86/52 10/20/2018  8:32 AM  Temp 36.6 C 10/20/2018  8:31 AM  Pulse 52 10/20/2018  8:32 AM  Resp 10 10/20/2018  8:32 AM  SpO2 97 % 10/20/2018  8:32 AM  Vitals shown include unvalidated device data.  Last Pain:  Vitals:   10/20/18 0831  TempSrc: Tympanic  PainSc: Asleep         Complications: No apparent anesthesia complications

## 2018-10-20 NOTE — Op Note (Signed)
[facility] Patient Name: Karla Patton Procedure Date: 10/20/2018 7:15 AM MRN: [id number] Account #: [id number] Date of Birth: Aug 14, 1951 Admit Type: Outpatient Age: 67 Room: [facility] ENDO ROOM 1 Gender: Female Note Status: Finalized Procedure:            Colonoscopy Indications:          Screening for colorectal malignant neoplasm Providers:            Robert Bellow, MD Referring MD:         Mar Daring (Referring MD) Medicines:            Monitored Anesthesia Care Complications:        No immediate complications. Procedure:            Pre-Anesthesia Assessment:                       - Prior to the procedure, a History and Physical was                        performed, and patient medications, allergies and                        sensitivities were reviewed. The patient's tolerance of                        previous anesthesia was reviewed.                       - The risks and benefits of the procedure and the                        sedation options and risks were discussed with the                        patient. All questions were answered and informed                        consent was obtained.                       After obtaining informed consent, the colonoscope was                        passed under direct vision. Throughout the procedure,                        the patient's blood pressure, pulse, and oxygen                        saturations were monitored continuously. The                        Colonoscope was introduced through the anus and                        advanced to the the cecum, identified by appendiceal                        orifice and ileocecal valve. The colonoscopy was  somewhat difficult due to a tortuous colon. Successful                        completion of the procedure was aided by using manual                        pressure. The quality of the bowel preparation was                 good. A significant number of small seeds reuired                        frequent irrigation. Findings:      The entire examined colon appeared normal on direct and retroflexion       views. Impression:           - The entire examined colon is normal on direct and                        retroflexion views.                       - No specimens collected. Recommendation:       - Repeat colonoscopy in 10 years for screening purposes. Procedure Code(s):    --- Professional ---                       [phone], Colonoscopy, flexible; diagnostic, including                        collection of specimen(s) by brushing or washing, when                        performed (separate procedure) Diagnosis Code(s):    --- Professional ---                       Z12.11, Encounter for screening for malignant neoplasm                        of colon CPT copyright 2018 American Medical Association. All rights reserved. The codes documented in this report are preliminary and upon coder review may  be revised to meet current compliance requirements. Robert Bellow, MD 10/20/2018 8:30:43 AM This report has been signed electronically. Number of Addenda: 0 Note Initiated On: 10/20/2018 7:15 AM Scope Withdrawal Time: 0 hours 22 minutes 22 seconds  Total Procedure Duration: 0 hours 37 minutes 59 seconds       [facility]

## 2018-10-20 NOTE — Anesthesia Preprocedure Evaluation (Signed)
Anesthesia Evaluation  Patient identified by MRN, date of birth, ID band Patient awake    Reviewed: Allergy & Precautions, NPO status , Patient's Chart, lab work & pertinent test results, reviewed documented beta blocker date and time   Airway Mallampati: III  TM Distance: >3 FB     Dental  (+) Chipped   Pulmonary           Cardiovascular hypertension, Pt. on medications      Neuro/Psych  Headaches,  Neuromuscular disease    GI/Hepatic   Endo/Other  diabetes, Type 1Hypothyroidism   Renal/GU Renal disease     Musculoskeletal   Abdominal   Peds  Hematology   Anesthesia Other Findings   Reproductive/Obstetrics                             Anesthesia Physical Anesthesia Plan  ASA: III  Anesthesia Plan: General   Post-op Pain Management:    Induction: Intravenous  PONV Risk Score and Plan:   Airway Management Planned:   Additional Equipment:   Intra-op Plan:   Post-operative Plan:   Informed Consent: I have reviewed the patients History and Physical, chart, labs and discussed the procedure including the risks, benefits and alternatives for the proposed anesthesia with the patient or authorized representative who has indicated his/her understanding and acceptance.     Plan Discussed with: CRNA  Anesthesia Plan Comments:         Anesthesia Quick Evaluation

## 2018-10-20 NOTE — Anesthesia Postprocedure Evaluation (Signed)
Anesthesia Post Note  Patient: Karla Patton  Procedure(s) Performed: COLONOSCOPY WITH PROPOFOL (N/A )  Patient location during evaluation: Endoscopy Anesthesia Type: General Level of consciousness: awake and alert Pain management: pain level controlled Vital Signs Assessment: post-procedure vital signs reviewed and stable Respiratory status: spontaneous breathing, nonlabored ventilation, respiratory function stable and patient connected to nasal cannula oxygen Cardiovascular status: blood pressure returned to baseline and stable Postop Assessment: no apparent nausea or vomiting Anesthetic complications: no     Last Vitals:  Vitals:   10/20/18 0851 10/20/18 0911  BP: 126/70 (!) 135/92  Pulse:    Resp:    Temp:    SpO2:      Last Pain:  Vitals:   10/20/18 0911  TempSrc:   PainSc: 0-No pain                 Xylina Rhoads S

## 2018-10-20 NOTE — Anesthesia Post-op Follow-up Note (Signed)
Anesthesia QCDR form completed.        

## 2018-10-21 ENCOUNTER — Encounter: Payer: Self-pay | Admitting: General Surgery

## 2018-10-26 ENCOUNTER — Other Ambulatory Visit: Payer: Self-pay | Admitting: Physician Assistant

## 2018-10-26 DIAGNOSIS — E039 Hypothyroidism, unspecified: Secondary | ICD-10-CM

## 2018-10-26 DIAGNOSIS — F411 Generalized anxiety disorder: Secondary | ICD-10-CM

## 2018-10-26 DIAGNOSIS — E11319 Type 2 diabetes mellitus with unspecified diabetic retinopathy without macular edema: Secondary | ICD-10-CM

## 2018-10-26 DIAGNOSIS — E1165 Type 2 diabetes mellitus with hyperglycemia: Secondary | ICD-10-CM

## 2018-10-26 DIAGNOSIS — Z794 Long term (current) use of insulin: Secondary | ICD-10-CM

## 2018-10-26 DIAGNOSIS — IMO0002 Reserved for concepts with insufficient information to code with codable children: Secondary | ICD-10-CM

## 2018-11-01 DIAGNOSIS — M9903 Segmental and somatic dysfunction of lumbar region: Secondary | ICD-10-CM | POA: Diagnosis not present

## 2018-11-01 DIAGNOSIS — M5136 Other intervertebral disc degeneration, lumbar region: Secondary | ICD-10-CM | POA: Diagnosis not present

## 2018-12-06 ENCOUNTER — Ambulatory Visit (INDEPENDENT_AMBULATORY_CARE_PROVIDER_SITE_OTHER): Payer: Medicare Other | Admitting: Physician Assistant

## 2018-12-06 ENCOUNTER — Encounter: Payer: Self-pay | Admitting: Physician Assistant

## 2018-12-06 VITALS — BP 163/81 | HR 96 | Temp 97.6°F | Resp 16 | Wt 193.0 lb

## 2018-12-06 DIAGNOSIS — I639 Cerebral infarction, unspecified: Secondary | ICD-10-CM | POA: Diagnosis not present

## 2018-12-06 DIAGNOSIS — R5383 Other fatigue: Secondary | ICD-10-CM | POA: Diagnosis not present

## 2018-12-06 DIAGNOSIS — E1042 Type 1 diabetes mellitus with diabetic polyneuropathy: Secondary | ICD-10-CM | POA: Diagnosis not present

## 2018-12-06 DIAGNOSIS — Z780 Asymptomatic menopausal state: Secondary | ICD-10-CM | POA: Diagnosis not present

## 2018-12-06 LAB — POCT GLYCOSYLATED HEMOGLOBIN (HGB A1C)
Est. average glucose Bld gHb Est-mCnc: 192
Hemoglobin A1C: 8.3 % — AB (ref 4.0–5.6)

## 2018-12-06 NOTE — Progress Notes (Signed)
Patient: Karla Patton Female    DOB: Mar 19, 1951   67 y.o.   MRN: [id number] Visit Date: 12/06/2018  Today's Provider: Mar Daring, PA-C   Chief Complaint  Patient presents with  . Diabetes   Subjective:     HPI  Diabetes Mellitus Type II, Follow-up:   Lab Results  Component Value Date   HGBA1C 8.3 (A) 12/06/2018   HGBA1C 8.1 (A) 09/01/2018   HGBA1C 8.9 (H) 05/31/2018    Last seen for diabetes 3 months ago.  Management since then includes no changes. She reports excellent compliance with treatment. She is not having side effects. Current symptoms include none and have been stable. Reports intense fatigue worsening over the last month. No desire to do anything. Not even excited about christmas. Home blood sugar records: fasting range: 90's  Episodes of hypoglycemia? no   Current Insulin Regimen:  Most Recent Eye Exam: utd Weight trend: stable Prior visit with dietician: no Current diet: in general, a "healthy" diet   Current exercise: none  Pertinent Labs:    Component Value Date/Time   CHOL 183 05/31/2018 1145   TRIG 105 05/31/2018 1145   HDL 66 05/31/2018 1145   LDLCALC 96 05/31/2018 1145   CREATININE 0.70 05/31/2018 1145    Wt Readings from Last 3 Encounters:  12/06/18 193 lb (87.5 kg)  10/20/18 193 lb (87.5 kg)  10/05/18 193 lb 9.6 oz (87.8 kg)   ------------------------------------------------------------------------   No Known Allergies   Current Outpatient Medications:  .  ALPRAZolam (XANAX) 0.5 MG tablet, TAKE 1 TABLET(0.5 MG) BY MOUTH TWICE DAILY, Disp: 60 tablet, Rfl: 5 .  aspirin EC 81 MG tablet, Take 81 mg by mouth daily., Disp: , Rfl:  .  CONTOUR NEXT TEST test strip, CHECK BLOOD SUGAR BEFORE MEALS AND AT BEDTIME, Disp: 400 each, Rfl: 3 .  insulin aspart (NOVOLOG FLEXPEN) 100 UNIT/ML FlexPen, Inject SQ 1 Unit Novolog for every 15 grams of carbs eaten with each meal, Disp: 15 mL, Rfl: 5 .  Insulin Pen Needle (B-D  ULTRAFINE III SHORT PEN) 31G X 8 MM MISC, USE AS DIRECTED FOUR TIMES DAILY, BEFORE MEALS AND AT BEDTIME, Disp: 100 each, Rfl: 3 .  LANTUS SOLOSTAR 100 UNIT/ML Solostar Pen, ADMINISTER 10 UNITS UNDER THE SKIN TWICE DAILY, Disp: 15 mL, Rfl: 5 .  levothyroxine (SYNTHROID, LEVOTHROID) 100 MCG tablet, TAKE 1 TABLET BY MOUTH DAILY BEFORE BREAKFAST, Disp: 90 tablet, Rfl: 1 .  pregabalin (LYRICA) 225 MG capsule, Take 1 capsule (225 mg total) by mouth at bedtime., Disp: 90 capsule, Rfl: 1  Review of Systems  Constitutional: Positive for fatigue.  Respiratory: Negative.   Cardiovascular: Negative.   Neurological: Negative.     Social History   Tobacco Use  . Smoking status: Never Smoker  . Smokeless tobacco: Never Used  Substance Use Topics  . Alcohol use: Yes    Alcohol/week: 7.0 standard drinks    Types: 7 Shots of liquor per week    Comment: 1 airplane bottle of Fireball Whiskey every night      Objective:   BP (!) 163/81 (BP Location: Left Arm, Patient Position: Sitting, Cuff Size: Large)   Pulse 96   Temp 97.6 F (36.4 C) (Oral)   Resp 16   Wt 193 lb (87.5 kg)   BMI 32.12 kg/m  Vitals:   12/06/18 0857  BP: (!) 163/81  Pulse: 96  Resp: 16  Temp: 97.6 F (36.4 C)  TempSrc: Oral  Weight: 193 lb (87.5 kg)     Physical Exam Vitals signs reviewed.  Constitutional:      General: She is not in acute distress.    Appearance: She is well-developed. She is not diaphoretic.  Neck:     Musculoskeletal: Normal range of motion and neck supple.     Thyroid: No thyromegaly.     Vascular: No JVD.     Trachea: No tracheal deviation.  Cardiovascular:     Rate and Rhythm: Normal rate and regular rhythm.     Heart sounds: Normal heart sounds. No murmur. No friction rub. No gallop.   Pulmonary:     Effort: Pulmonary effort is normal. No respiratory distress.     Breath sounds: Normal breath sounds. No wheezing or rales.  Lymphadenopathy:     Cervical: No cervical adenopathy.          Assessment & Plan    1. Type 1 diabetes mellitus with diabetic polyneuropathy (HCC) A1c stable at 8.3. Follow up in 3 months. Continue all medications as prescribed.  - POCT HgB I5W - Basic Metabolic Panel (BMET)  2. Fatigue, unspecified type Worsening. Suspect more likely seasonal affective disorder vs depression. Will check labs as below for organic cause. Will f/u pending results. - CBC w/Diff/Platelet - TSH - Basic Metabolic Panel (BMET) - T88 - Vitamin D (25 hydroxy)  3. Postmenopausal estrogen deficiency See above medical treatment plan. - Vitamin D (25 hydroxy)     Mar Daring, PA-C  [facility]

## 2018-12-06 NOTE — Patient Instructions (Signed)

## 2018-12-07 ENCOUNTER — Telehealth: Payer: Self-pay

## 2018-12-07 DIAGNOSIS — M5137 Other intervertebral disc degeneration, lumbosacral region: Secondary | ICD-10-CM | POA: Diagnosis not present

## 2018-12-07 DIAGNOSIS — M531 Cervicobrachial syndrome: Secondary | ICD-10-CM | POA: Diagnosis not present

## 2018-12-07 DIAGNOSIS — M9903 Segmental and somatic dysfunction of lumbar region: Secondary | ICD-10-CM | POA: Diagnosis not present

## 2018-12-07 DIAGNOSIS — M9901 Segmental and somatic dysfunction of cervical region: Secondary | ICD-10-CM | POA: Diagnosis not present

## 2018-12-07 LAB — TSH: TSH: 3.7 u[IU]/mL (ref 0.450–4.500)

## 2018-12-07 LAB — CBC WITH DIFFERENTIAL/PLATELET
Basophils Absolute: 0.1 10*3/uL (ref 0.0–0.2)
Basos: 2 %
EOS (ABSOLUTE): 0.2 10*3/uL (ref 0.0–0.4)
Eos: 3 %
Hematocrit: 36.2 % (ref 34.0–46.6)
Hemoglobin: 12.2 g/dL (ref 11.1–15.9)
Immature Grans (Abs): 0 10*3/uL (ref 0.0–0.1)
Immature Granulocytes: 1 %
Lymphocytes Absolute: 1.6 10*3/uL (ref 0.7–3.1)
Lymphs: 30 %
MCH: 30 pg (ref 26.6–33.0)
MCHC: 33.7 g/dL (ref 31.5–35.7)
MCV: 89 fL (ref 79–97)
Monocytes Absolute: 0.7 10*3/uL (ref 0.1–0.9)
Monocytes: 13 %
Neutrophils Absolute: 2.7 10*3/uL (ref 1.4–7.0)
Neutrophils: 51 %
Platelets: 393 10*3/uL (ref 150–450)
RBC: 4.07 x10E6/uL (ref 3.77–5.28)
RDW: 11.9 % — ABNORMAL LOW (ref 12.3–15.4)
WBC: 5.2 10*3/uL (ref 3.4–10.8)

## 2018-12-07 LAB — BASIC METABOLIC PANEL
BUN/Creatinine Ratio: 16 (ref 12–28)
BUN: 12 mg/dL (ref 8–27)
CO2: 25 mmol/L (ref 20–29)
Calcium: 9.3 mg/dL (ref 8.7–10.3)
Chloride: 93 mmol/L — ABNORMAL LOW (ref 96–106)
Creatinine, Ser: 0.76 mg/dL (ref 0.57–1.00)
GFR calc Af Amer: 95 mL/min/{1.73_m2} (ref >59)
GFR calc non Af Amer: 82 mL/min/{1.73_m2} (ref >59)
Glucose: 224 mg/dL — ABNORMAL HIGH (ref 65–99)
Potassium: 5 mmol/L (ref 3.5–5.2)
Sodium: 131 mmol/L — ABNORMAL LOW (ref 134–144)

## 2018-12-07 LAB — VITAMIN B12: Vitamin B-12: 445 pg/mL (ref 232–1245)

## 2018-12-07 LAB — VITAMIN D 25 HYDROXY (VIT D DEFICIENCY, FRACTURES): Vit D, 25-Hydroxy: 29.3 ng/mL — ABNORMAL LOW (ref 30.0–100.0)

## 2018-12-07 NOTE — Telephone Encounter (Signed)
-----   Message from Mar Daring, [place] sent at 12/07/2018  7:38 AM EST ----- Blood count is normal. Thyroid is normal. Kidney function is normal. Random glucose was elevated at 224. B12 is normal. Vit D is borderline low. Recommend adding Vit D OTC 1000-2000 IU daily. This could cause some of the fatigue so worthwhile to see if this helps.

## 2018-12-07 NOTE — Telephone Encounter (Signed)
Patient advised as below.  

## 2018-12-13 ENCOUNTER — Telehealth: Payer: Self-pay

## 2018-12-13 DIAGNOSIS — J0140 Acute pansinusitis, unspecified: Secondary | ICD-10-CM

## 2018-12-13 MED ORDER — AMOXICILLIN-POT CLAVULANATE 875-125 MG PO TABS: 1.0000 | ORAL_TABLET | Freq: Two times a day (BID) | ORAL | 0 refills | Status: DC

## 2018-12-13 NOTE — Telephone Encounter (Signed)
Augmentin sent in.  

## 2018-12-13 NOTE — Telephone Encounter (Signed)
Please Review

## 2018-12-13 NOTE — Telephone Encounter (Signed)
Patient reports coughing, congestion and sore throat. She denies fever. No appointments available today. She is requesting a RX be sent to [facility]. CB#[phone]

## 2018-12-13 NOTE — Telephone Encounter (Signed)
Patient advised as directed below. 

## 2018-12-14 ENCOUNTER — Telehealth: Payer: Self-pay

## 2018-12-14 ENCOUNTER — Other Ambulatory Visit: Payer: Self-pay | Admitting: Physician Assistant

## 2018-12-14 DIAGNOSIS — E119 Type 2 diabetes mellitus without complications: Secondary | ICD-10-CM

## 2018-12-14 NOTE — Telephone Encounter (Signed)
Max 10 units per day.

## 2018-12-14 NOTE — Telephone Encounter (Signed)
Shu from [facility] had called office stating that she needed to speak with nurse to get clarification on a prescription for a Novolog Flex Pen. Pharmacist states that she needs to verify max daily unit for insurance purposes. She request a call back at [phone]. KW

## 2018-12-14 NOTE — Telephone Encounter (Signed)
1 unit of novolog per 15 grams of carbs per meal

## 2018-12-14 NOTE — Telephone Encounter (Signed)
NOVOLOG FLEXPEN 100 UNIT/ML FlexPen 15 mL  Refills: 5  Start: 12/14/2018  Sig: INJECT SUBCUTANEOUSLY AS PER SLIDING SCALE BEFORE EACH MEAL   Shu from [facility] is requesting a number to be able to fill and file insurance.

## 2018-12-17 ENCOUNTER — Encounter: Payer: Self-pay | Admitting: Physician Assistant

## 2018-12-17 DIAGNOSIS — E119 Type 2 diabetes mellitus without complications: Secondary | ICD-10-CM

## 2018-12-21 DIAGNOSIS — M9908 Segmental and somatic dysfunction of rib cage: Secondary | ICD-10-CM | POA: Diagnosis not present

## 2018-12-21 DIAGNOSIS — M9902 Segmental and somatic dysfunction of thoracic region: Secondary | ICD-10-CM | POA: Diagnosis not present

## 2018-12-21 MED ORDER — INSULIN ASPART 100 UNIT/ML FLEXPEN: PEN_INJECTOR | SUBCUTANEOUS | 0 refills | Status: DC

## 2018-12-23 ENCOUNTER — Other Ambulatory Visit: Payer: Self-pay

## 2018-12-23 DIAGNOSIS — E119 Type 2 diabetes mellitus without complications: Secondary | ICD-10-CM

## 2018-12-23 MED ORDER — INSULIN ASPART 100 UNIT/ML FLEXPEN: PEN_INJECTOR | SUBCUTANEOUS | 0 refills | Status: DC

## 2018-12-23 NOTE — Telephone Encounter (Signed)
Patient requesting Novolog prescription for a max of 50 units a day.

## 2019-01-21 DIAGNOSIS — M9901 Segmental and somatic dysfunction of cervical region: Secondary | ICD-10-CM | POA: Diagnosis not present

## 2019-01-21 DIAGNOSIS — M530 Cervicocranial syndrome: Secondary | ICD-10-CM | POA: Diagnosis not present

## 2019-01-28 DIAGNOSIS — M530 Cervicocranial syndrome: Secondary | ICD-10-CM | POA: Diagnosis not present

## 2019-01-28 DIAGNOSIS — M9901 Segmental and somatic dysfunction of cervical region: Secondary | ICD-10-CM | POA: Diagnosis not present

## 2019-02-03 ENCOUNTER — Other Ambulatory Visit: Payer: Self-pay | Admitting: Physician Assistant

## 2019-02-03 DIAGNOSIS — E1140 Type 2 diabetes mellitus with diabetic neuropathy, unspecified: Secondary | ICD-10-CM

## 2019-02-03 DIAGNOSIS — Z794 Long term (current) use of insulin: Principal | ICD-10-CM

## 2019-02-08 ENCOUNTER — Other Ambulatory Visit: Payer: Self-pay | Admitting: Physician Assistant

## 2019-02-08 DIAGNOSIS — Z794 Long term (current) use of insulin: Principal | ICD-10-CM

## 2019-02-08 DIAGNOSIS — E1140 Type 2 diabetes mellitus with diabetic neuropathy, unspecified: Secondary | ICD-10-CM

## 2019-02-09 ENCOUNTER — Other Ambulatory Visit: Payer: Self-pay | Admitting: Physician Assistant

## 2019-02-09 DIAGNOSIS — Z794 Long term (current) use of insulin: Secondary | ICD-10-CM

## 2019-02-09 DIAGNOSIS — E1142 Type 2 diabetes mellitus with diabetic polyneuropathy: Secondary | ICD-10-CM

## 2019-02-09 DIAGNOSIS — E1140 Type 2 diabetes mellitus with diabetic neuropathy, unspecified: Secondary | ICD-10-CM

## 2019-02-09 MED ORDER — PREGABALIN 225 MG PO CAPS: ORAL_CAPSULE | ORAL | 1 refills | Status: DC

## 2019-02-09 NOTE — Progress Notes (Signed)
Lyrica refilled

## 2019-02-14 DIAGNOSIS — M5386 Other specified dorsopathies, lumbar region: Secondary | ICD-10-CM | POA: Diagnosis not present

## 2019-02-14 DIAGNOSIS — M9903 Segmental and somatic dysfunction of lumbar region: Secondary | ICD-10-CM | POA: Diagnosis not present

## 2019-03-07 ENCOUNTER — Other Ambulatory Visit: Payer: Self-pay

## 2019-03-07 ENCOUNTER — Ambulatory Visit (INDEPENDENT_AMBULATORY_CARE_PROVIDER_SITE_OTHER): Payer: Medicare Other | Admitting: Physician Assistant

## 2019-03-07 ENCOUNTER — Encounter: Payer: Self-pay | Admitting: Physician Assistant

## 2019-03-07 VITALS — BP 147/81 | HR 75 | Temp 97.8°F | Ht 65.0 in | Wt 195.0 lb

## 2019-03-07 DIAGNOSIS — Z6832 Body mass index (BMI) 32.0-32.9, adult: Secondary | ICD-10-CM

## 2019-03-07 DIAGNOSIS — E1042 Type 1 diabetes mellitus with diabetic polyneuropathy: Secondary | ICD-10-CM

## 2019-03-07 DIAGNOSIS — E6609 Other obesity due to excess calories: Secondary | ICD-10-CM

## 2019-03-07 LAB — POCT GLYCOSYLATED HEMOGLOBIN (HGB A1C): Hemoglobin A1C: 7.9 % — AB (ref 4.0–5.6)

## 2019-03-07 LAB — POCT UA - MICROALBUMIN: Microalbumin Ur, POC: NEGATIVE mg/L

## 2019-03-07 NOTE — Patient Instructions (Signed)
Type 1 Diabetes Mellitus, Diagnosis, Adult  Type 1 diabetes (type 1 diabetes mellitus) is a long-term (chronic) disease. It happens when your pancreas does not make enough of a hormone called insulin. Insulin lets sugars (glucose) go into cells in your body. This gives you energy. If your body does not make enough insulin, sugars cannot get into cells. This causes high blood sugar (hyperglycemia). Your doctor will set treatment goals for you. Generally, you should have these blood sugar levels:  Before meals (preprandial): 80-130 mg/dL (4.4-7.2 mmol/L).  After meals (postprandial): below 180 mg/dL (10 mmol/L).  A1c (hemoglobin A1c) level: less than 7%. Follow these instructions at home: Questions to ask your doctor  You may want to ask these questions: ? Do I need to meet with a diabetes educator? ? Where can I find a support group for people with diabetes? ? What equipment will I need to care for myself at home? ? What diabetes medicines do I need? When should I take them? ? How often do I need to check my blood sugar? ? What number can I call if I have questions? ? When is my next doctor's visit? General instructions  Take over-the-counter and prescription medicines only as told by your doctor.  Keep all follow-up visits as told by your doctor. This is important. Contact a doctor if:  Your blood sugar is at or above 240 mg/dL (13.3 mmol/L) for 2 days in a row.  You have been sick for 2 days or more, and you are not getting better.  You have had a fever for 2 days or more, and you are not getting better.  You have any of these problems for more than 6 hours: ? You cannot eat or drink. ? You feel sick to your stomach (nauseous). ? You throw up (vomit). ? You have watery poop (diarrhea). Get help right away if:  Your blood sugar is lower than 54 mg/dL (3 mmol/L).  You get confused.  You have trouble: ? Thinking clearly. ? Breathing.  You have moderate or large ketone  levels in your pee (urine). Summary  Type 1 diabetes (type 1 diabetes mellitus) is a long-term disease. It happens when your pancreas does not make enough of a hormone called insulin.  Your doctor will set treatment goals for you.  Take over-the-counter and prescription medicines only as told by your doctor.  Keep all follow-up visits as told by your doctor. This is important. This information is not intended to replace advice given to you by your health care provider. Make sure you discuss any questions you have with your health care provider. Document Released: 03/31/2016 Document Revised: 07/08/2017 Document Reviewed: 01/11/2016 Elsevier Interactive Patient Education  2019 [employer].

## 2019-03-07 NOTE — Progress Notes (Signed)
Patient: Karla Patton Female    DOB: 1951-07-23   68 y.o.   MRN: [id number] Visit Date: 03/07/2019  Today's Provider: Mar Daring, PA-C   Chief Complaint  Patient presents with  . Diabetes    3 month fup   Subjective:     HPI  Diabetes Mellitus Type II, Follow-up:   Lab Results  Component Value Date   HGBA1C 7.9 (A) 03/07/2019   HGBA1C 8.3 (A) 12/06/2018   HGBA1C 8.1 (A) 09/01/2018    Last seen for diabetes 3 months ago.  Management since then includes no changes. She reports excellent compliance with treatment. She is not having side effects.  Current symptoms include none and have been stable. Home blood sugar records: 49 to 120  Episodes of hypoglycemia? yes - 33 on Sunday morning at 3am 03/06/19   Current Insulin Regimen: continue insulin (Novolog and Lantus) Most Recent Eye Exam: 07/26/2018 Weight trend: stable Prior visit with dietician: No Current exercise: walking Current diet habits: not asked  Pertinent Labs:    Component Value Date/Time   CHOL 183 05/31/2018 1145   TRIG 105 05/31/2018 1145   HDL 66 05/31/2018 1145   LDLCALC 96 05/31/2018 1145   CREATININE 0.76 12/06/2018 0950    Wt Readings from Last 3 Encounters:  03/07/19 195 lb (88.5 kg)  12/06/18 193 lb (87.5 kg)  10/20/18 193 lb (87.5 kg)   ------------------------------------------------------------------------  No Known Allergies   Current Outpatient Medications:  .  ALPRAZolam (XANAX) 0.5 MG tablet, TAKE 1 TABLET(0.5 MG) BY MOUTH TWICE DAILY, Disp: 60 tablet, Rfl: 5 .  APPLE CIDER VINEGAR PO, Take 600 mg by mouth., Disp: , Rfl:  .  aspirin EC 81 MG tablet, Take 81 mg by mouth daily., Disp: , Rfl:  .  B COMPLEX-C PO, Take by mouth., Disp: , Rfl:  .  Calcium Carb-Cholecalciferol (CALCIUM PLUS D3 ABSORBABLE PO), Take 600 mg by mouth. With magnesium 300 mg, vit D 1000 IU, Disp: , Rfl:  .  Cholecalciferol (VITAMIN D-3 PO), Take by mouth. 10,000 IU, Disp: , Rfl:  .   CoenzymeQ10-Isoleucine-Glycine (CO Q-10) 100-50-25 MG TB24, Take 1,000 mg PE/L by mouth. With cinnamon, Disp: , Rfl:  .  CONTOUR NEXT TEST test strip, CHECK BLOOD SUGAR BEFORE MEALS AND AT BEDTIME, Disp: 400 each, Rfl: 3 .  insulin aspart (NOVOLOG FLEXPEN) 100 UNIT/ML FlexPen, INJECT SUBCUTANEOUSLY AS PER SLIDING SCALE BEFORE EACH MEAL. May take up to 50 units per day., Disp: 45 mL, Rfl: 0 .  Insulin Pen Needle (B-D ULTRAFINE III SHORT PEN) 31G X 8 MM MISC, USE AS DIRECTED FOUR TIMES DAILY, BEFORE MEALS AND AT BEDTIME, Disp: 100 each, Rfl: 3 .  LANTUS SOLOSTAR 100 UNIT/ML Solostar Pen, ADMINISTER 10 UNITS UNDER THE SKIN TWICE DAILY, Disp: 15 mL, Rfl: 5 .  levothyroxine (SYNTHROID, LEVOTHROID) 100 MCG tablet, TAKE 1 TABLET BY MOUTH DAILY BEFORE BREAKFAST, Disp: 90 tablet, Rfl: 1 .  Lysine 500 MG TABS, Take by mouth., Disp: , Rfl:  .  Melatonin 2.5 MG CHEW, Chew by mouth. gummies 1 at bedtime, Disp: , Rfl:  .  pregabalin (LYRICA) 225 MG capsule, TAKE 1 CAPSULE(225 MG) BY MOUTH AT BEDTIME, Disp: 90 capsule, Rfl: 1 .  amoxicillin-clavulanate (AUGMENTIN) 875-125 MG tablet, Take 1 tablet by mouth 2 (two) times daily. (Patient not taking: Reported on 03/07/2019), Disp: 20 tablet, Rfl: 0  Review of Systems  Constitutional: Negative.   Respiratory: Negative.   Cardiovascular: Negative.  Gastrointestinal: Negative.   Endocrine: Negative.   Neurological: Negative.     Social History   Tobacco Use  . Smoking status: Never Smoker  . Smokeless tobacco: Never Used  Substance Use Topics  . Alcohol use: Yes    Alcohol/week: 7.0 standard drinks    Types: 7 Shots of liquor per week    Comment: 1 airplane bottle of Fireball Whiskey every night      Objective:   BP (!) 147/81 (BP Location: Left Arm, Patient Position: Sitting, Cuff Size: Large)   Pulse 75   Temp 97.8 F (36.6 C) (Oral)   Ht 5\' 5"  (1.651 m)   Wt 195 lb (88.5 kg)   SpO2 98%   BMI 32.45 kg/m  Vitals:   03/07/19 1139  BP: (!)  147/81  Pulse: 75  Temp: 97.8 F (36.6 C)  TempSrc: Oral  SpO2: 98%  Weight: 195 lb (88.5 kg)  Height: 5\' 5"  (1.651 m)     Physical Exam Vitals signs reviewed.  Constitutional:      General: She is not in acute distress.    Appearance: Normal appearance. She is well-developed. She is obese. She is not ill-appearing or diaphoretic.  Neck:     Musculoskeletal: Normal range of motion and neck supple.  Cardiovascular:     Rate and Rhythm: Normal rate and regular rhythm.     Heart sounds: Normal heart sounds. No murmur. No friction rub. No gallop.   Pulmonary:     Effort: Pulmonary effort is normal. No respiratory distress.     Breath sounds: Normal breath sounds. No wheezing or rales.  Neurological:     Mental Status: She is alert.         Assessment & Plan    1. Type 1 diabetes mellitus with diabetic polyneuropathy (HCC) A1c improved to 7.9 from 8.3. Microalbumin negative. Continue all medications as prescribed. I will see her back in 3 months for AWV/CPE. - POCT UA - Microalbumin - POCT glycosylated hemoglobin (Hb A1C)  2. Class 1 obesity due to excess calories with serious comorbidity and body mass index (BMI) of 32.0 to 32.9 in adult Counseled patient on healthy lifestyle modifications including dieting and exercise.      Mar Daring, PA-C  [facility]

## 2019-03-14 DIAGNOSIS — G44219 Episodic tension-type headache, not intractable: Secondary | ICD-10-CM | POA: Diagnosis not present

## 2019-03-14 DIAGNOSIS — M9901 Segmental and somatic dysfunction of cervical region: Secondary | ICD-10-CM | POA: Diagnosis not present

## 2019-03-17 ENCOUNTER — Telehealth: Payer: Self-pay

## 2019-03-17 NOTE — Telephone Encounter (Signed)
Patient had called office today with complaint of sore throat that she states began this morning. Patient reports that sore throat is on the left side only and it appears to her that her left lymph node is enlarged. Patient denies, fever, cough, shortness of breath, runny nose, congestion, sinus pain/pressure, ear pain, nausea, vomiting, diarrhea or body chills/sweats. Patient states that she has been helping family member clean home and has been using strong chemicals and was unsure if chemicals were the cause of sudden sore throat. I offered patient a evisit to address with physician but she states that she does not have access to web cam or computer. Please advise, this is patient of Karla Patton, patient uses [facility] in [place]

## 2019-03-17 NOTE — Telephone Encounter (Signed)
We can't prescribe her something without evaluating this.  It could be many different things.

## 2019-03-17 NOTE — Telephone Encounter (Signed)
Patient declined coming in for evaluation due to corona virus outbreak, she uses [facility] in [place] please advise. KW

## 2019-03-17 NOTE — Telephone Encounter (Signed)
It would be ok to bring her into the office to be evaluated.  She can see anyone available.

## 2019-03-18 NOTE — Telephone Encounter (Signed)
Patient advised. She states "she will just wait it out"

## 2019-03-29 DIAGNOSIS — M9903 Segmental and somatic dysfunction of lumbar region: Secondary | ICD-10-CM | POA: Diagnosis not present

## 2019-03-29 DIAGNOSIS — M531 Cervicobrachial syndrome: Secondary | ICD-10-CM | POA: Diagnosis not present

## 2019-03-29 DIAGNOSIS — M9901 Segmental and somatic dysfunction of cervical region: Secondary | ICD-10-CM | POA: Diagnosis not present

## 2019-03-29 DIAGNOSIS — M5135 Other intervertebral disc degeneration, thoracolumbar region: Secondary | ICD-10-CM | POA: Diagnosis not present

## 2019-04-08 DIAGNOSIS — M9901 Segmental and somatic dysfunction of cervical region: Secondary | ICD-10-CM | POA: Diagnosis not present

## 2019-04-08 DIAGNOSIS — M531 Cervicobrachial syndrome: Secondary | ICD-10-CM | POA: Diagnosis not present

## 2019-04-22 DIAGNOSIS — M545 Low back pain: Secondary | ICD-10-CM | POA: Diagnosis not present

## 2019-04-22 DIAGNOSIS — M9901 Segmental and somatic dysfunction of cervical region: Secondary | ICD-10-CM | POA: Diagnosis not present

## 2019-04-22 DIAGNOSIS — M9903 Segmental and somatic dysfunction of lumbar region: Secondary | ICD-10-CM | POA: Diagnosis not present

## 2019-04-22 DIAGNOSIS — M530 Cervicocranial syndrome: Secondary | ICD-10-CM | POA: Diagnosis not present

## 2019-05-03 ENCOUNTER — Other Ambulatory Visit: Payer: Self-pay | Admitting: Physician Assistant

## 2019-05-03 DIAGNOSIS — E039 Hypothyroidism, unspecified: Secondary | ICD-10-CM

## 2019-05-25 ENCOUNTER — Other Ambulatory Visit: Payer: Self-pay | Admitting: Physician Assistant

## 2019-05-25 DIAGNOSIS — Z1231 Encounter for screening mammogram for malignant neoplasm of breast: Secondary | ICD-10-CM

## 2019-05-31 DIAGNOSIS — M5137 Other intervertebral disc degeneration, lumbosacral region: Secondary | ICD-10-CM | POA: Diagnosis not present

## 2019-05-31 DIAGNOSIS — M9903 Segmental and somatic dysfunction of lumbar region: Secondary | ICD-10-CM | POA: Diagnosis not present

## 2019-05-31 DIAGNOSIS — M5136 Other intervertebral disc degeneration, lumbar region: Secondary | ICD-10-CM | POA: Diagnosis not present

## 2019-05-31 DIAGNOSIS — M9904 Segmental and somatic dysfunction of sacral region: Secondary | ICD-10-CM | POA: Diagnosis not present

## 2019-06-03 ENCOUNTER — Ambulatory Visit
Admission: RE | Admit: 2019-06-03 | Discharge: 2019-06-03 | Disposition: A | Discharge status: Home / Self Care | Payer: Medicare Other | Source: Ambulatory Visit | Attending: Physician Assistant | Admitting: Physician Assistant

## 2019-06-03 ENCOUNTER — Telehealth: Payer: Self-pay

## 2019-06-03 ENCOUNTER — Other Ambulatory Visit: Payer: Self-pay

## 2019-06-03 DIAGNOSIS — Z1231 Encounter for screening mammogram for malignant neoplasm of breast: Secondary | ICD-10-CM | POA: Diagnosis not present

## 2019-06-03 NOTE — Telephone Encounter (Signed)
Patient advised as directed below. 

## 2019-06-03 NOTE — Telephone Encounter (Signed)
-----   Message from Mar Daring, [place] sent at 06/03/2019  1:01 PM EDT ----- Normal mammogram. Repeat screening in one year.

## 2019-07-07 ENCOUNTER — Other Ambulatory Visit: Payer: Self-pay | Admitting: Physician Assistant

## 2019-07-07 DIAGNOSIS — F411 Generalized anxiety disorder: Secondary | ICD-10-CM

## 2019-07-11 ENCOUNTER — Ambulatory Visit: Payer: Medicare Other | Admitting: Physician Assistant

## 2019-07-25 ENCOUNTER — Other Ambulatory Visit: Payer: Self-pay

## 2019-07-25 ENCOUNTER — Ambulatory Visit (INDEPENDENT_AMBULATORY_CARE_PROVIDER_SITE_OTHER): Payer: Medicare Other | Admitting: Physician Assistant

## 2019-07-25 ENCOUNTER — Encounter: Payer: Self-pay | Admitting: Physician Assistant

## 2019-07-25 VITALS — BP 148/78 | HR 68 | Temp 97.9°F | Resp 16 | Wt 195.2 lb

## 2019-07-25 DIAGNOSIS — R600 Localized edema: Secondary | ICD-10-CM

## 2019-07-25 DIAGNOSIS — E1042 Type 1 diabetes mellitus with diabetic polyneuropathy: Secondary | ICD-10-CM | POA: Diagnosis not present

## 2019-07-25 LAB — POCT GLYCOSYLATED HEMOGLOBIN (HGB A1C)
Est. average glucose Bld gHb Est-mCnc: 194
Hemoglobin A1C: 8.4 % — AB (ref 4.0–5.6)

## 2019-07-25 MED ORDER — FREESTYLE LIBRE 14 DAY SENSOR MISC: 1.0000 | Freq: Three times a day (TID) | 12 refills | Status: DC

## 2019-07-25 MED ORDER — POTASSIUM CHLORIDE CRYS ER 20 MEQ PO TBCR: 20.0000 meq | EXTENDED_RELEASE_TABLET | Freq: Every day | ORAL | 3 refills | Status: DC

## 2019-07-25 MED ORDER — FREESTYLE LIBRE 14 DAY READER DEVI: 1.0000 | Freq: Three times a day (TID) | 12 refills | Status: DC

## 2019-07-25 MED ORDER — FUROSEMIDE 20 MG PO TABS: 20.0000 mg | ORAL_TABLET | Freq: Every day | ORAL | 3 refills | Status: DC

## 2019-07-25 NOTE — Patient Instructions (Addendum)

## 2019-07-25 NOTE — Progress Notes (Signed)
Patient: Karla Patton Female    DOB: [birth year]   68 y.o.   MRN: [id number] Visit Date: 07/25/2019  Today's Provider: Mar Daring, PA-C   Chief Complaint  Patient presents with  . Follow-up    T1DM   Subjective:     HPI  Diabetes Mellitus Type I, Follow-up: Patient here for follow-up Type 1 diabetes mellitus.   Her clinical course has been stable. Insulin dosage review with [place] suggested compliance most of the time. Associated symptoms of hyperglycemia have been none.  Associated symptoms of hypoglycemia have been sweating. Reports that her number was at 41.This happened 3 weeks ago on a Saturday morning.  Lab Results  Component Value Date   HGBA1C 8.4 (A) 07/25/2019    She is currently taking Novolog 1(for every 15 carb) units and Lantus 22 at night units. Insulin injections are given by patient.   Compliance with blood glucose monitoring: excellent.  Rotation of sites for injection: abdominal wall and thigh(s) Exercise: never  Meal panning: She is using try to count less carbs.              No Known Allergies   Current Outpatient Medications:  .  ALPRAZolam (XANAX) 0.5 MG tablet, TAKE 1 TABLET(0.5 MG) BY MOUTH TWICE DAILY, Disp: 60 tablet, Rfl: 5 .  APPLE CIDER VINEGAR PO, Take 600 mg by mouth., Disp: , Rfl:  .  aspirin EC 81 MG tablet, Take 81 mg by mouth daily., Disp: , Rfl:  .  B COMPLEX-C PO, Take by mouth., Disp: , Rfl:  .  Calcium Carb-Cholecalciferol (CALCIUM PLUS D3 ABSORBABLE PO), Take 600 mg by mouth. With magnesium 300 mg, vit D 1000 IU, Disp: , Rfl:  .  Cholecalciferol (VITAMIN D-3 PO), Take by mouth. 10,000 IU, Disp: , Rfl:  .  CoenzymeQ10-Isoleucine-Glycine (CO Q-10) 100-50-25 MG TB24, Take 1,000 mg PE/L by mouth. With cinnamon, Disp: , Rfl:  .  CONTOUR NEXT TEST test strip, CHECK BLOOD SUGAR BEFORE MEALS AND AT BEDTIME, Disp: 400 each, Rfl: 3 .  insulin aspart (NOVOLOG FLEXPEN) 100 UNIT/ML FlexPen, INJECT SUBCUTANEOUSLY AS PER SLIDING  SCALE BEFORE EACH MEAL. May take up to 50 units per day., Disp: 45 mL, Rfl: 0 .  Insulin Pen Needle (B-D ULTRAFINE III SHORT PEN) 31G X 8 MM MISC, USE AS DIRECTED FOUR TIMES DAILY, BEFORE MEALS AND AT BEDTIME, Disp: 100 each, Rfl: 3 .  LANTUS SOLOSTAR 100 UNIT/ML Solostar Pen, ADMINISTER 10 UNITS UNDER THE SKIN TWICE DAILY, Disp: 15 mL, Rfl: 5 .  levothyroxine (SYNTHROID) 100 MCG tablet, TAKE 1 TABLET BY MOUTH DAILY BEFORE BREAKFAST, Disp: 90 tablet, Rfl: 1 .  Lysine 500 MG TABS, Take by mouth., Disp: , Rfl:  .  Melatonin 2.5 MG CHEW, Chew by mouth. gummies 1 at bedtime, Disp: , Rfl:  .  pregabalin (LYRICA) 225 MG capsule, TAKE 1 CAPSULE(225 MG) BY MOUTH AT BEDTIME, Disp: 90 capsule, Rfl: 1  Review of Systems  Constitutional: Negative for appetite change, chills, fatigue and fever.  Respiratory: Negative for chest tightness and shortness of breath.   Cardiovascular: Negative for chest pain and palpitations.  Gastrointestinal: Negative for abdominal pain, nausea and vomiting.  Musculoskeletal: Positive for back pain.  Neurological: Positive for numbness. Negative for dizziness and weakness.    Social History   Tobacco Use  . Smoking status: Never Smoker  . Smokeless tobacco: Never Used  Substance Use Topics  . Alcohol use: Yes  Alcohol/week: 7.0 standard drinks    Types: 7 Shots of liquor per week    Comment: 1 airplane bottle of Fireball Whiskey every night      Objective:   BP (!) 148/78 (BP Location: Left Arm, Patient Position: Sitting, Cuff Size: Large)   Pulse 68   Temp 97.9 F (36.6 C) (Oral)   Resp 16   Wt 195 lb 3.2 oz (88.5 kg)   SpO2 98%   BMI 32.48 kg/m  Vitals:   07/25/19 0953  BP: (!) 148/78  Pulse: 68  Resp: 16  Temp: 97.9 F (36.6 C)  TempSrc: Oral  SpO2: 98%  Weight: 195 lb 3.2 oz (88.5 kg)     Physical Exam Vitals signs reviewed.  Constitutional:      General: She is not in acute distress.    Appearance: Normal appearance. She is  well-developed. She is obese. She is not ill-appearing or diaphoretic.  HENT:     Head: Normocephalic and atraumatic.  Neck:     Musculoskeletal: Normal range of motion and neck supple.  Cardiovascular:     Rate and Rhythm: Normal rate and regular rhythm.     Heart sounds: Normal heart sounds. No murmur. No friction rub. No gallop.   Pulmonary:     Effort: Pulmonary effort is normal. No respiratory distress.     Breath sounds: Normal breath sounds. No wheezing or rales.  Neurological:     Mental Status: She is alert.    Diabetic Foot Exam - Simple   Simple Foot Form Diabetic Foot exam was performed with the following findings: Yes 07/25/2019 12:54 PM  Visual Inspection No deformities, no ulcerations, no other skin breakdown bilaterally: Yes Sensation Testing See comments: Yes Pulse Check Posterior Tibialis and Dorsalis pulse intact bilaterally: Yes Comments No sensation of filament until lower extremities, can feel light touch with hands      Results for orders placed or performed in visit on 07/25/19  POCT glycosylated hemoglobin (Hb A1C)  Result Value Ref Range   Hemoglobin A1C 8.4 (A) 4.0 - 5.6 %   Est. average glucose Bld gHb Est-mCnc 194        Assessment & Plan    1. Type 1 diabetes mellitus with diabetic polyneuropathy ([place]) Will attempt to get [facility] covered for patient as below. Patient still uncontrolled. A1c today was 8.4, up from 7.9. Admits to still missing some Bolus injections with her meals. Also continues to drink alcohol, at least 1-2 drinks per day. Long discussion over lifestyle modifications and medication compliance to avoid her highs and lows. I will see her back in 3 months.  - Continuous Blood Gluc Receiver (FREESTYLE LIBRE 14 DAY READER) DEVI; 1 Device by Does not apply route 4 (four) times daily -  before meals and at bedtime.  Dispense: 1 Device; Refill: 12 - Continuous Blood Gluc Sensor (FREESTYLE LIBRE 14 DAY SENSOR) MISC; 1 Device by  Does not apply route 4 (four) times daily -  before meals and at bedtime.  Dispense: 6 each; Refill: 12  2. Bilateral lower extremity edema Worsening. Discussed referral to vein clinic but patient does not wish to do at this time. Will use furosemide and potassium as below for prn use. Also discussed compression stockings. Patient will get some off [employer]. Return in 3 months.  - furosemide (LASIX) 20 MG tablet; Take 1 tablet (20 mg total) by mouth daily.  Dispense: 30 tablet; Refill: 3 - potassium chloride SA (K-DUR) 20 MEQ tablet; Take  1 tablet (20 mEq total) by mouth daily. Take with furosemide (lasix) when needed  Dispense: 30 tablet; Refill: 3   I spent approximately 40 minutes with the patient today. Over 50% of this time was spent with counseling and educating the patient.  Mar Daring, PA-C  [facility]

## 2019-08-12 DIAGNOSIS — M9901 Segmental and somatic dysfunction of cervical region: Secondary | ICD-10-CM | POA: Diagnosis not present

## 2019-08-12 DIAGNOSIS — M5136 Other intervertebral disc degeneration, lumbar region: Secondary | ICD-10-CM | POA: Diagnosis not present

## 2019-08-12 DIAGNOSIS — M531 Cervicobrachial syndrome: Secondary | ICD-10-CM | POA: Diagnosis not present

## 2019-08-12 DIAGNOSIS — M9903 Segmental and somatic dysfunction of lumbar region: Secondary | ICD-10-CM | POA: Diagnosis not present

## 2019-08-13 ENCOUNTER — Other Ambulatory Visit: Payer: Self-pay | Admitting: Physician Assistant

## 2019-08-13 DIAGNOSIS — Z794 Long term (current) use of insulin: Secondary | ICD-10-CM

## 2019-08-13 DIAGNOSIS — E1140 Type 2 diabetes mellitus with diabetic neuropathy, unspecified: Secondary | ICD-10-CM

## 2019-08-17 DIAGNOSIS — H2513 Age-related nuclear cataract, bilateral: Secondary | ICD-10-CM | POA: Diagnosis not present

## 2019-08-17 LAB — HM DIABETES EYE EXAM

## 2019-08-18 ENCOUNTER — Telehealth: Payer: Self-pay

## 2019-08-18 DIAGNOSIS — E1042 Type 1 diabetes mellitus with diabetic polyneuropathy: Secondary | ICD-10-CM

## 2019-08-18 NOTE — Telephone Encounter (Signed)
Yes these were sent in to [facility] on 07/25/19

## 2019-08-18 NOTE — Telephone Encounter (Signed)
Patient called to check if Karla Patton received a request from [place]  for a freestyle Libre 14 day meter? Patient would like this meter and wants to get it through this medical device company.

## 2019-08-19 NOTE — Telephone Encounter (Signed)
Patient called office stating that [facility] still does not have prescription. She is requesting this be called in today. KW

## 2019-08-22 ENCOUNTER — Encounter: Payer: Self-pay | Admitting: Physician Assistant

## 2019-08-22 MED ORDER — FREESTYLE LIBRE 14 DAY SENSOR MISC: 1.0000 | Freq: Three times a day (TID) | 12 refills | Status: DC

## 2019-08-22 MED ORDER — FREESTYLE LIBRE 14 DAY READER DEVI: 1.0000 | Freq: Three times a day (TID) | 12 refills | Status: DC

## 2019-08-22 NOTE — Telephone Encounter (Signed)
Sent again

## 2019-08-22 NOTE — Addendum Note (Signed)
Addended by: Mar Daring on: 08/22/2019 10:22 AM   Modules accepted: Orders

## 2019-08-26 ENCOUNTER — Encounter: Payer: Self-pay | Admitting: Physician Assistant

## 2019-09-12 DIAGNOSIS — D2261 Melanocytic nevi of right upper limb, including shoulder: Secondary | ICD-10-CM | POA: Diagnosis not present

## 2019-09-12 DIAGNOSIS — L821 Other seborrheic keratosis: Secondary | ICD-10-CM | POA: Diagnosis not present

## 2019-09-12 DIAGNOSIS — L570 Actinic keratosis: Secondary | ICD-10-CM | POA: Diagnosis not present

## 2019-09-12 DIAGNOSIS — D225 Melanocytic nevi of trunk: Secondary | ICD-10-CM | POA: Diagnosis not present

## 2019-09-12 DIAGNOSIS — L728 Other follicular cysts of the skin and subcutaneous tissue: Secondary | ICD-10-CM | POA: Diagnosis not present

## 2019-09-12 DIAGNOSIS — S50861A Insect bite (nonvenomous) of right forearm, initial encounter: Secondary | ICD-10-CM | POA: Diagnosis not present

## 2019-09-12 DIAGNOSIS — D2272 Melanocytic nevi of left lower limb, including hip: Secondary | ICD-10-CM | POA: Diagnosis not present

## 2019-09-12 DIAGNOSIS — D2262 Melanocytic nevi of left upper limb, including shoulder: Secondary | ICD-10-CM | POA: Diagnosis not present

## 2019-09-12 DIAGNOSIS — L720 Epidermal cyst: Secondary | ICD-10-CM | POA: Diagnosis not present

## 2019-09-12 DIAGNOSIS — L538 Other specified erythematous conditions: Secondary | ICD-10-CM | POA: Diagnosis not present

## 2019-09-12 DIAGNOSIS — D2271 Melanocytic nevi of right lower limb, including hip: Secondary | ICD-10-CM | POA: Diagnosis not present

## 2019-09-12 DIAGNOSIS — S50862A Insect bite (nonvenomous) of left forearm, initial encounter: Secondary | ICD-10-CM | POA: Diagnosis not present

## 2019-09-16 DIAGNOSIS — M5136 Other intervertebral disc degeneration, lumbar region: Secondary | ICD-10-CM | POA: Diagnosis not present

## 2019-09-16 DIAGNOSIS — M531 Cervicobrachial syndrome: Secondary | ICD-10-CM | POA: Diagnosis not present

## 2019-09-16 DIAGNOSIS — M9903 Segmental and somatic dysfunction of lumbar region: Secondary | ICD-10-CM | POA: Diagnosis not present

## 2019-09-16 DIAGNOSIS — M9901 Segmental and somatic dysfunction of cervical region: Secondary | ICD-10-CM | POA: Diagnosis not present

## 2019-09-20 ENCOUNTER — Encounter: Payer: Self-pay | Admitting: Physician Assistant

## 2019-09-20 DIAGNOSIS — E1042 Type 1 diabetes mellitus with diabetic polyneuropathy: Secondary | ICD-10-CM

## 2019-09-21 DIAGNOSIS — Z23 Encounter for immunization: Secondary | ICD-10-CM | POA: Diagnosis not present

## 2019-09-21 MED ORDER — FREESTYLE LIBRE 14 DAY SENSOR MISC: 1.0000 | Freq: Three times a day (TID) | 12 refills | Status: DC

## 2019-09-21 MED ORDER — FREESTYLE LIBRE 14 DAY READER DEVI: 1.0000 | Freq: Three times a day (TID) | 12 refills | Status: DC

## 2019-10-18 NOTE — Progress Notes (Signed)
I, Bretlyn Ward ,CMA am acting as a Education administrator for [employer] PA-C    Patient: Karla Patton Female    DOB: March 12, 1951   68 y.o.   MRN: [id number] Visit Date: 11/07/2019  Today's Provider: Mar Daring, PA-C   Chief Complaint  Patient presents with  . Diabetes  . Edema   Subjective:     HPI  Diabetes Mellitus Type I, Follow-up: Patient here for follow-up evaluation of Type 1 diabetes mellitus.  Her clinical course has been stable. Insulin dosage review with [place] suggested compliance all of the time. Associated symptoms of hyperglycemia have been none.  Associated symptoms of hypoglycemia have been sudden moodiness or behavior changes. She had an episode yesterday that dropped down to 47.  She is currently taking  12 units Lantus pre-breakfast, units pre-dinner, and Lantus 10 units units at bedtime.  Insulin injections are given by patient.   Compliance with blood glucose monitoring: good.  The patient does perform independently. Rotation of sites for injection: abdominal wall, arms and thighs. Exercise: rarely   Edema: She is here for 3 month follow-up. Last office visit patient was referral was discussed to vein clinic and also discussed compression stockings. The Compression hose she has been using she feels that it has been helping but would like a referral to [facility] with Dr. Lucky Cowboy at [facility].    No Known Allergies   Current Outpatient Medications:  .  ALPRAZolam (XANAX) 0.5 MG tablet, TAKE 1 TABLET(0.5 MG) BY MOUTH TWICE DAILY, Disp: 60 tablet, Rfl: 5 .  APPLE CIDER VINEGAR PO, Take 600 mg by mouth., Disp: , Rfl:  .  aspirin EC 81 MG tablet, Take 81 mg by mouth daily., Disp: , Rfl:  .  B COMPLEX-C PO, Take by mouth., Disp: , Rfl:  .  Calcium Carb-Cholecalciferol (CALCIUM PLUS D3 ABSORBABLE PO), Take 600 mg by mouth. With magnesium 300 mg, vit D 1000 IU, Disp: , Rfl:  .  Cholecalciferol (VITAMIN D-3 PO), Take by mouth. 10,000 IU, Disp: , Rfl:  .   clobetasol cream (TEMOVATE) 0.05 %, , Disp: , Rfl:  .  CoenzymeQ10-Isoleucine-Glycine (CO Q-10) 100-50-25 MG TB24, Take 1,000 mg PE/L by mouth. With cinnamon, Disp: , Rfl:  .  Continuous Blood Gluc Receiver (FREESTYLE LIBRE 14 DAY READER) DEVI, 1 Device by Does not apply route 4 (four) times daily -  before meals and at bedtime., Disp: 1 Device, Rfl: 12 .  Continuous Blood Gluc Sensor (FREESTYLE LIBRE 14 DAY SENSOR) MISC, 1 Device by Does not apply route 4 (four) times daily -  before meals and at bedtime., Disp: 6 each, Rfl: 12 .  CONTOUR NEXT TEST test strip, CHECK BLOOD SUGAR BEFORE MEALS AND AT BEDTIME, Disp: 400 each, Rfl: 3 .  furosemide (LASIX) 20 MG tablet, Take 1 tablet (20 mg total) by mouth daily., Disp: 30 tablet, Rfl: 3 .  insulin aspart (NOVOLOG FLEXPEN) 100 UNIT/ML FlexPen, INJECT SUBCUTANEOUSLY AS PER SLIDING SCALE BEFORE EACH MEAL. May take up to 50 units per day., Disp: 45 mL, Rfl: 0 .  Insulin Pen Needle (B-D ULTRAFINE III SHORT PEN) 31G X 8 MM MISC, USE AS DIRECTED FOUR TIMES DAILY, BEFORE MEALS AND AT BEDTIME, Disp: 100 each, Rfl: 3 .  LANTUS SOLOSTAR 100 UNIT/ML Solostar Pen, ADMINISTER 10 UNITS UNDER THE SKIN TWICE DAILY, Disp: 15 mL, Rfl: 5 .  levothyroxine (SYNTHROID) 100 MCG tablet, TAKE 1 TABLET BY MOUTH DAILY BEFORE BREAKFAST, Disp: 90 tablet, Rfl: 0 .  Lysine 500 MG TABS, Take by mouth., Disp: , Rfl:  .  Melatonin 2.5 MG CHEW, Chew by mouth. gummies 1 at bedtime, Disp: , Rfl:  .  potassium chloride SA (K-DUR) 20 MEQ tablet, Take 1 tablet (20 mEq total) by mouth daily. Take with furosemide (lasix) when needed, Disp: 30 tablet, Rfl: 3 .  pregabalin (LYRICA) 225 MG capsule, TAKE 1 CAPSULE(225 MG) BY MOUTH AT BEDTIME, Disp: 90 capsule, Rfl: 1  Review of Systems  Constitutional: Negative.   Respiratory: Negative.   Cardiovascular: Negative.   Gastrointestinal: Negative.   Neurological: Positive for numbness.    Social History   Tobacco Use  . Smoking status: Never  Smoker  . Smokeless tobacco: Never Used  Substance Use Topics  . Alcohol use: Yes    Alcohol/week: 7.0 standard drinks    Types: 7 Shots of liquor per week    Comment: 1 airplane bottle of Fireball Whiskey every night      Objective:   BP (!) 154/90   Pulse 74   Temp (!) 97.1 F (36.2 C)   Ht 5\' 5"  (1.651 m)   Wt 196 lb (88.9 kg)   BMI 32.62 kg/m  Vitals:   10/19/19 1509  BP: (!) 154/90  Pulse: 74  Temp: (!) 97.1 F (36.2 C)  Weight: 196 lb (88.9 kg)  Height: 5\' 5"  (1.651 m)  Body mass index is 32.62 kg/m.  Physical Exam Vitals signs reviewed.  Constitutional:      General: She is not in acute distress.    Appearance: Normal appearance. She is well-developed. She is obese. She is not ill-appearing or diaphoretic.  Neck:     Musculoskeletal: Normal range of motion and neck supple.  Cardiovascular:     Rate and Rhythm: Normal rate and regular rhythm.     Pulses: Normal pulses.     Heart sounds: Normal heart sounds. No murmur. No friction rub. No gallop.   Pulmonary:     Effort: Pulmonary effort is normal. No respiratory distress.     Breath sounds: Normal breath sounds. No wheezing or rales.  Neurological:     Mental Status: She is alert.      Results for orders placed or performed in visit on 10/19/19  POCT glycosylated hemoglobin (Hb A1C)  Result Value Ref Range   Hemoglobin A1C 7.7 (A) 4.0 - 5.6 %   Est. average glucose Bld gHb Est-mCnc 174        Assessment & Plan    1. Type 1 diabetes mellitus with diabetic polyneuropathy (HCC) A1c slightly improved today. Continue Lantus 10 units BID and Novolog prn with meals.  - Pneumococcal polysaccharide vaccine 23-valent greater than or equal to 2yo subcutaneous/IM  2. Varicose veins of both legs with edema Continuing to progress despite conservative management with compression stockings and elevating legs. Referral placed to vascular surgery for further evaluation and treatment options.  - Ambulatory  referral to Vascular Surgery  3. Varicose veins of bilateral lower extremities with pain See above medical treatment plan. - Ambulatory referral to Vascular Surgery  4. Need for pneumococcal vaccination Pneumococcal Vaccine given to patient without complications. Patient sat for 15 minutes after administration and was tolerated well without adverse effects. - Pneumococcal polysaccharide vaccine 23-valent greater than or equal to 2yo subcutaneous/IM  5. Class 1 obesity due to excess calories with serious comorbidity and body mass index (BMI) of 32.0 to 32.9 in adult Counseled patient on healthy lifestyle modifications including dieting and exercise.  Mar Daring, PA-C  [facility]

## 2019-10-19 ENCOUNTER — Ambulatory Visit (INDEPENDENT_AMBULATORY_CARE_PROVIDER_SITE_OTHER): Payer: Medicare Other | Admitting: Physician Assistant

## 2019-10-19 ENCOUNTER — Other Ambulatory Visit: Payer: Self-pay

## 2019-10-19 VITALS — BP 154/90 | HR 74 | Temp 97.1°F | Ht 65.0 in | Wt 196.0 lb

## 2019-10-19 DIAGNOSIS — Z23 Encounter for immunization: Secondary | ICD-10-CM

## 2019-10-19 DIAGNOSIS — I83813 Varicose veins of bilateral lower extremities with pain: Secondary | ICD-10-CM

## 2019-10-19 DIAGNOSIS — E1042 Type 1 diabetes mellitus with diabetic polyneuropathy: Secondary | ICD-10-CM

## 2019-10-19 DIAGNOSIS — Z6832 Body mass index (BMI) 32.0-32.9, adult: Secondary | ICD-10-CM | POA: Diagnosis not present

## 2019-10-19 DIAGNOSIS — I83893 Varicose veins of bilateral lower extremities with other complications: Secondary | ICD-10-CM

## 2019-10-19 DIAGNOSIS — E6609 Other obesity due to excess calories: Secondary | ICD-10-CM | POA: Diagnosis not present

## 2019-10-19 LAB — POCT GLYCOSYLATED HEMOGLOBIN (HGB A1C)
Est. average glucose Bld gHb Est-mCnc: 174
Hemoglobin A1C: 7.7 % — AB (ref 4.0–5.6)

## 2019-10-19 NOTE — Patient Instructions (Signed)
Diabetes Mellitus and Exercise Exercising regularly is important for your overall health, especially when you have diabetes (diabetes mellitus). Exercising is not only about losing weight. It has many other health benefits, such as increasing muscle strength and bone density and reducing body fat and stress. This leads to improved fitness, flexibility, and endurance, all of which result in better overall health. Exercise has additional benefits for people with diabetes, including:  Reducing appetite.  Helping to lower and control blood glucose.  Lowering blood pressure.  Helping to control amounts of fatty substances (lipids) in the blood, such as cholesterol and triglycerides.  Helping the body to respond better to insulin (improving insulin sensitivity).  Reducing how much insulin the body needs.  Decreasing the risk for heart disease by: ? Lowering cholesterol and triglyceride levels. ? Increasing the levels of good cholesterol. ? Lowering blood glucose levels. What is my activity plan? Your health care provider or certified diabetes educator can help you make a plan for the type and frequency of exercise (activity plan) that works for you. Make sure that you:  Do at least 150 minutes of moderate-intensity or vigorous-intensity exercise each week. This could be brisk walking, biking, or water aerobics. ? Do stretching and strength exercises, such as yoga or weightlifting, at least 2 times a week. ? Spread out your activity over at least 3 days of the week.  Get some form of physical activity every day. ? Do not go more than 2 days in a row without some kind of physical activity. ? Avoid being inactive for more than 30 minutes at a time. Take frequent breaks to walk or stretch.  Choose a type of exercise or activity that you enjoy, and set realistic goals.  Start slowly, and gradually increase the intensity of your exercise over time. What do I need to know about managing my  diabetes?   Check your blood glucose before and after exercising. ? If your blood glucose is 240 mg/dL (13.3 mmol/L) or higher before you exercise, check your urine for ketones. If you have ketones in your urine, do not exercise until your blood glucose returns to normal. ? If your blood glucose is 100 mg/dL (5.6 mmol/L) or lower, eat a snack containing 15-20 grams of carbohydrate. Check your blood glucose 15 minutes after the snack to make sure that your level is above 100 mg/dL (5.6 mmol/L) before you start your exercise.  Know the symptoms of low blood glucose (hypoglycemia) and how to treat it. Your risk for hypoglycemia increases during and after exercise. Common symptoms of hypoglycemia can include: ? Hunger. ? Anxiety. ? Sweating and feeling clammy. ? Confusion. ? Dizziness or feeling light-headed. ? Increased heart rate or palpitations. ? Blurry vision. ? Tingling or numbness around the mouth, lips, or tongue. ? Tremors or shakes. ? Irritability.  Keep a rapid-acting carbohydrate snack available before, during, and after exercise to help prevent or treat hypoglycemia.  Avoid injecting insulin into areas of the body that are going to be exercised. For example, avoid injecting insulin into: ? The arms, when playing tennis. ? The legs, when jogging.  Keep records of your exercise habits. Doing this can help you and your health care provider adjust your diabetes management plan as needed. Write down: ? Food that you eat before and after you exercise. ? Blood glucose levels before and after you exercise. ? The type and amount of exercise you have done. ? When your insulin is expected to peak, if you use   insulin. Avoid exercising at times when your insulin is peaking.  When you start a new exercise or activity, work with your health care provider to make sure the activity is safe for you, and to adjust your insulin, medicines, or food intake as needed.  Drink plenty of water while  you exercise to prevent dehydration or heat stroke. Drink enough fluid to keep your urine clear or pale yellow. Summary  Exercising regularly is important for your overall health, especially when you have diabetes (diabetes mellitus).  Exercising has many health benefits, such as increasing muscle strength and bone density and reducing body fat and stress.  Your health care provider or certified diabetes educator can help you make a plan for the type and frequency of exercise (activity plan) that works for you.  When you start a new exercise or activity, work with your health care provider to make sure the activity is safe for you, and to adjust your insulin, medicines, or food intake as needed. This information is not intended to replace advice given to you by your health care provider. Make sure you discuss any questions you have with your health care provider. Document Released: 02/28/2004 Document Revised: 07/02/2017 Document Reviewed: 05/19/2016 Elsevier Patient Education  2020 [employer]  

## 2019-10-25 ENCOUNTER — Other Ambulatory Visit (INDEPENDENT_AMBULATORY_CARE_PROVIDER_SITE_OTHER): Payer: Self-pay | Admitting: Nurse Practitioner

## 2019-10-25 DIAGNOSIS — I83893 Varicose veins of bilateral lower extremities with other complications: Secondary | ICD-10-CM

## 2019-10-25 DIAGNOSIS — I83813 Varicose veins of bilateral lower extremities with pain: Secondary | ICD-10-CM

## 2019-10-26 ENCOUNTER — Ambulatory Visit: Payer: Self-pay | Admitting: Physician Assistant

## 2019-10-27 ENCOUNTER — Ambulatory Visit (INDEPENDENT_AMBULATORY_CARE_PROVIDER_SITE_OTHER): Payer: Medicare Other | Admitting: Nurse Practitioner

## 2019-10-27 ENCOUNTER — Ambulatory Visit (INDEPENDENT_AMBULATORY_CARE_PROVIDER_SITE_OTHER): Payer: Medicare Other

## 2019-10-27 ENCOUNTER — Other Ambulatory Visit: Payer: Self-pay

## 2019-10-27 ENCOUNTER — Encounter (INDEPENDENT_AMBULATORY_CARE_PROVIDER_SITE_OTHER): Payer: Self-pay | Admitting: Nurse Practitioner

## 2019-10-27 VITALS — BP 151/82 | HR 75 | Resp 16 | Wt 195.8 lb

## 2019-10-27 DIAGNOSIS — E7800 Pure hypercholesterolemia, unspecified: Secondary | ICD-10-CM | POA: Diagnosis not present

## 2019-10-27 DIAGNOSIS — I83893 Varicose veins of bilateral lower extremities with other complications: Secondary | ICD-10-CM | POA: Diagnosis not present

## 2019-10-27 DIAGNOSIS — I10 Essential (primary) hypertension: Secondary | ICD-10-CM

## 2019-10-27 DIAGNOSIS — I83813 Varicose veins of bilateral lower extremities with pain: Secondary | ICD-10-CM | POA: Diagnosis not present

## 2019-10-27 DIAGNOSIS — I890 Lymphedema, not elsewhere classified: Secondary | ICD-10-CM

## 2019-10-28 ENCOUNTER — Other Ambulatory Visit: Payer: Self-pay | Admitting: Physician Assistant

## 2019-10-28 DIAGNOSIS — E039 Hypothyroidism, unspecified: Secondary | ICD-10-CM

## 2019-10-30 ENCOUNTER — Encounter (INDEPENDENT_AMBULATORY_CARE_PROVIDER_SITE_OTHER): Payer: Self-pay | Admitting: Nurse Practitioner

## 2019-10-30 DIAGNOSIS — I890 Lymphedema, not elsewhere classified: Secondary | ICD-10-CM | POA: Insufficient documentation

## 2019-10-30 NOTE — Progress Notes (Signed)
SUBJECTIVE:  Patient ID: Karla Patton, female    DOB: 1951/02/15, 68 y.o.   MRN: [id number] Chief Complaint  Patient presents with  . New Patient (Initial Visit)    ref Tollie Pizza for varicose veins    HPI  Karla Patton is a 68 y.o. female presents today after referral by Marlyn Corporal, PA-C for lower extremity edema as well as pain in her left calf.  The patient is a somewhat poor historian regarding her history of venous procedures however she does note that several years ago there was an intervention on her veins to help her varicose veins.  However she cannot state whether it is a ablation, stripping or some sort of sclerotherapy.  The patient notes that her swelling is worse during the day and better in the morning.  She also does note that she has been exercising less and noticed that the swelling began to get worse after this.  She denies any sudden swelling of her lower extremities.  The patient does have medical grade 1 compression stockings however she cannot wear them on a regular basis.  She denies any fever, chills, nausea, vomiting or diarrhea.  Noninvasive studies show that the patient has no evidence of DVT or superficial venous thrombosis bilaterally.  The left lower extremity has no evidence of chronic venous insufficiency however there is evidence of prior ablation or vein stripping.  The right lower extremity has evidence of vein stripping or ligation however the mid great saphenous vein is being fed by a perforator.  Past Medical History:  Diagnosis Date  . Anxiety   . Back pain   . Diabetes mellitus without complication ([place])   . Hypothyroidism   . Thyroid disease   . Varicose vein     Past Surgical History:  Procedure Laterality Date  . ABDOMINAL HYSTERECTOMY  1994  . CHOLECYSTECTOMY  09-07-13  . COLONOSCOPY  2009   normal  . COLONOSCOPY WITH PROPOFOL N/A 10/20/2018   Procedure: COLONOSCOPY WITH PROPOFOL;  Surgeon: Robert Bellow, MD;  Location: [facility]  ENDOSCOPY;  Service: Endoscopy;  Laterality: N/A;  . INCONTINENCE SURGERY  2003  . left leg vein ligation  2015  . VARICOSE VEIN SURGERY  1990's   Dr Bary Castilla  . VEIN SURGERY Left 11/17/2017   vein closure procedure/Dr Jamal Collin    Social History   Socioeconomic History  . Marital status: Divorced    Spouse name: Not on file  . Number of children: 2  . Years of education: 33  . Highest education level: Not on file  Occupational History  . Occupation: Retired  Scientific laboratory technician  . Financial resource strain: Not on file  . Food insecurity    Worry: Not on file    Inability: Not on file  . Transportation needs    Medical: Not on file    Non-medical: Not on file  Tobacco Use  . Smoking status: Never Smoker  . Smokeless tobacco: Never Used  Substance and Sexual Activity  . Alcohol use: Yes    Alcohol/week: 7.0 standard drinks    Types: 7 Shots of liquor per week    Comment: 1 airplane bottle of [employer] Whiskey every night  . Drug use: No  . Sexual activity: Never    Partners: Male    Birth control/protection: None, Surgical  Lifestyle  . Physical activity    Days per week: Not on file    Minutes per session: Not on file  . Stress: Not on file  Relationships  . Social Herbalist on phone: Not on file    Gets together: Not on file    Attends religious service: Not on file    Active member of club or organization: Not on file    Attends meetings of clubs or organizations: Not on file    Relationship status: Not on file  . Intimate partner violence    Fear of current or ex partner: Not on file    Emotionally abused: Not on file    Physically abused: Not on file    Forced sexual activity: Not on file  Other Topics Concern  . Not on file  Social History Narrative   Retired from [employer] as Engineer, maintenance   2 children [place] (1977, 1979)   [place] 3 blood related   5 step-grandchildren    Enjoys going to [employer] and reading   No pets     Family  History  Problem Relation Age of Onset  . Diabetes Mother   . Pancreatitis Mother   . Heart disease Father   . Stroke Father        x 2  . Hypertension Father   . Fibromyalgia Sister   . Scoliosis Sister   . Arthritis Sister   . Diabetes Sister   . Breast cancer Neg Hx     No Known Allergies   Review of Systems   Review of Systems: Negative Unless Checked Constitutional: [] Weight loss  [] Fever  [] Chills Cardiac: [] Chest pain   []  Atrial Fibrillation  [] Palpitations   [] Shortness of breath when laying flat   [] Shortness of breath with exertion. [] Shortness of breath at rest Vascular:  [] Pain in legs with walking   [] Pain in legs with standing [] Pain in legs when laying flat   [] Claudication    [] Pain in feet when laying flat    [] History of DVT   [] Phlebitis   [x] Swelling in legs   [x] Varicose veins   [] Non-healing ulcers Pulmonary:   [] Uses home oxygen   [] Productive cough   [] Hemoptysis   [] Wheeze  [] COPD   [] Asthma Neurologic:  [] Dizziness   [] Seizures  [] Blackouts [] History of stroke   [] History of TIA  [] Aphasia   [] Temporary Blindness   [] Weakness or numbness in arm   [] Weakness or numbness in leg Musculoskeletal:   [] Joint swelling   [] Joint pain   [] Low back pain  []  History of Knee Replacement [x] Arthritis [] back Surgeries  []  Spinal Stenosis    Hematologic:  [] Easy bruising  [] Easy bleeding   [] Hypercoagulable state   [] Anemic Gastrointestinal:  [] Diarrhea   [] Vomiting  [] Gastroesophageal reflux/heartburn   [] Difficulty swallowing. [] Abdominal pain Genitourinary:  [] Chronic kidney disease   [] Difficult urination  [] Anuric   [] Blood in urine [] Frequent urination  [] Burning with urination   [] Hematuria Skin:  [] Rashes   [] Ulcers [] Wounds Psychological:  [x] History of anxiety   []  History of major depression  []  Memory Difficulties      OBJECTIVE:   Physical Exam  BP (!) 151/82 (BP Location: Right Arm)   Pulse 75   Resp 16   Wt 195 lb 12.8 oz (88.8 kg)   BMI 32.58  kg/m   Gen: WD/WN, NAD Head: [place]/AT, No temporalis wasting.  Ear/Nose/Throat: Hearing grossly intact, nares w/o erythema or drainage Eyes: PER, EOMI, sclera nonicteric.  Neck: Supple, no masses.  No JVD.  Pulmonary:  Good air movement, no use of accessory muscles.  Cardiac: RRR Vascular:  2+ edema bilaterally scattered  varicosities bilaterally Vessel Right Left  Radial Palpable Palpable  Dorsalis Pedis Palpable Palpable  Posterior Tibial Palpable Palpable   Gastrointestinal: soft, non-distended. No guarding/no peritoneal signs.  Musculoskeletal: M/S 5/5 throughout.  No deformity or atrophy.  Neurologic: Pain and light touch intact in extremities.  Symmetrical.  Speech is fluent. Motor exam as listed above. Psychiatric: Judgment intact, Mood & affect appropriate for pt's clinical situation. Dermatologic: No Venous rashes. No Ulcers Noted.  No changes consistent with cellulitis. Lymph : No Cervical lymphadenopathy, no lichenification or skin changes of chronic lymphedema.       ASSESSMENT AND PLAN:  1. Lymphedema No surgery or intervention at this point in time.  I have reviewed my discussion with the patient regarding venous insufficiency and why it causes symptoms. I have discussed with the patient the chronic skin changes that accompany venous insufficiency and the long term sequela such as ulceration. Patient will contnue wearing graduated compression stockings on a daily basis.  The patient will also elevate her lower extremities much as possible and attempt to get at least 30 minutes of exercise daily.  We discussed possible lymph pump therapy however at this time patient does not wish to proceed with a lymph pump.  The patient will continue with conservative therapy at this time.  She will contact her office if conservative therapy is not effective or if swelling begins to worsen.  She will follow-up on an as-needed basis.   2. Benign essential HTN Continue antihypertensive  medications as already ordered, these medications have been reviewed and there are no changes at this time.   3. Pure hypercholesterolemia Continue statin as ordered and reviewed, no changes at this time    Current Outpatient Medications on File Prior to Visit  Medication Sig Dispense Refill  . ALPRAZolam (XANAX) 0.5 MG tablet TAKE 1 TABLET(0.5 MG) BY MOUTH TWICE DAILY 60 tablet 5  . APPLE CIDER VINEGAR PO Take 600 mg by mouth.    [name] aspirin EC 81 MG tablet Take 81 mg by mouth daily.    . B COMPLEX-C PO Take by mouth.    . Calcium Carb-Cholecalciferol (CALCIUM PLUS D3 ABSORBABLE PO) Take 600 mg by mouth. With magnesium 300 mg, vit D 1000 IU    . Cholecalciferol (VITAMIN D-3 PO) Take by mouth. 10,000 IU    . clobetasol cream (TEMOVATE) 0.05 %     . CoenzymeQ10-Isoleucine-Glycine (CO Q-10) 100-50-25 MG TB24 Take 1,000 mg PE/L by mouth. With cinnamon    . Continuous Blood Gluc Receiver (FREESTYLE LIBRE 14 DAY READER) DEVI 1 Device by Does not apply route 4 (four) times daily -  before meals and at bedtime. 1 Device 12  . Continuous Blood Gluc Sensor (FREESTYLE LIBRE 14 DAY SENSOR) MISC 1 Device by Does not apply route 4 (four) times daily -  before meals and at bedtime. 6 each 12  . CONTOUR NEXT TEST test strip CHECK BLOOD SUGAR BEFORE MEALS AND AT BEDTIME 400 each 3  . furosemide (LASIX) 20 MG tablet Take 1 tablet (20 mg total) by mouth daily. 30 tablet 3  . insulin aspart (NOVOLOG FLEXPEN) 100 UNIT/ML FlexPen INJECT SUBCUTANEOUSLY AS PER SLIDING SCALE BEFORE EACH MEAL. May take up to 50 units per day. 45 mL 0  . Insulin Pen Needle (B-D ULTRAFINE III SHORT PEN) 31G X 8 MM MISC USE AS DIRECTED FOUR TIMES DAILY, BEFORE MEALS AND AT BEDTIME 100 each 3  . LANTUS SOLOSTAR 100 UNIT/ML Solostar Pen ADMINISTER 10 UNITS UNDER THE  SKIN TWICE DAILY 15 mL 5  . Lysine 500 MG TABS Take by mouth.    . Melatonin 2.5 MG CHEW Chew by mouth. gummies 1 at bedtime    . potassium chloride SA (K-DUR) 20 MEQ tablet  Take 1 tablet (20 mEq total) by mouth daily. Take with furosemide (lasix) when needed 30 tablet 3  . pregabalin (LYRICA) 225 MG capsule TAKE 1 CAPSULE(225 MG) BY MOUTH AT BEDTIME 90 capsule 1   No current facility-administered medications on file prior to visit.     There are no Patient Instructions on file for this visit. No follow-ups on file.   Kris Hartmann, NP  This note was completed with Sales executive.  Any errors are purely unintentional.

## 2019-11-07 ENCOUNTER — Encounter: Payer: Self-pay | Admitting: Physician Assistant

## 2019-11-24 DIAGNOSIS — Z20828 Contact with and (suspected) exposure to other viral communicable diseases: Secondary | ICD-10-CM | POA: Diagnosis not present

## 2019-12-03 ENCOUNTER — Encounter: Payer: Self-pay | Admitting: Physician Assistant

## 2019-12-03 DIAGNOSIS — E1042 Type 1 diabetes mellitus with diabetic polyneuropathy: Secondary | ICD-10-CM

## 2019-12-05 MED ORDER — FREESTYLE LIBRE 14 DAY SENSOR MISC: 1.0000 | Freq: Three times a day (TID) | 12 refills | Status: DC

## 2019-12-11 ENCOUNTER — Other Ambulatory Visit: Payer: Self-pay | Admitting: Physician Assistant

## 2019-12-11 DIAGNOSIS — R600 Localized edema: Secondary | ICD-10-CM

## 2019-12-12 ENCOUNTER — Other Ambulatory Visit: Payer: Self-pay | Admitting: Physician Assistant

## 2019-12-12 DIAGNOSIS — E119 Type 2 diabetes mellitus without complications: Secondary | ICD-10-CM

## 2019-12-28 ENCOUNTER — Other Ambulatory Visit: Payer: Self-pay | Admitting: Physician Assistant

## 2019-12-28 DIAGNOSIS — E11319 Type 2 diabetes mellitus with unspecified diabetic retinopathy without macular edema: Secondary | ICD-10-CM

## 2019-12-28 DIAGNOSIS — IMO0002 Reserved for concepts with insufficient information to code with codable children: Secondary | ICD-10-CM

## 2020-01-19 NOTE — Progress Notes (Signed)
Patient: Karla Patton, Female    DOB: 02/16/1951, 69 y.o.   MRN: [id number] Visit Date: 01/24/2020  Today's Provider: Mar Daring, PA-C   Chief Complaint  Patient presents with  . Medicare Wellness   Subjective:     Annual wellness visit Karla Patton is a 69 y.o. female. She feels well. She reports exercising some. She reports she is sleeping fairly well.  ----------------------------------------------------------- 06/03/19-Normal mammogram. Repeat screening in one year. 10/20/2018-colonoscopy   Review of Systems  Constitutional: Positive for fatigue.  HENT: Positive for ear pain, postnasal drip and sinus pressure.   Eyes: Positive for discharge and redness.  Respiratory: Negative.   Cardiovascular: Positive for leg swelling ("ankles").  Gastrointestinal: Positive for abdominal distention and abdominal pain.  Endocrine: Negative.   Genitourinary: Negative.   Musculoskeletal: Positive for arthralgias, back pain and joint swelling.  Skin: Negative.   Allergic/Immunologic: Positive for environmental allergies.  Neurological: Positive for numbness.  Hematological: Bruises/bleeds easily.  Psychiatric/Behavioral: The patient is nervous/anxious.     Social History   Socioeconomic History  . Marital status: Single    Spouse name: Not on file  . Number of children: 2  . Years of education: 29  . Highest education level: Not on file  Occupational History  . Occupation: Retired  Tobacco Use  . Smoking status: Never Smoker  . Smokeless tobacco: Never Used  Substance and Sexual Activity  . Alcohol use: Yes    Alcohol/week: 7.0 standard drinks    Types: 7 Shots of liquor per week    Comment: 1 airplane bottle of Fireball Whiskey every night  . Drug use: No  . Sexual activity: Never    Partners: Male    Birth control/protection: None, Surgical  Other Topics Concern  . 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 Difficulty of Paying Living Expenses: Not on file  Food Insecurity:   . Worried About Charity fundraiser in the Last Year: Not on file  . Ran Out of Food in the Last Year: Not on file  Transportation Needs:   . Lack of Transportation (Medical): Not on file  . Lack of Transportation (Non-Medical): Not on file  Physical Activity:   . Days of Exercise per Week: Not on file  . Minutes of Exercise per Session: Not on file  Stress:   . Feeling of Stress : Not on file  Social Connections:   . Frequency of Communication with Friends and Family: Not on file  . Frequency of Social Gatherings with Friends and Family: Not on file  . Attends Religious Services: Not on file  . Active Member of Clubs or Organizations: Not on file  . Attends Archivist Meetings: Not on file  . Marital Status: Not on file  Intimate Partner Violence:   . Fear of Current or Ex-Partner: Not on file  . Emotionally Abused: Not on file  . Physically Abused: Not on file  . Sexually Abused: Not on file    Past Medical History:  Diagnosis Date  . Anxiety   . Back pain   . Diabetes mellitus without complication ([place])   . Hypothyroidism   .  Thyroid disease   . Varicose vein      Patient Active Problem List   Diagnosis Date Noted  . Lymphedema 10/30/2019  . Type 1 diabetes mellitus with diabetic nephropathy ([place]) 09/01/2018  . Type 1 diabetes mellitus with polyneuropathy ([place]) 09/01/2018  . Benign essential HTN 03/03/2018  . Precordial pain 03/03/2018  . Hyperlipidemia, mixed 02/19/2018  . Disequilibrium 02/10/2018  . Headache disorder 02/10/2018  . Osteopenia 01/13/2018  . 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  Hepatic cyst 08/28/2017  . Tinnitus of right ear 08/28/2017  . Hearing loss of right ear 08/28/2017  . Constipation 08/28/2017  . Pure hypercholesterolemia 05/29/2017  . Background diabetic retinopathy ([place]) 05/22/2015  . Hypothyroidism 10/10/2014  . Diabetic hypoglycemia ([place]) 10/10/2014  . Encounter for screening colonoscopy 03/22/2014  . Varicose veins of lower extremities with other complications [id number]    Past Surgical History:  Procedure Laterality Date  . ABDOMINAL HYSTERECTOMY  1994  . CHOLECYSTECTOMY  09-07-13  . COLONOSCOPY  2009   normal  . COLONOSCOPY WITH PROPOFOL N/A 10/20/2018   Procedure: COLONOSCOPY WITH PROPOFOL;  Surgeon: Robert Bellow, MD;  Location: [facility] ENDOSCOPY;  Service: Endoscopy;  Laterality: N/A;  . INCONTINENCE SURGERY  2003  . left leg vein ligation  2015  . VARICOSE VEIN SURGERY  1990's   Dr Bary Castilla  . VEIN SURGERY Left 11/17/2017   vein closure procedure/Dr Jamal Collin    Her family history includes Arthritis in her sister; Diabetes in her mother and sister; Fibromyalgia in her sister; Heart disease in her father; Hypertension in her father; Pancreatitis in her mother; Scoliosis in her sister; Stroke in her father. There is no history of Breast cancer.   Current Outpatient Medications:  .  APPLE CIDER VINEGAR PO, Take 600 mg by mouth., Disp: , Rfl:  .  aspirin EC 81 MG tablet, Take 81 mg by mouth daily., Disp: , Rfl:  .  Calcium Carb-Cholecalciferol (CALCIUM PLUS D3 ABSORBABLE PO), Take 600 mg by mouth. With magnesium 300 mg, vit D 1000 IU, Disp: , Rfl:  .  Cholecalciferol (VITAMIN D-3 PO), Take by mouth. 10,000 IU, Disp: , Rfl:  .  clobetasol cream (TEMOVATE) 0.05 %, , Disp: , Rfl:  .  Continuous Blood Gluc Receiver (FREESTYLE LIBRE 14 DAY READER) DEVI, 1 Device by Does not apply route 4 (four) times daily -  before meals and at bedtime., Disp: 1 Device, Rfl: 12 .  furosemide (LASIX) 20 MG tablet, TAKE 1 TABLET(20 MG) BY MOUTH DAILY, Disp: 90  tablet, Rfl: 0 .  Insulin Pen Needle (B-D ULTRAFINE III SHORT PEN) 31G X 8 MM MISC, USE AS DIRECTED FOUR TIMES DAILY, BEFORE MEALS AND AT BEDTIME, Disp: 100 each, Rfl: 3 .  LANTUS SOLOSTAR 100 UNIT/ML Solostar Pen, ADMINISTER 10 UNITS UNDER THE SKIN TWICE DAILY, Disp: 15 mL, Rfl: 5 .  Lysine 500 MG TABS, Take by mouth., Disp: , Rfl:  .  Melatonin 2.5 MG CHEW, Chew by mouth. gummies 1 at bedtime, Disp: , Rfl:  .  NOVOLOG FLEXPEN 100 UNIT/ML FlexPen, INJECT UNDER THE SKIN AS PER SLIDING SCALE BEFORE EACH MEAL, MAY INJECT UP TO 50 UNITS PER DAY, Disp: 45 mL, Rfl: 0 .  potassium chloride SA (KLOR-CON) 20 MEQ tablet, TAKE 1 TABLET BY MOUTH EVERY DAY. TAKE WITH FUROSEMIDE WHEN NEEDED AS DIRECTED, Disp: 90 tablet, Rfl: 0 .  ALPRAZolam (XANAX) 0.5 MG tablet, TAKE 1 TABLET(0.5 MG)  BY MOUTH TWICE DAILY, Disp: 60 tablet, Rfl: 5 .  B COMPLEX-C PO, Take by mouth., Disp: , Rfl:  .  CoenzymeQ10-Isoleucine-Glycine (CO Q-10) 100-50-25 MG TB24, Take 1,000 mg PE/L by mouth. With cinnamon, Disp: , Rfl:  .  Continuous Blood Gluc Sensor (FREESTYLE LIBRE 14 DAY SENSOR) MISC, 1 Device by Does not apply route 4 (four) times daily -  before meals and at bedtime., Disp: 6 each, Rfl: 12 .  CONTOUR NEXT TEST test strip, CHECK BLOOD SUGAR BEFORE MEALS AND AT BEDTIME, Disp: 400 each, Rfl: 3 .  levothyroxine (SYNTHROID) 100 MCG tablet, TAKE 1 TABLET BY MOUTH DAILY BEFORE BREAKFAST, Disp: 90 tablet, Rfl: 1 .  pregabalin (LYRICA) 225 MG capsule, TAKE 1 CAPSULE(225 MG) BY MOUTH AT BEDTIME, Disp: 90 capsule, Rfl: 1  Patient Care Team: Rubye Beach as PCP - General (Physician Assistant) Bary Castilla, Forest Gleason, MD as Consulting Physician (General Surgery) Shepard General, MD as Referring Physician ([place]) Christene Lye, MD (General Surgery)    Objective:    Vitals: BP 134/84 (BP Location: Left Arm, Patient Position: Sitting, Cuff Size: Large)   Pulse 73   Temp (!) 96.8 F (36 C) (Temporal)    Resp 16   Ht 5\' 5"  (1.651 m)   Wt 197 lb (89.4 kg)   BMI 32.78 kg/m   Physical Exam Constitutional:      Appearance: Normal appearance.  HENT:     Head: Normocephalic and atraumatic.     Right Ear: Tympanic membrane, ear canal and external ear normal.     Left Ear: Tympanic membrane, ear canal and external ear normal.     Nose: Nose normal.     Mouth/Throat:     Mouth: Mucous membranes are moist.     Pharynx: Oropharynx is clear.  Eyes:     Extraocular Movements: Extraocular movements intact.     Conjunctiva/sclera: Conjunctivae normal.     Pupils: Pupils are equal, round, and reactive to light.  Cardiovascular:     Rate and Rhythm: Normal rate and regular rhythm.     Pulses: Normal pulses.     Heart sounds: Normal heart sounds.  Pulmonary:     Effort: Pulmonary effort is normal.     Breath sounds: Normal breath sounds.  Abdominal:     General: Bowel sounds are normal.  Musculoskeletal:        General: Normal range of motion.     Cervical back: Normal range of motion and neck supple.  Skin:    General: Skin is warm and dry.  Neurological:     Mental Status: She is alert and oriented to person, place, and time.  Psychiatric:        Mood and Affect: Mood normal.        Behavior: Behavior normal.        Thought Content: Thought content normal.        Judgment: Judgment normal.     Activities of Daily Living In your present state of health, do you have any difficulty performing the following activities: 01/20/2020 10/19/2019  Hearing? N N  Vision? N N  Difficulty concentrating or making decisions? N N  Walking or climbing stairs? N N  Dressing or bathing? N N  Doing errands, shopping? N N  Some recent data might be hidden    Fall Risk Assessment Fall Risk  01/20/2020 03/07/2019 05/31/2018 05/29/2017  Falls in the past year? 0 0 No No  Number falls in past yr:  0 - - -  Injury with Fall? 0 - - -     Depression Screen PHQ 2/9 Scores 01/20/2020 03/07/2019 05/31/2018  05/29/2017  PHQ - 2 Score 1 0 0 0  PHQ- 9 Score 6 - - 4   Results for orders placed or performed in visit on 01/20/20  CBC with Differential/Platelet  Result Value Ref Range   WBC 4.8 3.4 - 10.8 x10E3/uL   RBC 4.45 3.77 - 5.28 x10E6/uL   Hemoglobin 12.3 11.1 - 15.9 g/dL   Hematocrit 36.9 34.0 - 46.6 %   MCV 83 79 - 97 fL   MCH 27.6 26.6 - 33.0 pg   MCHC 33.3 31.5 - 35.7 g/dL   RDW 14.0 11.7 - 15.4 %   Platelets 436 150 - 450 x10E3/uL   Neutrophils 43 Not Estab. %   Lymphs 37 Not Estab. %   Monocytes 13 Not Estab. %   Eos 5 Not Estab. %   Basos 2 Not Estab. %   Neutrophils Absolute 2.0 1.4 - 7.0 x10E3/uL   Lymphocytes Absolute 1.8 0.7 - 3.1 x10E3/uL   Monocytes Absolute 0.6 0.1 - 0.9 x10E3/uL   EOS (ABSOLUTE) 0.2 0.0 - 0.4 x10E3/uL   Basophils Absolute 0.1 0.0 - 0.2 x10E3/uL   Immature Granulocytes 0 Not Estab. %   Immature Grans (Abs) 0.0 0.0 - 0.1 x10E3/uL  Comprehensive metabolic panel  Result Value Ref Range   Glucose 76 65 - 99 mg/dL   BUN 8 8 - 27 mg/dL   Creatinine, Ser 0.74 0.57 - 1.00 mg/dL   GFR calc non Af Amer 84 >59 mL/min/1.73   GFR calc Af Amer 97 >59 mL/min/1.73   BUN/Creatinine Ratio 11 (L) 12 - 28   Sodium 136 134 - 144 mmol/L   Potassium 4.7 3.5 - 5.2 mmol/L   Chloride 94 (L) 96 - 106 mmol/L   CO2 26 20 - 29 mmol/L   Calcium 9.6 8.7 - 10.3 mg/dL   Total Protein 7.5 6.0 - 8.5 g/dL   Albumin 4.6 3.8 - 4.8 g/dL   Globulin, Total 2.9 1.5 - 4.5 g/dL   Albumin/Globulin Ratio 1.6 1.2 - 2.2   Bilirubin Total 0.3 0.0 - 1.2 mg/dL   Alkaline Phosphatase 124 (H) 39 - 117 IU/L   AST 20 0 - 40 IU/L   ALT 20 0 - 32 IU/L  Lipid panel  Result Value Ref Range   Cholesterol, Total 223 (H) 100 - 199 mg/dL   Triglycerides 91 0 - 149 mg/dL   HDL 66 >39 mg/dL   VLDL Cholesterol Cal 16 5 - 40 mg/dL   LDL Chol Calc (NIH) 141 (H) 0 - 99 mg/dL   Chol/HDL Ratio 3.4 0.0 - 4.4 ratio  Hemoglobin A1c  Result Value Ref Range   Hgb A1c MFr Bld 7.3 (H) 4.8 - 5.6 %   Est.  average glucose Bld gHb Est-mCnc 163 mg/dL  TSH  Result Value Ref Range   TSH 2.050 0.450 - 4.500 uIU/mL     No flowsheet data found.    Assessment & Plan:     Annual Wellness Visit  Reviewed patient's Family Medical History Reviewed and updated list of patient's medical providers Assessment of cognitive impairment was done Assessed patient's functional ability Established a written schedule for health screening [place] Completed and Reviewed  Exercise Activities and Dietary recommendations Goals   None     Immunization History  Administered Date(s) Administered  . Influenza Split 09/02/2014  .  Influenza, High Dose Seasonal PF 09/01/2018  . Influenza,inj,Quad PF,6+ Mos 08/28/2017  . Influenza-Unspecified 10/17/2016, 09/21/2019  . Pneumococcal Conjugate-13 02/01/2014  . Pneumococcal Polysaccharide-23 09/02/2014, 10/19/2019  . Tdap 11/26/2016  . Zoster 02/28/2014    Health Maintenance  Topic Date Due  . URINE MICROALBUMIN  03/06/2020  . HEMOGLOBIN A1C  07/19/2020  . FOOT EXAM  07/24/2020  . OPHTHALMOLOGY EXAM  08/16/2020  . MAMMOGRAM  06/02/2021  . TETANUS/TDAP  11/26/2026  . COLONOSCOPY  10/20/2028  . INFLUENZA VACCINE  Completed  . DEXA SCAN  Completed  . Hepatitis C Screening  Completed  . PNA vac Low Risk Adult  Completed     Discussed health benefits of physical activity, and encouraged her to engage in regular exercise appropriate for her age and condition.    1. Type 1 diabetes mellitus with diabetic polyneuropathy (HCC) Stable. Continue Lantus 10 units SQ BID and Novolog per sliding scale with each meal. Will check labs as below and f/u pending results. - CBC with Differential/Platelet - Comprehensive metabolic panel - Lipid panel - Hemoglobin A1c - TSH  2. Benign essential HTN Stable. Continue Furosemide 20mg . Will check labs as below and f/u pending results. - CBC with Differential/Platelet - Comprehensive metabolic  panel - Lipid panel - Hemoglobin A1c - TSH  3. Pure hypercholesterolemia Stable. Diet controlled. Patient has declined "adding" medications in the past. Will check labs as below and f/u pending results. - CBC with Differential/Platelet - Comprehensive metabolic panel - Lipid panel - Hemoglobin A1c - TSH  4. Hypothyroidism, unspecified type Stable. Continue levothyroxine 172mcg. Will check labs as below and f/u pending results. - CBC with Differential/Platelet - Comprehensive metabolic panel - Lipid panel - Hemoglobin A1c - TSH  5. Encounter for screening mammogram for breast cancer There is no family history of breast cancer. She does perform regular self breast exams. Mammogram was ordered as below. Information for [facility] Breast clinic was given to patient so she may schedule her mammogram at her convenience. - MM 3D SCREEN BREAST BILATERAL; Future  ------------------------------------------------------------------------------------------------------------    Mar Daring, PA-C  [facility]

## 2020-01-20 ENCOUNTER — Other Ambulatory Visit: Payer: Self-pay

## 2020-01-20 ENCOUNTER — Ambulatory Visit (INDEPENDENT_AMBULATORY_CARE_PROVIDER_SITE_OTHER): Payer: Medicare Other | Admitting: Physician Assistant

## 2020-01-20 ENCOUNTER — Other Ambulatory Visit: Payer: Self-pay | Admitting: Physician Assistant

## 2020-01-20 ENCOUNTER — Encounter: Payer: Self-pay | Admitting: Physician Assistant

## 2020-01-20 VITALS — BP 134/84 | HR 73 | Temp 96.8°F | Resp 16 | Ht 65.0 in | Wt 197.0 lb

## 2020-01-20 DIAGNOSIS — Z794 Long term (current) use of insulin: Secondary | ICD-10-CM

## 2020-01-20 DIAGNOSIS — E1042 Type 1 diabetes mellitus with diabetic polyneuropathy: Secondary | ICD-10-CM | POA: Diagnosis not present

## 2020-01-20 DIAGNOSIS — E7800 Pure hypercholesterolemia, unspecified: Secondary | ICD-10-CM

## 2020-01-20 DIAGNOSIS — I10 Essential (primary) hypertension: Secondary | ICD-10-CM | POA: Diagnosis not present

## 2020-01-20 DIAGNOSIS — E039 Hypothyroidism, unspecified: Secondary | ICD-10-CM

## 2020-01-20 DIAGNOSIS — E1140 Type 2 diabetes mellitus with diabetic neuropathy, unspecified: Secondary | ICD-10-CM

## 2020-01-20 DIAGNOSIS — F411 Generalized anxiety disorder: Secondary | ICD-10-CM

## 2020-01-20 DIAGNOSIS — Z1231 Encounter for screening mammogram for malignant neoplasm of breast: Secondary | ICD-10-CM

## 2020-01-21 LAB — CBC WITH DIFFERENTIAL/PLATELET
Basophils Absolute: 0.1 10*3/uL (ref 0.0–0.2)
Basos: 2 %
EOS (ABSOLUTE): 0.2 10*3/uL (ref 0.0–0.4)
Eos: 5 %
Hematocrit: 36.9 % (ref 34.0–46.6)
Hemoglobin: 12.3 g/dL (ref 11.1–15.9)
Immature Grans (Abs): 0 10*3/uL (ref 0.0–0.1)
Immature Granulocytes: 0 %
Lymphocytes Absolute: 1.8 10*3/uL (ref 0.7–3.1)
Lymphs: 37 %
MCH: 27.6 pg (ref 26.6–33.0)
MCHC: 33.3 g/dL (ref 31.5–35.7)
MCV: 83 fL (ref 79–97)
Monocytes Absolute: 0.6 10*3/uL (ref 0.1–0.9)
Monocytes: 13 %
Neutrophils Absolute: 2 10*3/uL (ref 1.4–7.0)
Neutrophils: 43 %
Platelets: 436 10*3/uL (ref 150–450)
RBC: 4.45 x10E6/uL (ref 3.77–5.28)
RDW: 14 % (ref 11.7–15.4)
WBC: 4.8 10*3/uL (ref 3.4–10.8)

## 2020-01-21 LAB — COMPREHENSIVE METABOLIC PANEL
ALT: 20 IU/L (ref 0–32)
AST: 20 IU/L (ref 0–40)
Albumin/Globulin Ratio: 1.6 (ref 1.2–2.2)
Albumin: 4.6 g/dL (ref 3.8–4.8)
Alkaline Phosphatase: 124 IU/L — ABNORMAL HIGH (ref 39–117)
BUN/Creatinine Ratio: 11 — ABNORMAL LOW (ref 12–28)
BUN: 8 mg/dL (ref 8–27)
Bilirubin Total: 0.3 mg/dL (ref 0.0–1.2)
CO2: 26 mmol/L (ref 20–29)
Calcium: 9.6 mg/dL (ref 8.7–10.3)
Chloride: 94 mmol/L — ABNORMAL LOW (ref 96–106)
Creatinine, Ser: 0.74 mg/dL (ref 0.57–1.00)
GFR calc Af Amer: 97 mL/min/{1.73_m2} (ref >59)
GFR calc non Af Amer: 84 mL/min/{1.73_m2} (ref >59)
Globulin, Total: 2.9 g/dL (ref 1.5–4.5)
Glucose: 76 mg/dL (ref 65–99)
Potassium: 4.7 mmol/L (ref 3.5–5.2)
Sodium: 136 mmol/L (ref 134–144)
Total Protein: 7.5 g/dL (ref 6.0–8.5)

## 2020-01-21 LAB — LIPID PANEL
Chol/HDL Ratio: 3.4 ratio (ref 0.0–4.4)
Cholesterol, Total: 223 mg/dL — ABNORMAL HIGH (ref 100–199)
HDL: 66 mg/dL (ref >39)
LDL Chol Calc (NIH): 141 mg/dL — ABNORMAL HIGH (ref 0–99)
Triglycerides: 91 mg/dL (ref 0–149)
VLDL Cholesterol Cal: 16 mg/dL (ref 5–40)

## 2020-01-21 LAB — TSH: TSH: 2.05 u[IU]/mL (ref 0.450–4.500)

## 2020-01-21 LAB — HEMOGLOBIN A1C
Est. average glucose Bld gHb Est-mCnc: 163 mg/dL
Hgb A1c MFr Bld: 7.3 % — ABNORMAL HIGH (ref 4.8–5.6)

## 2020-01-21 NOTE — Telephone Encounter (Signed)
Requested medication (s) are due for refill today: yes  Requested medication (s) are on the active medication list: yes  Last refill: 11/11/2019  Future visit scheduled: yes  Notes to clinic: due for labs This refill cannot be delegated    Requested Prescriptions  Pending Prescriptions Disp Refills   levothyroxine (SYNTHROID) 100 MCG tablet [Pharmacy Med Name: LEVOTHYROXINE 0.100MG  (100MCG) TAB] 90 tablet 0    Sig: TAKE 1 TABLET BY MOUTH DAILY BEFORE BREAKFAST      Endocrinology:  Hypothyroid Agents Failed - 01/20/2020 10:18 PM      Failed - TSH needs to be rechecked within 3 months after an abnormal result. Refill until TSH is due.      Failed - TSH in normal range and within 360 days    TSH  Date Value Ref Range Status  12/06/2018 3.700 0.450 - 4.500 uIU/mL Final          Passed - Valid encounter within last 12 months    Recent Outpatient Visits           3 months ago Type 1 diabetes mellitus with diabetic polyneuropathy [facility])   [place], [place], PA-C   6 months ago Type 1 diabetes mellitus with diabetic polyneuropathy [facility])   [place], Anderson [place] M, PA-C   10 months ago Type 1 diabetes mellitus with diabetic polyneuropathy [facility])   [place], [place], PA-C   1 year ago Type 1 diabetes mellitus with diabetic polyneuropathy [facility])   [facility] [place], [place], PA-C   1 year ago Type 1 diabetes mellitus with diabetic polyneuropathy [facility])   [place], Clearnce Sorrel, PA-C       Future Appointments             In 2 months Burnette, Clearnce Sorrel, PA-C [facility]              pregabalin (LYRICA) 225 MG capsule [employer] Med Name: PREGABALIN 225MG  CAPSULES] 90 capsule 0    Sig: TAKE 1 CAPSULE(225 MG) BY MOUTH AT BEDTIME      Not Delegated - Neurology:  Anticonvulsants - Controlled Failed - 01/20/2020 10:18 PM      Failed  - This refill cannot be delegated      Passed - Valid encounter within last 12 months    Recent Outpatient Visits           3 months ago Type 1 diabetes mellitus with diabetic polyneuropathy [facility])   [facility] [place], [place], PA-C   6 months ago Type 1 diabetes mellitus with diabetic polyneuropathy [facility])   [facility] [place], [place], PA-C   10 months ago Type 1 diabetes mellitus with diabetic polyneuropathy [facility])   [place], [place], PA-C   1 year ago Type 1 diabetes mellitus with diabetic polyneuropathy [facility])   [place], [place], PA-C   1 year ago Type 1 diabetes mellitus with diabetic polyneuropathy [facility])   [place], [place]       Future Appointments             In 2 months Burnette, Clearnce Sorrel, PA-C [facility]              ALPRAZolam Duanne Moron) 0.5 MG tablet [Pharmacy Med Name: ALPRAZOLAM 0.5MG  TABLETS] 60 tablet     Sig: TAKE 1 TABLET(0.5 MG) BY MOUTH TWICE DAILY  Not Delegated - Psychiatry:  Anxiolytics/Hypnotics Failed - 01/20/2020 10:18 PM      Failed - This refill cannot be delegated      Failed - Urine Drug Screen completed in last 360 days.      Passed - Valid encounter within last 6 months    Recent Outpatient Visits           3 months ago Type 1 diabetes mellitus with diabetic polyneuropathy [facility])   [place], [place], [place]   6 months ago Type 1 diabetes mellitus with diabetic polyneuropathy [facility])   [place], [place], [place]   10 months ago Type 1 diabetes mellitus with diabetic polyneuropathy [facility])   [place], [place], PA-C   1 year ago Type 1 diabetes mellitus with diabetic polyneuropathy [facility])   [place], [place], PA-C   1 year ago Type 1 diabetes mellitus with diabetic  polyneuropathy [facility])   [facility] [place], Clearnce Sorrel, PA-C       Future Appointments             In 2 months Burnette, Clearnce Sorrel, PA-C [facility]

## 2020-01-23 ENCOUNTER — Telehealth: Payer: Self-pay

## 2020-01-23 NOTE — Telephone Encounter (Signed)
-----   Message from Mar Daring, [place] sent at 01/23/2020 11:09 AM EST ----- Blood count is normal. Kidney function is normal. Alkaline Phosphatase is borderline high. Other liver enzymes are normal. Sodium, potassium and calcium are normal. Cholesterol has increased compared to last check. Would recommend to restart a cholesterol lowering medication to lower risk of cardiovascular events. A1c is improved to 7.3 from 7.7. Thyroid is stable.

## 2020-01-23 NOTE — Telephone Encounter (Signed)
Patient advised as directed below. Per patient she does not want to add anymore medicines and that she will try other alternatives.

## 2020-01-23 NOTE — Telephone Encounter (Signed)
noted 

## 2020-02-01 DIAGNOSIS — Z124 Encounter for screening for malignant neoplasm of cervix: Secondary | ICD-10-CM | POA: Diagnosis not present

## 2020-02-01 DIAGNOSIS — M8580 Other specified disorders of bone density and structure, unspecified site: Secondary | ICD-10-CM | POA: Diagnosis not present

## 2020-02-01 DIAGNOSIS — Z01419 Encounter for gynecological examination (general) (routine) without abnormal findings: Secondary | ICD-10-CM | POA: Diagnosis not present

## 2020-02-01 DIAGNOSIS — Z8619 Personal history of other infectious and parasitic diseases: Secondary | ICD-10-CM | POA: Diagnosis not present

## 2020-02-02 DIAGNOSIS — M8588 Other specified disorders of bone density and structure, other site: Secondary | ICD-10-CM | POA: Diagnosis not present

## 2020-03-18 ENCOUNTER — Other Ambulatory Visit: Payer: Self-pay | Admitting: Physician Assistant

## 2020-03-18 DIAGNOSIS — R600 Localized edema: Secondary | ICD-10-CM

## 2020-03-18 NOTE — Telephone Encounter (Signed)
Requested Prescriptions  Pending Prescriptions Disp Refills  . potassium chloride SA (KLOR-CON) 20 MEQ tablet [Pharmacy Med Name: POTASSIUM CL 20MEQ ER TABLETS] 90 tablet 0    Sig: TAKE 1 TABLET BY MOUTH EVERY DAY. TAKE WITH FUROSEMIDE WHEN NEEDED AS DIRECTED     Endocrinology:  Minerals - Potassium Supplementation Passed - 03/18/2020  5:20 PM      Passed - K in normal range and within 360 days    Potassium  Date Value Ref Range Status  01/20/2020 4.7 3.5 - 5.2 mmol/L Final         Passed - Cr in normal range and within 360 days    Creatinine, Ser  Date Value Ref Range Status  01/20/2020 0.74 0.57 - 1.00 mg/dL Final   Creatinine,U  Date Value Ref Range Status  11/21/2014 40.3 mg/dL Final         Passed - Valid encounter within last 12 months    Recent Outpatient Visits          5 months ago Type 1 diabetes mellitus with diabetic polyneuropathy [facility])   [place], [place], PA-C   7 months ago Type 1 diabetes mellitus with diabetic polyneuropathy [facility])   [place], [place], PA-C   1 year ago Type 1 diabetes mellitus with diabetic polyneuropathy [facility])   [place], [place], PA-C   1 year ago Type 1 diabetes mellitus with diabetic polyneuropathy [facility])   [place], [place], PA-C   1 year ago Type 1 diabetes mellitus with diabetic polyneuropathy [facility])   [facility] [place], Clearnce Sorrel, PA-C      Future Appointments            In 1 month Burnette, Clearnce Sorrel, PA-C [facility]           . furosemide (LASIX) 20 MG tablet [Pharmacy Med Name: FUROSEMIDE 20MG  TABLETS] 90 tablet 0    Sig: TAKE 1 TABLET(20 MG) BY MOUTH DAILY     Cardiovascular:  Diuretics - Loop Passed - 03/18/2020  5:20 PM      Passed - K in normal range and within 360 days    Potassium  Date Value Ref Range Status  01/20/2020 4.7 3.5 - 5.2 mmol/L Final        Passed - Ca in normal range and within 360 days    Calcium  Date Value Ref Range Status  01/20/2020 9.6 8.7 - 10.3 mg/dL Final         Passed - Na in normal range and within 360 days    Sodium  Date Value Ref Range Status  01/20/2020 136 134 - 144 mmol/L Final         Passed - Cr in normal range and within 360 days    Creatinine, Ser  Date Value Ref Range Status  01/20/2020 0.74 0.57 - 1.00 mg/dL Final   Creatinine,U  Date Value Ref Range Status  11/21/2014 40.3 mg/dL Final         Passed - Last BP in normal range    BP Readings from Last 1 Encounters:  01/20/20 134/84         Passed - Valid encounter within last 6 months    Recent Outpatient Visits          5 months ago Type 1 diabetes mellitus with diabetic polyneuropathy [facility])   [place], [place], [place]   7  months ago Type 1 diabetes mellitus with diabetic polyneuropathy [facility])   [place], [place], [place]   1 year ago Type 1 diabetes mellitus with diabetic polyneuropathy [facility])   [place], [place], PA-C   1 year ago Type 1 diabetes mellitus with diabetic polyneuropathy [facility])   [place], [place], PA-C   1 year ago Type 1 diabetes mellitus with diabetic polyneuropathy [facility])   [place], Clearnce Sorrel, PA-C      Future Appointments            In 1 month Burnette, Clearnce Sorrel, PA-C [facility], [place]

## 2020-04-13 NOTE — Progress Notes (Signed)
Established patient visit   Patient: Karla Patton   DOB: June 23, 1951   69 y.o. Female  MRN: [id number] Visit Date: 04/19/2020  Today's healthcare provider: Mar Daring, PA-C   Chief Complaint  Patient presents with  . Follow-up    T1DM   Subjective    HPI Diabetes Mellitus Type I, Follow-up  Lab Results  Component Value Date   HGBA1C 7.5 (A) 04/19/2020   HGBA1C 7.3 (H) 01/20/2020   HGBA1C 7.7 (A) 10/19/2019   Last seen for diabetes 3 months ago.  Management since then includes:Stable, continue Lantus 10 units SQ BID and Novolog per sliding scale with each meal. Labs was done as well. She reports excellent compliance with treatment. She is not having side effects.  Symptoms: No fatigue No foot ulcerations Yes appetite changes No nausea No paresthesia (numbness or tingling) of the feet  No polydipsia (excessive thirst) No polyuria (frequent urination) Per patient is less urination No visual disturbances  No vomiting  Home blood sugar records: fasting range: 49-90's the 49 is when she wakes up but she is good after she eats/drink a coffee.   Current insulin regiment: Lantus 10 units SQ BID and Novolog Most Recent Eye Exam: Has appointment scheduled Current exercise: none Current diet habits: well balanced  ----------------------------------------------------------------------------------------- Lipid/Cholesterol, Follow-up  Last lipid panel Other pertinent labs  Lab Results  Component Value Date   CHOL 223 (H) 01/20/2020   HDL 66 01/20/2020   LDLCALC 141 (H) 01/20/2020   TRIG 91 01/20/2020   CHOLHDL 3.4 01/20/2020   Lab Results  Component Value Date   ALT 20 01/20/2020   AST 20 01/20/2020   PLT 436 01/20/2020   TSH 2.050 01/20/2020     She was last seen for this 3 months ago.  Management since that visit includes  Cholesterol has increased compared to last check.Recommend to restart a cholesterol lowering medication to lower risk of  cardiovascular events. Patient not on medication for cholesterol.  Symptoms: No chest pain No chest pressure/discomfort Yes lower extremity edema No numbness or tingling of extremity No orthopnea No palpitations No paroxysmal nocturnal dyspnea No speech difficulty No syncope   Wt Readings from Last 3 Encounters:  04/19/20 196 lb 9.6 oz (89.2 kg)  01/20/20 197 lb (89.4 kg)  10/27/19 195 lb 12.8 oz (88.8 kg)   The 10-year ASCVD risk score Mikey Bussing DC Jr., et al., 2013) is: 17.1%  -----------------------------------------------------------------------------------------  Patient Active Problem List   Diagnosis Date Noted  . Lymphedema 10/30/2019  . Type 1 diabetes mellitus with diabetic nephropathy ([place]) 09/01/2018  . Type 1 diabetes mellitus with polyneuropathy ([place]) 09/01/2018  . Benign essential HTN 03/03/2018  . Precordial pain 03/03/2018  . Hyperlipidemia, mixed 02/19/2018  . Disequilibrium 02/10/2018  . Headache disorder 02/10/2018  . Osteopenia 01/13/2018  . 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 Hepatic cyst 08/28/2017  . Tinnitus of right ear 08/28/2017  . Hearing loss of right ear 08/28/2017  . Constipation 08/28/2017  . Pure hypercholesterolemia 05/29/2017  . Background diabetic retinopathy ([place]) 05/22/2015  . Hypothyroidism 10/10/2014  . Diabetic hypoglycemia ([place]) 10/10/2014  . Encounter for screening colonoscopy 03/22/2014  . Varicose veins of lower extremities with other complications [id number]   Past Medical History:  Diagnosis Date  . Anxiety   . Back pain   . Diabetes mellitus without complication ([place])   . Hypothyroidism   . Thyroid disease   .  Varicose vein    No Known Allergies     Medications: Outpatient Medications Prior to Visit  Medication Sig  . ALPRAZolam (XANAX) 0.5 MG tablet TAKE 1 TABLET(0.5 MG) BY MOUTH TWICE DAILY  . APPLE CIDER VINEGAR PO Take 600  mg by mouth.  [name] aspirin EC 81 MG tablet Take 81 mg by mouth daily.  . B COMPLEX-C PO Take by mouth.  . beta carotene 25000 UNIT capsule Take 25,000 Units by mouth daily.  . Cholecalciferol (VITAMIN D-3 PO) Take 250 mcg by mouth daily. 10,000 IU   . clobetasol cream (TEMOVATE) 0.05 %   . Continuous Blood Gluc Receiver (FREESTYLE LIBRE 14 DAY READER) DEVI 1 Device by Does not apply route 4 (four) times daily -  before meals and at bedtime.  . Continuous Blood Gluc Sensor (FREESTYLE LIBRE 14 DAY SENSOR) MISC 1 Device by Does not apply route 4 (four) times daily -  before meals and at bedtime.  . CONTOUR NEXT TEST test strip CHECK BLOOD SUGAR BEFORE MEALS AND AT BEDTIME  . Ferrous Sulfate Dried (HIGH POTENCY IRON) 65 MG TABS Take by mouth.  . furosemide (LASIX) 20 MG tablet TAKE 1 TABLET(20 MG) BY MOUTH DAILY  . Horse Chestnut 300 MG CAPS Take 300 mg by mouth 2 (two) times daily.  . Insulin Pen Needle (B-D ULTRAFINE III SHORT PEN) 31G X 8 MM MISC USE AS DIRECTED FOUR TIMES DAILY, BEFORE MEALS AND AT BEDTIME  . LANTUS SOLOSTAR 100 UNIT/ML Solostar Pen ADMINISTER 10 UNITS UNDER THE SKIN TWICE DAILY  . levothyroxine (SYNTHROID) 100 MCG tablet TAKE 1 TABLET BY MOUTH DAILY BEFORE BREAKFAST  . Lysine 500 MG TABS Take by mouth.  [name] MAGNESIUM-ZINC PO Take by mouth.  . Melatonin 2.5 MG CHEW Chew by mouth. gummies 1 at bedtime  . NOVOLOG FLEXPEN 100 UNIT/ML FlexPen INJECT UNDER THE SKIN AS PER SLIDING SCALE BEFORE EACH MEAL, MAY INJECT UP TO 50 UNITS PER DAY  . potassium chloride SA (KLOR-CON) 20 MEQ tablet TAKE 1 TABLET BY MOUTH EVERY DAY. TAKE WITH FUROSEMIDE WHEN NEEDED AS DIRECTED  . pregabalin (LYRICA) 225 MG capsule TAKE 1 CAPSULE(225 MG) BY MOUTH AT BEDTIME  . Calcium Carb-Cholecalciferol (CALCIUM PLUS D3 ABSORBABLE PO) Take 600 mg by mouth. With magnesium 300 mg, vit D 1000 IU  . CoenzymeQ10-Isoleucine-Glycine (CO Q-10) 100-50-25 MG TB24 Take 1,000 mg PE/L by mouth. With cinnamon   No  facility-administered medications prior to visit.    Review of Systems  Constitutional: Positive for activity change.  Respiratory: Negative.   Cardiovascular: Positive for leg swelling (Legs feel heavy).  Musculoskeletal: Positive for back pain (Lower).  Hematological: Negative.   All other systems reviewed and are negative.   Last CBC Lab Results  Component Value Date   WBC 4.8 01/20/2020   HGB 12.3 01/20/2020   HCT 36.9 01/20/2020   MCV 83 01/20/2020   MCH 27.6 01/20/2020   RDW 14.0 01/20/2020   PLT 436 [id number]   Last metabolic panel Lab Results  Component Value Date   GLUCOSE 76 01/20/2020   NA 136 01/20/2020   K 4.7 01/20/2020   CL 94 (L) 01/20/2020   CO2 26 01/20/2020   BUN 8 01/20/2020   CREATININE 0.74 01/20/2020   GFRNONAA 84 01/20/2020   GFRAA 97 01/20/2020   CALCIUM 9.6 01/20/2020   PROT 7.5 01/20/2020   ALBUMIN 4.6 01/20/2020   LABGLOB 2.9 01/20/2020   AGRATIO 1.6 01/20/2020   BILITOT 0.3 01/20/2020   ALKPHOS  124 (H) 01/20/2020   AST 20 01/20/2020   ALT 20 01/20/2020   Last lipids Lab Results  Component Value Date   CHOL 223 (H) 01/20/2020   HDL 66 01/20/2020   LDLCALC 141 (H) 01/20/2020   TRIG 91 01/20/2020   CHOLHDL 3.4 01/20/2020       Objective    BP 129/75 (BP Location: Left Arm, Patient Position: Sitting, Cuff Size: Normal)   Pulse 74   Temp (!) 97.3 F (36.3 C) (Temporal)   Resp 16   Wt 196 lb 9.6 oz (89.2 kg)   BMI 32.72 kg/m  BP Readings from Last 3 Encounters:  04/19/20 129/75  01/20/20 134/84  10/27/19 (!) 151/82   Wt Readings from Last 3 Encounters:  04/19/20 196 lb 9.6 oz (89.2 kg)  01/20/20 197 lb (89.4 kg)  10/27/19 195 lb 12.8 oz (88.8 kg)      Physical Exam Vitals reviewed.  Constitutional:      General: She is not in acute distress.    Appearance: Normal appearance. She is well-developed. She is obese. She is not ill-appearing or diaphoretic.  Cardiovascular:     Rate and Rhythm: Normal rate and  regular rhythm.     Heart sounds: Normal heart sounds. No murmur. No friction rub. No gallop.   Pulmonary:     Effort: Pulmonary effort is normal. No respiratory distress.     Breath sounds: Normal breath sounds. No wheezing or rales.  Musculoskeletal:     Cervical back: Normal range of motion and neck supple.     Right lower leg: No edema.     Left lower leg: No edema.     Comments: Compression stockings on  Neurological:     Mental Status: She is alert.       Results for orders placed or performed in visit on 04/19/20  POCT glycosylated hemoglobin (Hb A1C)  Result Value Ref Range   Hemoglobin A1C 7.5 (A) 4.0 - 5.6 %   Est. average glucose Bld gHb Est-mCnc 169     Assessment & Plan    1. Type 1 diabetes mellitus with diabetic polyneuropathy (HCC) A1c increased slightly to 7.5 from 7.3. Continue current Lantus 10 untis BID and Novolog sliding scale. Return in 3 months.    No follow-ups on file.      Reynolds Bowl, PA-C, have reviewed all documentation for this visit. The documentation on 04/19/20 for the exam, diagnosis, procedures, and orders are all accurate and complete.   Rubye Beach  [facility] [phone] (phone) [phone] (fax)  [place]

## 2020-04-18 DIAGNOSIS — M9902 Segmental and somatic dysfunction of thoracic region: Secondary | ICD-10-CM | POA: Diagnosis not present

## 2020-04-18 DIAGNOSIS — M5136 Other intervertebral disc degeneration, lumbar region: Secondary | ICD-10-CM | POA: Diagnosis not present

## 2020-04-18 DIAGNOSIS — M9904 Segmental and somatic dysfunction of sacral region: Secondary | ICD-10-CM | POA: Diagnosis not present

## 2020-04-18 DIAGNOSIS — M542 Cervicalgia: Secondary | ICD-10-CM | POA: Diagnosis not present

## 2020-04-18 DIAGNOSIS — M461 Sacroiliitis, not elsewhere classified: Secondary | ICD-10-CM | POA: Diagnosis not present

## 2020-04-18 DIAGNOSIS — M9903 Segmental and somatic dysfunction of lumbar region: Secondary | ICD-10-CM | POA: Diagnosis not present

## 2020-04-18 DIAGNOSIS — M9901 Segmental and somatic dysfunction of cervical region: Secondary | ICD-10-CM | POA: Diagnosis not present

## 2020-04-19 ENCOUNTER — Encounter: Payer: Self-pay | Admitting: Physician Assistant

## 2020-04-19 ENCOUNTER — Ambulatory Visit (INDEPENDENT_AMBULATORY_CARE_PROVIDER_SITE_OTHER): Payer: Medicare Other | Admitting: Physician Assistant

## 2020-04-19 VITALS — BP 129/75 | HR 74 | Temp 97.3°F | Resp 16 | Wt 196.6 lb

## 2020-04-19 DIAGNOSIS — E1042 Type 1 diabetes mellitus with diabetic polyneuropathy: Secondary | ICD-10-CM

## 2020-04-19 LAB — POCT GLYCOSYLATED HEMOGLOBIN (HGB A1C)
Est. average glucose Bld gHb Est-mCnc: 169
Hemoglobin A1C: 7.5 % — AB (ref 4.0–5.6)

## 2020-04-23 NOTE — Progress Notes (Signed)
Subjective:   Karla Patton is a 69 y.o. female who presents for an Initial Medicare Annual Wellness Visit.  Review of Systems    N/A  Cardiac Risk Factors include: advanced age (>62men, >15 women);diabetes mellitus     Objective:    There were no vitals filed for this visit. There is no height or weight on file to calculate BMI.  Advanced Directives 04/24/2020 10/20/2018 05/29/2017 02/26/2017  Does Patient Have a Medical Advance Directive? Yes Yes Yes Yes  Type of Paramedic of [place];Living will - [place];Living will [place];Living will  Copy of [place] in Chart? No - copy requested - - -    Current Medications (verified) Outpatient Encounter Medications as of 04/24/2020  Medication Sig  . ALPRAZolam (XANAX) 0.5 MG tablet TAKE 1 TABLET(0.5 MG) BY MOUTH TWICE DAILY  . APPLE CIDER VINEGAR PO Take 600 mg by mouth daily.   [name] aspirin EC 81 MG tablet Take 81 mg by mouth daily.  . B COMPLEX-C PO Take by mouth daily. + vitamin c  . beta carotene 25000 UNIT capsule Take 25,000 Units by mouth daily.  . Cholecalciferol (VITAMIN D-3 PO) Take 250 mcg by mouth daily. 10,000 IU   . clobetasol cream (TEMOVATE) [id number] % Apply 1 application topically as needed (for bumps on arms/shoulders from nervous itch).   . Continuous Blood Gluc Receiver (FREESTYLE LIBRE 14 DAY READER) DEVI 1 Device by Does not apply route 4 (four) times daily -  before meals and at bedtime.  . Continuous Blood Gluc Sensor (FREESTYLE LIBRE 14 DAY SENSOR) MISC 1 Device by Does not apply route 4 (four) times daily -  before meals and at bedtime.  . CONTOUR NEXT TEST test strip CHECK BLOOD SUGAR BEFORE MEALS AND AT BEDTIME  . Ferrous Sulfate Dried (HIGH POTENCY IRON) 65 MG TABS Take 2 tablets by mouth daily.   . furosemide (LASIX) 20 MG tablet TAKE 1 TABLET(20 MG) BY MOUTH DAILY  . Horse Chestnut 300 MG CAPS Take 300 mg by mouth 2 (two) times  daily.  . Insulin Pen Needle (B-D ULTRAFINE III SHORT PEN) 31G X 8 MM MISC USE AS DIRECTED FOUR TIMES DAILY, BEFORE MEALS AND AT BEDTIME  . LANTUS SOLOSTAR 100 UNIT/ML Solostar Pen ADMINISTER 10 UNITS UNDER THE SKIN TWICE DAILY (Patient taking differently: 11 Units. )  . levothyroxine (SYNTHROID) 100 MCG tablet TAKE 1 TABLET BY MOUTH DAILY BEFORE BREAKFAST  . Lysine 500 MG TABS Take 1,000 mg by mouth daily.   [name] MAGNESIUM-ZINC PO Take by mouth daily.   . Melatonin 2.5 MG CHEW Chew by mouth. gummies 1 at bedtime  . NOVOLOG FLEXPEN 100 UNIT/ML FlexPen INJECT UNDER THE SKIN AS PER SLIDING SCALE BEFORE EACH MEAL, MAY INJECT UP TO 50 UNITS PER DAY  . potassium chloride SA (KLOR-CON) 20 MEQ tablet TAKE 1 TABLET BY MOUTH EVERY DAY. TAKE WITH FUROSEMIDE WHEN NEEDED AS DIRECTED  . pregabalin (LYRICA) 225 MG capsule TAKE 1 CAPSULE(225 MG) BY MOUTH AT BEDTIME  . Calcium Carb-Cholecalciferol (CALCIUM PLUS D3 ABSORBABLE PO) Take 600 mg by mouth. With magnesium 300 mg, vit D 1000 IU  . CoenzymeQ10-Isoleucine-Glycine (CO Q-10) 100-50-25 MG TB24 Take 1,000 mg PE/L by mouth. With cinnamon   No facility-administered encounter medications on file as of 04/24/2020.    Allergies (verified) Patient has no known allergies.   History: Past Medical History:  Diagnosis Date  . Anxiety   .  Back pain   . Diabetes mellitus without complication ([place])   . Hypothyroidism   . Thyroid disease   . Varicose vein    Past Surgical History:  Procedure Laterality Date  . ABDOMINAL HYSTERECTOMY  1994  . CHOLECYSTECTOMY  09-07-13  . COLONOSCOPY  2009   normal  . COLONOSCOPY WITH PROPOFOL N/A 10/20/2018   Procedure: COLONOSCOPY WITH PROPOFOL;  Surgeon: Robert Bellow, MD;  Location: [facility] ENDOSCOPY;  Service: Endoscopy;  Laterality: N/A;  . INCONTINENCE SURGERY  2003  . left leg vein ligation  2015  . VARICOSE VEIN SURGERY  1990's   Dr Bary Castilla  . VEIN SURGERY Left 11/17/2017   vein closure procedure/Dr Jamal Collin    Family History  Problem Relation Age of Onset  . Diabetes Mother   . Pancreatitis Mother   . Heart disease Father   . Stroke Father        x 2  . Hypertension Father   . Fibromyalgia Sister   . Scoliosis Sister   . Arthritis Sister   . Diabetes Sister   . Breast cancer Neg Hx    Social History   Socioeconomic History  . Marital status: Divorced    Spouse name: Not on file  . Number of children: 2  . Years of education: 6  . Highest education level: High school graduate  Occupational History  . Occupation: Retired  Tobacco Use  . Smoking status: Never Smoker  . Smokeless tobacco: Never Used  Substance and Sexual Activity  . Alcohol use: Yes    Alcohol/week: 7.0 standard drinks    Types: 7 Shots of liquor per week    Comment: vodka 1 drink nightly  . Drug use: No  . Sexual activity: Never    Partners: Male    Birth control/protection: None, Surgical  Other Topics Concern  . Not on file  Social History Narrative   Retired from [employer] as Engineer, maintenance   2 children [place] (1977, 1979)   [place] 3 blood related   5 step-grandchildren    Enjoys going to [employer] and reading   No pets    Social Determinants of Radio broadcast assistant Strain: [place]   . Difficulty of Paying Living Expenses: Not hard at all  Food Insecurity: No Food Insecurity  . Worried About Charity fundraiser in the Last Year: Never true  . Ran Out of Food in the Last Year: Never true  Transportation Needs: No Transportation Needs  . Lack of Transportation (Medical): No  . Lack of Transportation (Non-Medical): No  Physical Activity: Inactive  . Days of Exercise per Week: 0 days  . Minutes of Exercise per Session: 0 min  Stress: No Stress Concern Present  . Feeling of Stress : Not at all  Social Connections: Slightly Isolated  . Frequency of Communication with Friends and Family: More than three times a week  . Frequency of Social Gatherings with Friends and Family: More  than three times a week  . Attends Religious Services: More than 4 times per year  . Active Member of Clubs or Organizations: No  . Attends Archivist Meetings: Never  . Marital Status: Living with partner    Tobacco Counseling Counseling given: Not Answered   Clinical Intake:  Pre-visit preparation completed: Yes  Pain : No/denies pain     Nutritional Risks: None Diabetes: Yes  How often do you need to have someone help you when you read instructions, pamphlets, or  other written materials from your doctor or pharmacy?: 1 - Never   Diabetes:  Is the patient diabetic?  Yes  If diabetic, was a CBG obtained today?  No  Did the patient bring in their glucometer from home?  No  How often do you monitor your CBG's? Six to eight times daily.   Financial Strains and Diabetes Management:  Are you having any financial strains with the device, your supplies or your medication? No .  Does the patient want to be seen by Chronic Care Management for management of their diabetes?  No  Would the patient like to be referred to a Nutritionist or for Diabetic Management?  No   Diabetic Exams:  Diabetic Eye Exam: Completed 08/17/19. Repeat yearly.  Diabetic Foot Exam: Completed 07/25/19. Repeat yearly.  Interpreter Needed?: No  Information entered by :: [facility], LPN   Activities of Daily Living In your present state of health, do you have any difficulty performing the following activities: 04/24/2020 01/20/2020  Hearing? N N  Vision? N N  Difficulty concentrating or making decisions? N N  Walking or climbing stairs? N N  Dressing or bathing? N N  Doing errands, shopping? N N  Preparing Food and eating ? N -  Using the Toilet? N -  In the past six months, have you accidently leaked urine? Y -  Comment Occasionally with urges, wears protections daily. -  Do you have problems with loss of bowel control? N -  Managing your Medications? N -  Managing your Finances? N -   Housekeeping or managing your Housekeeping? N -  Some recent data might be hidden     Immunizations and Health Maintenance Immunization History  Administered Date(s) Administered  . Influenza Split 09/02/2014  . Influenza, High Dose Seasonal PF 09/01/2018  . Influenza,inj,Quad PF,6+ Mos 08/28/2017  . Influenza-Unspecified 10/17/2016, 09/21/2019  . PFIZER SARS-COV-2 Vaccination 02/01/2020, 02/22/2020  . Pneumococcal Conjugate-13 02/01/2014  . Pneumococcal Polysaccharide-23 09/02/2014, 10/19/2019  . Tdap 11/26/2016  . Zoster 02/28/2014   Health Maintenance Due  Topic Date Due  . URINE MICROALBUMIN  03/06/2020    Patient Care Team: Rubye Beach as PCP - General (Physician Assistant) Dasher, Rayvon Char, MD (Dermatology) Kem Parkinson, MD (Ophthalmology) Bary Castilla Forest Gleason, MD as Consulting Physician (General Surgery) Anabel Bene, MD as Referring Physician (Neurology)  Indicate any recent Medical Services you may have received from other than [facility] providers in the past year (date may be approximate).     Assessment:   This is a routine wellness examination for Emil.  Hearing/Vision screen No exam data present  Dietary issues and exercise activities discussed: Current Exercise Habits: The patient does not participate in regular exercise at present, Exercise limited by: None identified  Goals    . DIET - INCREASE WATER INTAKE     Recommend to drink at least 6-8 8oz glasses of water per day.      Depression Screen PHQ 2/9 Scores 04/24/2020 01/20/2020 03/07/2019 05/31/2018 05/29/2017 02/26/2017  PHQ - 2 Score 0 1 0 0 0 0  PHQ- 9 Score - 6 - - 4 2    Fall Risk Fall Risk  04/24/2020 01/20/2020 03/07/2019 05/31/2018 05/29/2017  Falls in the past year? 0 0 0 No No  Number falls in past yr: 0 0 - - -  Injury with Fall? 0 0 - - -    FALL RISK PREVENTION PERTAINING TO THE HOME:  Any stairs in or around the home? Yes  If so, are there any without handrails? No    Home free of loose throw rugs in walkways, pet beds, electrical cords, etc? Yes  Adequate lighting in your home to reduce risk of falls? Yes   ASSISTIVE DEVICES UTILIZED TO PREVENT FALLS:  Life alert? No  Use of a cane, walker or w/c? No  Grab bars in the bathroom? Yes  Shower chair or bench in shower? No  Elevated toilet seat or a handicapped toilet? Yes    TIMED UP AND GO:  Was the test performed? No .     Cognitive Function:     6CIT Screen 04/24/2020  What Year? 0 points  What month? 0 points  What time? 0 points  Count back from 20 0 points  Months in reverse 0 points  Repeat phrase 0 points  Total Score 0    Screening Tests Health Maintenance  Topic Date Due  . URINE MICROALBUMIN  03/06/2020  . INFLUENZA VACCINE  07/22/2020  . FOOT EXAM  07/24/2020  . OPHTHALMOLOGY EXAM  08/16/2020  . HEMOGLOBIN A1C  10/19/2020  . MAMMOGRAM  06/02/2021  . DEXA SCAN  01/13/2023  . TETANUS/TDAP  11/26/2026  . COLONOSCOPY  10/20/2028  . COVID-19 Vaccine  Completed  . Hepatitis C Screening  Completed  . PNA vac Low Risk Adult  Completed    Qualifies for Shingles Vaccine? Yes  Zostavax completed 02/28/14. Due for Shingrix. Pt has been advised to call insurance company to determine out of pocket expense. Advised may also receive vaccine at local pharmacy or Health Dept. Verbalized acceptance and understanding.  Tdap: Up to date  Flu Vaccine: Up to date  Pneumococcal Vaccine: Completed series  Cancer Screenings:  Colorectal Screening: Completed 10/20/18. Repeat every 10 years.  Mammogram: Completed 06/03/19. Scheduled 06/05/20.  Bone Density: Completed 01/13/18. Results reflect OSTEOPENIA. Repeat every 5 years.   Lung Cancer Screening: (Low Dose CT Chest recommended if Age 23-80 years, 30 pack-year currently smoking OR have quit w/in 15years.) does not qualify.   Additional Screening:  Hepatitis C Screening: Up to date  Dental Screening: Recommended annual dental  exams for proper oral hygiene  Community Resource Referral:  CRR required this visit?  No       Plan:  I have personally reviewed and addressed the Medicare Annual Wellness questionnaire and have noted the following in the patient's chart:  A. Medical and social history B. Use of alcohol, tobacco or illicit drugs  C. Current medications and supplements D. Functional ability and status E.  Nutritional status F.  Physical activity G. Advance directives H. List of other physicians I.  Hospitalizations, surgeries, and ER visits in previous 12 months J.  [place] such as hearing and vision if needed, cognitive and depression L. Referrals and appointments   In addition, I have reviewed and discussed with patient certain preventive protocols, quality metrics, and best practice recommendations. A written personalized care plan for preventive services as well as general preventive health recommendations were provided to patient.   Glendora Score, [place]   [id number]  Nurse Health Advisor    Nurse Notes: Pt needs a urine check at next in office apt.

## 2020-04-24 ENCOUNTER — Ambulatory Visit (INDEPENDENT_AMBULATORY_CARE_PROVIDER_SITE_OTHER): Payer: Medicare Other

## 2020-04-24 ENCOUNTER — Other Ambulatory Visit: Payer: Self-pay

## 2020-04-24 DIAGNOSIS — Z0000 Encounter for general adult medical examination without abnormal findings: Secondary | ICD-10-CM

## 2020-04-24 NOTE — Patient Instructions (Signed)
Karla Patton , Thank you for taking time to come for your Medicare Wellness Visit. I appreciate your ongoing commitment to your health goals. Please review the following plan we discussed and let me know if I can assist you in the future.   Screening recommendations/referrals: Colonoscopy: Up to date, due 09/2028 Mammogram: Up to date, scheduled 06/05/20 Bone Density: Up to date, due 12/2022 Recommended yearly ophthalmology/optometry visit for glaucoma screening and checkup Recommended yearly dental visit for hygiene and checkup  Vaccinations: Influenza vaccine: Up to date Pneumococcal vaccine: Completed series Tdap vaccine: Up to date Shingles vaccine: Pt declines today.     Advanced directives: Please bring a copy of your POA (Power of Attorney) and/or Living Will to your next appointment.   Conditions/risks identified: Recommend to drink at least 6-8 8oz glasses of water per day.  Next appointment: 07/23/20 @ 9:40 AM with [place] 65 Years and Older, Female Preventive care refers to lifestyle choices and visits with your health care provider that can promote health and wellness. What does preventive care include?  A yearly physical exam. This is also called an annual well check.  Dental exams once or twice a year.  Routine eye exams. Ask your health care provider how often you should have your eyes checked.  Personal lifestyle choices, including:  Daily care of your teeth and gums.  Regular physical activity.  Eating a healthy diet.  Avoiding tobacco and drug use.  Limiting alcohol use.  Practicing safe sex.  Taking low-dose aspirin every day.  Taking vitamin and mineral supplements as recommended by your health care provider. What happens during an annual well check? The services and screenings done by your health care provider during your annual well check will depend on your age, overall health, lifestyle risk factors, and family history  of disease. Counseling  Your health care provider may ask you questions about your:  Alcohol use.  Tobacco use.  Drug use.  Emotional well-being.  Home and relationship well-being.  Sexual activity.  Eating habits.  History of falls.  Memory and ability to understand (cognition).  Work and work Statistician.  Reproductive health. Screening  You may have the following tests or measurements:  Height, weight, and BMI.  Blood pressure.  Lipid and cholesterol levels. These may be checked every 5 years, or more frequently if you are over 34 years old.  Skin check.  Lung cancer screening. You may have this screening every year starting at age 56 if you have a 30-pack-year history of smoking and currently smoke or have quit within the past 15 years.  Fecal occult blood test (FOBT) of the stool. You may have this test every year starting at age 27.  Flexible sigmoidoscopy or colonoscopy. You may have a sigmoidoscopy every 5 years or a colonoscopy every 10 years starting at age 10.  Hepatitis C blood test.  Hepatitis B blood test.  Sexually transmitted disease (STD) testing.  Diabetes screening. This is done by checking your blood sugar (glucose) after you have not eaten for a while (fasting). You may have this done every 1-3 years.  Bone density scan. This is done to screen for osteoporosis. You may have this done starting at age 35.  Mammogram. This may be done every 1-2 years. Talk to your health care provider about how often you should have regular mammograms. Talk with your health care provider about your test results, treatment options, and if necessary, the need for more tests.  Vaccines  Your health care provider may recommend certain vaccines, such as:  Influenza vaccine. This is recommended every year.  Tetanus, diphtheria, and acellular pertussis (Tdap, Td) vaccine. You may need a Td booster every 10 years.  Zoster vaccine. You may need this after age  35.  Pneumococcal 13-valent conjugate (PCV13) vaccine. One dose is recommended after age 45.  Pneumococcal polysaccharide (PPSV23) vaccine. One dose is recommended after age 34. Talk to your health care provider about which screenings and vaccines you need and how often you need them. This information is not intended to replace advice given to you by your health care provider. Make sure you discuss any questions you have with your health care provider. Document Released: 01/04/2016 Document Revised: 08/27/2016 Document Reviewed: 10/09/2015 Elsevier Interactive Patient Education  2017 [place] Prevention in the Home Falls can cause injuries. They can happen to people of all ages. There are many things you can do to make your home safe and to help prevent falls. What can I do on the outside of my home?  Regularly fix the edges of walkways and driveways and fix any cracks.  Remove anything that might make you trip as you walk through a door, such as a raised step or threshold.  Trim any bushes or trees on the path to your home.  Use bright outdoor lighting.  Clear any walking paths of anything that might make someone trip, such as rocks or tools.  Regularly check to see if handrails are loose or broken. Make sure that both sides of any steps have handrails.  Any raised decks and porches should have guardrails on the edges.  Have any leaves, snow, or ice cleared regularly.  Use sand or salt on walking paths during winter.  Clean up any spills in your garage right away. This includes oil or grease spills. What can I do in the bathroom?  Use night lights.  Install grab bars by the toilet and in the tub and shower. Do not use towel bars as grab bars.  Use non-skid mats or decals in the tub or shower.  If you need to sit down in the shower, use a plastic, non-slip stool.  Keep the floor dry. Clean up any water that spills on the floor as soon as it happens.  Remove  soap buildup in the tub or shower regularly.  Attach bath mats securely with double-sided non-slip rug tape.  Do not have throw rugs and other things on the floor that can make you trip. What can I do in the bedroom?  Use night lights.  Make sure that you have a light by your bed that is easy to reach.  Do not use any sheets or blankets that are too big for your bed. They should not hang down onto the floor.  Have a firm chair that has side arms. You can use this for support while you get dressed.  Do not have throw rugs and other things on the floor that can make you trip. What can I do in the kitchen?  Clean up any spills right away.  Avoid walking on wet floors.  Keep items that you use a lot in easy-to-reach places.  If you need to reach something above you, use a strong step stool that has a grab bar.  Keep electrical cords out of the way.  Do not use floor polish or wax that makes floors slippery. If you must use wax, use non-skid floor wax.  Do not have throw rugs and other things on the floor that can make you trip. What can I do with my stairs?  Do not leave any items on the stairs.  Make sure that there are handrails on both sides of the stairs and use them. Fix handrails that are broken or loose. Make sure that handrails are as long as the stairways.  Check any carpeting to make sure that it is firmly attached to the stairs. Fix any carpet that is loose or worn.  Avoid having throw rugs at the top or bottom of the stairs. If you do have throw rugs, attach them to the floor with carpet tape.  Make sure that you have a light switch at the top of the stairs and the bottom of the stairs. If you do not have them, ask someone to add them for you. What else can I do to help prevent falls?  Wear shoes that:  Do not have high heels.  Have rubber bottoms.  Are comfortable and fit you well.  Are closed at the toe. Do not wear sandals.  If you use a  stepladder:  Make sure that it is fully opened. Do not climb a closed stepladder.  Make sure that both sides of the stepladder are locked into place.  Ask someone to hold it for you, if possible.  Clearly mark and make sure that you can see:  Any grab bars or handrails.  First and last steps.  Where the edge of each step is.  Use tools that help you move around (mobility aids) if they are needed. These include:  Canes.  Walkers.  Scooters.  Crutches.  Turn on the lights when you go into a dark area. Replace any light bulbs as soon as they burn out.  Set up your furniture so you have a clear path. Avoid moving your furniture around.  If any of your floors are uneven, fix them.  If there are any pets around you, be aware of where they are.  Review your medicines with your doctor. Some medicines can make you feel dizzy. This can increase your chance of falling. Ask your doctor what other things that you can do to help prevent falls. This information is not intended to replace advice given to you by your health care provider. Make sure you discuss any questions you have with your health care provider. Document Released: 10/04/2009 Document Revised: 05/15/2016 Document Reviewed: 01/12/2015 Elsevier Interactive Patient Education  2017 [employer].

## 2020-06-05 ENCOUNTER — Ambulatory Visit
Admission: RE | Admit: 2020-06-05 | Discharge: 2020-06-05 | Disposition: A | Discharge status: Home / Self Care | Payer: Medicare Other | Source: Ambulatory Visit | Attending: Physician Assistant | Admitting: Physician Assistant

## 2020-06-05 ENCOUNTER — Other Ambulatory Visit: Payer: Self-pay

## 2020-06-05 DIAGNOSIS — Z1231 Encounter for screening mammogram for malignant neoplasm of breast: Secondary | ICD-10-CM

## 2020-06-07 ENCOUNTER — Telehealth: Payer: Self-pay

## 2020-06-07 NOTE — Telephone Encounter (Signed)
Visible to patient:  Yes (seen  

## 2020-06-07 NOTE — Telephone Encounter (Signed)
-----   Message from Mar Daring, PA-C sent at 06/07/2020  8:43 AM EDT ----- Normal mammogram. Repeat screening in one year.'

## 2020-06-09 ENCOUNTER — Other Ambulatory Visit: Payer: Self-pay | Admitting: Physician Assistant

## 2020-06-09 DIAGNOSIS — Z794 Long term (current) use of insulin: Secondary | ICD-10-CM

## 2020-06-09 DIAGNOSIS — R600 Localized edema: Secondary | ICD-10-CM

## 2020-06-09 DIAGNOSIS — E1140 Type 2 diabetes mellitus with diabetic neuropathy, unspecified: Secondary | ICD-10-CM

## 2020-06-09 NOTE — Telephone Encounter (Signed)
Requested Prescriptions  Pending Prescriptions Disp Refills  . furosemide (LASIX) 20 MG tablet [Pharmacy Med Name: FUROSEMIDE 20MG  TABLETS] 90 tablet 1    Sig: TAKE 1 TABLET(20 MG) BY MOUTH DAILY     Cardiovascular:  Diuretics - Loop Passed - 06/09/2020 10:10 AM      Passed - K in normal range and within 360 days    Potassium  Date Value Ref Range Status  01/20/2020 4.7 3.5 - 5.2 mmol/L Final         Passed - Ca in normal range and within 360 days    Calcium  Date Value Ref Range Status  01/20/2020 9.6 8.7 - 10.3 mg/dL Final         Passed - Na in normal range and within 360 days    Sodium  Date Value Ref Range Status  01/20/2020 136 134 - 144 mmol/L Final         Passed - Cr in normal range and within 360 days    Creatinine, Ser  Date Value Ref Range Status  01/20/2020 0.74 0.57 - 1.00 mg/dL Final   Creatinine,U  Date Value Ref Range Status  11/21/2014 40.3 mg/dL Final         Passed - Last BP in normal range    BP Readings from Last 1 Encounters:  04/19/20 129/75         Passed - Valid encounter within last 6 months    Recent Outpatient Visits          1 month ago Type 1 diabetes mellitus with diabetic polyneuropathy [facility])   [place], [place], PA-C   7 months ago Type 1 diabetes mellitus with diabetic polyneuropathy [facility])   [place], [place], PA-C   10 months ago Type 1 diabetes mellitus with diabetic polyneuropathy [facility])   [place], [place], PA-C   1 year ago Type 1 diabetes mellitus with diabetic polyneuropathy [facility])   [place], [place], PA-C   1 year ago Type 1 diabetes mellitus with diabetic polyneuropathy [facility])   [place], Clearnce Sorrel, [place]      Future Appointments            In 1 month Burnette, Clearnce Sorrel, PA-C [employer], [facility]           . potassium chloride SA (KLOR-CON) 20 MEQ tablet  [Pharmacy Med Name: POTASSIUM CL 20MEQ ER TABLETS] 90 tablet 1    Sig: TAKE 1 TABLET BY MOUTH EVERY DAY. TAKE WITH FUROSEMIDE WHEN NEEDED AS DIRECTED     Endocrinology:  Minerals - Potassium Supplementation Passed - 06/09/2020 10:10 AM      Passed - K in normal range and within 360 days    Potassium  Date Value Ref Range Status  01/20/2020 4.7 3.5 - 5.2 mmol/L Final         Passed - Cr in normal range and within 360 days    Creatinine, Ser  Date Value Ref Range Status  01/20/2020 0.74 0.57 - 1.00 mg/dL Final   Creatinine,U  Date Value Ref Range Status  11/21/2014 40.3 mg/dL Final         Passed - Valid encounter within last 12 months    Recent Outpatient Visits          1 month ago Type 1 diabetes mellitus with diabetic polyneuropathy [facility])   [facility] [place], [place], [place]   7  months ago Type 1 diabetes mellitus with diabetic polyneuropathy [facility])   [place], [place] M, [place]   10 months ago Type 1 diabetes mellitus with diabetic polyneuropathy [facility])   [place], [place], PA-C   1 year ago Type 1 diabetes mellitus with diabetic polyneuropathy [facility])   [place], [place], PA-C   1 year ago Type 1 diabetes mellitus with diabetic polyneuropathy [facility])   [place], Clearnce Sorrel, PA-C      Future Appointments            In 1 month Burnette, Clearnce Sorrel, PA-C [employer], [facility]           . pregabalin (LYRICA) 225 MG capsule [Pharmacy Med Name: PREGABALIN 225MG  CAPSULES] 90 capsule     Sig: TAKE 1 CAPSULE(225 MG) BY MOUTH AT BEDTIME     Not Delegated - Neurology:  Anticonvulsants - Controlled Failed - 06/09/2020 10:10 AM      Failed - This refill cannot be delegated      Passed - Valid encounter within last 12 months    Recent Outpatient Visits          1 month ago Type 1 diabetes mellitus with diabetic polyneuropathy [facility])    [place], [place], PA-C   7 months ago Type 1 diabetes mellitus with diabetic polyneuropathy [facility])   [place], [place], PA-C   10 months ago Type 1 diabetes mellitus with diabetic polyneuropathy [facility])   [place], [employer] [place], PA-C   1 year ago Type 1 diabetes mellitus with diabetic polyneuropathy [facility])   [place], [place], PA-C   1 year ago Type 1 diabetes mellitus with diabetic polyneuropathy [facility])   [place], Clearnce Sorrel, [place]      Future Appointments            In 1 month Burnette, Clearnce Sorrel, PA-C [employer], [place]

## 2020-06-09 NOTE — Telephone Encounter (Signed)
Requested medication (s) are due for refill today: no  Requested medication (s) are on the active medication list: yes  Last refill:  01/23/20 for a 6 month fill  Future visit scheduled: yes  Notes to clinic:  NT not delegated to refill or refused this medication.   Requested Prescriptions  Pending Prescriptions Disp Refills   pregabalin (LYRICA) 225 MG capsule [Pharmacy Med Name: PREGABALIN 225MG  CAPSULES] 90 capsule     Sig: TAKE 1 CAPSULE(225 MG) BY MOUTH AT BEDTIME      Not Delegated - Neurology:  Anticonvulsants - Controlled Failed - 06/09/2020 10:10 AM      Failed - This refill cannot be delegated      Passed - Valid encounter within last 12 months    Recent Outpatient Visits           1 month ago Type 1 diabetes mellitus with diabetic polyneuropathy [facility])   [place], [place], PA-C   7 months ago Type 1 diabetes mellitus with diabetic polyneuropathy [facility])   [facility] [place], [place], PA-C   10 months ago Type 1 diabetes mellitus with diabetic polyneuropathy [facility])   [place], [place], PA-C   1 year ago Type 1 diabetes mellitus with diabetic polyneuropathy [facility])   [place], [place], PA-C   1 year ago Type 1 diabetes mellitus with diabetic polyneuropathy [facility])   [facility] [place], Clearnce Sorrel, PA-C       Future Appointments             In 1 month Burnette, Clearnce Sorrel, PA-C [employer], [facility]             Signed Prescriptions Disp Refills   furosemide (LASIX) 20 MG tablet 90 tablet 1    Sig: TAKE 1 TABLET(20 MG) BY MOUTH DAILY      Cardiovascular:  Diuretics - Loop Passed - 06/09/2020 10:10 AM      Passed - K in normal range and within 360 days    Potassium  Date Value Ref Range Status  01/20/2020 4.7 3.5 - 5.2 mmol/L Final          Passed - Ca in normal range and within 360 days    Calcium  Date Value Ref Range Status   01/20/2020 9.6 8.7 - 10.3 mg/dL Final          Passed - Na in normal range and within 360 days    Sodium  Date Value Ref Range Status  01/20/2020 136 134 - 144 mmol/L Final          Passed - Cr in normal range and within 360 days    Creatinine, Ser  Date Value Ref Range Status  01/20/2020 0.74 0.57 - 1.00 mg/dL Final   Creatinine,U  Date Value Ref Range Status  11/21/2014 40.3 mg/dL Final          Passed - Last BP in normal range    BP Readings from Last 1 Encounters:  04/19/20 129/75          Passed - Valid encounter within last 6 months    Recent Outpatient Visits           1 month ago Type 1 diabetes mellitus with diabetic polyneuropathy [facility])   [place], [place], PA-C   7 months ago Type 1 diabetes mellitus with diabetic polyneuropathy [facility])   [facility] [place], [place], [place]  10 months ago Type 1 diabetes mellitus with diabetic polyneuropathy [facility])   [place], [place], PA-C   1 year ago Type 1 diabetes mellitus with diabetic polyneuropathy [facility])   [place], [place], PA-C   1 year ago Type 1 diabetes mellitus with diabetic polyneuropathy [facility])   [facility] [place], Clearnce Sorrel, PA-C       Future Appointments             In 1 month Burnette, Clearnce Sorrel, PA-C [place]              potassium chloride SA (KLOR-CON) 20 MEQ tablet 90 tablet 1    Sig: TAKE 1 TABLET BY MOUTH EVERY DAY. TAKE WITH FUROSEMIDE WHEN NEEDED AS DIRECTED      Endocrinology:  Minerals - Potassium Supplementation Passed - 06/09/2020 10:10 AM      Passed - K in normal range and within 360 days    Potassium  Date Value Ref Range Status  01/20/2020 4.7 3.5 - 5.2 mmol/L Final          Passed - Cr in normal range and within 360 days    Creatinine, Ser  Date Value Ref Range Status  01/20/2020 0.74 0.57 - 1.00 mg/dL Final    Creatinine,U  Date Value Ref Range Status  11/21/2014 40.3 mg/dL Final          Passed - Valid encounter within last 12 months    Recent Outpatient Visits           1 month ago Type 1 diabetes mellitus with diabetic polyneuropathy [facility])   [place], PA-C   7 months ago Type 1 diabetes mellitus with diabetic polyneuropathy [facility])   [place], [place], PA-C   10 months ago Type 1 diabetes mellitus with diabetic polyneuropathy [facility])   [place], [place], PA-C   1 year ago Type 1 diabetes mellitus with diabetic polyneuropathy [facility])   [place], [place], PA-C   1 year ago Type 1 diabetes mellitus with diabetic polyneuropathy [facility])   [facility] [place], Clearnce Sorrel, [place]       Future Appointments             In 1 month Burnette, Clearnce Sorrel, PA-C [employer], [place]

## 2020-06-11 NOTE — Telephone Encounter (Signed)
Karla Patton patient.L.O.V. was on 04/19/2020 and upcoming appointment is on 07/23/2020.

## 2020-06-11 NOTE — Telephone Encounter (Signed)
Appears that she filled 90 day supply of Lyrica on 04/23/20.  Not due for refill yet

## 2020-07-22 ENCOUNTER — Other Ambulatory Visit: Payer: Self-pay | Admitting: Physician Assistant

## 2020-07-22 DIAGNOSIS — E039 Hypothyroidism, unspecified: Secondary | ICD-10-CM

## 2020-07-22 NOTE — Telephone Encounter (Signed)
Requested Prescriptions  Pending Prescriptions Disp Refills  . levothyroxine (SYNTHROID) 100 MCG tablet [Pharmacy Med Name: LEVOTHYROXINE 0.100MG  (100MCG) TAB] 90 tablet 1    Sig: TAKE 1 TABLET BY MOUTH DAILY BEFORE BREAKFAST     Endocrinology:  Hypothyroid Agents Failed - 07/22/2020  6:37 PM      Failed - TSH needs to be rechecked within 3 months after an abnormal result. Refill until TSH is due.      Passed - TSH in normal range and within 360 days    TSH  Date Value Ref Range Status  01/20/2020 2.050 0.450 - 4.500 uIU/mL Final         Passed - Valid encounter within last 12 months    Recent Outpatient Visits          3 months ago Type 1 diabetes mellitus with diabetic polyneuropathy [facility])   [place], [place], PA-C   9 months ago Type 1 diabetes mellitus with diabetic polyneuropathy [facility])   [place], [place], [place]   12 months ago Type 1 diabetes mellitus with diabetic polyneuropathy [facility])   [place], [employer], PA-C   1 year ago Type 1 diabetes mellitus with diabetic polyneuropathy [facility])   [place], [place], PA-C   1 year ago Type 1 diabetes mellitus with diabetic polyneuropathy [facility])   [place], Clearnce Sorrel, [place]      Future Appointments            Tomorrow Burnette, Clearnce Sorrel, PA-C [facility], [place]

## 2020-07-23 ENCOUNTER — Ambulatory Visit
Admission: RE | Admit: 2020-07-23 | Discharge: 2020-07-23 | Disposition: A | Discharge status: Home / Self Care | Payer: Medicare Other | Source: Ambulatory Visit | Attending: Physician Assistant | Admitting: Physician Assistant

## 2020-07-23 ENCOUNTER — Telehealth: Payer: Self-pay

## 2020-07-23 ENCOUNTER — Ambulatory Visit
Admission: RE | Admit: 2020-07-23 | Discharge: 2020-07-23 | Disposition: A | Discharge status: Home / Self Care | Payer: Medicare Other | Attending: Physician Assistant | Admitting: Physician Assistant

## 2020-07-23 ENCOUNTER — Other Ambulatory Visit: Payer: Self-pay

## 2020-07-23 ENCOUNTER — Ambulatory Visit (INDEPENDENT_AMBULATORY_CARE_PROVIDER_SITE_OTHER): Payer: Medicare Other | Admitting: Physician Assistant

## 2020-07-23 ENCOUNTER — Encounter: Payer: Self-pay | Admitting: Physician Assistant

## 2020-07-23 VITALS — BP 148/84 | HR 67 | Temp 98.2°F | Resp 16 | Wt 197.0 lb

## 2020-07-23 DIAGNOSIS — E1042 Type 1 diabetes mellitus with diabetic polyneuropathy: Secondary | ICD-10-CM | POA: Diagnosis not present

## 2020-07-23 DIAGNOSIS — G8929 Other chronic pain: Secondary | ICD-10-CM | POA: Diagnosis not present

## 2020-07-23 DIAGNOSIS — K219 Gastro-esophageal reflux disease without esophagitis: Secondary | ICD-10-CM

## 2020-07-23 DIAGNOSIS — M25562 Pain in left knee: Secondary | ICD-10-CM | POA: Insufficient documentation

## 2020-07-23 DIAGNOSIS — M4310 Spondylolisthesis, site unspecified: Secondary | ICD-10-CM

## 2020-07-23 DIAGNOSIS — M25462 Effusion, left knee: Secondary | ICD-10-CM | POA: Diagnosis not present

## 2020-07-23 DIAGNOSIS — M25552 Pain in left hip: Secondary | ICD-10-CM

## 2020-07-23 DIAGNOSIS — M545 Low back pain: Secondary | ICD-10-CM | POA: Diagnosis not present

## 2020-07-23 DIAGNOSIS — M1712 Unilateral primary osteoarthritis, left knee: Secondary | ICD-10-CM | POA: Diagnosis not present

## 2020-07-23 LAB — POCT GLYCOSYLATED HEMOGLOBIN (HGB A1C)
Est. average glucose Bld gHb Est-mCnc: 160
Hemoglobin A1C: 7.2 % — AB (ref 4.0–5.6)

## 2020-07-23 LAB — POCT UA - MICROALBUMIN: Microalbumin Ur, POC: NEGATIVE mg/L

## 2020-07-23 MED ORDER — OMEPRAZOLE 40 MG PO CPDR: 40.0000 mg | DELAYED_RELEASE_CAPSULE | Freq: Every day | ORAL | 3 refills | Status: DC

## 2020-07-23 NOTE — Telephone Encounter (Signed)
-----   Message from Mar Daring, [place] sent at 07/23/2020  4:37 PM EDT ----- Left hip is normal.

## 2020-07-23 NOTE — Telephone Encounter (Signed)
Patient advised as directed below. 

## 2020-07-23 NOTE — Telephone Encounter (Signed)
Will send in omperazole 40mg  to take in the morning before breakfast. Has to be 30 min after her levothyroxine and before breakfast. Try for 14 days and see if symptoms improve.

## 2020-07-23 NOTE — Telephone Encounter (Signed)
Seen by patient Karla Patton on 07/23/2020 4:53 PM

## 2020-07-23 NOTE — Patient Instructions (Signed)

## 2020-07-23 NOTE — Telephone Encounter (Signed)
Patient states that she forgot to mention that she has been indigestion and she has been using TUMS but the indigestion is happening after every meal

## 2020-07-23 NOTE — Telephone Encounter (Signed)
-----   Message from Mar Daring, [place] sent at 07/23/2020  4:40 PM EDT ----- Left knee does show mild arthritic changes on the inside of the knee and a small amount of swelling. There is also incidentally noted some foreign body noted on the inside mid thigh area. Not sure exactly what that is.

## 2020-07-23 NOTE — Telephone Encounter (Signed)
-----   Message from Mar Daring, [place] sent at 07/23/2020  4:41 PM EDT ----- Lumbar spine does continue to show moderate arthritic changes and the area of L4-L5 with the anterolisthesis appears stable and unchanged.

## 2020-07-23 NOTE — Progress Notes (Signed)
Established patient visit   Patient: Karla Patton   DOB: [birth year]   69 y.o. Female  MRN: [id number] Visit Date: 07/23/2020  Today's healthcare provider: Mar Daring, PA-C   Chief Complaint  Patient presents with  . Follow-up   Subjective    HPI  Diabetes Mellitus Type I, Follow-up: Patient here for follow-up evaluation of Type 1 diabetes mellitus.   Her clinical course has been stable. She is currently taking Novolog sliding scale units and Lantus 10 units BID. Compliance with blood glucose monitoring: good. Reading have been in 144. This morning was 92. Exercise: rarely  Lipid/Cholesterol, Follow-up She is not taking any medicine at the moment and does not want to take any medicine.  Last lipid panel Other pertinent labs  Lab Results  Component Value Date   CHOL 223 (H) 01/20/2020   HDL 66 01/20/2020   LDLCALC 141 (H) 01/20/2020   TRIG 91 01/20/2020   CHOLHDL 3.4 01/20/2020   Lab Results  Component Value Date   ALT 20 01/20/2020   AST 20 01/20/2020   PLT 436 01/20/2020   TSH 2.050 01/20/2020      Symptoms: No chest pain No chest pressure/discomfort  No dyspnea No lower extremity edema  No numbness or tingling of extremity No orthopnea  No palpitations No paroxysmal nocturnal dyspnea  No speech difficulty No syncope    The 10-year ASCVD risk score Mikey Bussing DC Jr., et al., 2013) is: 21.9%  ---------------------------------------------------------------------------------------------------  Patient Active Problem List   Diagnosis Date Noted  . Lymphedema 10/30/2019  . Type 1 diabetes mellitus with diabetic nephropathy ([place]) 09/01/2018  . Type 1 diabetes mellitus with polyneuropathy ([place]) 09/01/2018  . Benign essential HTN 03/03/2018  . Precordial pain 03/03/2018  . Hyperlipidemia, mixed 02/19/2018  . Disequilibrium 02/10/2018  . Headache disorder 02/10/2018  . Osteopenia 01/13/2018  . 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 Hepatic cyst 08/28/2017  . Tinnitus of right ear 08/28/2017  . Hearing loss of right ear 08/28/2017  . Constipation 08/28/2017  . Pure hypercholesterolemia 05/29/2017  . Background diabetic retinopathy ([place]) 05/22/2015  . Hypothyroidism 10/10/2014  . Diabetic hypoglycemia ([place]) 10/10/2014  . Encounter for screening colonoscopy 03/22/2014  . Varicose veins of lower extremities with other complications 78/67/6720   Past Medical History:  Diagnosis Date  . Anxiety   . Back pain   . Diabetes mellitus without complication ([place])   . Hypothyroidism   . Thyroid disease   . Varicose vein        Medications: Outpatient Medications Prior to Visit  Medication Sig  . APPLE CIDER VINEGAR PO Take 600 mg by mouth daily.   [name] aspirin EC 81 MG tablet Take 81 mg by mouth daily.  . B COMPLEX-C PO Take by mouth daily. + vitamin c  . beta carotene 25000 UNIT capsule Take 25,000 Units by mouth daily.  . Calcium Carb-Cholecalciferol (CALCIUM PLUS D3 ABSORBABLE PO) Take 600 mg by mouth. With magnesium 300 mg, vit D 1000 IU  . Cholecalciferol (VITAMIN D-3 PO) Take 250 mcg by mouth daily. 10,000 IU   . clobetasol cream (TEMOVATE) 9.47 % Apply 1 application topically as needed (for bumps on arms/shoulders from nervous itch).   . CoenzymeQ10-Isoleucine-Glycine (CO Q-10) 100-50-25 MG TB24 Take 1,000 mg PE/L by mouth. With cinnamon  . Continuous Blood Gluc Receiver (FREESTYLE LIBRE 14 DAY READER) DEVI 1 Device by Does not apply route  4 (four) times daily -  before meals and at bedtime.  . Continuous Blood Gluc Sensor (FREESTYLE LIBRE 14 DAY SENSOR) MISC 1 Device by Does not apply route 4 (four) times daily -  before meals and at bedtime.  . CONTOUR NEXT TEST test strip CHECK BLOOD SUGAR BEFORE MEALS AND AT BEDTIME  . Ferrous Sulfate Dried (HIGH POTENCY IRON) 65 MG TABS Take 2 tablets by mouth daily.   . furosemide (LASIX) 20 MG tablet TAKE 1  TABLET(20 MG) BY MOUTH DAILY  . Horse Chestnut 300 MG CAPS Take 300 mg by mouth 2 (two) times daily.  . Insulin Pen Needle (B-D ULTRAFINE III SHORT PEN) 31G X 8 MM MISC USE AS DIRECTED FOUR TIMES DAILY, BEFORE MEALS AND AT BEDTIME  . LANTUS SOLOSTAR 100 UNIT/ML Solostar Pen ADMINISTER 10 UNITS UNDER THE SKIN TWICE DAILY (Patient taking differently: 11 Units. )  . levothyroxine (SYNTHROID) 100 MCG tablet TAKE 1 TABLET BY MOUTH DAILY BEFORE BREAKFAST  . Lysine 500 MG TABS Take 1,000 mg by mouth daily.   [name] MAGNESIUM-ZINC PO Take by mouth daily.   . Melatonin 2.5 MG CHEW Chew by mouth. gummies 1 at bedtime  . NOVOLOG FLEXPEN 100 UNIT/ML FlexPen INJECT UNDER THE SKIN AS PER SLIDING SCALE BEFORE EACH MEAL, MAY INJECT UP TO 50 UNITS PER DAY  . potassium chloride SA (KLOR-CON) 20 MEQ tablet TAKE 1 TABLET BY MOUTH EVERY DAY. TAKE WITH FUROSEMIDE WHEN NEEDED AS DIRECTED  . [DISCONTINUED] ALPRAZolam (XANAX) 0.5 MG tablet TAKE 1 TABLET(0.5 MG) BY MOUTH TWICE DAILY  . [DISCONTINUED] pregabalin (LYRICA) 225 MG capsule TAKE 1 CAPSULE(225 MG) BY MOUTH AT BEDTIME   No facility-administered medications prior to visit.    Review of Systems  Constitutional: Negative for appetite change, chills, fatigue and fever.  Respiratory: Negative for chest tightness and shortness of breath.   Cardiovascular: Negative for chest pain and palpitations.  Gastrointestinal: Negative for abdominal pain, nausea and vomiting.  Neurological: Negative for dizziness and weakness.    Last CBC Lab Results  Component Value Date   WBC 4.8 01/20/2020   HGB 12.3 01/20/2020   HCT 36.9 01/20/2020   MCV 83 01/20/2020   MCH 27.6 01/20/2020   RDW 14.0 01/20/2020   PLT 436 57/12/7791   Last metabolic panel Lab Results  Component Value Date   GLUCOSE 76 01/20/2020   NA 136 01/20/2020   K 4.7 01/20/2020   CL 94 (L) 01/20/2020   CO2 26 01/20/2020   BUN 8 01/20/2020   CREATININE 0.74 01/20/2020   GFRNONAA 84 01/20/2020   GFRAA  97 01/20/2020   CALCIUM 9.6 01/20/2020   PROT 7.5 01/20/2020   ALBUMIN 4.6 01/20/2020   LABGLOB 2.9 01/20/2020   AGRATIO 1.6 01/20/2020   BILITOT 0.3 01/20/2020   ALKPHOS 124 (H) 01/20/2020   AST 20 01/20/2020   ALT 20 01/20/2020      Objective    BP (!) 148/84 (BP Location: Left Arm, Patient Position: Sitting, Cuff Size: Normal)   Pulse 67   Temp 98.2 F (36.8 C) (Oral)   Resp 16   Wt 197 lb (89.4 kg)   BMI 32.78 kg/m  BP Readings from Last 3 Encounters:  07/23/20 (!) 148/84  04/19/20 129/75  01/20/20 134/84   Wt Readings from Last 3 Encounters:  07/23/20 197 lb (89.4 kg)  04/19/20 196 lb 9.6 oz (89.2 kg)  01/20/20 197 lb (89.4 kg)      Physical Exam Vitals reviewed.  Constitutional:  General: She is not in acute distress.    Appearance: Normal appearance. She is well-developed. She is not diaphoretic.  Neck:     Thyroid: No thyromegaly.     Vascular: No JVD.     Trachea: No tracheal deviation.  Cardiovascular:     Rate and Rhythm: Normal rate and regular rhythm.     Heart sounds: Normal heart sounds. No murmur heard.  No friction rub. No gallop.   Pulmonary:     Effort: Pulmonary effort is normal. No respiratory distress.     Breath sounds: Normal breath sounds. No wheezing or rales.  Musculoskeletal:     Cervical back: Normal range of motion and neck supple.  Lymphadenopathy:     Cervical: No cervical adenopathy.  Neurological:     Mental Status: She is alert.  Psychiatric:        Mood and Affect: Mood normal.       Results for orders placed or performed in visit on 07/23/20  POCT glycosylated hemoglobin (Hb A1C)  Result Value Ref Range   Hemoglobin A1C 7.2 (A) 4.0 - 5.6 %   Est. average glucose Bld gHb Est-mCnc 160   POCT UA - Microalbumin  Result Value Ref Range   Microalbumin Ur, POC negative mg/L    Assessment & Plan     1. Type 1 diabetes mellitus with diabetic polyneuropathy (HCC) A1c improved. Continue Lantus 11 units at bedtime  and Novolog sliding scale. F/U in 3 months.   2. Anterolisthesis H/O this with acute on chronic low back pain. Will get xray as below. I will f/u pending results.  - DG Lumbar Spine Complete; Future  3. Left hip pain New. Xray ordered as below to r/o OA. F/u pending results.  - DG Hip Unilat W OR W/O Pelvis 2-3 Views Left; Future  4. Chronic pain of left knee Will get imaging to r/o OA. Will check labs as below and f/u pending results. - DG Knee Complete 4 Views Left; Future  Return in about 3 months (around 10/23/2020) for t1DM.      Reynolds Bowl, PA-C, have reviewed all documentation for this visit. The documentation on 08/12/20 for the exam, diagnosis, procedures, and orders are all accurate and complete.   Rubye Beach  [facility] [phone] (phone) [phone] (fax)  [place]

## 2020-07-23 NOTE — Telephone Encounter (Signed)
Seen by patient Fabio Asa on 07/23/2020 4:53 PM

## 2020-07-28 ENCOUNTER — Encounter: Payer: Self-pay | Admitting: Physician Assistant

## 2020-07-28 ENCOUNTER — Other Ambulatory Visit: Payer: Self-pay | Admitting: Physician Assistant

## 2020-07-28 DIAGNOSIS — Z794 Long term (current) use of insulin: Secondary | ICD-10-CM

## 2020-07-28 DIAGNOSIS — E1140 Type 2 diabetes mellitus with diabetic neuropathy, unspecified: Secondary | ICD-10-CM

## 2020-07-28 DIAGNOSIS — F411 Generalized anxiety disorder: Secondary | ICD-10-CM

## 2020-07-28 NOTE — Telephone Encounter (Signed)
Requested medication (s) are due for refill today: yes  Requested medication (s) are on the active medication list: yes  Last refill:  01/23/20 for both meds  Future visit scheduled: yes  Notes to clinic: Both meds not delegated to NT to RF   Requested Prescriptions  Pending Prescriptions Disp Refills   ALPRAZolam (XANAX) 0.5 MG tablet [Pharmacy Med Name: ALPRAZOLAM 0.5MG  TABLETS] 60 tablet     Sig: TAKE 1 TABLET(0.5 MG) BY MOUTH TWICE DAILY      Not Delegated - Psychiatry:  Anxiolytics/Hypnotics Failed - 07/28/2020  9:06 AM      Failed - This refill cannot be delegated      Failed - Urine Drug Screen completed in last 360 days.      Passed - Valid encounter within last 6 months    Recent Outpatient Visits           5 days ago Type 1 diabetes mellitus with diabetic polyneuropathy [facility])   [place], [place], [place]   3 months ago Type 1 diabetes mellitus with diabetic polyneuropathy [facility])   [place], Anderson [place] M, [place]   9 months ago Type 1 diabetes mellitus with diabetic polyneuropathy [facility])   [facility] [place], [place], PA-C   1 year ago Type 1 diabetes mellitus with diabetic polyneuropathy [facility])   [place], [place], PA-C   1 year ago Type 1 diabetes mellitus with diabetic polyneuropathy [facility])   [place], Clearnce Sorrel, PA-C       Future Appointments             In 2 months [place], Clearnce Sorrel, PA-C [facility]              pregabalin (LYRICA) 225 MG capsule [employer] Med Name: PREGABALIN 225MG  CAPSULES] 90 capsule     Sig: TAKE 1 CAPSULE(225 MG) BY MOUTH AT BEDTIME      Not Delegated - Neurology:  Anticonvulsants - Controlled Failed - 07/28/2020  9:06 AM      Failed - This refill cannot be delegated      Passed - Valid encounter within last 12 months    Recent Outpatient Visits           5 days ago Type 1 diabetes  mellitus with diabetic polyneuropathy [facility]   [facility] [place], [place], PA-C   3 months ago Type 1 diabetes mellitus with diabetic polyneuropathy [facility])   [place], [place], [place]   9 months ago Type 1 diabetes mellitus with diabetic polyneuropathy [facility])   [place], [place], PA-C   1 year ago Type 1 diabetes mellitus with diabetic polyneuropathy [facility])   [place], [place], PA-C   1 year ago Type 1 diabetes mellitus with diabetic polyneuropathy [facility])   [place], Clearnce Sorrel, [place]       Future Appointments             In 2 months [place], Clearnce Sorrel, PA-C [facility]

## 2020-07-30 MED ORDER — PREGABALIN 225 MG PO CAPS: 225.0000 mg | ORAL_CAPSULE | Freq: Every day | ORAL | 1 refills | Status: DC

## 2020-08-09 MED ORDER — PREGABALIN 225 MG PO CAPS: 225.0000 mg | ORAL_CAPSULE | Freq: Every day | ORAL | 1 refills | Status: DC

## 2020-08-09 NOTE — Addendum Note (Signed)
Addended by: Mar Daring on: 08/09/2020 01:09 PM   Modules accepted: Orders

## 2020-08-20 DIAGNOSIS — E109 Type 1 diabetes mellitus without complications: Secondary | ICD-10-CM | POA: Diagnosis not present

## 2020-08-20 LAB — HM DIABETES EYE EXAM

## 2020-08-29 ENCOUNTER — Other Ambulatory Visit: Payer: Self-pay | Admitting: Orthopedic Surgery

## 2020-08-29 DIAGNOSIS — M4807 Spinal stenosis, lumbosacral region: Secondary | ICD-10-CM | POA: Diagnosis not present

## 2020-08-29 DIAGNOSIS — M1712 Unilateral primary osteoarthritis, left knee: Secondary | ICD-10-CM | POA: Diagnosis not present

## 2020-08-29 DIAGNOSIS — M47816 Spondylosis without myelopathy or radiculopathy, lumbar region: Secondary | ICD-10-CM | POA: Diagnosis not present

## 2020-08-29 DIAGNOSIS — M5416 Radiculopathy, lumbar region: Secondary | ICD-10-CM | POA: Diagnosis not present

## 2020-08-29 DIAGNOSIS — M4316 Spondylolisthesis, lumbar region: Secondary | ICD-10-CM | POA: Diagnosis not present

## 2020-08-31 ENCOUNTER — Encounter: Payer: Self-pay | Admitting: Physician Assistant

## 2020-08-31 DIAGNOSIS — E1042 Type 1 diabetes mellitus with diabetic polyneuropathy: Secondary | ICD-10-CM

## 2020-08-31 DIAGNOSIS — E7800 Pure hypercholesterolemia, unspecified: Secondary | ICD-10-CM

## 2020-08-31 DIAGNOSIS — I10 Essential (primary) hypertension: Secondary | ICD-10-CM

## 2020-08-31 MED ORDER — AMLODIPINE-ATORVASTATIN 2.5-10 MG PO TABS: 1.0000 | ORAL_TABLET | Freq: Every day | ORAL | 0 refills | Status: DC

## 2020-08-31 NOTE — Addendum Note (Signed)
Addended by: Mar Daring on: 08/31/2020 04:48 PM   Modules accepted: Orders

## 2020-09-05 ENCOUNTER — Other Ambulatory Visit: Payer: Self-pay | Admitting: Physician Assistant

## 2020-09-05 NOTE — Progress Notes (Signed)
error 

## 2020-09-11 DIAGNOSIS — Z23 Encounter for immunization: Secondary | ICD-10-CM | POA: Diagnosis not present

## 2020-09-11 DIAGNOSIS — D2261 Melanocytic nevi of right upper limb, including shoulder: Secondary | ICD-10-CM | POA: Diagnosis not present

## 2020-09-11 DIAGNOSIS — D2272 Melanocytic nevi of left lower limb, including hip: Secondary | ICD-10-CM | POA: Diagnosis not present

## 2020-09-11 DIAGNOSIS — D2262 Melanocytic nevi of left upper limb, including shoulder: Secondary | ICD-10-CM | POA: Diagnosis not present

## 2020-09-11 DIAGNOSIS — X32XXXA Exposure to sunlight, initial encounter: Secondary | ICD-10-CM | POA: Diagnosis not present

## 2020-09-11 DIAGNOSIS — D225 Melanocytic nevi of trunk: Secondary | ICD-10-CM | POA: Diagnosis not present

## 2020-09-11 DIAGNOSIS — L570 Actinic keratosis: Secondary | ICD-10-CM | POA: Diagnosis not present

## 2020-09-11 DIAGNOSIS — L821 Other seborrheic keratosis: Secondary | ICD-10-CM | POA: Diagnosis not present

## 2020-09-11 DIAGNOSIS — D2271 Melanocytic nevi of right lower limb, including hip: Secondary | ICD-10-CM | POA: Diagnosis not present

## 2020-09-13 ENCOUNTER — Telehealth: Payer: Self-pay

## 2020-09-13 DIAGNOSIS — M9904 Segmental and somatic dysfunction of sacral region: Secondary | ICD-10-CM | POA: Diagnosis not present

## 2020-09-13 DIAGNOSIS — M9903 Segmental and somatic dysfunction of lumbar region: Secondary | ICD-10-CM | POA: Diagnosis not present

## 2020-09-13 DIAGNOSIS — M9902 Segmental and somatic dysfunction of thoracic region: Secondary | ICD-10-CM | POA: Diagnosis not present

## 2020-09-13 DIAGNOSIS — M4603 Spinal enthesopathy, cervicothoracic region: Secondary | ICD-10-CM | POA: Diagnosis not present

## 2020-09-13 DIAGNOSIS — M5136 Other intervertebral disc degeneration, lumbar region: Secondary | ICD-10-CM | POA: Diagnosis not present

## 2020-09-13 DIAGNOSIS — M9901 Segmental and somatic dysfunction of cervical region: Secondary | ICD-10-CM | POA: Diagnosis not present

## 2020-09-13 DIAGNOSIS — M5387 Other specified dorsopathies, lumbosacral region: Secondary | ICD-10-CM | POA: Diagnosis not present

## 2020-09-13 NOTE — Telephone Encounter (Signed)
Copied from [place] [id number]. Topic: General - Inquiry >> Sep 12, 2020  2:21 PM Gillis Ends D wrote: Reason for CRM: [place] called with some follow up questions about a prior authorization. Please return the call to [phone] ref number [id number]. Please advise

## 2020-09-14 NOTE — Telephone Encounter (Signed)
This was faxed today with the questions.

## 2020-09-17 ENCOUNTER — Other Ambulatory Visit: Payer: Self-pay

## 2020-09-17 ENCOUNTER — Ambulatory Visit
Admission: RE | Admit: 2020-09-17 | Discharge: 2020-09-17 | Disposition: A | Discharge status: Home / Self Care | Payer: Medicare Other | Source: Ambulatory Visit | Attending: Orthopedic Surgery | Admitting: Orthopedic Surgery

## 2020-09-17 DIAGNOSIS — M4807 Spinal stenosis, lumbosacral region: Secondary | ICD-10-CM | POA: Insufficient documentation

## 2020-09-17 DIAGNOSIS — M545 Low back pain: Secondary | ICD-10-CM | POA: Diagnosis not present

## 2020-09-17 DIAGNOSIS — M4316 Spondylolisthesis, lumbar region: Secondary | ICD-10-CM | POA: Diagnosis not present

## 2020-10-02 DIAGNOSIS — G8929 Other chronic pain: Secondary | ICD-10-CM | POA: Diagnosis not present

## 2020-10-02 DIAGNOSIS — M47816 Spondylosis without myelopathy or radiculopathy, lumbar region: Secondary | ICD-10-CM | POA: Diagnosis not present

## 2020-10-02 DIAGNOSIS — M5441 Lumbago with sciatica, right side: Secondary | ICD-10-CM | POA: Diagnosis not present

## 2020-10-02 DIAGNOSIS — M4316 Spondylolisthesis, lumbar region: Secondary | ICD-10-CM | POA: Diagnosis not present

## 2020-10-02 DIAGNOSIS — M5442 Lumbago with sciatica, left side: Secondary | ICD-10-CM | POA: Diagnosis not present

## 2020-10-02 DIAGNOSIS — M48061 Spinal stenosis, lumbar region without neurogenic claudication: Secondary | ICD-10-CM | POA: Diagnosis not present

## 2020-10-10 DIAGNOSIS — M5387 Other specified dorsopathies, lumbosacral region: Secondary | ICD-10-CM | POA: Diagnosis not present

## 2020-10-10 DIAGNOSIS — M4603 Spinal enthesopathy, cervicothoracic region: Secondary | ICD-10-CM | POA: Diagnosis not present

## 2020-10-10 DIAGNOSIS — M9904 Segmental and somatic dysfunction of sacral region: Secondary | ICD-10-CM | POA: Diagnosis not present

## 2020-10-10 DIAGNOSIS — M9903 Segmental and somatic dysfunction of lumbar region: Secondary | ICD-10-CM | POA: Diagnosis not present

## 2020-10-10 DIAGNOSIS — M9901 Segmental and somatic dysfunction of cervical region: Secondary | ICD-10-CM | POA: Diagnosis not present

## 2020-10-10 DIAGNOSIS — M9902 Segmental and somatic dysfunction of thoracic region: Secondary | ICD-10-CM | POA: Diagnosis not present

## 2020-10-10 DIAGNOSIS — M5136 Other intervertebral disc degeneration, lumbar region: Secondary | ICD-10-CM | POA: Diagnosis not present

## 2020-10-19 ENCOUNTER — Other Ambulatory Visit: Payer: Self-pay

## 2020-10-19 ENCOUNTER — Ambulatory Visit (INDEPENDENT_AMBULATORY_CARE_PROVIDER_SITE_OTHER): Payer: Medicare Other | Admitting: Physician Assistant

## 2020-10-19 ENCOUNTER — Encounter: Payer: Self-pay | Admitting: Physician Assistant

## 2020-10-19 VITALS — BP 150/80 | HR 84 | Temp 98.1°F | Wt 200.0 lb

## 2020-10-19 DIAGNOSIS — R350 Frequency of micturition: Secondary | ICD-10-CM

## 2020-10-19 DIAGNOSIS — E103293 Type 1 diabetes mellitus with mild nonproliferative diabetic retinopathy without macular edema, bilateral: Secondary | ICD-10-CM

## 2020-10-19 DIAGNOSIS — N3090 Cystitis, unspecified without hematuria: Secondary | ICD-10-CM | POA: Diagnosis not present

## 2020-10-19 LAB — POCT URINALYSIS DIPSTICK
Bilirubin, UA: NEGATIVE
Glucose, UA: POSITIVE — AB
Ketones, UA: NEGATIVE
Nitrite, UA: POSITIVE
Protein, UA: NEGATIVE
Spec Grav, UA: 1.015 (ref 1.010–1.025)
Urobilinogen, UA: 0.2 E.U./dL
pH, UA: 6.5 (ref 5.0–8.0)

## 2020-10-19 MED ORDER — SULFAMETHOXAZOLE-TRIMETHOPRIM 800-160 MG PO TABS: 1.0000 | ORAL_TABLET | Freq: Two times a day (BID) | ORAL | 0 refills | Status: AC

## 2020-10-19 NOTE — Patient Instructions (Addendum)

## 2020-10-19 NOTE — Progress Notes (Signed)
Established patient visit   Patient: Karla Patton   DOB: 1951/03/01   69 y.o. Female  MRN: [id number] Visit Date: 10/19/2020  Today's healthcare provider: Trinna Post, PA-C   Chief Complaint  Patient presents with  . Urinary Frequency  I,Destiny Trickey M Mikel Pyon,acting as a scribe for Trinna Post, PA-C.,have documented all relevant documentation on the behalf of Trinna Post, PA-C,as directed by  Trinna Post, PA-C while in the presence of Trinna Post, PA-C.  Subjective    Urinary Frequency  This is a new problem. The current episode started in the past 7 days. The problem has been unchanged. The quality of the pain is described as burning. The pain is at a severity of 4/10. The pain is mild. There has been no fever. Associated symptoms include frequency and urgency. Pertinent negatives include no discharge, flank pain, hesitancy or nausea. Associated symptoms comments: cloudy. Treatments tried: AZO. The treatment provided mild relief.         Medications: Outpatient Medications Prior to Visit  Medication Sig  . ALPRAZolam (XANAX) 0.5 MG tablet TAKE 1 TABLET(0.5 MG) BY MOUTH TWICE DAILY  . APPLE CIDER VINEGAR PO Take 600 mg by mouth daily.   [name] aspirin EC 81 MG tablet Take 81 mg by mouth daily.  . B COMPLEX-C PO Take by mouth daily. + vitamin c  . beta carotene 25000 UNIT capsule Take 25,000 Units by mouth daily.  . Calcium Carb-Cholecalciferol (CALCIUM PLUS D3 ABSORBABLE PO) Take 600 mg by mouth. With magnesium 300 mg, vit D 1000 IU  . Cholecalciferol (VITAMIN D-3 PO) Take 250 mcg by mouth daily. 10,000 IU   . clobetasol cream (TEMOVATE) 3.29 % Apply 1 application topically as needed (for bumps on arms/shoulders from nervous itch).   . CoenzymeQ10-Isoleucine-Glycine (CO Q-10) 100-50-25 MG TB24 Take 1,000 mg PE/L by mouth. With cinnamon  . Continuous Blood Gluc Receiver (FREESTYLE LIBRE 14 DAY READER) DEVI 1 Device by Does not apply route 4 (four) times daily  -  before meals and at bedtime.  . Continuous Blood Gluc Sensor (FREESTYLE LIBRE 14 DAY SENSOR) MISC 1 Device by Does not apply route 4 (four) times daily -  before meals and at bedtime.  . CONTOUR NEXT TEST test strip CHECK BLOOD SUGAR BEFORE MEALS AND AT BEDTIME  . Ferrous Sulfate Dried (HIGH POTENCY IRON) 65 MG TABS Take 2 tablets by mouth daily.   . furosemide (LASIX) 20 MG tablet TAKE 1 TABLET(20 MG) BY MOUTH DAILY  . Insulin Pen Needle (B-D ULTRAFINE III SHORT PEN) 31G X 8 MM MISC USE AS DIRECTED FOUR TIMES DAILY, BEFORE MEALS AND AT BEDTIME  . LANTUS SOLOSTAR 100 UNIT/ML Solostar Pen ADMINISTER 10 UNITS UNDER THE SKIN TWICE DAILY (Patient taking differently: 11 Units. )  . levothyroxine (SYNTHROID) 100 MCG tablet TAKE 1 TABLET BY MOUTH DAILY BEFORE BREAKFAST  . Lysine 500 MG TABS Take 1,000 mg by mouth daily.   [name] MAGNESIUM-ZINC PO Take by mouth daily.   . Melatonin 2.5 MG CHEW Chew by mouth. gummies 1 at bedtime  . NOVOLOG FLEXPEN 100 UNIT/ML FlexPen INJECT UNDER THE SKIN AS PER SLIDING SCALE BEFORE EACH MEAL, MAY INJECT UP TO 50 UNITS PER DAY  . omeprazole (PRILOSEC) 40 MG capsule Take 1 capsule (40 mg total) by mouth daily.  . potassium chloride SA (KLOR-CON) 20 MEQ tablet TAKE 1 TABLET BY MOUTH EVERY DAY. TAKE WITH FUROSEMIDE WHEN NEEDED AS DIRECTED  . pregabalin (LYRICA)  225 MG capsule Take 1 capsule (225 mg total) by mouth daily.  [name] amlodipine-atorvastatin (CADUET) 2.5-10 MG tablet Take 1 tablet by mouth daily.  . Horse Chestnut 300 MG CAPS Take 300 mg by mouth 2 (two) times daily.   No facility-administered medications prior to visit.    Review of Systems  Constitutional: Negative.   Gastrointestinal: Negative.  Negative for abdominal distention, abdominal pain and nausea.  Genitourinary: Positive for dysuria, frequency, pelvic pain and urgency. Negative for difficulty urinating, flank pain, hesitancy, vaginal bleeding, vaginal discharge and vaginal pain.  Musculoskeletal:  Negative for back pain.      Objective    BP (!) 150/80 (BP Location: Left Arm, Patient Position: Sitting, Cuff Size: Large)   Pulse 84   Temp 98.1 F (36.7 C) (Oral)   Wt 200 lb (90.7 kg)   SpO2 99%   BMI 33.28 kg/m    Physical Exam Constitutional:      General: She is not in acute distress.    Appearance: Normal appearance. She is well-developed. She is obese. She is not diaphoretic.  Cardiovascular:     Rate and Rhythm: Normal rate and regular rhythm.     Heart sounds: Murmur heard.   Pulmonary:     Effort: Pulmonary effort is normal.     Breath sounds: Normal breath sounds.  Abdominal:     General: Bowel sounds are normal. There is no distension.     Palpations: Abdomen is soft.     Tenderness: There is abdominal tenderness in the suprapubic area. There is no guarding or rebound.  Skin:    General: Skin is warm and dry.  Neurological:     General: No focal deficit present.     Mental Status: She is alert and oriented to person, place, and time.  Psychiatric:        Mood and Affect: Mood normal.        Behavior: Behavior normal.       Results for orders placed or performed in visit on 10/19/20  POCT urinalysis dipstick  Result Value Ref Range   Color, UA Dark Yellow    Clarity, UA Cloudy    Glucose, UA Positive (A) Negative   Bilirubin, UA Negative    Ketones, UA Negative    Spec Grav, UA 1.015 1.010 - 1.025   Blood, UA Hemolyzed:Large +++    pH, UA 6.5 5.0 - 8.0   Protein, UA Negative Negative   Urobilinogen, UA 0.2 0.2 or 1.0 E.U./dL   Nitrite, UA Positive    Leukocytes, UA Moderate (2+) (A) Negative   Appearance     Odor      Assessment & Plan    1. 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 (HCC)  Well controlled.  2. Cystitis  Complicated UTI 2/2 DM. Treat as below. Counseled on return precautions.   - sulfamethoxazole-trimethoprim (BACTRIM DS) 800-160 MG tablet; Take 1 tablet by  mouth 2 (two) times daily for 3 days.  Dispense: 6 tablet; Refill: 0  3. Frequent urination  - Urine Culture - Urine Microscopic - POCT urinalysis dipstick   Return if symptoms worsen or fail to improve.      ITrinna Post, PA-C, have reviewed all documentation for this visit. The documentation on 10/19/20 for the exam, diagnosis, procedures, and orders are all accurate and complete.  The entirety of the information documented in the History of Present Illness, Review of Systems and Physical  Exam were personally obtained by me. Portions of this information were initially documented by [facility] and reviewed by me for thoroughness and accuracy.     Paulene Floor  [facility] [phone] (phone) [phone] (fax)  [place]

## 2020-10-20 LAB — URINALYSIS, MICROSCOPIC ONLY
Casts: NONE SEEN /lpf
WBC, UA: >30 /hpf — AB (ref 0–5)

## 2020-10-23 LAB — URINE CULTURE

## 2020-10-24 DIAGNOSIS — Z6832 Body mass index (BMI) 32.0-32.9, adult: Secondary | ICD-10-CM | POA: Diagnosis not present

## 2020-10-24 DIAGNOSIS — M5442 Lumbago with sciatica, left side: Secondary | ICD-10-CM | POA: Diagnosis not present

## 2020-10-24 DIAGNOSIS — R030 Elevated blood-pressure reading, without diagnosis of hypertension: Secondary | ICD-10-CM | POA: Diagnosis not present

## 2020-10-24 DIAGNOSIS — M47816 Spondylosis without myelopathy or radiculopathy, lumbar region: Secondary | ICD-10-CM | POA: Diagnosis not present

## 2020-10-24 NOTE — Progress Notes (Signed)
Established patient visit   Patient: Karla Patton   DOB: 1951/02/23   69 y.o. Female  MRN: [id number] Visit Date: 10/26/2020  Today's healthcare provider: Mar Daring, PA-C   Chief Complaint  Patient presents with  . Diabetes   Subjective    HPI  Diabetes Mellitus Type I, Follow-up: Patient here for follow-up evaluation of Type 1 diabetes mellitus.     Her clinical course has been stable. Insulin dosage reviewed with Anny suggested compliance most of the time. Associated symptoms of hyperglycemia have been none.  Associated symptoms of hypoglycemia have been none.   She is currently taking Novolog sliding scale units and Lantus 11 units at night.   Compliance with blood glucose monitoring: good.  The patient does perform independently. Rotation of sites for injection: abdominal wall Exercise: three times a week  Lab Results  Component Value Date   HGBA1C 7.7 (A) 10/26/2020    Also recently had UTI. Was treated and had symptom improvement overall. However, yesterday had pain and burning once with urination. Has since resolved.   Patient Active Problem List   Diagnosis Date Noted  . Lymphedema 10/30/2019  . Type 1 diabetes mellitus with diabetic nephropathy ([place]) 09/01/2018  . Type 1 diabetes mellitus with polyneuropathy ([place]) 09/01/2018  . Benign essential HTN 03/03/2018  . Precordial pain 03/03/2018  . Hyperlipidemia, mixed 02/19/2018  . Disequilibrium 02/10/2018  . Headache disorder 02/10/2018  . Osteopenia 01/13/2018  . 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 Hepatic cyst 08/28/2017  . Tinnitus of right ear 08/28/2017  . Hearing loss of right ear 08/28/2017  . Constipation 08/28/2017  . Pure hypercholesterolemia 05/29/2017  . Background diabetic retinopathy ([place]) 05/22/2015  . Hypothyroidism 10/10/2014  . Diabetic hypoglycemia ([place]) 10/10/2014  . Encounter for  screening colonoscopy 03/22/2014  . Varicose veins of lower extremities with other complications 11/91/4782   Past Medical History:  Diagnosis Date  . Anxiety   . Back pain   . Diabetes mellitus without complication ([place])   . Hypothyroidism   . Thyroid disease   . Varicose vein        Medications: Outpatient Medications Prior to Visit  Medication Sig  . ALPRAZolam (XANAX) 0.5 MG tablet TAKE 1 TABLET(0.5 MG) BY MOUTH TWICE DAILY  . APPLE CIDER VINEGAR PO Take 600 mg by mouth daily.   [name] aspirin EC 81 MG tablet Take 81 mg by mouth daily.  . B COMPLEX-C PO Take by mouth daily. + vitamin c  . beta carotene 25000 UNIT capsule Take 25,000 Units by mouth daily.  . Calcium Carb-Cholecalciferol (CALCIUM PLUS D3 ABSORBABLE PO) Take 600 mg by mouth. With magnesium 300 mg, vit D 1000 IU  . clobetasol cream (TEMOVATE) 9.56 % Apply 1 application topically as needed (for bumps on arms/shoulders from nervous itch).   . CoenzymeQ10-Isoleucine-Glycine (CO Q-10) 100-50-25 MG TB24 Take 1,000 mg PE/L by mouth. With cinnamon  . Continuous Blood Gluc Receiver (FREESTYLE LIBRE 14 DAY READER) DEVI 1 Device by Does not apply route 4 (four) times daily -  before meals and at bedtime.  . Continuous Blood Gluc Sensor (FREESTYLE LIBRE 14 DAY SENSOR) MISC 1 Device by Does not apply route 4 (four) times daily -  before meals and at bedtime.  . CONTOUR NEXT TEST test strip CHECK BLOOD SUGAR BEFORE MEALS AND AT BEDTIME  . Ferrous Sulfate Dried (HIGH POTENCY IRON) 65 MG TABS Take  2 tablets by mouth daily.   . furosemide (LASIX) 20 MG tablet TAKE 1 TABLET(20 MG) BY MOUTH DAILY  . Insulin Pen Needle (B-D ULTRAFINE III SHORT PEN) 31G X 8 MM MISC USE AS DIRECTED FOUR TIMES DAILY, BEFORE MEALS AND AT BEDTIME  . LANTUS SOLOSTAR 100 UNIT/ML Solostar Pen ADMINISTER 10 UNITS UNDER THE SKIN TWICE DAILY (Patient taking differently: 11 Units. )  . levothyroxine (SYNTHROID) 100 MCG tablet TAKE 1 TABLET BY MOUTH DAILY BEFORE  BREAKFAST  . Lysine 500 MG TABS Take 1,000 mg by mouth daily.   [name] MAGNESIUM-ZINC PO Take by mouth daily.   . Melatonin 2.5 MG CHEW Chew by mouth. gummies 1 at bedtime  . NOVOLOG FLEXPEN 100 UNIT/ML FlexPen INJECT UNDER THE SKIN AS PER SLIDING SCALE BEFORE EACH MEAL, MAY INJECT UP TO 50 UNITS PER DAY  . omeprazole (PRILOSEC) 40 MG capsule Take 1 capsule (40 mg total) by mouth daily.  . potassium chloride SA (KLOR-CON) 20 MEQ tablet TAKE 1 TABLET BY MOUTH EVERY DAY. TAKE WITH FUROSEMIDE WHEN NEEDED AS DIRECTED  . pregabalin (LYRICA) 225 MG capsule Take 1 capsule (225 mg total) by mouth daily.  . [DISCONTINUED] amlodipine-atorvastatin (CADUET) 2.5-10 MG tablet Take 1 tablet by mouth daily.  . [DISCONTINUED] Cholecalciferol (VITAMIN D-3 PO) Take 250 mcg by mouth daily. 10,000 IU   . [DISCONTINUED] Horse Chestnut 300 MG CAPS Take 300 mg by mouth 2 (two) times daily.   No facility-administered medications prior to visit.    Review of Systems  Constitutional: Negative.   Respiratory: Negative.   Cardiovascular: Negative.   Genitourinary: Negative.   Neurological: Negative.     Last CBC Lab Results  Component Value Date   WBC 4.8 01/20/2020   HGB 12.3 01/20/2020   HCT 36.9 01/20/2020   MCV 83 01/20/2020   MCH 27.6 01/20/2020   RDW 14.0 01/20/2020   PLT 436 79/01/4096   Last metabolic panel Lab Results  Component Value Date   GLUCOSE 76 01/20/2020   NA 136 01/20/2020   K 4.7 01/20/2020   CL 94 (L) 01/20/2020   CO2 26 01/20/2020   BUN 8 01/20/2020   CREATININE 0.74 01/20/2020   GFRNONAA 84 01/20/2020   GFRAA 97 01/20/2020   CALCIUM 9.6 01/20/2020   PROT 7.5 01/20/2020   ALBUMIN 4.6 01/20/2020   LABGLOB 2.9 01/20/2020   AGRATIO 1.6 01/20/2020   BILITOT 0.3 01/20/2020   ALKPHOS 124 (H) 01/20/2020   AST 20 01/20/2020   ALT 20 01/20/2020      Objective    BP 132/61   Pulse 74   Temp 98.6 F (37 C)   Resp 16   Ht 5\' 5"  (1.651 m)   Wt 196 lb (88.9 kg)   BMI 32.62  kg/m  BP Readings from Last 3 Encounters:  10/26/20 132/61  10/19/20 (!) 150/80  07/23/20 (!) 148/84   Wt Readings from Last 3 Encounters:  10/26/20 196 lb (88.9 kg)  10/19/20 200 lb (90.7 kg)  07/23/20 197 lb (89.4 kg)      Physical Exam Vitals reviewed.  Constitutional:      General: She is not in acute distress.    Appearance: Normal appearance. She is well-developed. She is obese. She is not ill-appearing or diaphoretic.  Neck:     Thyroid: No thyromegaly.     Vascular: No JVD.     Trachea: No tracheal deviation.  Cardiovascular:     Rate and Rhythm: Normal rate and regular rhythm.  Heart sounds: Murmur heard.  No friction rub. No gallop.   Pulmonary:     Effort: Pulmonary effort is normal. No respiratory distress.     Breath sounds: Normal breath sounds. No wheezing or rales.  Musculoskeletal:     Cervical back: Normal range of motion and neck supple.  Lymphadenopathy:     Cervical: No cervical adenopathy.  Neurological:     Mental Status: She is alert.       Results for orders placed or performed in visit on 10/26/20  POCT glycosylated hemoglobin (Hb A1C)  Result Value Ref Range   Hemoglobin A1C 7.7 (A) 4.0 - 5.6 %   HbA1c POC (<> result, manual entry)     HbA1c, POC (prediabetic range)     HbA1c, POC (controlled diabetic range)    POCT Urinalysis Dipstick  Result Value Ref Range   Color, UA light yellow    Clarity, UA Clear    Glucose, UA Negative Negative   Bilirubin, UA Negative    Ketones, UA Negative    Spec Grav, UA 1.010 1.010 - 1.025   Blood, UA Negative    pH, UA 6.0 5.0 - 8.0   Protein, UA Negative Negative   Urobilinogen, UA 0.2 0.2 or 1.0 E.U./dL   Nitrite, UA Negative    Leukocytes, UA Negative Negative   Appearance     Odor      Assessment & Plan     1. 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 Patient's meter reviewed  and average over last 90 days was 147. Will not adjust medications at this time. Patient has been working on diet changes over the last week. F/U in 3 months.   2. Dysuria UA normal.    No follow-ups on file.      Reynolds Bowl, PA-C, have reviewed all documentation for this visit. The documentation on 10/26/20 for the exam, diagnosis, procedures, and orders are all accurate and complete.   [place]ye [place]  [facility] [phone] (phone) [phone] (fax)  [place]

## 2020-10-25 ENCOUNTER — Ambulatory Visit: Payer: Self-pay | Admitting: Physician Assistant

## 2020-10-26 ENCOUNTER — Encounter: Payer: Self-pay | Admitting: Physician Assistant

## 2020-10-26 ENCOUNTER — Other Ambulatory Visit: Payer: Self-pay

## 2020-10-26 ENCOUNTER — Ambulatory Visit (INDEPENDENT_AMBULATORY_CARE_PROVIDER_SITE_OTHER): Payer: Medicare Other | Admitting: Physician Assistant

## 2020-10-26 VITALS — BP 132/61 | HR 74 | Temp 98.6°F | Resp 16 | Ht 65.0 in | Wt 196.0 lb

## 2020-10-26 DIAGNOSIS — R300 Dysuria: Secondary | ICD-10-CM | POA: Diagnosis not present

## 2020-10-26 DIAGNOSIS — E103293 Type 1 diabetes mellitus with mild nonproliferative diabetic retinopathy without macular edema, bilateral: Secondary | ICD-10-CM

## 2020-10-26 LAB — POCT URINALYSIS DIPSTICK
Bilirubin, UA: NEGATIVE
Blood, UA: NEGATIVE
Glucose, UA: NEGATIVE
Ketones, UA: NEGATIVE
Leukocytes, UA: NEGATIVE
Nitrite, UA: NEGATIVE
Protein, UA: NEGATIVE
Spec Grav, UA: 1.01 (ref 1.010–1.025)
Urobilinogen, UA: 0.2 E.U./dL
pH, UA: 6 (ref 5.0–8.0)

## 2020-10-26 LAB — POCT GLYCOSYLATED HEMOGLOBIN (HGB A1C): Hemoglobin A1C: 7.7 % — AB (ref 4.0–5.6)

## 2020-11-01 ENCOUNTER — Other Ambulatory Visit: Payer: Self-pay | Admitting: Physician Assistant

## 2020-11-01 DIAGNOSIS — E119 Type 2 diabetes mellitus without complications: Secondary | ICD-10-CM

## 2020-11-06 ENCOUNTER — Other Ambulatory Visit: Payer: Self-pay | Admitting: Physician Assistant

## 2020-11-06 DIAGNOSIS — K219 Gastro-esophageal reflux disease without esophagitis: Secondary | ICD-10-CM

## 2020-11-06 MED ORDER — OMEPRAZOLE 40 MG PO CPDR: 40.0000 mg | DELAYED_RELEASE_CAPSULE | Freq: Every day | ORAL | 1 refills | Status: DC

## 2020-11-06 NOTE — Telephone Encounter (Signed)
[facility] faxed refill request for the following medications:  90 day refill supply: omeprazole (PRILOSEC) 40 MG capsule   Please advise. Thanks, [employer]

## 2020-11-08 DIAGNOSIS — M47816 Spondylosis without myelopathy or radiculopathy, lumbar region: Secondary | ICD-10-CM | POA: Diagnosis not present

## 2020-11-28 ENCOUNTER — Telehealth: Payer: Self-pay

## 2020-11-28 NOTE — Telephone Encounter (Signed)
Copied from [place] [id number]. Topic: General - Other >> Nov 28, 2020  4:23 PM Leward Quan A wrote: Reason for CRM: [place] called in to inquire if [name] received the drug interaction form that was faxed over. She is faxing it over again please respond

## 2020-11-29 NOTE — Telephone Encounter (Signed)
As of yesterday I have not received anything

## 2020-11-30 DIAGNOSIS — M47816 Spondylosis without myelopathy or radiculopathy, lumbar region: Secondary | ICD-10-CM | POA: Diagnosis not present

## 2020-11-30 DIAGNOSIS — M5442 Lumbago with sciatica, left side: Secondary | ICD-10-CM | POA: Diagnosis not present

## 2020-12-03 DIAGNOSIS — M5136 Other intervertebral disc degeneration, lumbar region: Secondary | ICD-10-CM | POA: Diagnosis not present

## 2020-12-03 DIAGNOSIS — M4603 Spinal enthesopathy, cervicothoracic region: Secondary | ICD-10-CM | POA: Diagnosis not present

## 2020-12-03 DIAGNOSIS — M9902 Segmental and somatic dysfunction of thoracic region: Secondary | ICD-10-CM | POA: Diagnosis not present

## 2020-12-03 DIAGNOSIS — M9903 Segmental and somatic dysfunction of lumbar region: Secondary | ICD-10-CM | POA: Diagnosis not present

## 2020-12-03 DIAGNOSIS — M9904 Segmental and somatic dysfunction of sacral region: Secondary | ICD-10-CM | POA: Diagnosis not present

## 2020-12-03 DIAGNOSIS — M5387 Other specified dorsopathies, lumbosacral region: Secondary | ICD-10-CM | POA: Diagnosis not present

## 2020-12-03 DIAGNOSIS — M9901 Segmental and somatic dysfunction of cervical region: Secondary | ICD-10-CM | POA: Diagnosis not present

## 2020-12-19 DIAGNOSIS — M4603 Spinal enthesopathy, cervicothoracic region: Secondary | ICD-10-CM | POA: Diagnosis not present

## 2020-12-19 DIAGNOSIS — M9901 Segmental and somatic dysfunction of cervical region: Secondary | ICD-10-CM | POA: Diagnosis not present

## 2020-12-19 DIAGNOSIS — M5387 Other specified dorsopathies, lumbosacral region: Secondary | ICD-10-CM | POA: Diagnosis not present

## 2020-12-19 DIAGNOSIS — M9902 Segmental and somatic dysfunction of thoracic region: Secondary | ICD-10-CM | POA: Diagnosis not present

## 2020-12-19 DIAGNOSIS — M5136 Other intervertebral disc degeneration, lumbar region: Secondary | ICD-10-CM | POA: Diagnosis not present

## 2020-12-19 DIAGNOSIS — M9904 Segmental and somatic dysfunction of sacral region: Secondary | ICD-10-CM | POA: Diagnosis not present

## 2020-12-19 DIAGNOSIS — M9903 Segmental and somatic dysfunction of lumbar region: Secondary | ICD-10-CM | POA: Diagnosis not present

## 2020-12-31 ENCOUNTER — Other Ambulatory Visit: Payer: Self-pay | Admitting: Physician Assistant

## 2020-12-31 DIAGNOSIS — IMO0002 Reserved for concepts with insufficient information to code with codable children: Secondary | ICD-10-CM

## 2020-12-31 DIAGNOSIS — E11319 Type 2 diabetes mellitus with unspecified diabetic retinopathy without macular edema: Secondary | ICD-10-CM

## 2021-01-03 ENCOUNTER — Other Ambulatory Visit: Payer: Self-pay | Admitting: Physician Assistant

## 2021-01-03 DIAGNOSIS — E11319 Type 2 diabetes mellitus with unspecified diabetic retinopathy without macular edema: Secondary | ICD-10-CM

## 2021-01-03 DIAGNOSIS — IMO0002 Reserved for concepts with insufficient information to code with codable children: Secondary | ICD-10-CM

## 2021-01-04 ENCOUNTER — Encounter: Payer: Self-pay | Admitting: Physician Assistant

## 2021-01-07 MED ORDER — AMOXICILLIN-POT CLAVULANATE 875-125 MG PO TABS: 1.0000 | ORAL_TABLET | Freq: Two times a day (BID) | ORAL | 0 refills | Status: DC

## 2021-01-07 NOTE — Addendum Note (Signed)
Addended by: Mar Daring on: 01/07/2021 06:53 PM   Modules accepted: Orders

## 2021-01-18 ENCOUNTER — Other Ambulatory Visit: Payer: Self-pay | Admitting: Physician Assistant

## 2021-01-18 ENCOUNTER — Telehealth: Payer: Self-pay

## 2021-01-18 DIAGNOSIS — E039 Hypothyroidism, unspecified: Secondary | ICD-10-CM

## 2021-01-18 DIAGNOSIS — R600 Localized edema: Secondary | ICD-10-CM

## 2021-01-18 MED ORDER — POTASSIUM CHLORIDE CRYS ER 20 MEQ PO TBCR: EXTENDED_RELEASE_TABLET | ORAL | 1 refills | Status: DC

## 2021-01-18 MED ORDER — LEVOTHYROXINE SODIUM 100 MCG PO TABS: ORAL_TABLET | ORAL | 1 refills | Status: DC

## 2021-01-18 NOTE — Telephone Encounter (Signed)
[facility] refills #30 tabs given; pt has appt. 01/28/21; labs are due.  Requested Prescriptions  Pending Prescriptions Disp Refills  . furosemide (LASIX) 20 MG tablet [Pharmacy Med Name: FUROSEMIDE 20MG  TABLETS] 30 tablet 0    Sig: TAKE 1 TABLET(20 MG) BY MOUTH DAILY     Cardiovascular:  Diuretics - Loop Failed - 01/18/2021 11:03 AM      Failed - K in normal range and within 360 days    Potassium  Date Value Ref Range Status  01/20/2020 4.7 3.5 - 5.2 mmol/L Final         Failed - Ca in normal range and within 360 days    Calcium  Date Value Ref Range Status  01/20/2020 9.6 8.7 - 10.3 mg/dL Final         Failed - Na in normal range and within 360 days    Sodium  Date Value Ref Range Status  01/20/2020 136 134 - 144 mmol/L Final         Failed - Cr in normal range and within 360 days    Creatinine, Ser  Date Value Ref Range Status  01/20/2020 0.74 0.57 - 1.00 mg/dL Final   Creatinine,U  Date Value Ref Range Status  11/21/2014 40.3 mg/dL Final         Passed - Last BP in normal range    BP Readings from Last 1 Encounters:  10/26/20 132/61         Passed - Valid encounter within last 6 months    Recent Outpatient Visits          2 months ago 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 [facility])   [place], [place], PA-C   3 months ago 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 potassium chloride SA (KLOR-CON) 20 MEQ tablet [Pharmacy Med Name: POTASSIUM CL 20MEQ ER TABLETS] 30 tablet 0    Sig: TAKE 1 TABLET BY MOUTH EVERY DAY. 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 [facility])   [facility] [place], [place], [place]   3 months ago 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 [facility])   [place], [place], PA-C   5 months ago Type 1 diabetes mellitus with diabetic polyneuropathy [facility])   [place], [place], [place]   9 months ago Type 1 diabetes mellitus with diabetic polyneuropathy [facility])   [place], [place], PA-C   1 year ago Type 1 diabetes mellitus with diabetic polyneuropathy [facility])   [place] [facility] [place], Clearnce Sorrel, PA-C      Future Appointments            In 1 week Marlyn Corporal, Clearnce Sorrel, PA-C [employer], [facility]           . levothyroxine (SYNTHROID) 100 MCG tablet [employer] Med Name: LEVOTHYROXINE 0.100MG  (100MCG) TAB] 30 tablet 0    Sig: TAKE 1 TABLET BY MOUTH DAILY BEFORE BREAKFAST     Endocrinology:  Hypothyroid Agents Failed  - 01/18/2021 11:03 AM      Failed - TSH needs to be rechecked within 3 months after an abnormal result. Refill until TSH is due.      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 [facility])   [facility] [place], [place], PA-C   3 months ago 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 [facility])   [place], [place], PA-C   5 months ago Type 1 diabetes mellitus with diabetic polyneuropathy [facility])   [place], [place]   9 months ago Type 1 diabetes mellitus with diabetic polyneuropathy [facility])   [place], [place], PA-C   1 year ago Type 1 diabetes mellitus with diabetic polyneuropathy [facility])   [facility] [place], Clearnce Sorrel, [place]      Future Appointments            In 1 week Marlyn Corporal, Clearnce [place], PA-C [employer]

## 2021-01-18 NOTE — Telephone Encounter (Signed)
[facility] faxed refill request for the following medications:   Needing 90 day supply of: levothyroxine (SYNTHROID) 100 MCG tablet potassium chloride SA (KLOR-CON) 20 MEQ tablet   Please advise. Thanks, [employer]

## 2021-01-22 DIAGNOSIS — M4316 Spondylolisthesis, lumbar region: Secondary | ICD-10-CM | POA: Diagnosis not present

## 2021-01-22 DIAGNOSIS — M48062 Spinal stenosis, lumbar region with neurogenic claudication: Secondary | ICD-10-CM | POA: Diagnosis not present

## 2021-01-23 DIAGNOSIS — M5387 Other specified dorsopathies, lumbosacral region: Secondary | ICD-10-CM | POA: Diagnosis not present

## 2021-01-23 DIAGNOSIS — M5136 Other intervertebral disc degeneration, lumbar region: Secondary | ICD-10-CM | POA: Diagnosis not present

## 2021-01-23 DIAGNOSIS — M9903 Segmental and somatic dysfunction of lumbar region: Secondary | ICD-10-CM | POA: Diagnosis not present

## 2021-01-23 DIAGNOSIS — M9901 Segmental and somatic dysfunction of cervical region: Secondary | ICD-10-CM | POA: Diagnosis not present

## 2021-01-23 DIAGNOSIS — M9902 Segmental and somatic dysfunction of thoracic region: Secondary | ICD-10-CM | POA: Diagnosis not present

## 2021-01-23 DIAGNOSIS — M4603 Spinal enthesopathy, cervicothoracic region: Secondary | ICD-10-CM | POA: Diagnosis not present

## 2021-01-23 DIAGNOSIS — M9904 Segmental and somatic dysfunction of sacral region: Secondary | ICD-10-CM | POA: Diagnosis not present

## 2021-01-28 ENCOUNTER — Other Ambulatory Visit: Payer: Self-pay

## 2021-01-28 ENCOUNTER — Encounter: Payer: Self-pay | Admitting: Physician Assistant

## 2021-01-28 ENCOUNTER — Ambulatory Visit (INDEPENDENT_AMBULATORY_CARE_PROVIDER_SITE_OTHER): Payer: Medicare Other | Admitting: Physician Assistant

## 2021-01-28 VITALS — BP 149/71 | HR 82 | Temp 98.2°F | Resp 16 | Wt 194.4 lb

## 2021-01-28 DIAGNOSIS — E103293 Type 1 diabetes mellitus with mild nonproliferative diabetic retinopathy without macular edema, bilateral: Secondary | ICD-10-CM

## 2021-01-28 DIAGNOSIS — E039 Hypothyroidism, unspecified: Secondary | ICD-10-CM | POA: Diagnosis not present

## 2021-01-28 DIAGNOSIS — I10 Essential (primary) hypertension: Secondary | ICD-10-CM | POA: Diagnosis not present

## 2021-01-28 DIAGNOSIS — E7800 Pure hypercholesterolemia, unspecified: Secondary | ICD-10-CM | POA: Diagnosis not present

## 2021-01-28 LAB — POCT GLYCOSYLATED HEMOGLOBIN (HGB A1C)
Est. average glucose Bld gHb Est-mCnc: 148
Hemoglobin A1C: 6.8 % — AB (ref 4.0–5.6)

## 2021-01-28 MED ORDER — DEXCOM G6 RECEIVER DEVI: 0 refills | Status: DC

## 2021-01-28 MED ORDER — DEXCOM G6 SENSOR MISC: 5 refills | Status: DC

## 2021-01-28 MED ORDER — DEXCOM G6 TRANSMITTER MISC
0 refills | Status: DC
Start: 2021-01-28 — End: 2021-02-18

## 2021-01-28 NOTE — Patient Instructions (Signed)
Diabetes Mellitus and Nutrition, Adult When you have diabetes, or diabetes mellitus, it is very important to have healthy eating habits because your blood sugar (glucose) levels are greatly affected by what you eat and drink. Eating healthy foods in the right amounts, at about the same times every day, can help you:  Control your blood glucose.  Lower your risk of heart disease.  Improve your blood pressure.  Reach or maintain a healthy weight. What can affect my meal plan? Every person with diabetes is different, and each person has different needs for a meal plan. Your health care provider may recommend that you work with a dietitian to make a meal plan that is best for you. Your meal plan may vary depending on factors such as:  The calories you need.  The medicines you take.  Your weight.  Your blood glucose, blood pressure, and cholesterol levels.  Your activity level.  Other health conditions you have, such as heart or kidney disease. How do carbohydrates affect me? Carbohydrates, also called carbs, affect your blood glucose level more than any other type of food. Eating carbs naturally raises the amount of glucose in your blood. Carb counting is a method for keeping track of how many carbs you eat. Counting carbs is important to keep your blood glucose at a healthy level, especially if you use insulin or take certain oral diabetes medicines. It is important to know how many carbs you can safely have in each meal. This is different for every person. Your dietitian can help you calculate how many carbs you should have at each meal and for each snack. How does alcohol affect me? Alcohol can cause a sudden decrease in blood glucose (hypoglycemia), especially if you use insulin or take certain oral diabetes medicines. Hypoglycemia can be a life-threatening condition. Symptoms of hypoglycemia, such as sleepiness, dizziness, and confusion, are similar to symptoms of having too much  alcohol.  Do not drink alcohol if: ? Your health care provider tells you not to drink. ? You are pregnant, may be pregnant, or are planning to become pregnant.  If you drink alcohol: ? Do not drink on an empty stomach. ? Limit how much you use to:  0-1 drink a day for women.  0-2 drinks a day for men. ? Be aware of how much alcohol is in your drink. In the [place] one drink equals one 12 oz bottle of beer (355 mL), one 5 oz glass of wine (148 mL), or one 1 oz glass of hard liquor (44 mL). ? Keep yourself hydrated with water, diet soda, or unsweetened iced tea.  Keep in mind that regular soda, juice, and other mixers may contain a lot of sugar and must be counted as carbs. What are tips for following this plan? Reading food labels  Start by checking the serving size on the "Nutrition Facts" label of packaged foods and drinks. The amount of calories, carbs, fats, and other nutrients listed on the label is based on one serving of the item. Many items contain more than one serving per package.  Check the total grams (g) of carbs in one serving. You can calculate the number of servings of carbs in one serving by dividing the total carbs by 15. For example, if a food has 30 g of total carbs per serving, it would be equal to 2 servings of carbs.  Check the number of grams (g) of saturated fats and trans fats in one serving. Choose foods that have   a low amount or none of these fats.  Check the number of milligrams (mg) of salt (sodium) in one serving. Most people should limit total sodium intake to less than 2,300 mg per day.  Always check the nutrition information of foods labeled as "low-fat" or "nonfat." These foods may be higher in added sugar or refined carbs and should be avoided.  Talk to your dietitian to identify your daily goals for nutrients listed on the label. Shopping  Avoid buying canned, pre-made, or processed foods. These foods tend to be high in fat, sodium, and added  sugar.  Shop around the outside edge of the grocery store. This is where you will most often find fresh fruits and vegetables, bulk grains, fresh meats, and fresh dairy. Cooking  Use low-heat cooking methods, such as baking, instead of high-heat cooking methods like deep frying.  Cook using healthy oils, such as olive, canola, or sunflower oil.  Avoid cooking with butter, cream, or high-fat meats. Meal planning  Eat meals and snacks regularly, preferably at the same times every day. Avoid going long periods of time without eating.  Eat foods that are high in fiber, such as fresh fruits, vegetables, beans, and whole grains. Talk with your dietitian about how many servings of carbs you can eat at each meal.  Eat 4-6 oz (112-168 g) of lean protein each day, such as lean meat, chicken, fish, eggs, or tofu. One ounce (oz) of lean protein is equal to: ? 1 oz (28 g) of meat, chicken, or fish. ? 1 egg. ?  cup (62 g) of tofu.  Eat some foods each day that contain healthy fats, such as avocado, nuts, seeds, and fish.   What foods should I eat? Fruits Berries. Apples. Oranges. Peaches. Apricots. Plums. Grapes. Mango. Papaya. Pomegranate. Kiwi. Cherries. Vegetables Lettuce. Spinach. Leafy greens, including kale, chard, collard greens, and mustard greens. Beets. Cauliflower. Cabbage. Broccoli. Carrots. Green beans. Tomatoes. Peppers. Onions. Cucumbers. Brussels sprouts. Grains Whole grains, such as whole-wheat or whole-grain bread, crackers, tortillas, cereal, and pasta. Unsweetened oatmeal. Quinoa. Brown or wild rice. Meats and other proteins Seafood. Poultry without skin. Lean cuts of poultry and beef. Tofu. Nuts. Seeds. Dairy Low-fat or fat-free dairy products such as milk, yogurt, and cheese. The items listed above may not be a complete list of foods and beverages you can eat. Contact a dietitian for more information. What foods should I avoid? Fruits Fruits canned with  syrup. Vegetables Canned vegetables. Frozen vegetables with butter or cream sauce. Grains Refined white flour and flour products such as bread, pasta, snack foods, and cereals. Avoid all processed foods. Meats and other proteins Fatty cuts of meat. Poultry with skin. Breaded or fried meats. Processed meat. Avoid saturated fats. Dairy Full-fat yogurt, cheese, or milk. Beverages Sweetened drinks, such as soda or iced tea. The items listed above may not be a complete list of foods and beverages you should avoid. Contact a dietitian for more information. Questions to ask a health care provider  Do I need to meet with a diabetes educator?  Do I need to meet with a dietitian?  What number can I call if I have questions?  When are the best times to check my blood glucose? Where to find more information:  American Diabetes Association: diabetes.org  Academy of Nutrition and Dietetics: www.eatright.org  National Institute of Diabetes and Digestive and Kidney Diseases: www.niddk.nih.gov  Association of Diabetes Care and Education Specialists: www.diabeteseducator.org Summary  It is important to have healthy eating   habits because your blood sugar (glucose) levels are greatly affected by what you eat and drink.  A healthy meal plan will help you control your blood glucose and maintain a healthy lifestyle.  Your health care provider may recommend that you work with a dietitian to make a meal plan that is best for you.  Keep in mind that carbohydrates (carbs) and alcohol have immediate effects on your blood glucose levels. It is important to count carbs and to use alcohol carefully. This information is not intended to replace advice given to you by your health care provider. Make sure you discuss any questions you have with your health care provider. Document Revised: 11/15/2019 Document Reviewed: 11/15/2019 Elsevier Patient Education  2021 [employer]vier Inc.  

## 2021-01-28 NOTE — Progress Notes (Signed)
Established patient visit   Patient: Karla Patton   DOB: October 16, 1951   70 y.o. Female  MRN: [id number] Visit Date: 01/28/2021  Today's healthcare provider: Mar Daring, PA-C   Chief Complaint  Patient presents with  . Diabetes   Subjective    HPI  Diabetes Mellitus Type II, Follow-up  Lab Results  Component Value Date   HGBA1C 6.8 (A) 01/28/2021   HGBA1C 7.7 (A) 10/26/2020   HGBA1C 7.2 (A) 07/23/2020   Wt Readings from Last 3 Encounters:  01/28/21 194 lb 6.4 oz (88.2 kg)  10/26/20 196 lb (88.9 kg)  10/19/20 200 lb (90.7 kg)   Last seen for diabetes 3 months ago.  Management since then includes no changes. She reports excellent compliance with treatment. She is not having side effects.   Symptoms: No fatigue No foot ulcerations  No appetite changes No nausea  No paresthesia of the feet  No polydipsia  No polyuria No visual disturbances   No vomiting     Home blood sugar records: fasting range: WNL  Episodes of hypoglycemia? Yes, overnight   Current insulin regiment: sliding scale Most Recent Eye Exam: 08/20/20 Current exercise: none Current diet habits: in general, a "healthy" diet    Pertinent Labs: Lab Results  Component Value Date   CHOL 223 (H) 01/20/2020   HDL 66 01/20/2020   LDLCALC 141 (H) 01/20/2020   TRIG 91 01/20/2020   CHOLHDL 3.4 01/20/2020   Lab Results  Component Value Date   NA 136 01/20/2020   K 4.7 01/20/2020   CREATININE 0.74 01/20/2020   GFRNONAA 84 01/20/2020   GFRAA 97 01/20/2020   GLUCOSE 76 01/20/2020     ---------------------------------------------------------------------------------------------------   Patient Active Problem List   Diagnosis Date Noted  . Lymphedema 10/30/2019  . Type 1 diabetes mellitus with diabetic nephropathy ([place]) 09/01/2018  . Type 1 diabetes mellitus with polyneuropathy ([place]) 09/01/2018  . Benign essential HTN 03/03/2018  . Precordial pain 03/03/2018  . Hyperlipidemia, mixed  02/19/2018  . Disequilibrium 02/10/2018  . Headache disorder 02/10/2018  . Osteopenia 01/13/2018  . 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 Hepatic cyst 08/28/2017  . Tinnitus of right ear 08/28/2017  . Hearing loss of right ear 08/28/2017  . Constipation 08/28/2017  . Pure hypercholesterolemia 05/29/2017  . Background diabetic retinopathy ([place]) 05/22/2015  . Hypothyroidism 10/10/2014  . Diabetic hypoglycemia ([place]) 10/10/2014  . Encounter for screening colonoscopy 03/22/2014  . Varicose veins of lower extremities with other complications [id number]   Social History   Tobacco Use  . Smoking status: Never Smoker  . Smokeless tobacco: Never Used  Vaping Use  . Vaping Use: Never used  Substance Use Topics  . Alcohol use: Yes    Alcohol/week: 7.0 standard drinks    Types: 7 Shots of liquor per week    Comment: vodka 1 drink nightly  . Drug use: No   No Known Allergies     Medications: Outpatient Medications Prior to Visit  Medication Sig  . ALPRAZolam (XANAX) 0.5 MG tablet TAKE 1 TABLET(0.5 MG) BY MOUTH TWICE DAILY  . amoxicillin-clavulanate (AUGMENTIN) 875-125 MG tablet Take 1 tablet by mouth 2 (two) times daily.  . APPLE CIDER VINEGAR PO Take 600 mg by mouth daily.   [name] aspirin EC 81 MG tablet Take 81 mg by mouth daily.  . B COMPLEX-C PO Take by mouth daily. + vitamin c  . beta  carotene 25000 UNIT capsule Take 25,000 Units by mouth daily.  . Calcium Carb-Cholecalciferol (CALCIUM PLUS D3 ABSORBABLE PO) Take 600 mg by mouth. With magnesium 300 mg, vit D 1000 IU  . clobetasol cream (TEMOVATE) 4.40 % Apply 1 application topically as needed (for bumps on arms/shoulders from nervous itch).   . Continuous Blood Gluc Receiver (FREESTYLE LIBRE 14 DAY READER) DEVI 1 Device by Does not apply route 4 (four) times daily -  before meals and at bedtime.  . Continuous Blood Gluc Sensor (FREESTYLE LIBRE 14 DAY  SENSOR) MISC 1 Device by Does not apply route 4 (four) times daily -  before meals and at bedtime.  . CONTOUR NEXT TEST test strip CHECK BLOOD SUGAR BEFORE MEALS AND AT BEDTIME  . Ferrous Sulfate Dried (HIGH POTENCY IRON) 65 MG TABS Take 2 tablets by mouth daily.   . furosemide (LASIX) 20 MG tablet TAKE 1 TABLET(20 MG) BY MOUTH DAILY  . Insulin Aspart FlexPen 100 UNIT/ML SOPN INJECT SUBCUTANEOUSLY AS PER SLIDING SCALE BEFORE EACH MEAL(MAX DAILY DOSE 60 UNITS)  . Insulin Pen Needle (B-D ULTRAFINE III SHORT PEN) 31G X 8 MM MISC USE AS DIRECTED FOUR TIMES DAILY, BEFORE MEALS AND AT BEDTIME  . LANTUS SOLOSTAR 100 UNIT/ML Solostar Pen ADMINISTER 10 UNITS UNDER THE SKIN TWICE DAILY  . levothyroxine (SYNTHROID) 100 MCG tablet TAKE 1 TABLET BY MOUTH DAILY BEFORE BREAKFAST  . Lysine 500 MG TABS Take 1,000 mg by mouth daily.   [name] MAGNESIUM-ZINC PO Take by mouth daily.   . Multiple Vitamins-Minerals (ABC PLUS SENIOR ADULTS 50+ PO)   . omeprazole (PRILOSEC) 40 MG capsule Take 1 capsule (40 mg total) by mouth daily.  . potassium chloride SA (KLOR-CON) 20 MEQ tablet TAKE 1 TABLET BY MOUTH EVERY DAY. TAKE WITH FUROSEMIDE WHEN NEEDED AS DIRECTED  . pregabalin (LYRICA) 225 MG capsule Take 1 capsule (225 mg total) by mouth daily.  . Turmeric 500 MG CAPS   . [DISCONTINUED] CoenzymeQ10-Isoleucine-Glycine (CO Q-10) 100-50-25 MG TB24 Take 1,000 mg PE/L by mouth. With cinnamon  . [DISCONTINUED] Melatonin 2.5 MG CHEW Chew by mouth. gummies 1 at bedtime   No facility-administered medications prior to visit.    Review of Systems  Constitutional: Negative for appetite change and fever.  Eyes: Negative for visual disturbance.  Respiratory: Negative for cough, chest tightness and shortness of breath.   Cardiovascular: Negative for chest pain, palpitations and leg swelling.  Endocrine: Positive for cold intolerance.    Last CBC Lab Results  Component Value Date   WBC 4.8 01/20/2020   HGB 12.3 01/20/2020   HCT  36.9 01/20/2020   MCV 83 01/20/2020   MCH 27.6 01/20/2020   RDW 14.0 01/20/2020   PLT 436 01/20/2020   Last thyroid functions Lab Results  Component Value Date   TSH 2.050 01/20/2020        Objective    BP (!) 149/71 (BP Location: Left Arm, Patient Position: Sitting, Cuff Size: Large)   Pulse 82   Temp 98.2 F (36.8 C) (Oral)   Resp 16   Wt 194 lb 6.4 oz (88.2 kg)   BMI 32.35 kg/m  BP Readings from Last 3 Encounters:  01/28/21 (!) 149/71  10/26/20 132/61  10/19/20 (!) 150/80   Wt Readings from Last 3 Encounters:  01/28/21 194 lb 6.4 oz (88.2 kg)  10/26/20 196 lb (88.9 kg)  10/19/20 200 lb (90.7 kg)      Physical Exam Vitals reviewed.  Constitutional:  General: She is not in acute distress.    Appearance: Normal appearance. She is well-developed and well-nourished. She is obese. She is not ill-appearing or diaphoretic.  Neck:     Thyroid: No thyromegaly.     Vascular: No JVD.     Trachea: No tracheal deviation.  Cardiovascular:     Rate and Rhythm: Normal rate and regular rhythm.     Heart sounds: Murmur heard.   Systolic (heard over 2nd ICS RSB) murmur is present with a grade of 2/6. No friction rub. No gallop.   Pulmonary:     Effort: Pulmonary effort is normal. No respiratory distress.     Breath sounds: Normal breath sounds. No wheezing or rales.  Musculoskeletal:     Cervical back: Normal range of motion and neck supple.  Lymphadenopathy:     Cervical: No cervical adenopathy.  Neurological:     Mental Status: She is alert.     Results for orders placed or performed in visit on 01/28/21  POCT glycosylated hemoglobin (Hb A1C)  Result Value Ref Range   Hemoglobin A1C 6.8 (A) 4.0 - 5.6 %   Est. average glucose Bld gHb Est-mCnc 148     Assessment & Plan     1. 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 (HCC) A1c improved to 6.8 from 7.7. Continue medications as prescribed. F/U in 3  months.  - POCT glycosylated hemoglobin (Hb A1C) - Continuous Blood Gluc Sensor (DEXCOM G6 SENSOR) MISC; To check blood sugar as needed throughout the day; replace every 10 days  Dispense: 9 each; Refill: 5 - Continuous Blood Gluc Receiver ([place]) DEVI; To check blood sugar as needed throughout the day  Dispense: 1 each; Refill: 0 - Continuous Blood Gluc Transmit (DEXCOM G6 TRANSMITTER) MISC; To check blood sugar as needed throughout the day  Dispense: 1 each; Refill: 0 - AMB Referral to [place]  2. Hypothyroidism, unspecified type Stable. Continue Levothyroxine 155mcg. Will check labs as below and f/u pending results. - TSH - AMB Referral to [place]  3. Essential hypertension Stable. Continue Furosemide 20mg . Will check labs as below and f/u pending results. - CBC w/Diff/Platelet - Comprehensive Metabolic Panel (CMET) - Lipid Panel With LDL/HDL Ratio - AMB Referral to [place]  4. Hypercholesteremia Stable. Continue Diet control. Patient had been on atorvastatin but stopped. Will check labs as below and f/u pending results. - CBC w/Diff/Platelet - Comprehensive Metabolic Panel (CMET) - Lipid Panel With LDL/HDL Ratio - AMB Referral to [place]   Return in about 3 months (around 04/27/2021) for chronic disease f/u.      Reynolds Bowl, PA-C, have reviewed all documentation for this visit. The documentation on 01/28/21 for the exam, diagnosis, procedures, and orders are all accurate and complete.   Rubye Beach  [facility] [phone] (phone) [phone] (fax)  [place]

## 2021-01-29 LAB — CBC WITH DIFFERENTIAL/PLATELET
Basophils Absolute: 0.1 10*3/uL (ref 0.0–0.2)
Basos: 1 %
EOS (ABSOLUTE): 0.1 10*3/uL (ref 0.0–0.4)
Eos: 1 %
Hematocrit: 35.2 % (ref 34.0–46.6)
Hemoglobin: 12.1 g/dL (ref 11.1–15.9)
Immature Grans (Abs): 0 10*3/uL (ref 0.0–0.1)
Immature Granulocytes: 1 %
Lymphocytes Absolute: 1.6 10*3/uL (ref 0.7–3.1)
Lymphs: 26 %
MCH: 29.7 pg (ref 26.6–33.0)
MCHC: 34.4 g/dL (ref 31.5–35.7)
MCV: 86 fL (ref 79–97)
Monocytes Absolute: 0.7 10*3/uL (ref 0.1–0.9)
Monocytes: 11 %
Neutrophils Absolute: 3.6 10*3/uL (ref 1.4–7.0)
Neutrophils: 60 %
Platelets: 404 10*3/uL (ref 150–450)
RBC: 4.08 x10E6/uL (ref 3.77–5.28)
RDW: 12.9 % (ref 11.7–15.4)
WBC: 6 10*3/uL (ref 3.4–10.8)

## 2021-01-29 LAB — COMPREHENSIVE METABOLIC PANEL
ALT: 18 IU/L (ref 0–32)
AST: 24 IU/L (ref 0–40)
Albumin/Globulin Ratio: 1.8 (ref 1.2–2.2)
Albumin: 4.6 g/dL (ref 3.8–4.8)
Alkaline Phosphatase: 101 IU/L (ref 44–121)
BUN/Creatinine Ratio: 11 — ABNORMAL LOW (ref 12–28)
BUN: 8 mg/dL (ref 8–27)
Bilirubin Total: 0.3 mg/dL (ref 0.0–1.2)
CO2: 27 mmol/L (ref 20–29)
Calcium: 9.3 mg/dL (ref 8.7–10.3)
Chloride: 91 mmol/L — ABNORMAL LOW (ref 96–106)
Creatinine, Ser: 0.75 mg/dL (ref 0.57–1.00)
GFR calc Af Amer: 95 mL/min/{1.73_m2} (ref >59)
GFR calc non Af Amer: 82 mL/min/{1.73_m2} (ref >59)
Globulin, Total: 2.6 g/dL (ref 1.5–4.5)
Glucose: 161 mg/dL — ABNORMAL HIGH (ref 65–99)
Potassium: 4.3 mmol/L (ref 3.5–5.2)
Sodium: 130 mmol/L — ABNORMAL LOW (ref 134–144)
Total Protein: 7.2 g/dL (ref 6.0–8.5)

## 2021-01-29 LAB — LIPID PANEL WITH LDL/HDL RATIO
Cholesterol, Total: 220 mg/dL — ABNORMAL HIGH (ref 100–199)
HDL: 57 mg/dL (ref >39)
LDL Chol Calc (NIH): 142 mg/dL — ABNORMAL HIGH (ref 0–99)
LDL/HDL Ratio: 2.5 ratio (ref 0.0–3.2)
Triglycerides: 116 mg/dL (ref 0–149)
VLDL Cholesterol Cal: 21 mg/dL (ref 5–40)

## 2021-01-29 LAB — TSH: TSH: 2.38 u[IU]/mL (ref 0.450–4.500)

## 2021-01-30 ENCOUNTER — Telehealth: Payer: Self-pay | Admitting: *Deleted

## 2021-01-30 NOTE — Chronic Care Management (AMB) (Signed)
  Chronic Care Management   Note  01/30/2021 Name: ALIZEA PELL MRN: [id number] DOB: [birth year]  Karla Patton is a 70 y.o. year old female who is a primary care patient of Rubye Beach. I reached out to Fabio Asa by phone today in response to a referral sent by Ms. Archer E Ala's PCP, Mar Daring, PA-C.  Ms. Welte was given information about Chronic Care Management services today including:  1. CCM service includes personalized support from designated clinical staff supervised by her physician, including individualized plan of care and coordination with other care providers 2. 24/7 contact phone numbers for assistance for urgent and routine care needs. 3. Service will only be billed when office clinical staff spend 20 minutes or more in a month to coordinate care. 4. Only one practitioner may furnish and bill the service in a calendar month. 5. The patient may stop CCM services at any time (effective at the end of the month) by phone call to the office staff. 6. The patient will be responsible for cost sharing (co-pay) of up to 20% of the service fee (after annual deductible is met).  Patient agreed to services and verbal consent obtained.   Follow up plan: Telephone appointment with care management team member scheduled for:   RNCM on 02/21/2021 PharmD on 02/26/2021  [facility]

## 2021-02-09 ENCOUNTER — Other Ambulatory Visit: Payer: Self-pay | Admitting: Physician Assistant

## 2021-02-09 DIAGNOSIS — E1140 Type 2 diabetes mellitus with diabetic neuropathy, unspecified: Secondary | ICD-10-CM

## 2021-02-09 DIAGNOSIS — F411 Generalized anxiety disorder: Secondary | ICD-10-CM

## 2021-02-09 DIAGNOSIS — Z794 Long term (current) use of insulin: Secondary | ICD-10-CM

## 2021-02-09 DIAGNOSIS — R600 Localized edema: Secondary | ICD-10-CM

## 2021-02-09 NOTE — Telephone Encounter (Signed)
Requested medication (s) are due for refill today: Pregabalin, yes  Requested medication (s) are on the active medication list: yes  Last refill:  08/11/20  Future visit scheduled: yes  Notes to clinic:  not delegated  Requested medication (s) are due for refill today: Alprazolam, yes  Requested medication (s) are on the active medication list: yes  Last refill: 12/31/20  Future visit scheduled: yes  Notes to clinic:  not delegated        Requested Prescriptions  Pending Prescriptions Disp Refills   pregabalin (LYRICA) 225 MG capsule [Pharmacy Med Name: PREGABALIN 225MG  CAPSULES] 90 capsule     Sig: TAKE 1 CAPSULE(225 MG) BY MOUTH DAILY      Not Delegated - Neurology:  Anticonvulsants - Controlled Failed - 02/09/2021 10:45 AM      Failed - This refill cannot be delegated      Passed - Valid encounter within last 12 months    Recent Outpatient Visits           1 week ago 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 [place], [place], PA-C   9 months ago Type 1 diabetes mellitus with diabetic polyneuropathy [facility])   [place], Clearnce Sor[place], [place]       Future Appointments             In 2 months [place], Karla M, PA-C [employer], [facility]               ALPRAZolam Duanne Moron) 0.5 MG tablet [employer] Med Name: ALPRAZOLAM 0.5MG  TABLETS] 60 tablet     Sig: TAKE 1  TABLET(0.5 MG) BY MOUTH TWICE DAILY      Not Delegated - Psychiatry:  Anxiolytics/Hypnotics Failed - 02/09/2021 10:45 AM      Failed - This refill cannot be delegated      Failed - Urine Drug Screen completed in last 360 days      Passed - Valid encounter within last 6 months    Recent Outpatient Visits           1 week ago 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 [facility])   [place], [place], PA-C   3 months ago 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 [facility])   [place], [place], PA-C   3 months ago 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 [facility])   [place], PA-C   6 months ago Type 1 diabetes mellitus with diabetic polyneuropathy [facility])   [place], [place], [place]   9 months ago Type 1 diabetes mellitus with diabetic polyneuropathy [facility])   [facility] [place], Clearnce [place], [place]       Future Appointments             In 2  months Trinna Post, PA-C [employer], [facility]              Refused Prescriptions Disp Refills   furosemide (LASIX) 20 MG tablet [employer] Med Name: FUROSEMIDE 20MG  TABLETS] 90 tablet     Sig: TAKE 1 TABLET(20 MG) BY MOUTH DAILY      Cardiovascular:  Diuretics - Loop Failed - 02/09/2021 10:45 AM      Failed - Na in normal range and within 360 days    Sodium  Date Value Ref Range Status  01/28/2021 130 (L) 134 - 144 mmol/L Final          Failed - Last BP in normal range    BP Readings from Last 1 Encounters:  01/28/21 (!) 149/71          Passed - K in normal range and within 360 days    Potassium  Date Value Ref Range Status  01/28/2021 4.3 3.5 - 5.2 mmol/L Final          Passed - Ca in normal range and within 360 days     Calcium  Date Value Ref Range Status  01/28/2021 9.3 8.7 - 10.3 mg/dL Final          Passed - Cr in normal range and within 360 days    Creatinine, Ser  Date Value Ref Range Status  01/28/2021 0.75 0.57 - 1.00 mg/dL Final   Creatinine,U  Date Value Ref Range Status  11/21/2014 40.3 mg/dL Final          Passed - Valid encounter within last 6 months    Recent Outpatient Visits           1 week ago 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 [facility])   [place], [place], PA-C   3 months ago 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 [facility])   [place], [place], PA-C   3 months ago 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 [facility])   [place], [place], PA-C   6 months ago Type 1 diabetes mellitus with diabetic polyneuropathy [facility])   [place], [place], [place]   9 months ago Type 1 diabetes mellitus with diabetic polyneuropathy [facility])   [facility] [place], Clearnce [place], [place]       Future Appointments             In 2 months Terrilee Croak, Wendee Beavers, PA-C [employer], [facility]

## 2021-02-11 ENCOUNTER — Other Ambulatory Visit: Payer: Self-pay | Admitting: Neurosurgery

## 2021-02-11 MED ORDER — FUROSEMIDE 20 MG PO TABS
ORAL_TABLET | ORAL | 3 refills | Status: DC
Start: 2021-02-11 — End: 2022-03-11

## 2021-02-16 ENCOUNTER — Encounter: Payer: Self-pay | Admitting: Physician Assistant

## 2021-02-18 MED ORDER — FREESTYLE LIBRE 14 DAY SENSOR MISC: 4 refills | Status: DC

## 2021-02-21 ENCOUNTER — Ambulatory Visit (INDEPENDENT_AMBULATORY_CARE_PROVIDER_SITE_OTHER): Payer: Medicare Other

## 2021-02-21 DIAGNOSIS — I152 Hypertension secondary to endocrine disorders: Secondary | ICD-10-CM

## 2021-02-21 DIAGNOSIS — E7800 Pure hypercholesterolemia, unspecified: Secondary | ICD-10-CM

## 2021-02-21 DIAGNOSIS — E782 Mixed hyperlipidemia: Secondary | ICD-10-CM

## 2021-02-21 DIAGNOSIS — I10 Essential (primary) hypertension: Secondary | ICD-10-CM

## 2021-02-21 DIAGNOSIS — E1069 Type 1 diabetes mellitus with other specified complication: Secondary | ICD-10-CM | POA: Diagnosis not present

## 2021-02-21 DIAGNOSIS — E1059 Type 1 diabetes mellitus with other circulatory complications: Secondary | ICD-10-CM

## 2021-02-21 DIAGNOSIS — E103293 Type 1 diabetes mellitus with mild nonproliferative diabetic retinopathy without macular edema, bilateral: Secondary | ICD-10-CM | POA: Diagnosis not present

## 2021-02-21 NOTE — Chronic Care Management (AMB) (Signed)
Chronic Care Management   Initial Visit Note  02/21/2021 Name: Karla Patton MRN: [id number] DOB: May 03, 1951  Primary Care Provider: Mar Daring, PA-C Reason for referral : Chronic Care Management   Karla Patton is a 70 y.o. year old female who is a primary care patient of Karla Patton. The CCM team was consulted for assistance with chronic disease management and care coordination. A telephonic outreach was conducted today.  Review of Karla Patton' status, including review of consultants reports, relevant labs and test results was conducted today. Collaboration with appropriate care team members was performed as part of the comprehensive evaluation and provision of chronic care management services.    SDOH (Social Determinants of Health) assessments performed: Yes See Care Plan activities for detailed interventions related to SDOH  SDOH Interventions   Flowsheet Row Most Recent Value  SDOH Interventions   Food Insecurity Interventions Intervention Not Indicated  Transportation Interventions Intervention Not Indicated  Alcohol Brief Interventions/Follow-up AUDIT Score <7 follow-up not indicated       Medications: Outpatient Encounter Medications as of 02/21/2021  Medication Sig  . ALPRAZolam (XANAX) 0.5 MG tablet TAKE 1 TABLET(0.5 MG) BY MOUTH TWICE DAILY  . APPLE CIDER VINEGAR PO Take 600 mg by mouth daily.   [name] aspirin EC 81 MG tablet Take 81 mg by mouth daily.  . B COMPLEX-C PO Take by mouth daily. + vitamin c  . beta carotene 25000 UNIT capsule Take 25,000 Units by mouth daily.  . Calcium Carb-Cholecalciferol (CALCIUM PLUS D3 ABSORBABLE PO) Take 600 mg by mouth. With magnesium 300 mg, vit D 1000 IU  . Continuous Blood Gluc Sensor (FREESTYLE LIBRE 14 DAY SENSOR) MISC To check blood sugar ACHS, remove and replace every 14 days  . Ferrous Sulfate Dried (HIGH POTENCY IRON) 65 MG TABS Take 2 tablets by mouth daily.   . furosemide (LASIX) 20 MG tablet TAKE 1  TABLET(20 MG) BY MOUTH DAILY  . Insulin Aspart FlexPen 100 UNIT/ML SOPN INJECT SUBCUTANEOUSLY AS PER SLIDING SCALE BEFORE EACH MEAL(MAX DAILY DOSE 60 UNITS)  . LANTUS SOLOSTAR 100 UNIT/ML Solostar Pen ADMINISTER 10 UNITS UNDER THE SKIN TWICE DAILY  . levothyroxine (SYNTHROID) 100 MCG tablet TAKE 1 TABLET BY MOUTH DAILY BEFORE BREAKFAST  . MAGNESIUM-ZINC PO Take by mouth daily.   . Multiple Vitamins-Minerals (ABC PLUS SENIOR ADULTS 50+ PO)   . omeprazole (PRILOSEC) 40 MG capsule Take 1 capsule (40 mg total) by mouth daily.  . potassium chloride SA (KLOR-CON) 20 MEQ tablet TAKE 1 TABLET BY MOUTH EVERY DAY. TAKE WITH FUROSEMIDE WHEN NEEDED AS DIRECTED  . pregabalin (LYRICA) 225 MG capsule TAKE 1 CAPSULE(225 MG) BY MOUTH DAILY  . Turmeric 500 MG CAPS   . clobetasol cream (TEMOVATE) 2.24 % Apply 1 application topically as needed (for bumps on arms/shoulders from nervous itch).   . CONTOUR NEXT TEST test strip CHECK BLOOD SUGAR BEFORE MEALS AND AT BEDTIME  . Insulin Pen Needle (B-D ULTRAFINE III SHORT PEN) 31G X 8 MM MISC USE AS DIRECTED FOUR TIMES DAILY, BEFORE MEALS AND AT BEDTIME  . Lysine 500 MG TABS Take 1,000 mg by mouth daily.    No facility-administered encounter medications on file as of 02/21/2021.     Objective:  Patient Care Plan: Diabetes Type 1 (Adult)    Problem Identified: Glycemic Management (Diabetes, Type 1)     Long-Range Goal: Glycemic Management Optimized   Start Date: 02/21/2021  Expected End Date: 06/21/2021  Priority: High  Note:  Objective:  Lab Results  Component Value Date   HGBA1C 6.8 (A) 01/28/2021 .   Lab Results  Component Value Date   CREATININE 0.75 01/28/2021   CREATININE 0.74 01/20/2020   CREATININE 0.76 12/06/2018 .   [name] No results found for: EGFR  Current Barriers:  . Chronic Disease Management support and educational needs r/t Diabetes.  Case Manager Clinical Goal(s):  [name] Over the next 120 days, patient not require emergent care d/t complications  r/t hypoglycemia. . Over the next 120 days, patient will demonstrate adherence to prescribed treatment plan for Diabetes self management as evidenced by consistently assessing readings on her FreeStyle Libre monitoring device, taking medications as prescribed and adhering to the recommended ADA/carb modified diet.   Interventions:  . Collaboration with Mar Daring, PA-C regarding development and update of comprehensive plan of care as evidenced by provider attestation and co-signature . Inter-disciplinary care team collaboration (see longitudinal plan of care) . Reviewed medications. Encouraged to take medications and administer insulin as prescribed. She will discuss concerns regarding insulin cost with the CCM Pharmacy team.  . Provided information regarding importance of monitoring readings on [name] and rechecking with her standard glucometer for  readings outside of the established range. Reports readings have ranged from 64 to 117 mg/dl today. Reports the readings on her device has been mostly within range but she does note lower readings at night. Reports the [place] device is set to alarm, however she has noted episodes when her readings were much lower than the level indicated on the device. Encouraged to continue checking with her standard glucometer when needed being especially cautious prior to insulin doses.  . Reviewed s/sx of hypoglycemia and hyperglycemia along with recommended interventions. She is very knowledgeable of the appropriate interventions and keeps glucose tablets and fruit juice readily available. . Discussed nutritional intake. Reports doing well with her diet. She verbalized using several diabetic/carb modified foods to assist with maintaining a proper diet. . Discussed and offered referrals for available Diabetes education classes. Reports attending several DM education and nutritional classes. Declined need for additional referrals or  resources. . Discussed importance of completing recommended DM preventive care. Reports completing daily foot care as recommended. Reports completing an eye exam within the last year.   Patient Goals/Self-Care Activities Over the next 120 days, patient will:  - Self administer oral medications and insulin as prescribed - Attend all scheduled provider appointments - Assess readings on her FreeStyle device and reevaluate with her standard glucometer as needed - Adhere to prescribed ADA/carb modified - Notify provider or care management team with questions and new concerns as needed   Follow Up Plan:  -Will follow up next month   Patient Care Plan: Hypertension (Adult)    Problem Identified: Hypertension (Hypertension)     Long-Range Goal: Hypertension Monitored   Start Date: 02/21/2021  Expected End Date: 06/21/2021  Priority: High  Note:   Objective:  . Last practice recorded BP readings:  BP Readings from Last 3 Encounters:  01/28/21 (!) 149/71  10/26/20 132/61  10/19/20 (!) 150/80 .   [name] Most recent eGFR/CrCl: No results found for: EGFR  No components found for: CRCL  Lab Results  Component Value Date   CHOL 220 (H) 01/28/2021   HDL 57 01/28/2021   LDLCALC 142 (H) 01/28/2021   TRIG 116 01/28/2021   CHOLHDL 3.4 01/20/2020     Current Barriers:  . Chronic Disease Management support and educational needs r/t Hypertension and Hyperlipidemia.  Case Manager Clinical Goal(s):  [name] Over the next 120 days, patient will demonstrate improved adherence to prescribed treatment plan as evidenced by taking all medications as prescribed, monitoring and recording blood pressure and adhering to a cardiac prudent/diabetic diet.   Interventions:  . Collaboration with Mar Daring, PA-C regarding development and update of comprehensive plan of care as evidenced by provider attestation and co-signature . Inter-disciplinary care team collaboration (see longitudinal plan of  care) . Reviewed medications. Encouraged to continue taking as prescribed and notify provider if unable to tolerate prescribed regimen.  . Provided information regarding established blood pressure parameters along with indications for notifying a provider. Encouraged to monitor and record readings. . Discussed compliance with recommended cardiac prudent diet. Encouraged to read nutrition labels and avoid highly processed foods when possible. . Discussed complications of uncontrolled BP and cholesterol. Reviewed s/sx of heart attack, stroke and worsening symptoms that require immediate medical attention.   Patient Goals/Self-Care Activities: -Self-administer medications as prescribed -Attend all scheduled provider appointments -Monitor and record blood pressure -Adhere to recommended cardiac prudent/heart healthy diet -Notify provider or care management team with questions and new concerns as needed   Follow Up Plan:  -Will follow up next month     Ms. Deblois was given information about Chronic Care Management services including:  1. CCM service includes personalized support from designated clinical staff supervised by her physician, including individualized plan of care and coordination with other care providers 2. 24/7 contact phone numbers for assistance for urgent and routine care needs. 3. Service will only be billed when office clinical staff spend 20 minutes or more in a month to coordinate care. 4. Only one practitioner may furnish and bill the service in a calendar month. 5. The patient may stop CCM services at any time (effective at the end of the month) by phone call to the office staff. 6. The patient will be responsible for cost sharing (co-pay) of up to 20% of the service fee (after annual deductible is met).  Patient agreed to services and verbal consent obtained.        PLAN A member of the care management team will follow up with Ms. Lemma next month.   Cristy Friedlander [facility] Care Management [facility] [phone]

## 2021-02-22 ENCOUNTER — Telehealth: Payer: Self-pay

## 2021-02-22 NOTE — Progress Notes (Signed)
Chronic Care Management Pharmacy Assistant   Name: LAVENA LORETTO  MRN: [id number] DOB: 01/30/1951  Reason for Encounter: Medication Review/Initial question for initial visit with clinical pharmacist on 02/26/2021.  Patient Questions:  1.  Have you seen any other providers since your last visit? No  2.  Any changes in your medicines or health? No     PCP : Mar Daring, PA-C  Allergies:  No Known Allergies  Medications: Outpatient Encounter Medications as of 02/22/2021  Medication Sig  . ALPRAZolam (XANAX) 0.5 MG tablet TAKE 1 TABLET(0.5 MG) BY MOUTH TWICE DAILY  . APPLE CIDER VINEGAR PO Take 600 mg by mouth daily.   [name] aspirin EC 81 MG tablet Take 81 mg by mouth daily.  . B COMPLEX-C PO Take by mouth daily. + vitamin c  . beta carotene 25000 UNIT capsule Take 25,000 Units by mouth daily.  . Calcium Carb-Cholecalciferol (CALCIUM PLUS D3 ABSORBABLE PO) Take 600 mg by mouth. With magnesium 300 mg, vit D 1000 IU  . clobetasol cream (TEMOVATE) 4.19 % Apply 1 application topically as needed (for bumps on arms/shoulders from nervous itch).   . Continuous Blood Gluc Sensor (FREESTYLE LIBRE 14 DAY SENSOR) MISC To check blood sugar ACHS, remove and replace every 14 days  . CONTOUR NEXT TEST test strip CHECK BLOOD SUGAR BEFORE MEALS AND AT BEDTIME  . Ferrous Sulfate Dried (HIGH POTENCY IRON) 65 MG TABS Take 2 tablets by mouth daily.   . furosemide (LASIX) 20 MG tablet TAKE 1 TABLET(20 MG) BY MOUTH DAILY  . Insulin Aspart FlexPen 100 UNIT/ML SOPN INJECT SUBCUTANEOUSLY AS PER SLIDING SCALE BEFORE EACH MEAL(MAX DAILY DOSE 60 UNITS)  . Insulin Pen Needle (B-D ULTRAFINE III SHORT PEN) 31G X 8 MM MISC USE AS DIRECTED FOUR TIMES DAILY, BEFORE MEALS AND AT BEDTIME  . LANTUS SOLOSTAR 100 UNIT/ML Solostar Pen ADMINISTER 10 UNITS UNDER THE SKIN TWICE DAILY  . levothyroxine (SYNTHROID) 100 MCG tablet TAKE 1 TABLET BY MOUTH DAILY BEFORE BREAKFAST  . Lysine 500 MG TABS Take 1,000 mg by mouth  daily.   [name] MAGNESIUM-ZINC PO Take by mouth daily.   . Multiple Vitamins-Minerals (ABC PLUS SENIOR ADULTS 50+ PO)   . omeprazole (PRILOSEC) 40 MG capsule Take 1 capsule (40 mg total) by mouth daily.  . potassium chloride SA (KLOR-CON) 20 MEQ tablet TAKE 1 TABLET BY MOUTH EVERY DAY. TAKE WITH FUROSEMIDE WHEN NEEDED AS DIRECTED  . pregabalin (LYRICA) 225 MG capsule TAKE 1 CAPSULE(225 MG) BY MOUTH DAILY  . Turmeric 500 MG CAPS    No facility-administered encounter medications on file as of 02/22/2021.    Current Diagnosis: Patient Active Problem List   Diagnosis Date Noted  . Lymphedema 10/30/2019  . Type 1 diabetes mellitus with diabetic nephropathy ([place]) 09/01/2018  . Type 1 diabetes mellitus with polyneuropathy ([place]) 09/01/2018  . Benign essential HTN 03/03/2018  . Precordial pain 03/03/2018  . Hyperlipidemia, mixed 02/19/2018  . Disequilibrium 02/10/2018  . Headache disorder 02/10/2018  . Osteopenia 01/13/2018  . 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 Hepatic cyst 08/28/2017  . Tinnitus of right ear 08/28/2017  . Hearing loss of right ear 08/28/2017  . Constipation 08/28/2017  . Pure hypercholesterolemia 05/29/2017  . Background diabetic retinopathy ([place]) 05/22/2015  . Hypothyroidism 10/10/2014  . Diabetic hypoglycemia ([place]) 10/10/2014  . Encounter for screening colonoscopy 03/22/2014  . Varicose veins of lower extremities with other complications 37/90/2409  Goals Addressed   None    Have you seen any other providers since your last visit? no Any changes in your medications or health? no Any side effects from any medications? no Do you have an symptoms or problems not managed by your medications? no Any concerns about your health right now? no Has your provider asked that you check blood pressure, blood sugar, or follow special diet at home? Yes  Patient states she checks her blood pressure  at home.  Patient reports she has the freestyle Libre 14 Day Sensor. Do you get any type of exercise on a regular basis? Yes  Patient states she has a watch that tells her to walk ,and it counts her steps. Can you think of a goal you would like to reach for your health? None ID Do you have any problems getting your medications? Yes  Patient states the problem she has is her insurance not accepting the prescriptions that she gets prescribe and she has to wait for a prior Authorization approval.  Is there anything that you would like to discuss during the appointment? None ID   Please bring medications and supplements to appointment   Follow-Up:  Pharmacist Review   Bessie [place] Pharmacist Assistant [phone]

## 2021-02-25 ENCOUNTER — Telehealth: Payer: Self-pay

## 2021-02-25 DIAGNOSIS — M5136 Other intervertebral disc degeneration, lumbar region: Secondary | ICD-10-CM | POA: Diagnosis not present

## 2021-02-25 DIAGNOSIS — M9902 Segmental and somatic dysfunction of thoracic region: Secondary | ICD-10-CM | POA: Diagnosis not present

## 2021-02-25 DIAGNOSIS — M9904 Segmental and somatic dysfunction of sacral region: Secondary | ICD-10-CM | POA: Diagnosis not present

## 2021-02-25 DIAGNOSIS — M4603 Spinal enthesopathy, cervicothoracic region: Secondary | ICD-10-CM | POA: Diagnosis not present

## 2021-02-25 DIAGNOSIS — M9903 Segmental and somatic dysfunction of lumbar region: Secondary | ICD-10-CM | POA: Diagnosis not present

## 2021-02-25 DIAGNOSIS — M5387 Other specified dorsopathies, lumbosacral region: Secondary | ICD-10-CM | POA: Diagnosis not present

## 2021-02-25 DIAGNOSIS — M9901 Segmental and somatic dysfunction of cervical region: Secondary | ICD-10-CM | POA: Diagnosis not present

## 2021-02-25 NOTE — Progress Notes (Signed)
Spoke to patient to confirmed patient telephone appointment on 02/26/2021 for CCM at 12:30 pm with [name] the Clinical pharmacist.   Patient Verbalized understanding  [place] Pharmacist Assistant [phone]

## 2021-02-25 NOTE — Patient Instructions (Addendum)
Thank you for allowing the Chronic Care Management team to participate in your care. It was truly a pleasure speaking with you today.    Patient Care Plan: Diabetes Type 1 (Adult)    Problem Identified: Glycemic Management (Diabetes, Type 1)     Long-Range Goal: Glycemic Management Optimized   Start Date: 02/21/2021  Expected End Date: 06/21/2021  Priority: High  Note:   Objective:  Lab Results  Component Value Date   HGBA1C 6.8 (A) 01/28/2021 .   Lab Results  Component Value Date   CREATININE 0.75 01/28/2021   CREATININE 0.74 01/20/2020   CREATININE 0.76 12/06/2018 .   [name] No results found for: EGFR  Current Barriers:  . Chronic Disease Management support and educational needs r/t Diabetes.  Case Manager Clinical Goal(s):  [name] Over the next 120 days, patient not require emergent care d/t complications r/t hypoglycemia. . Over the next 120 days, patient will demonstrate adherence to prescribed treatment plan for Diabetes self management as evidenced by consistently assessing readings on her FreeStyle Libre monitoring device, taking medications as prescribed and adhering to the recommended ADA/carb modified diet.   Interventions:  . Collaboration with Mar Daring, PA-C regarding development and update of comprehensive plan of care as evidenced by provider attestation and co-signature . Inter-disciplinary care team collaboration (see longitudinal plan of care) . Reviewed medications. Encouraged to take medications and administer insulin as prescribed. She will discuss concerns regarding insulin cost with the CCM Pharmacy team.  . Provided information regarding importance of monitoring readings on [name] and rechecking with her standard glucometer for  readings outside of the established range. Reports readings have ranged from 64 to 117 mg/dl today. Reports the readings on her device has been mostly within range but she does note lower readings at night.  Reports the [place] device is set to alarm, however she has noted episodes when her readings were much lower than the level indicated on the device. Encouraged to continue checking with her standard glucometer when needed being especially cautious prior to insulin doses.  . Reviewed s/sx of hypoglycemia and hyperglycemia along with recommended interventions. She is very knowledgeable of the appropriate interventions and keeps glucose tablets and fruit juice readily available. . Discussed nutritional intake. Reports doing well with her diet. She verbalized using several diabetic/carb modified foods to assist with maintaining a proper diet. . Discussed and offered referrals for available Diabetes education classes. Reports attending several DM education and nutritional classes. Declined need for additional referrals or resources. . Discussed importance of completing recommended DM preventive care. Reports completing daily foot care as recommended. Reports completing an eye exam within the last year.   Patient Goals/Self-Care Activities Over the next 120 days, patient will:  - Self administer oral medications and insulin as prescribed - Attend all scheduled provider appointments - Assess readings on her FreeStyle device and reevaluate with her standard glucometer as needed - Adhere to prescribed ADA/carb modified - Notify provider or care management team with questions and new concerns as needed   Follow Up Plan:  -Will follow up next month   Patient Care Plan: Hypertension (Adult)    Problem Identified: Hypertension (Hypertension)     Long-Range Goal: Hypertension Monitored   Start Date: 02/21/2021  Expected End Date: 06/21/2021  Priority: High  Note:   Objective:  . Last practice recorded BP readings:  BP Readings from Last 3 Encounters:  01/28/21 (!) 149/71  10/26/20 132/61  10/19/20 (!) 150/80 .   [name]  Most recent eGFR/CrCl: No results found for: EGFR  No components found for: CRCL  Lab  Results  Component Value Date   CHOL 220 (H) 01/28/2021   HDL 57 01/28/2021   LDLCALC 142 (H) 01/28/2021   TRIG 116 01/28/2021   CHOLHDL 3.4 01/20/2020     Current Barriers:  . Chronic Disease Management support and educational needs r/t Hypertension and Hyperlipidemia.  Case Manager Clinical Goal(s):  [name] Over the next 120 days, patient will demonstrate improved adherence to prescribed treatment plan as evidenced by taking all medications as prescribed, monitoring and recording blood pressure and adhering to a cardiac prudent/diabetic diet.   Interventions:  . Collaboration with Mar Daring, PA-C regarding development and update of comprehensive plan of care as evidenced by provider attestation and co-signature . Inter-disciplinary care team collaboration (see longitudinal plan of care) . Reviewed medications. Encouraged to continue taking as prescribed and notify provider if unable to tolerate prescribed regimen.  . Provided information regarding established blood pressure parameters along with indications for notifying a provider. Encouraged to monitor and record readings. . Discussed compliance with recommended cardiac prudent diet. Encouraged to read nutrition labels and avoid highly processed foods when possible. . Discussed complications of uncontrolled BP and cholesterol. Reviewed s/sx of heart attack, stroke and worsening symptoms that require immediate medical attention.   Patient Goals/Self-Care Activities: -Self-administer medications as prescribed -Attend all scheduled provider appointments -Monitor and record blood pressure -Adhere to recommended cardiac prudent/heart healthy diet -Notify provider or care management team with questions and new concerns as needed   Follow Up Plan:  -Will follow up next month           Karla Patton was given information about Chronic Care Management services including:  1. CCM service includes personalized support from  designated clinical staff supervised by her physician, including individualized plan of care and coordination with other care providers 2. 24/7 contact phone numbers for assistance for urgent and routine care needs. 3. Service will only be billed when office clinical staff spend 20 minutes or more in a month to coordinate care. 4. Only one practitioner may furnish and bill the service in a calendar month. 5. The patient may stop CCM services at any time (effective at the end of the month) by phone call to the office staff. 6. The patient will be responsible for cost sharing (co-pay) of up to 20% of the service fee (after annual deductible is met).  Patient agreed to services and verbal consent obtained.      Karla Patton verbalized understanding of the information discussed during the telephonic outreach today. Decline need for mailed/printed instructions. A member of the care management team will follow up with Karla Patton next month.   Cristy Friedlander [facility] Care Management [facility] [phone]

## 2021-02-26 ENCOUNTER — Ambulatory Visit: Payer: Medicare Other

## 2021-02-26 DIAGNOSIS — E1059 Type 1 diabetes mellitus with other circulatory complications: Secondary | ICD-10-CM

## 2021-02-26 DIAGNOSIS — E1069 Type 1 diabetes mellitus with other specified complication: Secondary | ICD-10-CM | POA: Diagnosis not present

## 2021-02-26 DIAGNOSIS — E103293 Type 1 diabetes mellitus with mild nonproliferative diabetic retinopathy without macular edema, bilateral: Secondary | ICD-10-CM

## 2021-02-26 DIAGNOSIS — E782 Mixed hyperlipidemia: Secondary | ICD-10-CM

## 2021-02-26 DIAGNOSIS — I152 Hypertension secondary to endocrine disorders: Secondary | ICD-10-CM | POA: Diagnosis not present

## 2021-02-26 DIAGNOSIS — I10 Essential (primary) hypertension: Secondary | ICD-10-CM | POA: Diagnosis not present

## 2021-02-26 DIAGNOSIS — E7800 Pure hypercholesterolemia, unspecified: Secondary | ICD-10-CM | POA: Diagnosis not present

## 2021-02-26 NOTE — Progress Notes (Signed)
Chronic Care Management Pharmacy Note  03/04/2021 Name:  Karla Patton MRN:  [id number] DOB:  Jan 23, 1951  Subjective: Karla Patton is an 70 y.o. year old female who is a primary patient of Mar Daring, [place].  The CCM team was consulted for assistance with disease management and care coordination needs.    Engaged with patient by telephone for initial visit in response to provider referral for pharmacy case management and/or care coordination services.   Consent to Services:  The patient was given the following information about Chronic Care Management services today, agreed to services, and gave verbal consent: 1. CCM service includes personalized support from designated clinical staff supervised by the primary care provider, including individualized plan of care and coordination with other care providers 2. 24/7 contact phone numbers for assistance for urgent and routine care needs. 3. Service will only be billed when office clinical staff spend 20 minutes or more in a month to coordinate care. 4. Only one practitioner may furnish and bill the service in a calendar month. 5.The patient may stop CCM services at any time (effective at the end of the month) by phone call to the office staff. 6. The patient will be responsible for cost sharing (co-pay) of up to 20% of the service fee (after annual deductible is met). Patient agreed to services and consent obtained.  Patient Care Team: Rubye Beach as PCP - General (Physician Assistant) Dasher, Rayvon Char, MD (Dermatology) Kem Parkinson, MD (Ophthalmology) Bary Castilla Forest Gleason, MD as Consulting Physician (General Surgery) Anabel Bene, MD as Referring Physician (Neurology) Germaine Pomfret, [facility] (Pharmacist) Neldon Labella, RN as Case Manager  Recent office visits: 01/28/2021: Patient presented to [name], PA-C for follow-up. A1c 6.8%. LDL 142. Augmentin, CoQ10, melatonin stopped.   Recent consult  visits: None in previous 6 months  Hospital visits: None in previous 6 months  Objective:  Lab Results  Component Value Date   CREATININE 0.75 01/28/2021   BUN 8 01/28/2021   GFR 91.51 03/01/2015   GFRNONAA 82 01/28/2021   GFRAA 95 01/28/2021   NA 130 (L) 01/28/2021   K 4.3 01/28/2021   CALCIUM 9.3 01/28/2021   CO2 27 01/28/2021    Lab Results  Component Value Date/Time   HGBA1C 6.8 (A) 01/28/2021 10:43 AM   HGBA1C 7.7 (A) 10/26/2020 11:26 AM   HGBA1C 7.3 (H) 01/20/2020 11:31 AM   HGBA1C 8.9 (H) 05/31/2018 11:45 AM   GFR 91.51 03/01/2015 09:55 AM   GFR 90.01 02/26/2015 02:20 PM   MICROALBUR negative 07/23/2020 10:03 AM   MICROALBUR neg 03/07/2019 11:58 AM    Last diabetic Eye exam:  Lab Results  Component Value Date/Time   HMDIABEYEEXA No Retinopathy 08/20/2020 12:00 AM    Last diabetic Foot exam:  Lab Results  Component Value Date/Time   HMDIABFOOTEX normal 10/10/2014 12:00 AM     Lab Results  Component Value Date   CHOL 220 (H) 01/28/2021   HDL 57 01/28/2021   LDLCALC 142 (H) 01/28/2021   TRIG 116 01/28/2021   CHOLHDL 3.4 01/20/2020    Hepatic Function Latest Ref Rng & Units 01/28/2021 01/20/2020 05/31/2018  Total Protein 6.0 - 8.5 g/dL 7.2 7.5 7.0  Albumin 3.8 - 4.8 g/dL 4.6 4.6 4.5  AST 0 - 40 IU/L 24 20 24   ALT 0 - 32 IU/L 18 20 27   Alk Phosphatase 44 - 121 IU/L 101 124(H) 98  Total Bilirubin 0.0 - 1.2 mg/dL 0.3 0.3 0.5  Lab Results  Component Value Date/Time   TSH 2.380 01/28/2021 11:29 AM   TSH 2.050 01/20/2020 11:31 AM    CBC Latest Ref Rng & Units 01/28/2021 01/20/2020 12/06/2018  WBC 3.4 - 10.8 x10E3/uL 6.0 4.8 5.2  Hemoglobin 11.1 - 15.9 g/dL 12.1 12.3 12.2  Hematocrit 34.0 - 46.6 % 35.2 36.9 36.2  Platelets 150 - 450 x10E3/uL 404 436 393    Lab Results  Component Value Date/Time   VD25OH 29.3 (L) 12/06/2018 09:50 AM   VD25OH 37.7 05/29/2017 10:46 AM    Clinical ASCVD: No  The 10-year ASCVD risk score Mikey Bussing DC Jr., et al., 2013)  is: 25.3%   Values used to calculate the score:     Age: 52 years     Sex: Female     Is Non-Hispanic African American: No     Diabetic: Yes     Tobacco smoker: No     Systolic Blood Pressure: 623 mmHg     Is BP treated: Yes     HDL Cholesterol: 57 mg/dL     Total Cholesterol: 220 mg/dL    Depression screen [facility] 2/9 02/21/2021 01/28/2021 10/19/2020  Decreased Interest 0 0 0  Down, Depressed, Hopeless 0 0 0  PHQ - 2 Score 0 0 0  Altered sleeping - 1 1  Tired, decreased energy - 0 1  Change in appetite - 0 1  Feeling bad or failure about yourself  - 0 0  Trouble concentrating - 0 0  Moving slowly or fidgety/restless - 0 0  Suicidal thoughts - 0 0  PHQ-9 Score - 1 3  Difficult doing work/chores - Not difficult at all Not difficult at all      Social History   Tobacco Use  Smoking Status Never Smoker  Smokeless Tobacco Never Used   BP Readings from Last 3 Encounters:  01/28/21 (!) 149/71  10/26/20 132/61  10/19/20 (!) 150/80   Pulse Readings from Last 3 Encounters:  01/28/21 82  10/26/20 74  10/19/20 84   Wt Readings from Last 3 Encounters:  01/28/21 194 lb 6.4 oz (88.2 kg)  10/26/20 196 lb (88.9 kg)  10/19/20 200 lb (90.7 kg)   Last DEXA Scan: 01/13/18   T-Score femoral neck: -2.0  T-Score total hip: NA  T-Score lumbar spine: -1.5  T-Score forearm radius: NA  10-year probability of major osteoporotic fracture: 10.3%  10-year probability of hip fracture: 1.5%   Assessment/Interventions: Review of patient past medical history, allergies, medications, health status, including review of consultants reports, laboratory and other test data, was performed as part of comprehensive evaluation and provision of chronic care management services.   SDOH:  (Social Determinants of Health) assessments and interventions performed: Yes SDOH Interventions   Flowsheet Row Most Recent Value  SDOH Interventions   Financial Strain Interventions Other (Comment)  [PAP]      CCM Care  Plan  No Known Allergies  Medications Reviewed Today    Reviewed by Germaine Pomfret, [place] (Pharmacist) on 03/04/21 at 1515  Med List Status: <None>  Medication Order Taking? Sig Documenting Provider Last Dose Status Informant  acetaminophen (TYLENOL) 500 MG tablet [id number]  Take 1,000 mg by mouth daily as needed. [provider]  Active Self  ALPRAZolam Duanne Moron) 0.5 MG tablet [id number] Yes TAKE 1 TABLET(0.5 MG) BY MOUTH TWICE DAILY  Patient taking differently: Take 0.5 mg by mouth at bedtime.   Mar Daring, [place] Taking Active Self  APPLE CIDER VINEGAR PO [id number] Yes  Take 600 mg by mouth daily.  [provider] Taking Active Self  aspirin EC 81 MG tablet [id number] Yes Take 81 mg by mouth daily. [provider] Taking Active Self  beta carotene 25000 UNIT capsule [id number] Yes Take 25,000 Units by mouth daily. [provider] Taking Active Self  Cholecalciferol (VITAMIN D3) 250 MCG (10000 UT) capsule [id number] Yes Take 10,000 Units by mouth daily. [provider] Taking Active Self  Coenzyme Q10 (COQ10) 100 MG CAPS [id number] Yes Take 100 mg by mouth daily. With cinnamon [provider] Taking Active Self  Continuous Blood Gluc Sensor (FREESTYLE LIBRE [place]) [place] [id number]  To check blood sugar ACHS, remove and replace every 14 days Rubye Beach  Active Self  CONTOUR NEXT TEST test strip [id number]  CHECK BLOOD SUGAR BEFORE MEALS AND AT BEDTIME Mar Daring, PA-C  Active Self  Ferrous Sulfate Dried (HIGH POTENCY IRON) 65 MG TABS [id number] Yes Take 1 tablet by mouth daily. [provider] Taking Active Self  furosemide (LASIX) 20 MG tablet [id number] Yes TAKE 1 TABLET(20 MG) BY MOUTH DAILY  Patient taking differently: Take 20 mg by mouth daily. TAKE 1 TABLET(20 MG) BY MOUTH DAILY   Mar Daring, PA-C Taking Active Self  ibuprofen (ADVIL) 200 MG tablet [id number]  Take 400 mg by mouth  daily as needed for moderate pain. [provider]  Active Self  insulin aspart (NOVOLOG FLEXPEN) 100 UNIT/ML FlexPen [id number]  Inject 5-10 Units into the skin 3 (three) times daily with meals. Per sliding scale [provider]  Active Self  Insulin Pen Needle (B-D ULTRAFINE III SHORT PEN) 31G X 8 MM MISC [id number]  USE AS DIRECTED FOUR TIMES DAILY, BEFORE MEALS AND AT BEDTIME Mar Daring, [place]  Active Self  LANTUS SOLOSTAR 100 UNIT/ML Solostar Pen [id number] Yes ADMINISTER 10 UNITS UNDER THE SKIN TWICE DAILY  Patient taking differently: Inject 10 Units into the skin 2 (two) times daily.   Mar Daring, PA-C Taking Active Self  levothyroxine (SYNTHROID) 100 MCG tablet [id number] Yes TAKE 1 TABLET BY MOUTH DAILY BEFORE BREAKFAST  Patient taking differently: Take 100 mcg by mouth daily before breakfast. TAKE 1 TABLET BY MOUTH DAILY BEFORE BREAKFAST   Mar Daring, PA-C Taking Active Self  Lysine 500 MG TABS [id number] Yes Take 1,000 mg by mouth daily.  [provider] Taking Active Self  MAGNESIUM-ZINC PO [id number] Yes Take 1 tablet by mouth daily. [provider] Taking Active Self  Multiple Vitamins-Minerals (ABC PLUS SENIOR ADULTS 50+ PO) [id number] Yes Take 1 tablet by mouth daily. [provider] Taking Active Self           Med Note Michaelle Birks, Julie-Anne Torain A   Tue Feb 26, 2021  1:15 PM) Centrum Silver   naproxen sodium (ALEVE) 220 MG tablet [id number]  Take 440 mg by mouth daily as needed (pain). [provider]  Active Self  omeprazole (PRILOSEC) 40 MG capsule [id number] Yes Take 1 capsule (40 mg total) by mouth daily. Mar Daring, PA-C Taking Active Self  potassium chloride SA (KLOR-CON) 20 MEQ tablet [id number] Yes TAKE 1 TABLET BY MOUTH EVERY DAY. TAKE WITH FUROSEMIDE WHEN NEEDED AS DIRECTED  Patient taking differently: Take 20 mEq by mouth daily. TAKE 1 TABLET BY MOUTH EVERY DAY. TAKE WITH FUROSEMIDE WHEN NEEDED AS  DIRECTED   Mar Daring, PA-C Taking Active Self  pregabalin (LYRICA) 225 MG capsule [id number] Yes TAKE 1 CAPSULE(225 MG) BY  MOUTH DAILY  Patient taking differently: Take 225 mg by mouth daily.   Mar Daring, PA-C Taking Active Self  Turmeric 500 MG CAPS [id number] Yes Take 500 mg by mouth daily. [provider] Taking Active Self          Patient Active Problem List   Diagnosis Date Noted  . Lymphedema 10/30/2019  . Type 1 diabetes mellitus with diabetic nephropathy ([place]) 09/01/2018  . Type 1 diabetes mellitus with polyneuropathy ([place]) 09/01/2018  . Benign essential HTN 03/03/2018  . Precordial pain 03/03/2018  . Hyperlipidemia, mixed 02/19/2018  . Disequilibrium 02/10/2018  . Headache disorder 02/10/2018  . Osteopenia 01/13/2018  . 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 Hepatic cyst 08/28/2017  . Tinnitus of right ear 08/28/2017  . Hearing loss of right ear 08/28/2017  . Constipation 08/28/2017  . Pure hypercholesterolemia 05/29/2017  . Background diabetic retinopathy ([place]) 05/22/2015  . Hypothyroidism 10/10/2014  . Diabetic hypoglycemia ([place]) 10/10/2014  . Encounter for screening colonoscopy 03/22/2014  . Varicose veins of lower extremities with other complications 68/10/5725    Immunization History  Administered Date(s) Administered  . Influenza Split 09/02/2014  . Influenza, High Dose Seasonal PF 09/01/2018  . Influenza,inj,Quad PF,6+ Mos 08/28/2017  . Influenza-Unspecified 10/17/2016, 09/21/2019  . PFIZER(Purple Top)SARS-COV-2 Vaccination 02/01/2020, 02/22/2020  . Pneumococcal Conjugate-13 02/01/2014  . Pneumococcal Polysaccharide-23 09/02/2014, 10/19/2019  . Tdap 11/26/2016  . Zoster 02/28/2014    Conditions to be addressed/monitored:  Hypertension, Hyperlipidemia, Diabetes, Hypothyroidism and Osteopenia  Care Plan : General Pharmacy (Adult)  Updates made  by Germaine Pomfret, RPH since 03/04/2021 12:00 AM    Problem: Hypertension, Hyperlipidemia, Diabetes, Hypothyroidism and Osteopenia   Priority: High    Long-Range Goal: Patient-Specific Goal   Start Date: 02/26/2021  Expected End Date: 08/29/2021  This Visit's Progress: On track  Priority: High  Note:   Current Barriers:  . Unable to independently afford treatment regimen  Pharmacist Clinical Goal(s):  [name] Over the next 90 days, patient will maintain control of diabetes as evidenced by A1c less than 8%  through collaboration with PharmD and provider.   Interventions: . 1:1 collaboration with Mar Daring, PA-C regarding development and update of comprehensive plan of care as evidenced by provider attestation and co-signature . Inter-disciplinary care team collaboration (see longitudinal plan of care) . Comprehensive medication review performed; medication list updated in electronic medical record  Hypertension (BP goal <140/90) -Controlled -Current treatment: . Furosemide 20 mg daily  -Medications previously tried: Amlodipine, Lisinopril, Torsemide  -Current home readings: NA -Current dietary habits: Patient tries to follow a low-carb, low sodium diet -Current exercise habits: Walking -Denies hypotensive/hypertensive symptoms -Educated on Daily salt intake goal < 2300 mg; Importance of home blood pressure monitoring; -Counseled to monitor BP at home weekly, document, and provide log at future appointments -Recommended to continue current medication  Hyperlipidemia: (LDL goal < 70) -Uncontrolled -Current treatment: . None -Medications previously tried: Atorvastatin 10 mg   -Educated on Importance of limiting foods high in cholesterol; -Counseled on diet and exercise extensively  Diabetes (A1c goal <8%) -Controlled -Current medications: . Novolog up to 20 units three times daily  o 1 u per 15 g carbs, 1 unit for every 50 glucose . Lantus 10 units at bedtime, 11  units with breakfast   -Medications previously tried: NA  -Denies hypoglycemic/hyperglycemic symptoms -Patient trying to get FreeStyle Libre sensor refilled.  -Educated on Continuous glucose monitoring; Carbohydrate  counting and/or plate method -Counseled to check feet daily and get yearly eye exams -Assessed patient finances. Will start PAP for Novolog + Lantus  - Will apply for Part B coverage of FreeStyle sensors.   Osteopenia (Goal Maintain bone density and prevent fractures) -Controlled -Patient is not a candidate for pharmacologic treatment -Current treatment  . Vitamin D 10,000 units   -Medications previously tried: NA  -Recommend [phone] units of vitamin D daily. Recommend 1200 mg of calcium daily from dietary and supplemental sources. -Recommended to continue current medication  Hypothyroidism (Goal: maintain stable thyroid function) -Controlled -Current treatment  . Levothyroxine 100 mcg daily before breakfast  -Medications previously tried: NA  -Recommended to continue current medication  GERD (Goal: Minimize symptoms of heartburn or reflux) -Controlled -Current treatment  . Omeprazole 40 mg daily  -Medications previously tried: NA  -Patient reports significant reflux with single missed dose - Counseled on importance of trigger avoidance -Recommended to continue current medication  Patient Goals/Self-Care Activities . Over the next 90 days, patient will:  - check glucose at LEAST 3 times daily , document, and provide at future appointments check blood pressure weekly, document, and provide at future appointments  Follow Up Plan: Telephone follow up appointment with care management team member scheduled for: 05/31/2021 at 10:00 AM      Medication Assistance: Application for Novolog, Lantus  medication assistance program. in process.  Anticipated assistance start date 04/20/2021.  See plan of care for additional detail.  Patient's preferred pharmacy  is:  [id number] [address] Heal, [place] AT [facility] [address] [place] [address] Phone: [phone] Fax: [phone]  Uses pill box? Yes Pt endorses 100% compliance  We discussed: Current pharmacy is preferred with insurance plan and patient is satisfied with pharmacy services Patient decided to: Continue current medication management strategy  Care Plan and Follow Up Patient Decision:  Patient agrees to Care Plan and Follow-up.  Plan: Telephone follow up appointment with care management team member scheduled for:  05/31/2021 at 10:00 AM  [place] [phone]

## 2021-03-04 NOTE — Patient Instructions (Signed)
Visit Information It was great speaking with you today!  Please let me know if you have any questions about our visit.  Goals Addressed            This Visit's Progress   . Monitor and Manage My Blood Sugar-Diabetes Type 1       Timeframe:  Long-Range Goal Priority:  High Start Date: 02/26/2021                            Expected End Date: 08/29/2021                      Follow Up Date 05/20/2021    - check blood sugar at prescribed times - check blood sugar if I feel it is too high or too low - take the blood sugar meter to all doctor visits    Why is this important?    Checking your blood sugar at home helps to keep it from getting very high or very low.   Writing the results in a diary or log helps the doctor know how to care for you.   Your blood sugar log should have the time, the date and the results.   Also, write down the amount of insulin or other medicine you take.   Other information like what you ate, exercise done and how you were feeling will also be helpful..     Notes:        Patient Care Plan: Diabetes Type 1 (Adult)    Problem Identified: Glycemic Management (Diabetes, Type 1)     Long-Range Goal: Glycemic Management Optimized   Start Date: 02/21/2021  Expected End Date: 06/21/2021  Priority: High  Note:   Objective:  Lab Results  Component Value Date   HGBA1C 6.8 (A) 01/28/2021 .   Lab Results  Component Value Date   CREATININE 0.75 01/28/2021   CREATININE 0.74 01/20/2020   CREATININE 0.76 12/06/2018 .   [name] No results found for: EGFR  Current Barriers:  . Chronic Disease Management support and educational needs r/t Diabetes.  Case Manager Clinical Goal(s):  [name] Over the next 120 days, patient not require emergent care d/t complications r/t hypoglycemia. . Over the next 120 days, patient will demonstrate adherence to prescribed treatment plan for Diabetes self management as evidenced by consistently assessing readings on her FreeStyle Libre  monitoring device, taking medications as prescribed and adhering to the recommended ADA/carb modified diet.   Interventions:  . Collaboration with Mar Daring, PA-C regarding development and update of comprehensive plan of care as evidenced by provider attestation and co-signature . Inter-disciplinary care team collaboration (see longitudinal plan of care) . Reviewed medications. Encouraged to take medications and administer insulin as prescribed. She will discuss concerns regarding insulin cost with the CCM Pharmacy team.  . Provided information regarding importance of monitoring readings on [name] and rechecking with her standard glucometer for  readings outside of the established range. Reports readings have ranged from 64 to 117 mg/dl today. Reports the readings on her device has been mostly within range but she does note lower readings at night. Reports the [place] device is set to alarm, however she has noted episodes when her readings were much lower than the level indicated on the device. Encouraged to continue checking with her standard glucometer when needed being especially cautious prior to insulin doses.  . Reviewed s/sx of hypoglycemia and hyperglycemia along  with recommended interventions. She is very knowledgeable of the appropriate interventions and keeps glucose tablets and fruit juice readily available. . Discussed nutritional intake. Reports doing well with her diet. She verbalized using several diabetic/carb modified foods to assist with maintaining a proper diet. . Discussed and offered referrals for available Diabetes education classes. Reports attending several DM education and nutritional classes. Declined need for additional referrals or resources. . Discussed importance of completing recommended DM preventive care. Reports completing daily foot care as recommended. Reports completing an eye exam within the last year.   Patient Goals/Self-Care  Activities Over the next 120 days, patient will:  - Self administer oral medications and insulin as prescribed - Attend all scheduled provider appointments - Assess readings on her FreeStyle device and reevaluate with her standard glucometer as needed - Adhere to prescribed ADA/carb modified - Notify provider or care management team with questions and new concerns as needed   Follow Up Plan:  -Will follow up next month     Patient Care Plan: General Pharmacy (Adult)    Problem Identified: Hypertension, Hyperlipidemia, Diabetes, Hypothyroidism and Osteopenia   Priority: High    Long-Range Goal: Patient-Specific Goal   Start Date: 02/26/2021  Expected End Date: 08/29/2021  This Visit's Progress: On track  Priority: High  Note:   Current Barriers:  . Unable to independently afford treatment regimen  Pharmacist Clinical Goal(s):  [name] Over the next 90 days, patient will maintain control of diabetes as evidenced by A1c less than 8%  through collaboration with PharmD and provider.   Interventions: . 1:1 collaboration with Mar Daring, PA-C regarding development and update of comprehensive plan of care as evidenced by provider attestation and co-signature . Inter-disciplinary care team collaboration (see longitudinal plan of care) . Comprehensive medication review performed; medication list updated in electronic medical record  Hypertension (BP goal <140/90) -Controlled -Current treatment: . Furosemide 20 mg daily  -Medications previously tried: Amlodipine, Lisinopril, Torsemide  -Current home readings: NA -Current dietary habits: Patient tries to follow a low-carb, low sodium diet -Current exercise habits: Walking -Denies hypotensive/hypertensive symptoms -Educated on Daily salt intake goal < 2300 mg; Importance of home blood pressure monitoring; -Counseled to monitor BP at home weekly, document, and provide log at future appointments -Recommended to continue current  medication  Hyperlipidemia: (LDL goal < 70) -Uncontrolled -Current treatment: . None -Medications previously tried: Atorvastatin 10 mg   -Educated on Importance of limiting foods high in cholesterol; -Counseled on diet and exercise extensively  Diabetes (A1c goal <8%) -Controlled -Current medications: . Novolog up to 20 units three times daily  o 1 u per 15 g carbs, 1 unit for every 50 glucose . Lantus 10 units at bedtime, 11 units with breakfast   -Medications previously tried: NA  -Denies hypoglycemic/hyperglycemic symptoms -Patient trying to get FreeStyle Libre sensor refilled.  -Educated on Continuous glucose monitoring; Carbohydrate counting and/or plate method -Counseled to check feet daily and get yearly eye exams -Assessed patient finances. Will start PAP for Novolog + Lantus  - Will apply for Part B coverage of FreeStyle sensors.   Osteopenia (Goal Maintain bone density and prevent fractures) -Controlled -Patient is not a candidate for pharmacologic treatment -Current treatment  . Vitamin D 10,000 units   -Medications previously tried: NA  -Recommend [phone] units of vitamin D daily. Recommend 1200 mg of calcium daily from dietary and supplemental sources. -Recommended to continue current medication  Hypothyroidism (Goal: maintain stable thyroid function) -Controlled -Current treatment  . Levothyroxine 100 mcg  daily before breakfast  -Medications previously tried: NA  -Recommended to continue current medication  GERD (Goal: Minimize symptoms of heartburn or reflux) -Controlled -Current treatment  . Omeprazole 40 mg daily  -Medications previously tried: NA  -Patient reports significant reflux with single missed dose - Counseled on importance of trigger avoidance -Recommended to continue current medication  Patient Goals/Self-Care Activities . Over the next 90 days, patient will:  - check glucose at LEAST 3 times daily , document, and provide at future  appointments check blood pressure weekly, document, and provide at future appointments  Follow Up Plan: Telephone follow up appointment with care management team member scheduled for: 05/31/2021 at 10:00 AM     Ms. Tapscott was given information about Chronic Care Management services today including:  1. CCM service includes personalized support from designated clinical staff supervised by her physician, including individualized plan of care and coordination with other care providers 2. 24/7 contact phone numbers for assistance for urgent and routine care needs. 3. Standard insurance, coinsurance, copays and deductibles apply for chronic care management only during months in which we provide at least 20 minutes of these services. Most insurances cover these services at 100%, however patients may be responsible for any copay, coinsurance and/or deductible if applicable. This service may help you avoid the need for more expensive face-to-face services. 4. Only one practitioner may furnish and bill the service in a calendar month. 5. The patient may stop CCM services at any time (effective at the end of the month) by phone call to the office staff.  Patient agreed to services and verbal consent obtained.   The patient verbalized understanding of instructions, educational materials, and care plan provided today and declined offer to receive copy of patient instructions, educational materials, and care plan.   [place] [phone]

## 2021-03-06 ENCOUNTER — Telehealth: Payer: Self-pay

## 2021-03-06 NOTE — Chronic Care Management (AMB) (Addendum)
Chronic Care Management Pharmacy Assistant   Name: JEROME OTTER  MRN: [id number] DOB: 1951/03/03  Reason for Encounter: Patient Assistance Coordination  03/06/2021- Patient assistance forms filled out for Novolog with [employer] patient assistance program and Lantus with [employer] patient assistance program.  03/07/2021- Patient aware forms are ready and would like them placed in the mail, patient aware I will highlight all areas she will need to fill in and sign. Patient aware of income documentation needed, she will bring both forms back to PCP office for [name], PA-C and Cristie Hem to fax. [name], CPP notified.   Medications: Outpatient Encounter Medications as of 03/06/2021  Medication Sig Note  . acetaminophen (TYLENOL) 500 MG tablet Take 1,000 mg by mouth daily as needed.   . ALPRAZolam (XANAX) 0.5 MG tablet TAKE 1 TABLET(0.5 MG) BY MOUTH TWICE DAILY (Patient taking differently: Take 0.5 mg by mouth at bedtime.)   . APPLE CIDER VINEGAR PO Take 600 mg by mouth daily.    [name] aspirin EC 81 MG tablet Take 81 mg by mouth daily.   . beta carotene 25000 UNIT capsule Take 25,000 Units by mouth daily.   . Cholecalciferol (VITAMIN D3) 250 MCG (10000 UT) capsule Take 10,000 Units by mouth daily.   . Coenzyme Q10 (COQ10) 100 MG CAPS Take 100 mg by mouth daily. With cinnamon   . Continuous Blood Gluc Sensor (FREESTYLE LIBRE 14 DAY SENSOR) MISC To check blood sugar ACHS, remove and replace every 14 days   . CONTOUR NEXT TEST test strip CHECK BLOOD SUGAR BEFORE MEALS AND AT BEDTIME   . Ferrous Sulfate Dried (HIGH POTENCY IRON) 65 MG TABS Take 1 tablet by mouth daily.   . furosemide (LASIX) 20 MG tablet TAKE 1 TABLET(20 MG) BY MOUTH DAILY (Patient taking differently: Take 20 mg by mouth daily. TAKE 1 TABLET(20 MG) BY MOUTH DAILY)   . ibuprofen (ADVIL) 200 MG tablet Take 400 mg by mouth daily as needed for moderate pain.   [name] insulin aspart (NOVOLOG FLEXPEN) 100 UNIT/ML FlexPen Inject 5-10  Units into the skin 3 (three) times daily with meals. Per sliding scale   . Insulin Pen Needle (B-D ULTRAFINE III SHORT PEN) 31G X 8 MM MISC USE AS DIRECTED FOUR TIMES DAILY, BEFORE MEALS AND AT BEDTIME   . LANTUS SOLOSTAR 100 UNIT/ML Solostar Pen ADMINISTER 10 UNITS UNDER THE SKIN TWICE DAILY (Patient taking differently: Inject 10 Units into the skin 2 (two) times daily.)   . levothyroxine (SYNTHROID) 100 MCG tablet TAKE 1 TABLET BY MOUTH DAILY BEFORE BREAKFAST (Patient taking differently: Take 100 mcg by mouth daily before breakfast. TAKE 1 TABLET BY MOUTH DAILY BEFORE BREAKFAST)   . Lysine 500 MG TABS Take 1,000 mg by mouth daily.    [name] MAGNESIUM-ZINC PO Take 1 tablet by mouth daily.   . Multiple Vitamins-Minerals (ABC PLUS SENIOR ADULTS 50+ PO) Take 1 tablet by mouth daily. 02/26/2021: Centrum Silver   . naproxen sodium (ALEVE) 220 MG tablet Take 440 mg by mouth daily as needed (pain).   [name] omeprazole (PRILOSEC) 40 MG capsule Take 1 capsule (40 mg total) by mouth daily.   . potassium chloride SA (KLOR-CON) 20 MEQ tablet TAKE 1 TABLET BY MOUTH EVERY DAY. TAKE WITH FUROSEMIDE WHEN NEEDED AS DIRECTED (Patient taking differently: Take 20 mEq by mouth daily. TAKE 1 TABLET BY MOUTH EVERY DAY. TAKE WITH FUROSEMIDE WHEN NEEDED AS DIRECTED)   . pregabalin (LYRICA) 225 MG capsule TAKE 1 CAPSULE(225 MG) BY  MOUTH DAILY (Patient taking differently: Take 225 mg by mouth daily.)   . Turmeric 500 MG CAPS Take 500 mg by mouth daily.    No facility-administered encounter medications on file as of 03/06/2021.   Star Rating Drugs: Atorvastatin 10mg - Last filled 05/23/2018 for 71 day suplly at [facility]- Last filled 12/31/2020 for 62 day supply at [facility]. Novolog Flexpen- Last filled 01/18/2021 for 75 day supply at [facility] Lisinopril 10 mg- Last filled 05/01/2018 for 90 day supply at [facility].  SIG: Pattricia Boss, [place] Pharmacist Assistant [phone]  Addendum: Patient would like to  discuss getting help with a Continuous Glucose Monitor, preferably Dexcom. Patient aware that [name], CPP will be notified and he may be able to sign her up for an assistance program for Dexcom. Patient would like reading material on this product, will place patient material in the mail. Pattricia Boss, [place] Pharmacist Assistant [phone]

## 2021-03-13 ENCOUNTER — Encounter: Payer: Self-pay | Admitting: Physician Assistant

## 2021-03-13 ENCOUNTER — Other Ambulatory Visit: Payer: Self-pay | Admitting: Physician Assistant

## 2021-03-13 ENCOUNTER — Telehealth: Payer: Self-pay

## 2021-03-13 DIAGNOSIS — IMO0002 Reserved for concepts with insufficient information to code with codable children: Secondary | ICD-10-CM

## 2021-03-13 DIAGNOSIS — E11649 Type 2 diabetes mellitus with hypoglycemia without coma: Secondary | ICD-10-CM

## 2021-03-13 DIAGNOSIS — E1021 Type 1 diabetes mellitus with diabetic nephropathy: Secondary | ICD-10-CM

## 2021-03-13 DIAGNOSIS — E11319 Type 2 diabetes mellitus with unspecified diabetic retinopathy without macular edema: Secondary | ICD-10-CM

## 2021-03-13 DIAGNOSIS — E113299 Type 2 diabetes mellitus with mild nonproliferative diabetic retinopathy without macular edema, unspecified eye: Secondary | ICD-10-CM

## 2021-03-13 DIAGNOSIS — E103293 Type 1 diabetes mellitus with mild nonproliferative diabetic retinopathy without macular edema, bilateral: Secondary | ICD-10-CM

## 2021-03-13 MED ORDER — LANTUS SOLOSTAR 100 UNIT/ML ~~LOC~~ SOPN: 10.0000 [IU] | PEN_INJECTOR | Freq: Two times a day (BID) | SUBCUTANEOUS | 4 refills | Status: DC

## 2021-03-13 NOTE — Telephone Encounter (Signed)
Yes I can. Referral placed

## 2021-03-13 NOTE — Telephone Encounter (Signed)
Patient would like to know if Tawanna Sat agree to  refer her to Endocrine about managing her Diabetes since Tawanna Sat is leaving and she is going to see someone else? Would it be best to see endocrine for this?

## 2021-03-14 ENCOUNTER — Other Ambulatory Visit: Payer: Self-pay

## 2021-03-14 ENCOUNTER — Other Ambulatory Visit
Admission: RE | Admit: 2021-03-14 | Discharge: 2021-03-14 | Disposition: A | Discharge status: Home / Self Care | Payer: Medicare Other | Source: Ambulatory Visit | Attending: Neurosurgery | Admitting: Neurosurgery

## 2021-03-14 ENCOUNTER — Inpatient Hospital Stay: Admission: RE | Admit: 2021-03-14 | Payer: Medicare Other | Source: Ambulatory Visit

## 2021-03-14 DIAGNOSIS — Z20822 Contact with and (suspected) exposure to covid-19: Secondary | ICD-10-CM | POA: Insufficient documentation

## 2021-03-14 DIAGNOSIS — Z01812 Encounter for preprocedural laboratory examination: Secondary | ICD-10-CM | POA: Insufficient documentation

## 2021-03-14 LAB — SARS CORONAVIRUS 2 (TAT 6-24 HRS): SARS Coronavirus 2: NEGATIVE

## 2021-03-15 ENCOUNTER — Encounter (HOSPITAL_COMMUNITY): Payer: Self-pay | Admitting: Neurosurgery

## 2021-03-15 NOTE — Progress Notes (Signed)
Spoke with pt for pre-op call. Pt denies cardiac history or HTN. Pt is a Type 1 diabetic. Last A1C was 6.8 on 01/28/21. Pt states her fasting blood sugar is usually in the 80's. Instructed pt to take 80% of her Lantus Insulin Sunday evening and again Monday AM. Instructed her to check her blood sugar when she gets up Monday AM and every 2 hours until she leaves for the hospital. If blood sugar is >220 take 1/2 of usual correction dose of Novolog insulin. If blood sugar is 70 or below, treat with 1/2 cup of clear juice (apple or cranberry) and recheck blood sugar 15 minutes after drinking juice. If blood sugar continues to be 70 or below, call the Short Stay department and ask to speak to a nurse. Pt voiced understanding.  Covid test done 03/14/21 and it's negative. Pt states she's been in quarantine since the test was done and understands that she stays in quarantine until she comes to the hospital on Monday.

## 2021-03-18 ENCOUNTER — Encounter (HOSPITAL_COMMUNITY)
Admission: RE | Disposition: A | Discharge status: Home / Self Care | Payer: Self-pay | Source: Home / Self Care | Attending: Neurosurgery

## 2021-03-18 ENCOUNTER — Encounter (HOSPITAL_COMMUNITY): Payer: Self-pay | Admitting: Neurosurgery

## 2021-03-18 ENCOUNTER — Inpatient Hospital Stay (HOSPITAL_COMMUNITY): Payer: Medicare Other | Admitting: Certified Registered Nurse Anesthetist

## 2021-03-18 ENCOUNTER — Observation Stay (HOSPITAL_COMMUNITY)
Admission: RE | Admit: 2021-03-18 | Discharge: 2021-03-19 | Disposition: A | Discharge status: Home / Self Care | Payer: Medicare Other | Attending: Neurosurgery | Admitting: Neurosurgery

## 2021-03-18 ENCOUNTER — Inpatient Hospital Stay (HOSPITAL_COMMUNITY): Payer: Medicare Other

## 2021-03-18 ENCOUNTER — Other Ambulatory Visit: Payer: Self-pay

## 2021-03-18 DIAGNOSIS — M4316 Spondylolisthesis, lumbar region: Secondary | ICD-10-CM | POA: Diagnosis not present

## 2021-03-18 DIAGNOSIS — E039 Hypothyroidism, unspecified: Secondary | ICD-10-CM | POA: Diagnosis not present

## 2021-03-18 DIAGNOSIS — M48062 Spinal stenosis, lumbar region with neurogenic claudication: Secondary | ICD-10-CM | POA: Insufficient documentation

## 2021-03-18 DIAGNOSIS — M4326 Fusion of spine, lumbar region: Secondary | ICD-10-CM | POA: Diagnosis not present

## 2021-03-18 DIAGNOSIS — Z79899 Other long term (current) drug therapy: Secondary | ICD-10-CM | POA: Diagnosis not present

## 2021-03-18 DIAGNOSIS — M5116 Intervertebral disc disorders with radiculopathy, lumbar region: Secondary | ICD-10-CM | POA: Diagnosis not present

## 2021-03-18 DIAGNOSIS — Z7982 Long term (current) use of aspirin: Secondary | ICD-10-CM | POA: Diagnosis not present

## 2021-03-18 DIAGNOSIS — I10 Essential (primary) hypertension: Secondary | ICD-10-CM | POA: Diagnosis not present

## 2021-03-18 DIAGNOSIS — E109 Type 1 diabetes mellitus without complications: Secondary | ICD-10-CM | POA: Insufficient documentation

## 2021-03-18 DIAGNOSIS — Z794 Long term (current) use of insulin: Secondary | ICD-10-CM | POA: Diagnosis not present

## 2021-03-18 DIAGNOSIS — Z419 Encounter for procedure for purposes other than remedying health state, unspecified: Secondary | ICD-10-CM

## 2021-03-18 DIAGNOSIS — Z981 Arthrodesis status: Secondary | ICD-10-CM | POA: Diagnosis not present

## 2021-03-18 HISTORY — DX: Gastro-esophageal reflux disease without esophagitis: K21.9

## 2021-03-18 HISTORY — DX: Anemia, unspecified: D64.9

## 2021-03-18 HISTORY — DX: Unspecified osteoarthritis, unspecified site: M19.90

## 2021-03-18 LAB — BASIC METABOLIC PANEL
Anion gap: 8 (ref 5–15)
BUN: 10 mg/dL (ref 8–23)
CO2: 28 mmol/L (ref 22–32)
Calcium: 9.9 mg/dL (ref 8.9–10.3)
Chloride: 97 mmol/L — ABNORMAL LOW (ref 98–111)
Creatinine, Ser: 0.65 mg/dL (ref 0.44–1.00)
GFR, Estimated: >60 mL/min (ref >60)
Glucose, Bld: 148 mg/dL — ABNORMAL HIGH (ref 70–99)
Potassium: 3.7 mmol/L (ref 3.5–5.1)
Sodium: 133 mmol/L — ABNORMAL LOW (ref 135–145)

## 2021-03-18 LAB — HEMOGLOBIN A1C
Hgb A1c MFr Bld: 7.9 % — ABNORMAL HIGH (ref 4.8–5.6)
Mean Plasma Glucose: 180.03 mg/dL

## 2021-03-18 LAB — CBC
HCT: 38.6 % (ref 36.0–46.0)
Hemoglobin: 12.9 g/dL (ref 12.0–15.0)
MCH: 28.9 pg (ref 26.0–34.0)
MCHC: 33.4 g/dL (ref 30.0–36.0)
MCV: 86.5 fL (ref 80.0–100.0)
Platelets: 443 10*3/uL — ABNORMAL HIGH (ref 150–400)
RBC: 4.46 MIL/uL (ref 3.87–5.11)
RDW: 13.2 % (ref 11.5–15.5)
WBC: 5.7 10*3/uL (ref 4.0–10.5)
nRBC: 0 % (ref 0.0–0.2)

## 2021-03-18 LAB — GLUCOSE, CAPILLARY
Glucose-Capillary: 158 mg/dL — ABNORMAL HIGH (ref 70–99)
Glucose-Capillary: 106 mg/dL — ABNORMAL HIGH (ref 70–99)
Glucose-Capillary: 181 mg/dL — ABNORMAL HIGH (ref 70–99)
Glucose-Capillary: 261 mg/dL — ABNORMAL HIGH (ref 70–99)
Glucose-Capillary: 215 mg/dL — ABNORMAL HIGH (ref 70–99)

## 2021-03-18 LAB — TYPE AND SCREEN
ABO/RH(D): O NEG
Antibody Screen: NEGATIVE

## 2021-03-18 LAB — POCT I-STAT, CHEM 8
BUN: 10 mg/dL (ref 8–23)
Calcium, Ion: 1.2 mmol/L (ref 1.15–1.40)
Chloride: 97 mmol/L — ABNORMAL LOW (ref 98–111)
Creatinine, Ser: 0.5 mg/dL (ref 0.44–1.00)
Glucose, Bld: 195 mg/dL — ABNORMAL HIGH (ref 70–99)
HCT: 32 % — ABNORMAL LOW (ref 36.0–46.0)
Hemoglobin: 10.9 g/dL — ABNORMAL LOW (ref 12.0–15.0)
Potassium: 3.9 mmol/L (ref 3.5–5.1)
Sodium: 134 mmol/L — ABNORMAL LOW (ref 135–145)
TCO2: 28 mmol/L (ref 22–32)

## 2021-03-18 LAB — ABO/RH: ABO/RH(D): O NEG

## 2021-03-18 LAB — SURGICAL PCR SCREEN
MRSA, PCR: NEGATIVE
Staphylococcus aureus: POSITIVE — AB

## 2021-03-18 SURGERY — POSTERIOR LUMBAR FUSION 1 LEVEL
Anesthesia: General | Site: Spine Lumbar

## 2021-03-18 MED ORDER — OXYCODONE HCL 5 MG PO TABS
5.0000 mg | ORAL_TABLET | ORAL | Status: DC | PRN
  Administered 2021-03-19: 5 mg via ORAL
  Filled 2021-03-18: qty 1

## 2021-03-18 MED ORDER — INSULIN ASPART 100 UNIT/ML ~~LOC~~ SOLN
0.0000 [IU] | Freq: Every day | SUBCUTANEOUS | Status: DC
  Administered 2021-03-18: 3 [IU] via SUBCUTANEOUS

## 2021-03-18 MED ORDER — VITAMIN D 25 MCG (1000 UNIT) PO TABS
10000.0000 [IU] | ORAL_TABLET | Freq: Every day | ORAL | Status: DC
  Administered 2021-03-19: 10000 [IU] via ORAL
  Filled 2021-03-18: qty 10

## 2021-03-18 MED ORDER — BUPIVACAINE LIPOSOME 1.3 % IJ SUSP
INTRAMUSCULAR | Status: DC | PRN
  Administered 2021-03-18: 20 mL

## 2021-03-18 MED ORDER — THROMBIN 5000 UNITS EX SOLR
CUTANEOUS | Status: AC
  Filled 2021-03-18: qty 5000

## 2021-03-18 MED ORDER — FENTANYL CITRATE (PF) 100 MCG/2ML IJ SOLN
25.0000 ug | INTRAMUSCULAR | Status: DC | PRN
Start: 2021-03-18 — End: 2021-03-18

## 2021-03-18 MED ORDER — THROMBIN 5000 UNITS EX SOLR: OROMUCOSAL | Status: DC | PRN

## 2021-03-18 MED ORDER — ONDANSETRON HCL 4 MG/2ML IJ SOLN: 4.0000 mg | Freq: Four times a day (QID) | INTRAMUSCULAR | Status: DC | PRN

## 2021-03-18 MED ORDER — ONDANSETRON HCL 4 MG PO TABS: 4.0000 mg | ORAL_TABLET | Freq: Four times a day (QID) | ORAL | Status: DC | PRN

## 2021-03-18 MED ORDER — BACITRACIN ZINC 500 UNIT/GM EX OINT
TOPICAL_OINTMENT | CUTANEOUS | Status: DC | PRN
  Administered 2021-03-18: 1 via TOPICAL

## 2021-03-18 MED ORDER — POTASSIUM CHLORIDE CRYS ER 20 MEQ PO TBCR
20.0000 meq | EXTENDED_RELEASE_TABLET | Freq: Every day | ORAL | Status: DC
  Administered 2021-03-19: 20 meq via ORAL
  Filled 2021-03-18: qty 1

## 2021-03-18 MED ORDER — 0.9 % SODIUM CHLORIDE (POUR BTL) OPTIME
TOPICAL | Status: DC | PRN
  Administered 2021-03-18: 1000 mL

## 2021-03-18 MED ORDER — PHENYLEPHRINE HCL-NACL 10-0.9 MG/250ML-% IV SOLN
INTRAVENOUS | Status: DC | PRN
  Administered 2021-03-18: 30 ug/min via INTRAVENOUS

## 2021-03-18 MED ORDER — PROPOFOL 500 MG/50ML IV EMUL
INTRAVENOUS | Status: DC | PRN
  Administered 2021-03-18: 25 ug/kg/min via INTRAVENOUS

## 2021-03-18 MED ORDER — CHLORHEXIDINE GLUCONATE 0.12 % MT SOLN
15.0000 mL | Freq: Once | OROMUCOSAL | Status: AC
  Administered 2021-03-18: 15 mL via OROMUCOSAL
  Filled 2021-03-18: qty 15

## 2021-03-18 MED ORDER — ALBUMIN HUMAN 5 % IV SOLN: INTRAVENOUS | Status: DC | PRN

## 2021-03-18 MED ORDER — PANTOPRAZOLE SODIUM 40 MG PO TBEC
80.0000 mg | DELAYED_RELEASE_TABLET | Freq: Every day | ORAL | Status: DC
  Administered 2021-03-19: 80 mg via ORAL
  Filled 2021-03-18: qty 2

## 2021-03-18 MED ORDER — COQ10 100 MG PO CAPS: 100.0000 mg | ORAL_CAPSULE | Freq: Every day | ORAL | Status: DC

## 2021-03-18 MED ORDER — CHLORHEXIDINE GLUCONATE CLOTH 2 % EX PADS: 6.0000 | MEDICATED_PAD | Freq: Once | CUTANEOUS | Status: DC

## 2021-03-18 MED ORDER — POLYETHYLENE GLYCOL 3350 17 G PO PACK: 17.0000 g | PACK | Freq: Every day | ORAL | Status: DC | PRN

## 2021-03-18 MED ORDER — PROPOFOL 10 MG/ML IV BOLUS
INTRAVENOUS | Status: AC
  Filled 2021-03-18: qty 20

## 2021-03-18 MED ORDER — ROCURONIUM BROMIDE 10 MG/ML (PF) SYRINGE
PREFILLED_SYRINGE | INTRAVENOUS | Status: AC
  Filled 2021-03-18: qty 10

## 2021-03-18 MED ORDER — ONDANSETRON HCL 4 MG/2ML IJ SOLN
INTRAMUSCULAR | Status: DC | PRN
  Administered 2021-03-18: 4 mg via INTRAVENOUS

## 2021-03-18 MED ORDER — PREGABALIN 75 MG PO CAPS
225.0000 mg | ORAL_CAPSULE | Freq: Every day | ORAL | Status: DC
Start: 2021-03-18 — End: 2021-03-19
  Administered 2021-03-18: 225 mg via ORAL
  Filled 2021-03-18 (×2): qty 3

## 2021-03-18 MED ORDER — AMISULPRIDE (ANTIEMETIC) 5 MG/2ML IV SOLN: 10.0000 mg | Freq: Once | INTRAVENOUS | Status: DC | PRN

## 2021-03-18 MED ORDER — INSULIN ASPART 100 UNIT/ML ~~LOC~~ SOLN
SUBCUTANEOUS | Status: DC | PRN
  Administered 2021-03-18: 2 [IU] via SUBCUTANEOUS

## 2021-03-18 MED ORDER — LEVOTHYROXINE SODIUM 100 MCG PO TABS
100.0000 ug | ORAL_TABLET | Freq: Every day | ORAL | Status: DC
  Administered 2021-03-19: 100 ug via ORAL
  Filled 2021-03-18: qty 1

## 2021-03-18 MED ORDER — INSULIN GLARGINE 100 UNIT/ML ~~LOC~~ SOLN
10.0000 [IU] | Freq: Two times a day (BID) | SUBCUTANEOUS | Status: DC
  Administered 2021-03-18 – 2021-03-19 (×2): 10 [IU] via SUBCUTANEOUS
  Filled 2021-03-18 (×3): qty 0.1

## 2021-03-18 MED ORDER — DEXAMETHASONE SODIUM PHOSPHATE 10 MG/ML IJ SOLN
INTRAMUSCULAR | Status: DC | PRN
  Administered 2021-03-18: 5 mg via INTRAVENOUS

## 2021-03-18 MED ORDER — FENTANYL CITRATE (PF) 250 MCG/5ML IJ SOLN
INTRAMUSCULAR | Status: DC | PRN
  Administered 2021-03-18: 50 ug via INTRAVENOUS
  Administered 2021-03-18: 100 ug via INTRAVENOUS

## 2021-03-18 MED ORDER — BUPIVACAINE-EPINEPHRINE 0.5% -1:200000 IJ SOLN
INTRAMUSCULAR | Status: AC
  Filled 2021-03-18: qty 1

## 2021-03-18 MED ORDER — MIDAZOLAM HCL 2 MG/2ML IJ SOLN
INTRAMUSCULAR | Status: AC
  Filled 2021-03-18: qty 2

## 2021-03-18 MED ORDER — SODIUM CHLORIDE 0.9% FLUSH: 3.0000 mL | INTRAVENOUS | Status: DC | PRN

## 2021-03-18 MED ORDER — FENTANYL CITRATE (PF) 250 MCG/5ML IJ SOLN
INTRAMUSCULAR | Status: AC
  Filled 2021-03-18: qty 5

## 2021-03-18 MED ORDER — PHENOL 1.4 % MT LIQD: 1.0000 | OROMUCOSAL | Status: DC | PRN

## 2021-03-18 MED ORDER — ACETAMINOPHEN 500 MG PO TABS: 1000.0000 mg | ORAL_TABLET | Freq: Once | ORAL | Status: AC

## 2021-03-18 MED ORDER — OXYCODONE HCL 5 MG PO TABS
10.0000 mg | ORAL_TABLET | ORAL | Status: DC | PRN
  Administered 2021-03-18 (×2): 10 mg via ORAL
  Filled 2021-03-18 (×2): qty 2

## 2021-03-18 MED ORDER — ACETAMINOPHEN 325 MG PO TABS
650.0000 mg | ORAL_TABLET | ORAL | Status: DC | PRN
Start: 2021-03-18 — End: 2021-03-19

## 2021-03-18 MED ORDER — CEFAZOLIN SODIUM-DEXTROSE 2-4 GM/100ML-% IV SOLN
2.0000 g | INTRAVENOUS | Status: AC
  Administered 2021-03-18: 2 g via INTRAVENOUS
  Filled 2021-03-18: qty 100

## 2021-03-18 MED ORDER — MORPHINE SULFATE (PF) 4 MG/ML IV SOLN: 4.0000 mg | INTRAVENOUS | Status: DC | PRN

## 2021-03-18 MED ORDER — LACTATED RINGERS IV SOLN: INTRAVENOUS | Status: DC

## 2021-03-18 MED ORDER — LIDOCAINE 2% (20 MG/ML) 5 ML SYRINGE
INTRAMUSCULAR | Status: DC | PRN
  Administered 2021-03-18: 60 mg via INTRAVENOUS

## 2021-03-18 MED ORDER — ONDANSETRON HCL 4 MG/2ML IJ SOLN
INTRAMUSCULAR | Status: AC
  Filled 2021-03-18: qty 2

## 2021-03-18 MED ORDER — ROCURONIUM BROMIDE 10 MG/ML (PF) SYRINGE
PREFILLED_SYRINGE | INTRAVENOUS | Status: DC | PRN
  Administered 2021-03-18: 60 mg via INTRAVENOUS
  Administered 2021-03-18: 20 mg via INTRAVENOUS
  Administered 2021-03-18 (×3): 10 mg via INTRAVENOUS

## 2021-03-18 MED ORDER — SODIUM CHLORIDE 0.9 % IV SOLN: 250.0000 mL | INTRAVENOUS | Status: DC

## 2021-03-18 MED ORDER — ACETAMINOPHEN 650 MG RE SUPP: 650.0000 mg | RECTAL | Status: DC | PRN

## 2021-03-18 MED ORDER — MIDAZOLAM HCL 2 MG/2ML IJ SOLN
INTRAMUSCULAR | Status: DC | PRN
  Administered 2021-03-18: 2 mg via INTRAVENOUS

## 2021-03-18 MED ORDER — LIDOCAINE 2% (20 MG/ML) 5 ML SYRINGE
INTRAMUSCULAR | Status: AC
  Filled 2021-03-18: qty 5

## 2021-03-18 MED ORDER — PROPOFOL 10 MG/ML IV BOLUS
INTRAVENOUS | Status: DC | PRN
  Administered 2021-03-18: 130 mg via INTRAVENOUS

## 2021-03-18 MED ORDER — HIGH POTENCY IRON 65 MG PO TABS: 1.0000 | ORAL_TABLET | Freq: Every day | ORAL | Status: DC

## 2021-03-18 MED ORDER — SUGAMMADEX SODIUM 200 MG/2ML IV SOLN
INTRAVENOUS | Status: DC | PRN
  Administered 2021-03-18: 200 mg via INTRAVENOUS

## 2021-03-18 MED ORDER — CYCLOBENZAPRINE HCL 10 MG PO TABS
10.0000 mg | ORAL_TABLET | Freq: Three times a day (TID) | ORAL | Status: DC | PRN
  Administered 2021-03-18: 10 mg via ORAL
  Filled 2021-03-18: qty 1

## 2021-03-18 MED ORDER — INSULIN ASPART 100 UNIT/ML ~~LOC~~ SOLN
0.0000 [IU] | Freq: Three times a day (TID) | SUBCUTANEOUS | Status: DC
  Administered 2021-03-18: 4 [IU] via SUBCUTANEOUS
  Administered 2021-03-19: 20 [IU] via SUBCUTANEOUS

## 2021-03-18 MED ORDER — BUPIVACAINE-EPINEPHRINE (PF) 0.5% -1:200000 IJ SOLN
INTRAMUSCULAR | Status: DC | PRN
  Administered 2021-03-18: 10 mL

## 2021-03-18 MED ORDER — CEFAZOLIN SODIUM-DEXTROSE 2-4 GM/100ML-% IV SOLN
2.0000 g | Freq: Three times a day (TID) | INTRAVENOUS | Status: AC
  Administered 2021-03-18 – 2021-03-19 (×2): 2 g via INTRAVENOUS
  Filled 2021-03-18 (×2): qty 100

## 2021-03-18 MED ORDER — SODIUM CHLORIDE 0.9 % IV SOLN: INTRAVENOUS | Status: DC | PRN

## 2021-03-18 MED ORDER — MENTHOL 3 MG MT LOZG: 1.0000 | LOZENGE | OROMUCOSAL | Status: DC | PRN

## 2021-03-18 MED ORDER — BACITRACIN ZINC 500 UNIT/GM EX OINT
TOPICAL_OINTMENT | CUTANEOUS | Status: AC
  Filled 2021-03-18: qty 28.35

## 2021-03-18 MED ORDER — BISACODYL 10 MG RE SUPP: 10.0000 mg | Freq: Every day | RECTAL | Status: DC | PRN

## 2021-03-18 MED ORDER — ORAL CARE MOUTH RINSE: 15.0000 mL | Freq: Once | OROMUCOSAL | Status: AC

## 2021-03-18 MED ORDER — FERROUS SULFATE 325 (65 FE) MG PO TABS
325.0000 mg | ORAL_TABLET | Freq: Every day | ORAL | Status: DC
  Administered 2021-03-19: 325 mg via ORAL
  Filled 2021-03-18: qty 1

## 2021-03-18 MED ORDER — FUROSEMIDE 20 MG PO TABS
20.0000 mg | ORAL_TABLET | Freq: Every day | ORAL | Status: DC
  Administered 2021-03-19: 20 mg via ORAL
  Filled 2021-03-18: qty 1

## 2021-03-18 MED ORDER — SODIUM CHLORIDE 0.9% FLUSH
3.0000 mL | Freq: Two times a day (BID) | INTRAVENOUS | Status: DC
  Administered 2021-03-18 – 2021-03-19 (×2): 3 mL via INTRAVENOUS

## 2021-03-18 MED ORDER — BUPIVACAINE LIPOSOME 1.3 % IJ SUSP
INTRAMUSCULAR | Status: AC
  Filled 2021-03-18: qty 20

## 2021-03-18 MED ORDER — ACETAMINOPHEN 500 MG PO TABS
ORAL_TABLET | ORAL | Status: AC
  Administered 2021-03-18: 1000 mg via ORAL
  Filled 2021-03-18: qty 2

## 2021-03-18 MED ORDER — DOCUSATE SODIUM 100 MG PO CAPS
100.0000 mg | ORAL_CAPSULE | Freq: Two times a day (BID) | ORAL | Status: DC
  Administered 2021-03-18 – 2021-03-19 (×2): 100 mg via ORAL
  Filled 2021-03-18 (×2): qty 1

## 2021-03-18 MED ORDER — ACETAMINOPHEN 500 MG PO TABS
1000.0000 mg | ORAL_TABLET | Freq: Four times a day (QID) | ORAL | Status: AC
  Administered 2021-03-18 – 2021-03-19 (×4): 1000 mg via ORAL
  Filled 2021-03-18 (×4): qty 2

## 2021-03-18 MED ORDER — ZOLPIDEM TARTRATE 5 MG PO TABS: 5.0000 mg | ORAL_TABLET | Freq: Every evening | ORAL | Status: DC | PRN

## 2021-03-18 SURGICAL SUPPLY — 61 items
APL SKNCLS STERI-STRIP NONHPOA (GAUZE/BANDAGES/DRESSINGS) ×1
BENZOIN TINCTURE PRP APPL 2/3 (GAUZE/BANDAGES/DRESSINGS) ×2 IMPLANT
BLADE CLIPPER SURG (BLADE) IMPLANT
BUR MATCHSTICK NEURO 3.0 LAGG (BURR) ×2 IMPLANT
BUR PRECISION FLUTE 6.0 (BURR) ×2 IMPLANT
CAGE ALTERA 10X31X9-13 15D (Cage) ×1 IMPLANT
CANISTER SUCT 3000ML PPV (MISCELLANEOUS) ×2 IMPLANT
CAP LOCK DLX THRD (Cap) ×4 IMPLANT
CARTRIDGE OIL MAESTRO DRILL (MISCELLANEOUS) ×1 IMPLANT
CNTNR URN SCR LID CUP LEK RST (MISCELLANEOUS) ×1 IMPLANT
CONT SPEC 4OZ STRL OR WHT (MISCELLANEOUS) ×2
COVER BACK TABLE 60X90IN (DRAPES) ×2 IMPLANT
COVER WAND RF STERILE (DRAPES) ×2 IMPLANT
DECANTER SPIKE VIAL GLASS SM (MISCELLANEOUS) ×2 IMPLANT
DIFFUSER DRILL AIR PNEUMATIC (MISCELLANEOUS) ×2 IMPLANT
DRAPE C-ARM 42X72 X-RAY (DRAPES) ×4 IMPLANT
DRAPE HALF SHEET 40X57 (DRAPES) ×2 IMPLANT
DRAPE LAPAROTOMY 100X72X124 (DRAPES) ×2 IMPLANT
DRAPE SURG 17X23 STRL (DRAPES) ×8 IMPLANT
DRSG OPSITE POSTOP 4X6 (GAUZE/BANDAGES/DRESSINGS) ×2 IMPLANT
ELECT BLADE 4.0 EZ CLEAN MEGAD (MISCELLANEOUS) ×2
ELECT REM PT RETURN 9FT ADLT (ELECTROSURGICAL) ×2
ELECTRODE BLDE 4.0 EZ CLN MEGD (MISCELLANEOUS) ×1 IMPLANT
ELECTRODE REM PT RTRN 9FT ADLT (ELECTROSURGICAL) ×1 IMPLANT
EVACUATOR 1/8 PVC DRAIN (DRAIN) IMPLANT
GAUZE 4X4 16PLY RFD (DISPOSABLE) ×2 IMPLANT
GLOVE BIO SURGEON STRL SZ8 (GLOVE) ×4 IMPLANT
GLOVE BIO SURGEON STRL SZ8.5 (GLOVE) ×4 IMPLANT
GLOVE EXAM NITRILE XL STR (GLOVE) IMPLANT
GOWN STRL REUS W/ TWL LRG LVL3 (GOWN DISPOSABLE) IMPLANT
GOWN STRL REUS W/ TWL XL LVL3 (GOWN DISPOSABLE) ×2 IMPLANT
GOWN STRL REUS W/TWL 2XL LVL3 (GOWN DISPOSABLE) IMPLANT
GOWN STRL REUS W/TWL LRG LVL3 (GOWN DISPOSABLE)
GOWN STRL REUS W/TWL XL LVL3 (GOWN DISPOSABLE) ×4
HEMOSTAT POWDER KIT SURGIFOAM (HEMOSTASIS) ×3 IMPLANT
KIT BASIN OR (CUSTOM PROCEDURE TRAY) ×2 IMPLANT
KIT TURNOVER KIT B (KITS) ×2 IMPLANT
MILL MEDIUM DISP (BLADE) ×2 IMPLANT
NDL HYPO 21X1.5 SAFETY (NEEDLE) IMPLANT
NEEDLE HYPO 21X1.5 SAFETY (NEEDLE) IMPLANT
NEEDLE HYPO 22GX1.5 SAFETY (NEEDLE) ×2 IMPLANT
NS IRRIG 1000ML POUR BTL (IV SOLUTION) ×2 IMPLANT
OIL CARTRIDGE MAESTRO DRILL (MISCELLANEOUS) ×2
PACK LAMINECTOMY NEURO (CUSTOM PROCEDURE TRAY) ×2 IMPLANT
PAD ARMBOARD 7.5X6 YLW CONV (MISCELLANEOUS) ×6 IMPLANT
PATTIES SURGICAL .5 X1 (DISPOSABLE) IMPLANT
PUTTY DBM 5CC CALC GRAN ×2 IMPLANT
ROD CURVED TI 6.35X50 (Rod) ×2 IMPLANT
SCREW PA CREO DLX 6.5X50 (Screw) ×4 IMPLANT
SPONGE LAP 4X18 RFD (DISPOSABLE) IMPLANT
SPONGE NEURO XRAY DETECT 1X3 (DISPOSABLE) IMPLANT
SPONGE SURGIFOAM ABS GEL 100 (HEMOSTASIS) IMPLANT
STRIP CLOSURE SKIN 1/2X4 (GAUZE/BANDAGES/DRESSINGS) ×2 IMPLANT
SUT VIC AB 1 CT1 18XBRD ANBCTR (SUTURE) ×2 IMPLANT
SUT VIC AB 1 CT1 8-18 (SUTURE) ×4
SUT VIC AB 2-0 CP2 18 (SUTURE) ×4 IMPLANT
SYR 20ML LL LF (SYRINGE) IMPLANT
TOWEL GREEN STERILE (TOWEL DISPOSABLE) ×2 IMPLANT
TOWEL GREEN STERILE FF (TOWEL DISPOSABLE) ×2 IMPLANT
TRAY FOLEY MTR SLVR 16FR STAT (SET/KITS/TRAYS/PACK) ×2 IMPLANT
WATER STERILE IRR 1000ML POUR (IV SOLUTION) ×2 IMPLANT

## 2021-03-18 NOTE — H&P (Signed)
Subjective: The patient is a 70 year old white female who has complained of back and bilateral leg pain consistent with neurogenic claudication.  She has failed medical management and was worked up with lumbar x-rays and lumbar MRI which demonstrated a spondylolisthesis and spinal stenosis at L4-5.  I discussed the various treatment options with her.  She has decided to proceed with surgery.  Past Medical History:  Diagnosis Date  . Anemia   . Anxiety   . Arthritis   . Back pain   . Diabetes mellitus without complication ([place])    type 1  . GERD (gastroesophageal reflux disease)   . Hypothyroidism   . Thyroid disease   . Varicose vein     Past Surgical History:  Procedure Laterality Date  . ABDOMINAL HYSTERECTOMY  1994  . CHOLECYSTECTOMY  09-07-13  . COLONOSCOPY  2009   normal  . COLONOSCOPY WITH PROPOFOL N/A 10/20/2018   Procedure: COLONOSCOPY WITH PROPOFOL;  Surgeon: Robert Bellow, MD;  Location: [facility] ENDOSCOPY;  Service: Endoscopy;  Laterality: N/A;  . INCONTINENCE SURGERY  2003  . left leg vein ligation  2015  . VARICOSE VEIN SURGERY  1990's   Dr Bary Castilla  . VEIN SURGERY Left 11/17/2017   vein closure procedure/Dr Jamal Collin    No Known Allergies  Social History   Tobacco Use  . Smoking status: Never Smoker  . Smokeless tobacco: Never Used  Substance Use Topics  . Alcohol use: Yes    Alcohol/week: 7.0 standard drinks    Types: 7 Shots of liquor per week    Comment: vodka 1 drink nightly  and/ or wine    Family History  Problem Relation Age of Onset  . Diabetes Mother   . Pancreatitis Mother   . Heart disease Father   . Stroke Father        x 2  . Hypertension Father   . Fibromyalgia Sister   . Scoliosis Sister   . Arthritis Sister   . Diabetes Sister   . Breast cancer Neg Hx    Prior to Admission medications   Medication Sig Start Date End Date Taking? Authorizing Provider  acetaminophen (TYLENOL) 500 MG tablet Take 1,000 mg by mouth daily as needed.   Yes  [provider]  ALPRAZolam (XANAX) 0.5 MG tablet TAKE 1 TABLET(0.5 MG) BY MOUTH TWICE DAILY Patient taking differently: Take 0.5 mg by mouth at bedtime. 02/11/21  Yes Mar Daring, PA-C  APPLE CIDER VINEGAR PO Take 600 mg by mouth daily.    Yes [provider]  aspirin EC 81 MG tablet Take 81 mg by mouth daily. Takes at bedtime   Yes [provider]  beta carotene 25000 UNIT capsule Take 25,000 Units by mouth daily.   Yes [provider]  Cholecalciferol (VITAMIN D3) 250 MCG (10000 UT) capsule Take 10,000 Units by mouth daily.   Yes [provider]  Coenzyme Q10 (COQ10) 100 MG CAPS Take 100 mg by mouth daily. With cinnamon   Yes [provider]  Ferrous Sulfate Dried (HIGH POTENCY IRON) 65 MG TABS Take 1 tablet by mouth daily.   Yes [provider]  furosemide (LASIX) 20 MG tablet TAKE 1 TABLET(20 MG) BY MOUTH DAILY Patient taking differently: Take 20 mg by mouth daily. TAKE 1 TABLET(20 MG) BY MOUTH DAILY 02/11/21  Yes Mar Daring, PA-C  ibuprofen (ADVIL) 200 MG tablet Take 400 mg by mouth daily as needed for moderate pain.   Yes [provider]  insulin aspart (NOVOLOG FLEXPEN) 100 UNIT/ML FlexPen Inject 5-10 Units into the skin 3 (three) times daily with meals. Per sliding scale   Yes [provider]  insulin glargine (LANTUS SOLOSTAR) 100 UNIT/ML Solostar Pen Inject 10 Units into the skin 2 (two) times daily. 03/13/21  Yes Mar Daring, PA-C  levothyroxine (SYNTHROID) 100 MCG tablet TAKE 1 TABLET BY MOUTH DAILY BEFORE BREAKFAST Patient taking differently: Take 100 mcg by mouth daily before breakfast. TAKE 1 TABLET BY MOUTH DAILY BEFORE BREAKFAST 01/18/21  Yes [name] M, PA-C  Lysine 500 MG TABS Take 1,000 mg by mouth daily.    Yes [provider]  MAGNESIUM-ZINC PO Take 1 tablet by mouth daily.   Yes [provider]  Multiple Vitamins-Minerals (ABC PLUS SENIOR  ADULTS 50+ PO) Take 1 tablet by mouth daily. 11/19/20  Yes [provider]  naproxen sodium (ALEVE) 220 MG tablet Take 440 mg by mouth daily as needed (pain).   Yes [provider]  omeprazole (PRILOSEC) 40 MG capsule Take 1 capsule (40 mg total) by mouth daily. 11/06/20  Yes [name] M, PA-C  polyethylene glycol (MIRALAX / GLYCOLAX) 17 g packet Take 17 g by mouth daily as needed.   Yes [provider]  potassium chloride SA (KLOR-CON) 20 MEQ tablet TAKE 1 TABLET BY MOUTH EVERY DAY. TAKE WITH FUROSEMIDE WHEN NEEDED AS DIRECTED Patient taking differently: Take 20 mEq by mouth daily. TAKE 1 TABLET BY MOUTH EVERY DAY. TAKE WITH FUROSEMIDE WHEN NEEDED AS DIRECTED 01/18/21  Yes [place], Clearnce Sorrel, PA-C  pregabalin (LYRICA) 225 MG capsule TAKE 1 CAPSULE(225 MG) BY MOUTH DAILY Patient taking differently: Take 225 mg by mouth daily. 02/11/21  Yes Mar Daring, PA-C  Turmeric 500 MG CAPS Take 500 mg by mouth daily. 01/11/21  Yes [provider]  Continuous Blood Gluc Sensor (FREESTYLE LIBRE 14 DAY SENSOR) MISC To check blood sugar ACHS, remove and replace every 14 days 02/18/21   Mar Daring, PA-C  CONTOUR NEXT TEST test strip CHECK BLOOD SUGAR BEFORE MEALS AND AT BEDTIME 02/09/19   Mar Daring, PA-C  Insulin Pen Needle (B-D ULTRAFINE III SHORT PEN) 31G X 8 MM MISC USE AS DIRECTED FOUR TIMES DAILY, BEFORE MEALS AND AT BEDTIME 04/08/18   Mar Daring, PA-C     Review of Systems  Positive ROS: As above  All other systems have been reviewed and were otherwise negative with the exception of those mentioned in the HPI and as above.  Objective: Vital signs in last 24 hours: Temp:  [98.4 F (36.9 C)] 98.4 F (36.9 C) (03/28 0823) Pulse Rate:  [74] 74 (03/28 0823) Resp:  [20] 20 (03/28 0823) BP: (175-193)/(77-79) 175/77 (03/28 0850) SpO2:  [99 %] 99 % (03/28 0823) Weight:  [88 kg] 88 kg (03/28 0823) Estimated body mass index is  32.28 kg/m as calculated from the following:   Height as of this encounter: 5\' 5"  (1.651 m).   Weight as of this encounter: 88 kg.   General Appearance: Alert Head: Normocephalic, without obvious abnormality, atraumatic Eyes: PERRL, conjunctiva/corneas clear, EOM's intact,    Ears: Normal  Throat: Normal  Neck: Supple, Back: unremarkable Lungs: Clear to auscultation bilaterally, respirations unlabored Heart: Regular rate and rhythm, no murmur, rub or gallop Abdomen: Soft, non-tender Extremities: Extremities normal, atraumatic, no cyanosis or edema Skin: unremarkable  NEUROLOGIC:   Mental status: alert and oriented,Motor Exam - grossly normal Sensory Exam - grossly normal Reflexes:  Coordination -  grossly normal Gait - grossly normal Balance - grossly normal Cranial Nerves: I: smell Not tested  II: visual acuity  OS: Normal  OD: Normal   II: visual fields Full to confrontation  II: pupils Equal, round, reactive to light  III,VII: ptosis None  III,IV,VI: extraocular muscles  Full ROM  V: mastication Normal  V: facial light touch sensation  Normal  V,VII: corneal reflex  Present  VII: facial muscle function - upper  Normal  VII: facial muscle function - lower Normal  VIII: hearing Not tested  IX: soft palate elevation  Normal  IX,X: gag reflex Present  XI: trapezius strength  5/5  XI: sternocleidomastoid strength 5/5  XI: neck flexion strength  5/5  XII: tongue strength  Normal    Data Review Lab Results  Component Value Date   WBC 5.7 03/18/2021   HGB 12.9 03/18/2021   HCT 38.6 03/18/2021   MCV 86.5 03/18/2021   PLT 443 (H) 03/18/2021   Lab Results  Component Value Date   NA 133 (L) 03/18/2021   K 3.7 03/18/2021   CL 97 (L) 03/18/2021   CO2 28 03/18/2021   BUN 10 03/18/2021   CREATININE 0.65 03/18/2021   GLUCOSE 148 (H) 03/18/2021   No results found for: INR, PROTIME  Assessment/Plan: L4-5 spondylolisthesis, spinal stenosis, lumbago, lumbar  radiculopathy, neurogenic claudication: I have discussed the situation with the patient.  I have reviewed her imaging studies with her and pointed out the abnormalities.  We have discussed the various treatment options including surgery.  I have described the surgical treatment option of an L4-5 decompression, instrumentation and fusion.  I have shown her surgical models.  I have given her a surgical pamphlet.  We have discussed the risk, benefits, alternatives, expected postoperative course, and likelihood of achieving our goals with surgery.  I have answered all her questions.  She has decided proceed with surgery.   Ophelia Charter 03/18/2021 10:08 AM

## 2021-03-18 NOTE — Anesthesia Preprocedure Evaluation (Addendum)
Anesthesia Evaluation  Patient identified by MRN, date of birth, ID band Patient awake    Reviewed: Allergy & Precautions, NPO status , Patient's Chart, lab work & pertinent test results  Airway Mallampati: II  TM Distance: >3 FB Neck ROM: Full    Dental  (+) Dental Advisory Given   Pulmonary neg pulmonary ROS,    breath sounds clear to auscultation       Cardiovascular hypertension,  Rhythm:Regular Rate:Normal     Neuro/Psych  Headaches,  Neuromuscular disease    GI/Hepatic Neg liver ROS, GERD  ,  Endo/Other  diabetes, Type 1, Insulin DependentHypothyroidism   Renal/GU Renal disease     Musculoskeletal  (+) Arthritis ,   Abdominal   Peds  Hematology negative hematology ROS (+)   Anesthesia Other Findings   Reproductive/Obstetrics                            Anesthesia Physical Anesthesia Plan  ASA: II  Anesthesia Plan: General   Post-op Pain Management:    Induction: Intravenous  PONV Risk Score and Plan: 3 and Ondansetron, Dexamethasone, Propofol infusion and Treatment may vary due to age or medical condition  Airway Management Planned: Oral ETT  Additional Equipment: None  Intra-op Plan:   Post-operative Plan: Extubation in OR  Informed Consent: I have reviewed the patients History and Physical, chart, labs and discussed the procedure including the risks, benefits and alternatives for the proposed anesthesia with the patient or authorized representative who has indicated his/her understanding and acceptance.     Dental advisory given  Plan Discussed with: CRNA  Anesthesia Plan Comments:         Anesthesia Quick Evaluation

## 2021-03-18 NOTE — Anesthesia Procedure Notes (Signed)
Procedure Name: Intubation Date/Time: 03/18/2021 10:34 AM Performed by: Reece Agar, CRNA Pre-anesthesia Checklist: Patient identified, Emergency Drugs available, Suction available and Patient being monitored Patient Re-evaluated:Patient Re-evaluated prior to induction Oxygen Delivery Method: Circle System Utilized Preoxygenation: Pre-oxygenation with 100% oxygen Induction Type: IV induction Ventilation: Mask ventilation without difficulty Laryngoscope Size: Mac and 3 Grade View: Grade II Tube type: Oral Tube size: 7.0 mm Number of attempts: 1 Airway Equipment and Method: Stylet Placement Confirmation: ETT inserted through vocal cords under direct vision,  positive ETCO2 and breath sounds checked- equal and bilateral Secured at: 22 cm Tube secured with: Tape Dental Injury: Teeth and Oropharynx as per pre-operative assessment

## 2021-03-18 NOTE — Anesthesia Postprocedure Evaluation (Signed)
Anesthesia Post Note  Patient: Karla Patton  Procedure(s) Performed: LUMBAR FOUR-FIVE POSTERIOR LUMBAR INTERBODY FUSION, INTERBODY PROSTHESIS, POSTERIOR INSTRUMENTATION (N/A Spine Lumbar)     Patient location during evaluation: PACU Anesthesia Type: General Level of consciousness: awake and alert Pain management: pain level controlled Vital Signs Assessment: post-procedure vital signs reviewed and stable Respiratory status: spontaneous breathing, nonlabored ventilation, respiratory function stable and patient connected to nasal cannula oxygen Cardiovascular status: blood pressure returned to baseline and stable Postop Assessment: no apparent nausea or vomiting Anesthetic complications: no   No complications documented.  Last Vitals:  Vitals:   03/18/21 1446 03/18/21 1529  BP: 134/85 (!) 160/78  Pulse: 71 71  Resp: 13 20  Temp: 36.5 C 36.8 C  SpO2: 95% 98%    Last Pain:  Vitals:   03/18/21 1529  TempSrc: Oral  PainSc:                  Karla Patton

## 2021-03-18 NOTE — Progress Notes (Signed)
Subjective: The patient is alert and pleasant.  She is in no apparent distress.  She looks well.  Objective: Vital signs in last 24 hours: Temp:  [97.7 F (36.5 C)-98.4 F (36.9 C)] 97.7 F (36.5 C) (03/28 1417) Pulse Rate:  [74-76] 76 (03/28 1417) Resp:  [18-20] 18 (03/28 1417) BP: (152-193)/(77-83) 152/83 (03/28 1417) SpO2:  [99 %-100 %] 100 % (03/28 1417) Weight:  [88 kg] 88 kg (03/28 0823) Estimated body mass index is 32.28 kg/m as calculated from the following:   Height as of this encounter: 5\' 5"  (1.651 m).   Weight as of this encounter: 88 kg.   Intake/Output from previous day: No intake/output data recorded. Intake/Output this shift: Total I/O In: 1700 [I.V.:1100; IV Piggyback:600] Out: 630 [Urine:430; Blood:200]  Physical exam the patient is alert and pleasant.  She is moving her lower extremities well.  Lab Results: Recent Labs    03/18/21 0937  WBC 5.7  HGB 12.9  HCT 38.6  PLT 443*   BMET Recent Labs    03/18/21 0937  NA 133*  K 3.7  CL 97*  CO2 28  GLUCOSE 148*  BUN 10  CREATININE 0.65  CALCIUM 9.9    Studies/Results: DG Lumbar Spine 2-3 Views  Result Date: 03/18/2021 CLINICAL DATA:  Elective surgery. Additional history provided: Lumbar 4-5 posterior lumbar interbody fusion, interbody prosthesis, posterior instrumentation. Provided fluoroscopy time 20 seconds (17.80 mGy). EXAM: LUMBAR SPINE - 2-3 VIEW; DG C-ARM 1-60 MIN COMPARISON:  Lateral view intraoperative radiographs of the lumbar spine 03/18/2021. Lumbar spine MRI 09/17/2020. FINDINGS: AP and lateral view intraoperative fluoroscopic images of the lumbar spine are submitted, 4 images total. The images demonstrate bilateral pedicle screws at L4 and L5. Vertical interconnecting rods were not present at the time the images were taken. An L4-L5 interbody device is also present. Overlying retractors. IMPRESSION: Four intraoperative fluoroscopic images of the lumbar spine from L4-L5 fusion, as  described. Electronically Signed   By: Kellie Simmering DO   On: 03/18/2021 14:02   DG C-Arm 1-60 Min  Result Date: 03/18/2021 CLINICAL DATA:  Elective surgery. Additional history provided: Lumbar 4-5 posterior lumbar interbody fusion, interbody prosthesis, posterior instrumentation. Provided fluoroscopy time 20 seconds (17.80 mGy). EXAM: LUMBAR SPINE - 2-3 VIEW; DG C-ARM 1-60 MIN COMPARISON:  Lateral view intraoperative radiographs of the lumbar spine 03/18/2021. Lumbar spine MRI 09/17/2020. FINDINGS: AP and lateral view intraoperative fluoroscopic images of the lumbar spine are submitted, 4 images total. The images demonstrate bilateral pedicle screws at L4 and L5. Vertical interconnecting rods were not present at the time the images were taken. An L4-L5 interbody device is also present. Overlying retractors. IMPRESSION: Four intraoperative fluoroscopic images of the lumbar spine from L4-L5 fusion, as described. Electronically Signed   By: Kellie Simmering DO   On: 03/18/2021 14:02    Assessment/Plan: The patient is doing well.  I spoke with her husband.  LOS: 0 days     Karla Patton 03/18/2021, 2:27 PM

## 2021-03-18 NOTE — Progress Notes (Signed)
Orthopedic Tech Progress Note Patient Details:  Karla Patton 30-Mar-1951 [id number] Called in order to [facility] for an [facility] Patient ID: [name], female   DOB: 1951/08/28, 70 y.o.   MRN: [id number]   Janit Pagan 03/18/2021, 2:50 PM

## 2021-03-18 NOTE — Transfer of Care (Signed)
Immediate Anesthesia Transfer of Care Note  Patient: Karla Patton  Procedure(s) Performed: LUMBAR FOUR-FIVE POSTERIOR LUMBAR INTERBODY FUSION, INTERBODY PROSTHESIS, POSTERIOR INSTRUMENTATION (N/A Spine Lumbar)  Patient Location: PACU  Anesthesia Type:General  Level of Consciousness: awake and alert   Airway & Oxygen Therapy: Patient Spontanous Breathing and Patient connected to face mask oxygen  Post-op Assessment: Report given to RN, Post -op Vital signs reviewed and stable, Patient moving all extremities X 4 and Patient able to stick tongue midline  Post vital signs: Reviewed and stable  Last Vitals:  Vitals Value Taken Time  BP 152/83 03/18/21 1418  Temp 36.5 C 03/18/21 1417  Pulse 74 03/18/21 1421  Resp 9 03/18/21 1421  SpO2 94 % 03/18/21 1421  Vitals shown include unvalidated device data.  Last Pain:  Vitals:   03/18/21 0901  TempSrc:   PainSc: 3          Complications: No complications documented.

## 2021-03-18 NOTE — Op Note (Signed)
Brief history: The patient is a 70 year old white female who has complained of back and leg pain consistent with neurogenic claudication.  She has failed medical management and was worked up with lumbar x-rays and lumbar MRI which demonstrated L4-5 degenerative disc disease, spondylolisthesis, spinal stenosis, etc.  I discussed the various treatment options with her.  She has decided proceed with surgery after weighing the risk, benefits and alternatives.  Preoperative diagnosis: L4-5 spondylolisthesis, degenerative disc disease, spinal stenosis compressing both the L4 and the L5 nerve roots; lumbago; lumbar radiculopathy; neurogenic claudication  Postoperative diagnosis: The same  Procedure: Bilateral L4-5 laminotomy/foraminotomies/medial facetectomy to decompress the bilateral L4 and L5 nerve roots(the work required to do this was in addition to the work required to do the posterior lumbar interbody fusion because of the patient's spinal stenosis, facet arthropathy. Etc. requiring a wide decompression of the nerve roots.);  L4-5 transforaminal lumbar interbody fusion with local morselized autograft bone and Zimmer DBM; insertion of interbody prosthesis at L4-5 (globus peek expandable interbody prosthesis); posterior nonsegmental instrumentation from L4 to L5 with globus titanium pedicle screws and rods; posterior lateral arthrodesis at L4-5 with local morselized autograft bone and Zimmer DBM.  Surgeon: Dr. Earle Gell  Asst.: Dr. Kristeen Miss and Arnetha Massy, NP  Anesthesia: Gen. endotracheal  Estimated blood loss: 200 cc  Drains: None  Complications: None  Description of procedure: The patient was brought to the operating room by the anesthesia team. General endotracheal anesthesia was induced. The patient was turned to the prone position on the Wilson frame. The patient's lumbosacral region was then prepared with Betadine scrub and Betadine solution. Sterile drapes were applied.  I then  injected the area to be incised with Marcaine with epinephrine solution. I then used the scalpel to make a linear midline incision over the L4-5 interspace. I then used electrocautery to perform a bilateral subperiosteal dissection exposing the spinous process and lamina of L4 and L5. We then obtained intraoperative radiograph to confirm our location. We then inserted the Verstrac retractor to provide exposure.  I began the decompression by using the high speed drill to perform laminotomies at L4-5 bilaterally. We then used the Kerrison punches to widen the laminotomy and removed the ligamentum flavum at L4-5 bilaterally. We used the Kerrison punches to remove the medial facets at L4-5 bilaterally. We performed wide foraminotomies about the bilateral L4 and L5 nerve roots completing the decompression.  We now turned our attention to the posterior lumbar interbody fusion. I used a scalpel to incise the intervertebral disc at L4-5 bilaterally. I then performed a partial intervertebral discectomy at L4-5 bilaterally using the pituitary forceps. We prepared the vertebral endplates at Y4-0 bilaterally for the fusion by removing the soft tissues with the curettes. We then used the trial spacers to pick the appropriate sized interbody prosthesis. We prefilled his prosthesis with a combination of local morselized autograft bone that we obtained during the decompression as well as Zimmer DBM. We inserted the prefilled prosthesis into the interspace at L4-5, we then turned and expanded the prosthesis. There was a good snug fit of the prosthesis in the interspace. We then filled and the remainder of the intervertebral disc space with local morselized autograft bone and Zimmer DBM. This completed the posterior lumbar interbody arthrodesis.  During the decompression and insertion of the prosthesis the assistant protected the thecal sac and nerve roots with the D'Errico retractor.  We now turned attention to the  instrumentation. Under fluoroscopic guidance we cannulated the bilateral L4  and L5 pedicles with the bone probe. We then removed the bone probe. We then tapped the pedicle with a 5.5 millimeter tap. We then removed the tap. We probed inside the tapped pedicle with a ball probe to rule out cortical breaches. We then inserted a 6.5 x 50 millimeter pedicle screw into the L4 and L5 pedicles bilaterally under fluoroscopic guidance. We then palpated along the medial aspect of the pedicles to rule out cortical breaches. There were none. The nerve roots were not injured. We then connected the unilateral pedicle screws with a lordotic rod. We compressed the construct and secured the rod in place with the caps. We then tightened the caps appropriately. This completed the instrumentation from L4-5 bilaterally.  We now turned our attention to the posterior lateral arthrodesis at L4-5 bilaterally. We used the high-speed drill to decorticate the remainder of the facets, pars, transverse process at L4-5 bilaterally. We then applied a combination of local morselized autograft bone and Zimmer DBM over these decorticated posterior lateral structures. This completed the posterior lateral arthrodesis.  We then obtained hemostasis using bipolar electrocautery. We irrigated the wound out with bacitracin solution. We inspected the thecal sac and nerve roots and noted they were well decompressed. We then removed the retractor.  We injected Exparel . We reapproximated patient's thoracolumbar fascia with interrupted #1 Vicryl suture. We reapproximated patient's subcutaneous tissue with interrupted 2-0 Vicryl suture. The reapproximated patient's skin with Steri-Strips and benzoin. The wound was then coated with bacitracin ointment. A sterile dressing was applied. The drapes were removed. The patient was subsequently returned to the supine position where they were extubated by the anesthesia team. He was then transported to the post  anesthesia care unit in stable condition. All sponge instrument and needle counts were reportedly correct at the end of this case.

## 2021-03-19 ENCOUNTER — Telehealth: Payer: Self-pay

## 2021-03-19 DIAGNOSIS — E039 Hypothyroidism, unspecified: Secondary | ICD-10-CM | POA: Diagnosis not present

## 2021-03-19 DIAGNOSIS — M4316 Spondylolisthesis, lumbar region: Secondary | ICD-10-CM | POA: Diagnosis not present

## 2021-03-19 DIAGNOSIS — Z79899 Other long term (current) drug therapy: Secondary | ICD-10-CM | POA: Diagnosis not present

## 2021-03-19 DIAGNOSIS — E109 Type 1 diabetes mellitus without complications: Secondary | ICD-10-CM | POA: Diagnosis not present

## 2021-03-19 DIAGNOSIS — M48062 Spinal stenosis, lumbar region with neurogenic claudication: Secondary | ICD-10-CM | POA: Diagnosis not present

## 2021-03-19 DIAGNOSIS — M5116 Intervertebral disc disorders with radiculopathy, lumbar region: Secondary | ICD-10-CM | POA: Diagnosis not present

## 2021-03-19 LAB — CBC
HCT: 28.1 % — ABNORMAL LOW (ref 36.0–46.0)
Hemoglobin: 9.3 g/dL — ABNORMAL LOW (ref 12.0–15.0)
MCH: 28.7 pg (ref 26.0–34.0)
MCHC: 33.1 g/dL (ref 30.0–36.0)
MCV: 86.7 fL (ref 80.0–100.0)
Platelets: 327 10*3/uL (ref 150–400)
RBC: 3.24 MIL/uL — ABNORMAL LOW (ref 3.87–5.11)
RDW: 13.5 % (ref 11.5–15.5)
WBC: 9.8 10*3/uL (ref 4.0–10.5)
nRBC: 0 % (ref 0.0–0.2)

## 2021-03-19 LAB — BASIC METABOLIC PANEL
Anion gap: 7 (ref 5–15)
BUN: 13 mg/dL (ref 8–23)
CO2: 27 mmol/L (ref 22–32)
Calcium: 8.4 mg/dL — ABNORMAL LOW (ref 8.9–10.3)
Chloride: 95 mmol/L — ABNORMAL LOW (ref 98–111)
Creatinine, Ser: 0.77 mg/dL (ref 0.44–1.00)
GFR, Estimated: >60 mL/min (ref >60)
Glucose, Bld: 347 mg/dL — ABNORMAL HIGH (ref 70–99)
Potassium: 3.7 mmol/L (ref 3.5–5.1)
Sodium: 129 mmol/L — ABNORMAL LOW (ref 135–145)

## 2021-03-19 LAB — GLUCOSE, CAPILLARY
Glucose-Capillary: 407 mg/dL — ABNORMAL HIGH (ref 70–99)
Glucose-Capillary: 326 mg/dL — ABNORMAL HIGH (ref 70–99)
Glucose-Capillary: 130 mg/dL — ABNORMAL HIGH (ref 70–99)
Glucose-Capillary: 127 mg/dL — ABNORMAL HIGH (ref 70–99)
Glucose-Capillary: 181 mg/dL — ABNORMAL HIGH (ref 70–99)

## 2021-03-19 MED ORDER — CYCLOBENZAPRINE HCL 10 MG PO TABS: 10.0000 mg | ORAL_TABLET | Freq: Three times a day (TID) | ORAL | 0 refills | Status: DC | PRN

## 2021-03-19 MED ORDER — DOCUSATE SODIUM 100 MG PO CAPS: 100.0000 mg | ORAL_CAPSULE | Freq: Two times a day (BID) | ORAL | 0 refills | Status: DC

## 2021-03-19 MED ORDER — OXYCODONE-ACETAMINOPHEN 5-325 MG PO TABS: 1.0000 | ORAL_TABLET | ORAL | Status: DC | PRN

## 2021-03-19 MED ORDER — OXYCODONE HCL 5 MG PO TABS: 5.0000 mg | ORAL_TABLET | ORAL | 0 refills | Status: DC | PRN

## 2021-03-19 NOTE — Evaluation (Signed)
Occupational Therapy Evaluation Patient Details Name: Karla Patton MRN: [id number] DOB: May 03, 1951 Today's Date: 03/19/2021    History of Present Illness 70 y.o. female presenting with L4-L5 spondylolisthesis and spinal stenosis s/p PLIF. PMHx significant for anxiety, DMI and  thyroid disease.   Clinical Impression   PTA patient was living with her spouse in a private residence and was independent with ADLs/IADLs without AD. Patient currently presents near baseline level of function demonstrating all observed ADLs with Min guard to supervision A. OT provided education on spinal precautions, home set-up to maximize safety and independence with self-care tasks, and acquisition/use of AE. Patient expressed verbal understanding. Patient also able to doff/don lumbar brace without external assist. Patient does not require continued acute occupational therapy services with OT to sign off at this time.      Follow Up Recommendations  No OT follow up    Equipment Recommendations  None recommended by OT    Recommendations for Other Services       Precautions / Restrictions Precautions Precautions: Back Precaution Booklet Issued: Yes (comment) Precaution Comments: Reviewed written handout. Patient able to recall 2/3 back precautions at conclusion of session. Restrictions Weight Bearing Restrictions: No      Mobility Bed Mobility Overal bed mobility: Needs Assistance Bed Mobility: Rolling;Sidelying to Sit Rolling: Supervision Sidelying to sit: Supervision       General bed mobility comments: Supervision A for all parts of bed mobility with cues for log rolling technique.    Transfers Overall transfer level: Needs assistance Equipment used: None Transfers: Sit to/from Stand Sit to Stand: Min guard;Supervision         General transfer comment: Min guard from EOB positioned in lowest setting and supervision A from commode.    Balance Overall balance assessment: Needs  assistance Sitting-balance support: No upper extremity supported;Feet supported Sitting balance-Leahy Scale: Good Sitting balance - Comments: Able to don LB clothing seated EOB without LOB.   Standing balance support: No upper extremity supported;During functional activity Standing balance-Leahy Scale: Fair Standing balance comment: Mild posterior LOB upon initial sit to stand. Able to self correct without external assist.                           ADL either performed or assessed with clinical judgement   ADL Overall ADL's : Needs assistance/impaired     Grooming: Supervision/safety;Standing Grooming Details (indicate cue type and reason): 2/3 grooming standing at sink level with use of compensatory strategy.         Upper Body Dressing : Set up;Sitting   Lower Body Dressing: Sit to/from stand;Supervision/safety Lower Body Dressing Details (indicate cue type and reason): Supervision A for safety without AD. Toilet Transfer: Copy Details (indicate cue type and reason): Supervision A for safety without AD. Toileting- Clothing Manipulation and Hygiene: Supervision/safety Toileting - Clothing Manipulation Details (indicate cue type and reason): Supervision A for safety.     Functional mobility during ADLs: Supervision/safety General ADL Comments: Patient limited by mild balance deficits. Does not believe she needs DME for mobility.     Vision         Perception     Praxis      Pertinent Vitals/Pain Pain Assessment: 0-10 Pain Score: 2  Faces Pain Scale: Hurts little more Pain Location: Low back (incisional) Pain Descriptors / Indicators: Sore Pain Intervention(s): Limited activity within patient's tolerance;Monitored during session;Premedicated before session;Repositioned     Hand Dominance Right  Extremity/Trunk Assessment Upper Extremity Assessment Upper Extremity Assessment: Overall WFL for tasks assessed   Lower  Extremity Assessment Lower Extremity Assessment: Defer to PT evaluation   Cervical / Trunk Assessment Cervical / Trunk Assessment: Other exceptions Cervical / Trunk Exceptions: s/p spinal surgery.   Communication Communication Communication: No difficulties   Cognition Arousal/Alertness: Awake/alert Behavior During Therapy: WFL for tasks assessed/performed Overall Cognitive Status: Within Functional Limits for tasks assessed                                     General Comments  Cleand, dry dressing at incision.    Exercises     Shoulder Instructions      Home Living Family/patient expects to be discharged to:: Private residence Living Arrangements: Spouse/significant other Available Help at Discharge: Family;Available 24 hours/day (Husband is retired.) Type of Home: House Home Access: Stairs to enter [employer] of Steps: 5 STE from garage Entrance Stairs-Rails: Right;Left;Can reach both [place]: One level     Bathroom Shower/Tub: Occupational psychologist: Handicapped height     Home Equipment: Clinical cytogeneticist - 4 wheels;Walker - 2 wheels;Cane - single point;Hospital bed (daybed with rails, RW x2, hoyer lift (DME all from father))   Additional Comments: 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      Prior Functioning/Environment Level of Independence: Independent        Comments: Independent with ADLs/IADLs; drives; shared responsibility for cooking/cleaning.        OT Problem List:        OT Treatment/Interventions:      OT Goals(Current goals can be found in the care plan section) Acute Rehab OT Goals Patient Stated Goal: To return home. OT Goal Formulation: With patient  OT Frequency:     Barriers to D/C:            Co-evaluation              AM-PAC OT "6 Clicks" Daily Activity     Outcome Measure Help from another person eating meals?: None Help from another  person taking care of personal grooming?: A Little Help from another person toileting, which includes using toliet, bedpan, or urinal?: A Little Help from another person bathing (including washing, rinsing, drying)?: A Little Help from another person to put on and taking off regular upper body clothing?: A Little Help from another person to put on and taking off regular lower body clothing?: A Little 6 Click Score: 19   End of Session Equipment Utilized During Treatment: Back brace Nurse Communication: Mobility status  Activity Tolerance: Patient tolerated treatment well Patient left: in chair;with call bell/phone within reach  OT Visit Diagnosis: Muscle weakness (generalized) (M62.81)                Time: 4259-5638 OT Time Calculation (min): 30 min Charges:  OT General Charges $OT Visit: 1 Visit OT Evaluation $OT Eval Low Complexity: 1 Low OT Treatments $Self Care/Home Management : 8-22 mins  Tatem Holsonback H. OTR/L Supplemental OT, Department of rehab services [phone]  Kemani Heidel R H. 03/19/2021, 11:53 AM

## 2021-03-19 NOTE — Progress Notes (Signed)
Physical Therapy Treatment Patient Details Name: Karla Patton MRN: [id number] DOB: 29-Aug-1951 Today's Date: 03/19/2021    History of Present Illness Pt is a 70 y/o female who presents s/p L4-L5 PLIF on 03/18/2021. PMH significant for hypothyroidism, DM.    PT Comments    Pt admitted with above diagnosis. At the time of PT eval, pt was able to demonstrate transfers and ambulation with up to min assist without an AD. Gait speed and quality of gait improved as session progressed however overall with heavy landing and uncoordinated heel strikes (L worse than R). Pt was educated on precautions, brace application/wearing schedule, appropriate activity progression, and car transfer. Pt currently with functional limitations due to the deficits listed below (see PT Problem List). Pt will benefit from skilled PT to increase their independence and safety with mobility to allow discharge to the venue listed below.      Follow Up Recommendations  No PT follow up;Supervision - Intermittent     Equipment Recommendations  None recommended by PT    Recommendations for Other Services       Precautions / Restrictions Precautions Precautions: Fall;Back Precaution Booklet Issued: Yes (comment) Precaution Comments: Reviewed handout and pt was cued for precautions during functional mobility. Restrictions Weight Bearing Restrictions: No    Mobility  Bed Mobility Overal bed mobility: Modified Independent Bed Mobility: Rolling;Sidelying to Sit;Sit to Sidelying Rolling: Supervision Sidelying to sit: Supervision       General bed mobility comments: HOB flat and rails lowered to simulate home environment. No assist required. Pt does have a day bed that she can use with rails if needed.    Transfers Overall transfer level: Needs assistance Equipment used: None Transfers: Sit to/from Stand Sit to Stand: Min guard         General transfer comment: Light guard for safety as pt powered up to full  stand. Min cues for improved posture and to avoid excessive bending.  Ambulation/Gait Ambulation/Gait assistance: Min guard;Min assist Gait Distance (Feet): 300 Feet Assistive device: None Gait Pattern/deviations: Step-through pattern;Decreased stride length;Trunk flexed Gait velocity: Decreased Gait velocity interpretation: <1.31 ft/sec, indicative of household ambulator General Gait Details: Min assist initially progressing to min guard assist for balance support and safety. Noted steps were heavy with decreased heel strike and decreased control of LE contact with the ground (L worse than R).   Stairs Stairs: Yes Stairs assistance: Min guard;Min assist Stair Management: One rail Left;Alternating pattern;Forwards Number of Stairs: 10 General stair comments: VC's for sequencing and recommendation of step-to pattern as pt appeared very effortful with each advancing step. Pt preferred attempting alternating step pattern with 1 instance of unsteadiness requiring min assist to recover.   Wheelchair Mobility    Modified Rankin (Stroke Patients Only)       Balance Overall balance assessment: Needs assistance Sitting-balance support: No upper extremity supported;Feet supported Sitting balance-Leahy Scale: Fair     Standing balance support: No upper extremity supported;During functional activity Standing balance-Leahy Scale: Fair                              Cognition Arousal/Alertness: Awake/alert Behavior During Therapy: WFL for tasks assessed/performed Overall Cognitive Status: Within Functional Limits for tasks assessed                                        Exercises  General Comments        Pertinent Vitals/Pain Pain Assessment: Faces Pain Score: 2  Faces Pain Scale: Hurts little more Pain Location: Low back (incisional) Pain Descriptors / Indicators: Operative site guarding;Sore Pain Intervention(s): Limited activity within  patient's tolerance;Monitored during session;Repositioned    Home Living Family/patient expects to be discharged to:: Private residence Living Arrangements: Spouse/significant other Available Help at Discharge: Family;Available 24 hours/day Type of Home: House Home Access: Stairs to enter Entrance Stairs-Rails: Right;Left;Can reach both Home Layout: One level Home Equipment: Clinical cytogeneticist - 4 wheels;Walker - 2 wheels;Cane - single point;Hospital bed Additional Comments: 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    Prior Function Level of Independence: Independent      Comments: Independent with ADLs/IADLs; drives; shared responsibility for cooking/cleaning.   PT Goals (current goals can now be found in the care plan section) Acute Rehab PT Goals Patient Stated Goal: Go home today PT Goal Formulation: With patient Time For Goal Achievement: 03/26/21 Potential to Achieve Goals: Good    Frequency    Min 5X/week      PT Plan      Co-evaluation              AM-PAC PT "6 Clicks" Mobility   Outcome Measure  Help needed turning from your back to your side while in a flat bed without using bedrails?: None Help needed moving from lying on your back to sitting on the side of a flat bed without using bedrails?: None Help needed moving to and from a bed to a chair (including a wheelchair)?: A Little Help needed standing up from a chair using your arms (e.g., wheelchair or bedside chair)?: A Little Help needed to walk in hospital room?: A Little Help needed climbing 3-5 steps with a railing? : A Little 6 Click Score: 20    End of Session Equipment Utilized During Treatment: Gait belt;Back brace Activity Tolerance: Patient tolerated treatment well Patient left: in bed;with call bell/phone within reach Nurse Communication: Mobility status PT Visit Diagnosis: Unsteadiness on feet (R26.81);Pain Pain - part of body:  (back)     Time:  0355-9741 PT Time Calculation (min) (ACUTE ONLY): 39 min  Charges:  $Gait Training: 23-37 mins                     Karla Patton, PT, DPT Acute Rehabilitation Services Pager: [phone] Office: [phone]    Karla Patton 03/19/2021, 11:45 AM

## 2021-03-19 NOTE — Care Management CC44 (Signed)
Condition Code 44 Documentation Completed  Patient Details  Name: TATJANA TURCOTT MRN: [id number] Date of Birth: [birth year]   Condition Code 44 given:  Yes Patient signature on Condition Code 44 notice:  Yes Documentation of 2 MD's agreement:  Yes Code 44 added to claim:  Yes    Angelita Ingles, RN 03/19/2021, 2:42 PM

## 2021-03-19 NOTE — Progress Notes (Signed)
Chronic Care Management Pharmacy Assistant   Name: Karla Patton  MRN: [id number] DOB: Dec 18, 1951  Reason for Encounter: Medication Review   Medications: Outpatient Encounter Medications as of 03/19/2021  Medication Sig Note  . acetaminophen (TYLENOL) 500 MG tablet Take 1,000 mg by mouth daily as needed.   . ALPRAZolam (XANAX) 0.5 MG tablet TAKE 1 TABLET(0.5 MG) BY MOUTH TWICE DAILY (Patient taking differently: Take 0.5 mg by mouth at bedtime.)   . APPLE CIDER VINEGAR PO Take 600 mg by mouth daily.    [name] aspirin EC 81 MG tablet Take 81 mg by mouth daily. Takes at bedtime   . beta carotene 25000 UNIT capsule Take 25,000 Units by mouth daily.   . Cholecalciferol (VITAMIN D3) 250 MCG (10000 UT) capsule Take 10,000 Units by mouth daily.   . Coenzyme Q10 (COQ10) 100 MG CAPS Take 100 mg by mouth daily. With cinnamon   . Continuous Blood Gluc Sensor (FREESTYLE LIBRE 14 DAY SENSOR) MISC To check blood sugar ACHS, remove and replace every 14 days   . CONTOUR NEXT TEST test strip CHECK BLOOD SUGAR BEFORE MEALS AND AT BEDTIME   . cyclobenzaprine (FLEXERIL) 10 MG tablet Take 1 tablet (10 mg total) by mouth 3 (three) times daily as needed for muscle spasms.   [name] docusate sodium (COLACE) 100 MG capsule Take 1 capsule (100 mg total) by mouth 2 (two) times daily.   . Ferrous Sulfate Dried (HIGH POTENCY IRON) 65 MG TABS Take 1 tablet by mouth daily.   . furosemide (LASIX) 20 MG tablet TAKE 1 TABLET(20 MG) BY MOUTH DAILY (Patient taking differently: Take 20 mg by mouth daily. TAKE 1 TABLET(20 MG) BY MOUTH DAILY)   . insulin aspart (NOVOLOG FLEXPEN) 100 UNIT/ML FlexPen Inject 5-10 Units into the skin 3 (three) times daily with meals. Per sliding scale   . insulin glargine (LANTUS SOLOSTAR) 100 UNIT/ML Solostar Pen Inject 10 Units into the skin 2 (two) times daily.   . Insulin Pen Needle (B-D ULTRAFINE III SHORT PEN) 31G X 8 MM MISC USE AS DIRECTED FOUR TIMES DAILY, BEFORE MEALS AND AT BEDTIME   .  levothyroxine (SYNTHROID) 100 MCG tablet TAKE 1 TABLET BY MOUTH DAILY BEFORE BREAKFAST (Patient taking differently: Take 100 mcg by mouth daily before breakfast. TAKE 1 TABLET BY MOUTH DAILY BEFORE BREAKFAST)   . MAGNESIUM-ZINC PO Take 1 tablet by mouth daily.   . Multiple Vitamins-Minerals (ABC PLUS SENIOR ADULTS 50+ PO) Take 1 tablet by mouth daily. 02/26/2021: Centrum Silver   . omeprazole (PRILOSEC) 40 MG capsule Take 1 capsule (40 mg total) by mouth daily.   [name] oxyCODONE (OXY IR/ROXICODONE) 5 MG immediate release tablet Take 1 tablet (5 mg total) by mouth every 4 (four) hours as needed for moderate pain ((score 4 to 6)).   [name] polyethylene glycol (MIRALAX / GLYCOLAX) 17 g packet Take 17 g by mouth daily as needed.   . potassium chloride SA (KLOR-CON) 20 MEQ tablet TAKE 1 TABLET BY MOUTH EVERY DAY. TAKE WITH FUROSEMIDE WHEN NEEDED AS DIRECTED (Patient taking differently: Take 20 mEq by mouth daily. TAKE 1 TABLET BY MOUTH EVERY DAY. TAKE WITH FUROSEMIDE WHEN NEEDED AS DIRECTED)   . pregabalin (LYRICA) 225 MG capsule TAKE 1 CAPSULE(225 MG) BY MOUTH DAILY (Patient taking differently: Take 225 mg by mouth daily.) 03/15/2021: Takes at bedtime   Facility-Administered Encounter Medications as of 03/19/2021  Medication  . 0.9 %  sodium chloride infusion  . acetaminophen (TYLENOL) tablet 650 mg  Or  . acetaminophen (TYLENOL) suppository 650 mg  . bisacodyl (DULCOLAX) suppository 10 mg  . cholecalciferol (VITAMIN D3) tablet 10,000 Units  . cyclobenzaprine (FLEXERIL) tablet 10 mg  . docusate sodium (COLACE) capsule 100 mg  . ferrous sulfate tablet 325 mg  . furosemide (LASIX) tablet 20 mg  . insulin aspart (novoLOG) injection 0-20 Units  . insulin aspart (novoLOG) injection 0-5 Units  . insulin glargine (LANTUS) injection 10 Units  . levothyroxine (SYNTHROID) tablet 100 mcg  . menthol-cetylpyridinium (CEPACOL) lozenge 3 mg   Or  . phenol (CHLORASEPTIC) mouth spray 1 spray  . morphine 4 MG/ML injection  4 mg  . ondansetron (ZOFRAN) tablet 4 mg   Or  . ondansetron (ZOFRAN) injection 4 mg  . oxyCODONE (Oxy IR/ROXICODONE) immediate release tablet 10 mg  . oxyCODONE (Oxy IR/ROXICODONE) immediate release tablet 5 mg  . oxyCODONE-acetaminophen (PERCOCET/ROXICET) 5-325 MG per tablet 1-2 tablet  . pantoprazole (PROTONIX) EC tablet 80 mg  . polyethylene glycol (MIRALAX / GLYCOLAX) packet 17 g  . potassium chloride SA (KLOR-CON) CR tablet 20 mEq  . pregabalin (LYRICA) capsule 225 mg  . sodium chloride flush (NS) 0.9 % injection 3 mL  . sodium chloride flush (NS) 0.9 % injection 3 mL  . zolpidem (AMBIEN) tablet 5 mg     Reviewed chart and adherence measures. Per insurance data patient is current with her Care Gaps.  [place] Pharmacist Assistant [phone]

## 2021-03-19 NOTE — Progress Notes (Signed)
Subjective: The patient is alert and pleasant.  She looks well.  She wants to go home.  Objective: Vital signs in last 24 hours: Temp:  [97.7 F (36.5 C)-98.9 F (37.2 C)] 97.8 F (36.6 C) (03/29 0737) Pulse Rate:  [71-110] 76 (03/29 0737) Resp:  [13-20] 16 (03/29 0737) BP: (99-193)/(57-85) 111/59 (03/29 0737) SpO2:  [95 %-100 %] 97 % (03/29 0737) Weight:  [88 kg] 88 kg (03/28 0823) Estimated body mass index is 32.28 kg/m as calculated from the following:   Height as of this encounter: 5\' 5"  (1.651 m).   Weight as of this encounter: 88 kg.   Intake/Output from previous day: 03/28 0701 - 03/29 0700 In: 1800 [I.V.:1100; IV Piggyback:700] Out: 780 [Urine:580; Blood:200] Intake/Output this shift: No intake/output data recorded.  Physical exam the patient is alert and oriented.  Her strength is normal.  She looks well.  Lab Results: Recent Labs    03/18/21 0937 03/18/21 1238 03/19/21 0420  WBC 5.7  --  9.8  HGB 12.9 10.9* 9.3*  HCT 38.6 32.0* 28.1*  PLT 443*  --  327   BMET Recent Labs    03/18/21 0937 03/18/21 1238 03/19/21 0420  NA 133* 134* 129*  K 3.7 3.9 3.7  CL 97* 97* 95*  CO2 28  --  27  GLUCOSE 148* 195* 347*  BUN 10 10 13   CREATININE 0.65 0.50 0.77  CALCIUM 9.9  --  8.4*    Studies/Results: DG Lumbar Spine 2-3 Views  Result Date: 03/18/2021 CLINICAL DATA:  Elective surgery. Additional history provided: Lumbar 4-5 posterior lumbar interbody fusion, interbody prosthesis, posterior instrumentation. Provided fluoroscopy time 20 seconds (17.80 mGy). EXAM: LUMBAR SPINE - 2-3 VIEW; DG C-ARM 1-60 MIN COMPARISON:  Lateral view intraoperative radiographs of the lumbar spine 03/18/2021. Lumbar spine MRI 09/17/2020. FINDINGS: AP and lateral view intraoperative fluoroscopic images of the lumbar spine are submitted, 4 images total. The images demonstrate bilateral pedicle screws at L4 and L5. Vertical interconnecting rods were not present at the time the images were  taken. An L4-L5 interbody device is also present. Overlying retractors. IMPRESSION: Four intraoperative fluoroscopic images of the lumbar spine from L4-L5 fusion, as described. Electronically Signed   By: Kellie Simmering DO   On: 03/18/2021 14:02   DG Lumbar Spine 1 View  Result Date: 03/18/2021 CLINICAL DATA:  Intraop opening image for L4-L5 fusion EXAM: LUMBAR SPINE - 1 VIEW COMPARISON:  None. FINDINGS: Single lateral image demonstrates localizer dorsal to the L4-L5 disc space overlying the posterior elements between the L4 and L5 spinous process levels. IMPRESSION: Localizer as above. Electronically Signed   By: Macy Mis M.D.   On: 03/18/2021 15:48   DG C-Arm 1-60 Min  Result Date: 03/18/2021 CLINICAL DATA:  Elective surgery. Additional history provided: Lumbar 4-5 posterior lumbar interbody fusion, interbody prosthesis, posterior instrumentation. Provided fluoroscopy time 20 seconds (17.80 mGy). EXAM: LUMBAR SPINE - 2-3 VIEW; DG C-ARM 1-60 MIN COMPARISON:  Lateral view intraoperative radiographs of the lumbar spine 03/18/2021. Lumbar spine MRI 09/17/2020. FINDINGS: AP and lateral view intraoperative fluoroscopic images of the lumbar spine are submitted, 4 images total. The images demonstrate bilateral pedicle screws at L4 and L5. Vertical interconnecting rods were not present at the time the images were taken. An L4-L5 interbody device is also present. Overlying retractors. IMPRESSION: Four intraoperative fluoroscopic images of the lumbar spine from L4-L5 fusion, as described. Electronically Signed   By: Kellie Simmering DO   On: 03/18/2021 14:02  Assessment/Plan: Postop day #1: The patient is doing well except for her diabetes.  Diabetes: The patient's blood glucose was over 400 this morning.  She got 20 units of insulin.  I discussed this with her.  She says "I have been doing this my entire life", she has an insulin pump, etc.  The plan is to get her blood sugar under better control and  likely send her home later today.  I gave her discharge instructions and answered all questions.  LOS: 1 day     Ophelia Charter 03/19/2021, 7:40 AM

## 2021-03-19 NOTE — Discharge Instructions (Signed)

## 2021-03-19 NOTE — Discharge Summary (Signed)
Physician Discharge Summary     Providing Compassionate, Quality Care - Together   Patient ID: Karla Patton MRN: [id number] DOB/AGE: [birth year] 70 y.o.  Admit date: 03/18/2021 Discharge date: 03/19/2021  Admission Diagnoses: Spondylolisthesis, lumbar region  Discharge Diagnoses:  Active Problems:   Spondylolisthesis, lumbar region   Discharged Condition: good  Hospital Course: Patient underwent an L4-5  posterior lateral interbody fusion by Dr. Arnoldo Morale on  03/18/2021. She was admitted to 3C07  following recovery from anesthesia in the PACU. Her postoperative course  Was complicated by hyperglycemia. Her blood glucose level is now  within an acceptable range. She is followed by her primary care provider for her type 2 diabetes mellitus. She has worked with both physical and occupational therapies who feel the patient is ready for discharge home. She is ambulating independently and without difficulty. She is tolerating a normal diet. She is not having any bowel or bladder dysfunction. Her pain is well-controlled with oral pain medication. She is ready for discharge to home.   Consults: None  Significant Diagnostic Studies: radiology: DG Lumbar Spine 2-3 Views  Result Date: 03/18/2021 CLINICAL DATA:  Elective surgery. Additional history provided: Lumbar 4-5 posterior lumbar interbody fusion, interbody prosthesis, posterior instrumentation. Provided fluoroscopy time 20 seconds (17.80 mGy). EXAM: LUMBAR SPINE - 2-3 VIEW; DG C-ARM 1-60 MIN COMPARISON:  Lateral view intraoperative radiographs of the lumbar spine 03/18/2021. Lumbar spine MRI 09/17/2020. FINDINGS: AP and lateral view intraoperative fluoroscopic images of the lumbar spine are submitted, 4 images total. The images demonstrate bilateral pedicle screws at L4 and L5. Vertical interconnecting rods were not present at the time the images were taken. An L4-L5 interbody device is also present. Overlying retractors. IMPRESSION: Four  intraoperative fluoroscopic images of the lumbar spine from L4-L5 fusion, as described. Electronically Signed   By: Kellie Simmering DO   On: 03/18/2021 14:02   DG Lumbar Spine 1 View  Result Date: 03/18/2021 CLINICAL DATA:  Intraop opening image for L4-L5 fusion EXAM: LUMBAR SPINE - 1 VIEW COMPARISON:  None. FINDINGS: Single lateral image demonstrates localizer dorsal to the L4-L5 disc space overlying the posterior elements between the L4 and L5 spinous process levels. IMPRESSION: Localizer as above. Electronically Signed   By: Macy Mis M.D.   On: 03/18/2021 15:48   DG C-Arm 1-60 Min  Result Date: 03/18/2021 CLINICAL DATA:  Elective surgery. Additional history provided: Lumbar 4-5 posterior lumbar interbody fusion, interbody prosthesis, posterior instrumentation. Provided fluoroscopy time 20 seconds (17.80 mGy). EXAM: LUMBAR SPINE - 2-3 VIEW; DG C-ARM 1-60 MIN COMPARISON:  Lateral view intraoperative radiographs of the lumbar spine 03/18/2021. Lumbar spine MRI 09/17/2020. FINDINGS: AP and lateral view intraoperative fluoroscopic images of the lumbar spine are submitted, 4 images total. The images demonstrate bilateral pedicle screws at L4 and L5. Vertical interconnecting rods were not present at the time the images were taken. An L4-L5 interbody device is also present. Overlying retractors. IMPRESSION: Four intraoperative fluoroscopic images of the lumbar spine from L4-L5 fusion, as described. Electronically Signed   By: Kellie Simmering DO   On: 03/18/2021 14:02     Treatments: surgery: Bilateral L4-5 laminotomy/foraminotomies/medial facetectomy to decompress the bilateral L4 and L5 nerve roots(the work required to do this was in addition to the work required to do the posterior lumbar interbody fusion because of the patient's spinal stenosis, facet arthropathy. Etc. requiring a wide decompression of the nerve roots.);  L4-5 transforaminal lumbar interbody fusion with local morselized autograft bone and  Zimmer DBM; insertion of  interbody prosthesis at L4-5 (globus peek expandable interbody prosthesis); posterior nonsegmental instrumentation from L4 to L5 with globus titanium pedicle screws and rods; posterior lateral arthrodesis at L4-5 with local morselized autograft bone and Zimmer DBM.  Discharge Exam: Blood pressure (!) 125/56, pulse 89, temperature 98.2 F (36.8 C), temperature source Oral, resp. rate 20, height 5\' 5"  (1.651 m), weight 88 kg, SpO2 97 %.   Per report: Physical exam the patient is alert and oriented. Her strength is normal.  She looks well.   Disposition: Discharge disposition: 01-Home or Self Care        Allergies as of 03/19/2021   No Known Allergies     Medication List    STOP taking these medications   ibuprofen 200 MG tablet Commonly known as: ADVIL   Lysine 500 MG Tabs   naproxen sodium 220 MG tablet Commonly known as: ALEVE   Turmeric 500 MG Caps     TAKE these medications   ABC PLUS SENIOR ADULTS 50+ PO Take 1 tablet by mouth daily.   acetaminophen 500 MG tablet Commonly known as: TYLENOL Take 1,000 mg by mouth daily as needed.   ALPRAZolam 0.5 MG tablet Commonly known as: XANAX TAKE 1 TABLET(0.5 MG) BY MOUTH TWICE DAILY What changed: See the new instructions.   APPLE CIDER VINEGAR PO Take 600 mg by mouth daily.   aspirin EC 81 MG tablet Take 81 mg by mouth daily. Takes at bedtime   beta carotene 25000 UNIT capsule Take 25,000 Units by mouth daily.   Contour Next Test test strip Generic drug: glucose blood CHECK BLOOD SUGAR BEFORE MEALS AND AT BEDTIME   CoQ10 100 MG Caps Take 100 mg by mouth daily. With cinnamon   cyclobenzaprine 10 MG tablet Commonly known as: FLEXERIL Take 1 tablet (10 mg total) by mouth 3 (three) times daily as needed for muscle spasms.   docusate sodium 100 MG capsule Commonly known as: COLACE Take 1 capsule (100 mg total) by mouth 2 (two) times daily.   FreeStyle Libre 14 Day Sensor Misc To  check blood sugar ACHS, remove and replace every 14 days   furosemide 20 MG tablet Commonly known as: LASIX TAKE 1 TABLET(20 MG) BY MOUTH DAILY What changed:   how much to take  how to take this  when to take this   High Potency Iron 65 MG Tabs Take 1 tablet by mouth daily.   Insulin Pen Needle 31G X 8 MM Misc Commonly known as: B-D ULTRAFINE III SHORT PEN USE AS DIRECTED FOUR TIMES DAILY, BEFORE MEALS AND AT BEDTIME   Lantus SoloStar 100 UNIT/ML Solostar Pen Generic drug: insulin glargine Inject 10 Units into the skin 2 (two) times daily.   levothyroxine 100 MCG tablet Commonly known as: SYNTHROID TAKE 1 TABLET BY MOUTH DAILY BEFORE BREAKFAST What changed:   how much to take  how to take this  when to take this   MAGNESIUM-ZINC PO Take 1 tablet by mouth daily.   NovoLOG FlexPen 100 UNIT/ML FlexPen Generic drug: insulin aspart Inject 5-10 Units into the skin 3 (three) times daily with meals. Per sliding scale   omeprazole 40 MG capsule Commonly known as: PRILOSEC Take 1 capsule (40 mg total) by mouth daily.   oxyCODONE 5 MG immediate release tablet Commonly known as: Oxy IR/ROXICODONE Take 1 tablet (5 mg total) by mouth every 4 (four) hours as needed for moderate pain ((score 4 to 6)).   polyethylene glycol 17 g packet Commonly known as:  MIRALAX / GLYCOLAX Take 17 g by mouth daily as needed.   potassium chloride SA 20 MEQ tablet Commonly known as: KLOR-CON TAKE 1 TABLET BY MOUTH EVERY DAY. TAKE WITH FUROSEMIDE WHEN NEEDED AS DIRECTED What changed:   how much to take  how to take this  when to take this   pregabalin 225 MG capsule Commonly known as: LYRICA TAKE 1 CAPSULE(225 MG) BY MOUTH DAILY What changed: See the new instructions.   Vitamin D3 250 MCG (10000 UT) capsule Take 10,000 Units by mouth daily.       Follow-up Information    Newman Pies, MD. Schedule an appointment as soon as possible for a visit in 3 week(s).   Specialty:  Neurosurgery Contact information: [address] [place] [address] [phone]               Signed: Viona Gilmore, DNP, AGNP-C Nurse Practitioner  [facility] [place] [address], [place], [place] [address] P: [phone]    F: [phone]  03/19/2021, 2:19 PM

## 2021-03-19 NOTE — Care Management Obs Status (Signed)
[place] NOTIFICATION   Patient Details  Name: Karla Patton MRN: [id number] Date of Birth: 30-Apr-1951   Medicare Observation Status Notification Given:  Yes    Angelita Ingles, RN 03/19/2021, 2:42 PM

## 2021-03-19 NOTE — Progress Notes (Signed)
Patient is discharged from room 3C07 at this time. Alert and in stable condition. IV site d/c'd and instructions read to patient and spouse with understanding verbalized and all questions answered. Left unit via wheelchair with all belongings at side. 

## 2021-03-21 MED FILL — Heparin Sodium (Porcine) Inj 1000 Unit/ML: INTRAMUSCULAR | Qty: 30 | Status: AC

## 2021-03-21 MED FILL — Sodium Chloride IV Soln 0.9%: INTRAVENOUS | Qty: 1000 | Status: AC

## 2021-03-29 ENCOUNTER — Ambulatory Visit (INDEPENDENT_AMBULATORY_CARE_PROVIDER_SITE_OTHER): Payer: Medicare Other

## 2021-03-29 DIAGNOSIS — E1059 Type 1 diabetes mellitus with other circulatory complications: Secondary | ICD-10-CM | POA: Diagnosis not present

## 2021-03-29 DIAGNOSIS — E103293 Type 1 diabetes mellitus with mild nonproliferative diabetic retinopathy without macular edema, bilateral: Secondary | ICD-10-CM | POA: Diagnosis not present

## 2021-03-29 DIAGNOSIS — I152 Hypertension secondary to endocrine disorders: Secondary | ICD-10-CM | POA: Diagnosis not present

## 2021-03-29 DIAGNOSIS — Z981 Arthrodesis status: Secondary | ICD-10-CM

## 2021-03-29 NOTE — Chronic Care Management (AMB) (Addendum)
Chronic Care Management   Follow Up Note   03/29/2021 Name: Karla Patton MRN: [id number] DOB: 1951-08-29  Primary Care Provider: [name], MD Reason for referral : Chronic Case Management   Karla Patton is a 70 y.o. year old female who was recently transferred to [name], MD.   Review of Ms. Shiffer' status, including review of consultants reports, relevant labs and test results was conducted today. Collaboration with appropriate care team members was performed as part of the comprehensive evaluation and provision of chronic care management services.    SDOH (Social Determinants of Health) assessments performed: No    Outpatient Encounter Medications as of 03/29/2021  Medication Sig Note  . ALPRAZolam (XANAX) 0.5 MG tablet TAKE 1 TABLET(0.5 MG) BY MOUTH TWICE DAILY (Patient taking differently: Take 0.5 mg by mouth at bedtime.) 03/29/2021: Taking as prescribed  . acetaminophen (TYLENOL) 500 MG tablet Take 1,000 mg by mouth daily as needed.   . APPLE CIDER VINEGAR PO Take 600 mg by mouth daily.    [name] aspirin EC 81 MG tablet Take 81 mg by mouth daily. Takes at bedtime   . beta carotene 25000 UNIT capsule Take 25,000 Units by mouth daily.   . Cholecalciferol (VITAMIN D3) 250 MCG (10000 UT) capsule Take 10,000 Units by mouth daily.   . Coenzyme Q10 (COQ10) 100 MG CAPS Take 100 mg by mouth daily. With cinnamon   . Continuous Blood Gluc Sensor (FREESTYLE LIBRE 14 DAY SENSOR) MISC To check blood sugar ACHS, remove and replace every 14 days   . CONTOUR NEXT TEST test strip CHECK BLOOD SUGAR BEFORE MEALS AND AT BEDTIME   . cyclobenzaprine (FLEXERIL) 10 MG tablet Take 1 tablet (10 mg total) by mouth 3 (three) times daily as needed for muscle spasms.   [name] docusate sodium (COLACE) 100 MG capsule Take 1 capsule (100 mg total) by mouth 2 (two) times daily.   . Ferrous Sulfate Dried (HIGH POTENCY IRON) 65 MG TABS Take 1 tablet by mouth daily.   . furosemide (LASIX) 20 MG tablet TAKE 1  TABLET(20 MG) BY MOUTH DAILY (Patient taking differently: Take 20 mg by mouth daily. TAKE 1 TABLET(20 MG) BY MOUTH DAILY)   . insulin aspart (NOVOLOG FLEXPEN) 100 UNIT/ML FlexPen Inject 5-10 Units into the skin 3 (three) times daily with meals. Per sliding scale   . insulin glargine (LANTUS SOLOSTAR) 100 UNIT/ML Solostar Pen Inject 10 Units into the skin 2 (two) times daily.   . Insulin Pen Needle (B-D ULTRAFINE III SHORT PEN) 31G X 8 MM MISC USE AS DIRECTED FOUR TIMES DAILY, BEFORE MEALS AND AT BEDTIME   . levothyroxine (SYNTHROID) 100 MCG tablet TAKE 1 TABLET BY MOUTH DAILY BEFORE BREAKFAST (Patient taking differently: Take 100 mcg by mouth daily before breakfast. TAKE 1 TABLET BY MOUTH DAILY BEFORE BREAKFAST)   . MAGNESIUM-ZINC PO Take 1 tablet by mouth daily.   . Multiple Vitamins-Minerals (ABC PLUS SENIOR ADULTS 50+ PO) Take 1 tablet by mouth daily. 02/26/2021: Centrum Silver   . omeprazole (PRILOSEC) 40 MG capsule Take 1 capsule (40 mg total) by mouth daily.   [name] oxyCODONE (OXY IR/ROXICODONE) 5 MG immediate release tablet Take 1 tablet (5 mg total) by mouth every 4 (four) hours as needed for moderate pain ((score 4 to 6)).   [name] polyethylene glycol (MIRALAX / GLYCOLAX) 17 g packet Take 17 g by mouth daily as needed.   . potassium chloride SA (KLOR-CON) 20 MEQ tablet TAKE 1 TABLET BY MOUTH EVERY  DAY. TAKE WITH FUROSEMIDE WHEN NEEDED AS DIRECTED (Patient taking differently: Take 20 mEq by mouth daily. TAKE 1 TABLET BY MOUTH EVERY DAY. TAKE WITH FUROSEMIDE WHEN NEEDED AS DIRECTED)   . pregabalin (LYRICA) 225 MG capsule TAKE 1 CAPSULE(225 MG) BY MOUTH DAILY (Patient taking differently: Take 225 mg by mouth daily.) 03/15/2021: Takes at bedtime   No facility-administered encounter medications on file as of 03/29/2021.     Objective:  Patient Care Plan: Diabetes Type 1 (Adult)    Problem Identified: Glycemic Management (Diabetes, Type 1)     Long-Range Goal: Glycemic Management Optimized   Start Date:  02/21/2021  Expected End Date: 06/21/2021  Priority: High  Note:   Objective:   Lab Results  Component Value Date   HGBA1C 7.9 (H) 03/18/2021    Lab Results  Component Value Date   HGBA1C 6.8 (A) 01/28/2021   Lab Results  Component Value Date   CREATININE 0.77 03/19/2021    Lab Results  Component Value Date   CREATININE 0.75 01/28/2021   CREATININE 0.74 01/20/2020   CREATININE 0.76 12/06/2018   . No results found for: EGFR Current Barriers:  . Chronic Disease Management support and educational needs r/t Diabetes.  Case Manager Clinical Goal(s):  [name] Over the next 120 days, patient not require emergent care d/t complications r/t hypoglycemia. . Over the next 120 days, patient will demonstrate adherence to prescribed treatment plan for Diabetes self management as evidenced by consistently assessing readings on her continuous glucose monitoring device, taking medications as prescribed and adhering to the recommended ADA/carb modified diet.   Interventions:  . Collaboration with [name], MD regarding development and update of comprehensive plan of care as evidenced by provider attestation and co-signature . Inter-disciplinary care team collaboration (see longitudinal plan of care) . Reviewed compliance with current treatment plan. Reports excellent compliance with medications and insulin administration.  She was previously using a [employer]. The CCM Pharmacist assisted her with transitioning to a Dexcom G6 monitoring device. Reports a fasting range of 95 to 179 mg/dl over the past week. She attributes the elevated readings to her dietary intake. She continues to recheck levels with her standard glucometer for readings outside of the established range.  . Reviewed s/sx of hypoglycemia and hyperglycemia. Denies symptoms over the past few weeks. She is very knowledgeable of the appropriate interventions. Her A1C has increased and is currently not at goal. She attributes this  to her recent surgery. She is confident that her A1C will decrease once she returns to her established routine.    Patient Goals/Self-Care Activities - Self administer oral medications and insulin as prescribed - Attend all scheduled provider appointments - Assess readings on her Dexcom monitoring device and reevaluate with her standard glucometer as needed - Adhere to prescribed ADA/carb modified - Notify provider or care management team with questions and new concerns as needed   Follow Up Plan:  Will follow up next month   Patient Care Plan: Hypertension (Adult)    Problem Identified: Hypertension (Hypertension)     Long-Range Goal: Hypertension Monitored   Start Date: 02/21/2021  Expected End Date: 06/21/2021  Priority: High  Note:   Objective:  . Last practice recorded BP readings:  BP Readings from Last 3 Encounters:  03/19/21 (!) 125/56  01/28/21 (!) 149/71  10/26/20 132/61    . Most recent eGFR/CrCl: No results found for: EGFR  No components found for: CRCL  Lab Results  Component Value Date   CHOL 220 (H)  01/28/2021   HDL 57 01/28/2021   LDLCALC 142 (H) 01/28/2021   TRIG 116 01/28/2021   CHOLHDL 3.4 01/20/2020      Current Barriers:  . Chronic Disease Management support and educational needs r/t Hypertension and Hyperlipidemia.  Case Manager Clinical Goal(s):  [name] Over the next 120 days, patient will demonstrate improved adherence to prescribed treatment plan as evidenced by taking all medications as prescribed, monitoring and recording blood pressure and adhering to a cardiac prudent/diabetic diet.   Interventions:  . Collaboration with [name], MD regarding development and update of comprehensive plan of care as evidenced by provider attestation and co-signature . Inter-disciplinary care team collaboration (see longitudinal plan of care) . Reviewed medications. Encouraged to continue taking as prescribed and notify provider if unable to tolerate  prescribed regimen.  . Provided information regarding established blood pressure parameters along with indications for notifying a provider. Currently not monitoring due to not being able to locate her BP monitor. Encouraged to monitor and record readings. . Discussed complications of uncontrolled BP and cholesterol. Reviewed s/sx of heart attack, stroke and worsening symptoms that require immediate medical attention.   Patient Goals/Self-Care Activities: -Self-administer medications as prescribed -Attend all scheduled provider appointments -Monitor and record blood pressure -Adhere to recommended cardiac prudent/heart healthy diet -Notify provider or care management team with questions and new concerns as needed   Follow Up Plan:  Will follow up next month          Patient Care Plan: Fall Risk (Adult)    Problem Identified: Fall Risk     Goal: Absence of Fall and Fall-Related Injury   Start Date: 03/29/2021  Expected End Date: 04/28/2021  Priority: High  Note:   Current Barriers:  . Risk for Falls d/t Impaired Gait r/t Lumbar Fusion  Clinical Goal(s):  [name] Over the next 30 days, patient will not experience falls and maintain an optimal level of function.   Interventions:  . Collaboration with [name], MD regarding development and update of comprehensive plan of care as evidenced by provider attestation and co-signature . Inter-disciplinary care team collaboration (see longitudinal plan of care) . S/P Lumbar Fusion. Discussed current treatment plan and activity restrictions. Reviewed potential medication side effects such as dizziness, lightheadedness and frequent urination. Reports recovering well. She is following the surgical team's instruction to avoid lifting objects greater than 5 lbs. Reports wearing her back brace as recommended. Denies pain, edema, or drainage to the incision site. She will follow up with the surgical team on 04/19/21. [name] Provided information  regarding safety and fall prevention. Encouraged to continue using her rollator walker as needed. Advised to avoid prolonged activity and continue adhering to the recommended lifting/activity restrictions. She continues to complete ADL's and self-care. Family and friends are readily available as needed for other tasks. Declines need for additional in-home assistance.   Self-Care Deficits/Patient Goals:  -Utilize assistive device appropriately with all ambulation -Ensure pathways are clear and well lit -Change positions slowly  -Follow recommended lifting and activity restrictions -Wear secure fitting, skid free footwear when ambulating -Complete surgical follow up as scheduled  -Notify provider or care management team for questions and new concerns as needed   Follow Up Plan:  Will follow up next month     PLAN A member of the care management team will follow up with Ms. Bastyr next month.   Cristy [facility]lander [facility] Care Management [facility] [phone]

## 2021-04-01 NOTE — Patient Instructions (Addendum)
Thank you for allowing the Chronic Care Management team to participate in your care. It was a pleasure speaking with you today. Please feel free to contact me with questions.   Goals Addressed: Patient Care Plan: Diabetes Type 1 (Adult)    Problem Identified: Glycemic Management (Diabetes, Type 1)     Long-Range Goal: Glycemic Management Optimized   Start Date: 02/21/2021  Expected End Date: 06/21/2021  Priority: High  Note:   Objective:   Lab Results  Component Value Date   HGBA1C 7.9 (H) 03/18/2021    Lab Results  Component Value Date   HGBA1C 6.8 (A) 01/28/2021   Lab Results  Component Value Date   CREATININE 0.77 03/19/2021    Lab Results  Component Value Date   CREATININE 0.75 01/28/2021   CREATININE 0.74 01/20/2020   CREATININE 0.76 12/06/2018   . No results found for: EGFR Current Barriers:  . Chronic Disease Management support and educational needs r/t Diabetes.  Case Manager Clinical Goal(s):  [name] Over the next 120 days, patient not require emergent care d/t complications r/t hypoglycemia. . Over the next 120 days, patient will demonstrate adherence to prescribed treatment plan for Diabetes self management as evidenced by consistently assessing readings on her continuous glucose monitoring device, taking medications as prescribed and adhering to the recommended ADA/carb modified diet.   Interventions:  . Collaboration with [name], MD  regarding development and update of comprehensive plan of care as evidenced by provider attestation and co-signature . Inter-disciplinary care team collaboration (see longitudinal plan of care) . Reviewed compliance with current treatment plan. Reports excellent compliance with medications and insulin administration.  She was previously using a [employer]. The CCM Pharmacist assisted her with transitioning to a Dexcom G6 monitoring device. Reports a fasting range of 95 to 179 mg/dl over the past week. She attributes the  elevated readings to her dietary intake. She continues to recheck levels with her standard glucometer for readings outside of the established range.  . Reviewed s/sx of hypoglycemia and hyperglycemia. Denies symptoms over the past few weeks. She is very knowledgeable of the appropriate interventions. Her A1C has increased and is currently not at goal. She attributes this to her recent surgery. She is confident that her A1C will decrease once she returns to her established routine.    Patient Goals/Self-Care Activities - Self administer oral medications and insulin as prescribed - Attend all scheduled provider appointments - Assess readings on her Dexcom monitoring device and reevaluate with her standard glucometer as needed - Adhere to prescribed ADA/carb modified - Notify provider or care management team with questions and new concerns as needed   Follow Up Plan:  Will follow up next month   Patient Care Plan: Hypertension (Adult)    Problem Identified: Hypertension (Hypertension)     Long-Range Goal: Hypertension Monitored   Start Date: 02/21/2021  Expected End Date: 06/21/2021  Priority: High  Note:   Objective:  . Last practice recorded BP readings:  BP Readings from Last 3 Encounters:  03/19/21 (!) 125/56  01/28/21 (!) 149/71  10/26/20 132/61    . Most recent eGFR/CrCl: No results found for: EGFR  No components found for: CRCL  Lab Results  Component Value Date   CHOL 220 (H) 01/28/2021   HDL 57 01/28/2021   LDLCALC 142 (H) 01/28/2021   TRIG 116 01/28/2021   CHOLHDL 3.4 01/20/2020      Current Barriers:  . Chronic Disease Management support and educational needs r/t Hypertension and Hyperlipidemia.  Case Manager Clinical Goal(s):  [name] Over the next 120 days, patient will demonstrate improved adherence to prescribed treatment plan as evidenced by taking all medications as prescribed, monitoring and recording blood pressure and adhering to a cardiac prudent/diabetic diet.    Interventions:  . Collaboration with [name], MD  regarding development and update of comprehensive plan of care as evidenced by provider attestation and co-signature . Inter-disciplinary care team collaboration (see longitudinal plan of care) . Reviewed medications. Encouraged to continue taking as prescribed and notify provider if unable to tolerate prescribed regimen.  . Provided information regarding established blood pressure parameters along with indications for notifying a provider. Currently not monitoring due to not being able to locate her BP monitor. Encouraged to monitor and record readings. . Discussed complications of uncontrolled BP and cholesterol. Reviewed s/sx of heart attack, stroke and worsening symptoms that require immediate medical attention.   Patient Goals/Self-Care Activities: -Self-administer medications as prescribed -Attend all scheduled provider appointments -Monitor and record blood pressure -Adhere to recommended cardiac prudent/heart healthy diet -Notify provider or care management team with questions and new concerns as needed   Follow Up Plan:  Will follow up next month          Patient Care Plan: Fall Risk (Adult)    Problem Identified: Fall Risk     Goal: Absence of Fall and Fall-Related Injury   Start Date: 03/29/2021  Expected End Date: 04/28/2021  Priority: High  Note:   Current Barriers:  . Risk for Falls d/t Impaired Gait r/t Lumbar Fusion  Clinical Goal(s):  [name] Over the next 30 days, patient will not experience falls and maintain an optimal level of function.   Interventions:  . Collaboration with [name], MD regarding development and update of comprehensive plan of care as evidenced by provider attestation and co-signature . Inter-disciplinary care team collaboration (see longitudinal plan of care) . S/P Lumbar Fusion. Discussed current treatment plan and activity restrictions. Reviewed potential medication  side effects such as dizziness, lightheadedness and frequent urination. Reports recovering well. She is following the surgical team's instruction to avoid lifting objects greater than 5 lbs. Reports wearing her back brace as recommended. Denies pain, edema, or drainage to the incision site. She will follow up with the surgical team on 04/19/21. [name] Provided information regarding safety and fall prevention. Encouraged to continue using her rollator walker as needed. Advised to avoid prolonged activity and continue adhering to the recommended lifting/activity restrictions. She continues to complete ADL's and self-care. Family and friends are readily available as needed for other tasks. Declines need for additional in-home assistance.   Self-Care Deficits/Patient Goals:  -Utilize assistive device appropriately with all ambulation -Ensure pathways are clear and well lit -Change positions slowly  -Follow recommended lifting and activity restrictions -Wear secure fitting, skid free footwear when ambulating -Complete surgical follow up as scheduled  -Notify provider or care management team for questions and new concerns as needed   Follow Up Plan:  Will follow up next month           Karla Patton verbalized understanding of the information discussed during the telephonic outreach today. Declined need for mailed/printed resources. A member of the care management team will follow up with Karla Patton next month.   Karla Patton [facility] Care Management [facility] [phone]

## 2021-04-05 ENCOUNTER — Ambulatory Visit: Payer: Self-pay

## 2021-04-05 DIAGNOSIS — E103293 Type 1 diabetes mellitus with mild nonproliferative diabetic retinopathy without macular edema, bilateral: Secondary | ICD-10-CM

## 2021-04-05 DIAGNOSIS — I152 Hypertension secondary to endocrine disorders: Secondary | ICD-10-CM | POA: Diagnosis not present

## 2021-04-05 DIAGNOSIS — E1059 Type 1 diabetes mellitus with other circulatory complications: Secondary | ICD-10-CM | POA: Diagnosis not present

## 2021-04-05 NOTE — Chronic Care Management (AMB) (Signed)
  Chronic Care Management   Note  04/05/2021 Name: Karla Patton MRN: [id number] DOB: 1951-02-24   Notification received from [name] regarding reassignment of her primary care provider. Requested assistance with scheduling a clinic visit with her new provider, Dr. Brita Romp. Clinic visit scheduled for June 11, 2021. She agreed to contact the clinic if she requires an acute visit prior to her June appointment.   Follow up plan: Will follow up next month   [facility] Care Management [facility] [phone]

## 2021-04-09 ENCOUNTER — Telehealth: Payer: Self-pay | Admitting: Family Medicine

## 2021-04-09 NOTE — Telephone Encounter (Signed)
Pt says she does NOT want to go to [place]. she says she will find an endocrinologist if she decides that is what she wants to do. she says she has gotten the letters from [facility], and has not scheduled purposely. Pt said no reason to call her back about this referral to [facility].

## 2021-04-09 NOTE — Telephone Encounter (Signed)
FYI

## 2021-04-16 DIAGNOSIS — Z6830 Body mass index (BMI) 30.0-30.9, adult: Secondary | ICD-10-CM | POA: Diagnosis not present

## 2021-04-16 DIAGNOSIS — M4316 Spondylolisthesis, lumbar region: Secondary | ICD-10-CM | POA: Diagnosis not present

## 2021-04-22 ENCOUNTER — Other Ambulatory Visit: Payer: Self-pay | Admitting: Obstetrics and Gynecology

## 2021-04-22 DIAGNOSIS — Z1231 Encounter for screening mammogram for malignant neoplasm of breast: Secondary | ICD-10-CM

## 2021-04-30 ENCOUNTER — Ambulatory Visit: Payer: Medicare Other

## 2021-05-01 ENCOUNTER — Ambulatory Visit: Payer: Self-pay | Admitting: Physician Assistant

## 2021-05-03 ENCOUNTER — Ambulatory Visit: Payer: Self-pay | Admitting: Family Medicine

## 2021-05-17 ENCOUNTER — Telehealth: Payer: Self-pay

## 2021-05-20 ENCOUNTER — Other Ambulatory Visit: Payer: Self-pay | Admitting: Physician Assistant

## 2021-05-20 DIAGNOSIS — K219 Gastro-esophageal reflux disease without esophagitis: Secondary | ICD-10-CM

## 2021-05-21 ENCOUNTER — Ambulatory Visit (INDEPENDENT_AMBULATORY_CARE_PROVIDER_SITE_OTHER): Payer: Medicare Other

## 2021-05-21 DIAGNOSIS — Z9181 History of falling: Secondary | ICD-10-CM

## 2021-05-21 DIAGNOSIS — E103293 Type 1 diabetes mellitus with mild nonproliferative diabetic retinopathy without macular edema, bilateral: Secondary | ICD-10-CM

## 2021-05-21 NOTE — Chronic Care Management (AMB) (Signed)
Chronic Care Management   Follow Up Note   05/21/2021 Name: Karla Patton MRN: [id number] DOB: 03/03/1951  Primary Care Provider: Virginia Crews, MD Reason for referral : Chronic Care Management   Karla Patton is a 70 y.o. year old female who is a primary care patient of Virginia Crews, MD. She is currently enrolled in the Chronic Care Management program. A routine telephonic outreach was conducted today.  Review of [name] status, including review of consultants reports, relevant labs and test results was conducted today. Collaboration with appropriate care team members was performed as part of the comprehensive evaluation and provision of chronic care management services.    SDOH (Social Determinants of Health) assessments performed: No     Outpatient Encounter Medications as of 05/21/2021  Medication Sig Note  . acetaminophen (TYLENOL) 500 MG tablet Take 1,000 mg by mouth daily as needed.   . ALPRAZolam (XANAX) 0.5 MG tablet TAKE 1 TABLET(0.5 MG) BY MOUTH TWICE DAILY (Patient taking differently: Take 0.5 mg by mouth at bedtime.) 03/29/2021: Taking as prescribed  . APPLE CIDER VINEGAR PO Take 600 mg by mouth daily.    [name] aspirin EC 81 MG tablet Take 81 mg by mouth daily. Takes at bedtime   . beta carotene 25000 UNIT capsule Take 25,000 Units by mouth daily.   . Cholecalciferol (VITAMIN D3) 250 MCG (10000 UT) capsule Take 10,000 Units by mouth daily.   . Coenzyme Q10 (COQ10) 100 MG CAPS Take 100 mg by mouth daily. With cinnamon   . Continuous Blood Gluc Sensor (FREESTYLE LIBRE 14 DAY SENSOR) MISC To check blood sugar ACHS, remove and replace every 14 days   . CONTOUR NEXT TEST test strip CHECK BLOOD SUGAR BEFORE MEALS AND AT BEDTIME   . cyclobenzaprine (FLEXERIL) 10 MG tablet Take 1 tablet (10 mg total) by mouth 3 (three) times daily as needed for muscle spasms.   [name] docusate sodium (COLACE) 100 MG capsule Take 1 capsule (100 mg total) by mouth 2 (two) times daily.   .  Ferrous Sulfate Dried (HIGH POTENCY IRON) 65 MG TABS Take 1 tablet by mouth daily.   . furosemide (LASIX) 20 MG tablet TAKE 1 TABLET(20 MG) BY MOUTH DAILY (Patient taking differently: Take 20 mg by mouth daily. TAKE 1 TABLET(20 MG) BY MOUTH DAILY)   . insulin aspart (NOVOLOG FLEXPEN) 100 UNIT/ML FlexPen Inject 5-10 Units into the skin 3 (three) times daily with meals. Per sliding scale   . insulin glargine (LANTUS SOLOSTAR) 100 UNIT/ML Solostar Pen Inject 10 Units into the skin 2 (two) times daily.   . Insulin Pen Needle (B-D ULTRAFINE III SHORT PEN) 31G X 8 MM MISC USE AS DIRECTED FOUR TIMES DAILY, BEFORE MEALS AND AT BEDTIME   . levothyroxine (SYNTHROID) 100 MCG tablet TAKE 1 TABLET BY MOUTH DAILY BEFORE BREAKFAST (Patient taking differently: Take 100 mcg by mouth daily before breakfast. TAKE 1 TABLET BY MOUTH DAILY BEFORE BREAKFAST)   . MAGNESIUM-ZINC PO Take 1 tablet by mouth daily.   . Multiple Vitamins-Minerals (ABC PLUS SENIOR ADULTS 50+ PO) Take 1 tablet by mouth daily. 02/26/2021: Centrum Silver   . omeprazole (PRILOSEC) 40 MG capsule Take 1 capsule (40 mg total) by mouth daily.   [name] oxyCODONE (OXY IR/ROXICODONE) 5 MG immediate release tablet Take 1 tablet (5 mg total) by mouth every 4 (four) hours as needed for moderate pain ((score 4 to 6)).   [name] polyethylene glycol (MIRALAX / GLYCOLAX) 17 g packet Take 17 g  by mouth daily as needed.   . potassium chloride SA (KLOR-CON) 20 MEQ tablet TAKE 1 TABLET BY MOUTH EVERY DAY. TAKE WITH FUROSEMIDE WHEN NEEDED AS DIRECTED (Patient taking differently: Take 20 mEq by mouth daily. TAKE 1 TABLET BY MOUTH EVERY DAY. TAKE WITH FUROSEMIDE WHEN NEEDED AS DIRECTED)   . pregabalin (LYRICA) 225 MG capsule TAKE 1 CAPSULE(225 MG) BY MOUTH DAILY (Patient taking differently: Take 225 mg by mouth daily.) 03/15/2021: Takes at bedtime   No facility-administered encounter medications on file as of 05/21/2021.        Objective:  Patient Care Plan: Diabetes Type 1 (Adult)   Problem Identified: Glycemic Management (Diabetes, Type 1)     Long-Range Goal: Glycemic Management Optimized   Start Date: 02/21/2021  Expected End Date: 06/21/2021  Priority: High  Note:   Objective:   Lab Results  Component Value Date   HGBA1C 7.9 (H) 03/18/2021     Lab Results  Component Value Date   CREATININE 0.77 03/19/2021     Current Barriers:  . Chronic Disease Management support and educational needs r/t Diabetes.  Case Manager Clinical Goal(s):  [name] Over the next 120 days, patient not require emergent care d/t complications r/t hypoglycemia. . Over the next 120 days, patient will demonstrate adherence to prescribed treatment plan for Diabetes self management as evidenced by consistently assessing readings on her continuous glucose monitoring device, taking medications as prescribed and adhering to the recommended ADA/carb modified diet.   Interventions:  . Collaboration with [name], MD, regarding development and update of comprehensive plan of care as evidenced by provider attestation and co-signature . Inter-disciplinary care team collaboration (see longitudinal plan of care) . Reviewed compliance with current treatment plan. Reports excellent compliance with medications and insulin administration.  Reports fasting readings have ranged in the 120's and 130's. Reviewed s/sx of hypoglycemia and hyperglycemia. Denies symptoms since the last outreach. Reports doing very well with her nutritional intake. Her ability to exercise remains limited d/t activity restrictions r/t recent lumbar fusion. Overall feels that she is doing very well with diabetes self-management.    Patient Goals/Self-Care Activities -Self administer oral medications and insulin as prescribed -Attend all scheduled provider appointments -Adhere to prescribed ADA/carb modified -Notify provider or care management team with questions and new concerns as needed   Follow Up Plan:  Will follow up  next month      Patient Care Plan: Hypertension (Adult)  Problem Identified: Hypertension (Hypertension)     Long-Range Goal: Hypertension Monitored Completed 05/21/2021  Start Date: 02/21/2021  Expected End Date: 06/21/2021  Priority: High  Note:     Current Barriers:  . Chronic Disease Management support and educational needs r/t Hypertension and Hyperlipidemia.  Case Manager Clinical Goal(s):  [name] Over the next 120 days, patient will demonstrate improved adherence to prescribed treatment plan as evidenced by taking all medications as prescribed, monitoring and recording blood pressure and adhering to a cardiac prudent/diabetic diet.   Interventions:  . Collaboration with [name], MD, regarding development and update of comprehensive plan of care as evidenced by provider attestation and co-signature . Inter-disciplinary care team collaboration (see longitudinal plan of care) . Reviewed medications and compliance with treatment recommendations. Reports excellent compliance with medications. Tolerating prescribed regimen and attempting to adhere to the recommended diet. Reviewed established BP parameters and indications for notifying a provider. Reports monitoring BP as advised. Reports readings have been very good and within range. Her activity/ability to exercise remains limited d/t restrictions r/t recent  lumbar fusion.  . Reviewed s/sx of heart attack, stroke and worsening symptoms that require immediate medical attention.   Patient Goals/Self-Care Activities: -Self-administer medications as prescribed -Attend all scheduled provider appointments -Complete labs as indicated -Monitor and record blood pressure -Adhere to recommended cardiac prudent/heart healthy diet -Notify provider or care management team with questions and new concerns as needed   Goal Met         Patient Care Plan: Fall Risk (Adult)  Problem Identified: Fall Risk     Long-Range Goal: Absence of  Fall and Fall-Related Injury   Start Date: 05/21/2021  Expected End Date: 08/19/2021  Priority: High  Note:   Current Barriers:  . Risk for Falls d/t Impaired Gait r/t Lumbar Fusion  Clinical Goal(s):  [name] Over the next 90 days, patient will not experience falls and maintain an optimal level of function.   Interventions:  . Collaboration with [name], MD regarding development and update of comprehensive plan of care as evidenced by provider attestation and co-signature . Inter-disciplinary care team collaboration (see longitudinal plan of care) . S/P Lumbar Fusion. Reviewed current treatment plan and activity restrictions. Continues to follow the recommended activity and weight restrictions, however she experienced a fall on yesterday. Reports being at an outdoor gathering on yesterday and attempting to ambulate down the steps while holding items. Accidentally missed the bottom step resulting in a fall. Denies open wounds or visible injuries. Reports standing and ambulating without difficulty following the fall. She reports discomfort and soreness to her buttocks and hips.  Discussed concerns r/t recent lumbar fusion. Pending a return call from the Neurosurgical team regarding recommendation for follow-up and imaging. . Reviewed measures safety and fall prevention. She continues to complete ADL's and self-care independently. Family and friends remain available to assist as needed.   Self-Care Deficits/Patient Goals:  -Utilize assistive device appropriately with all ambulation -Ensure pathways are clear and well lit -Change positions slowly  -Follow recommended lifting and activity restrictions -Wear secure fitting, skid free footwear when ambulating -Follow up with the Neurosurgical team for imaging as indicated -Notify provider or care management team for questions and new concerns as needed   Follow Up Plan:  Will follow up within the next month        PLAN A member of  the care management team will follow-up with Ms. Hatfield within the next month.    [facility] Care Management [facility] [phone]

## 2021-05-23 DIAGNOSIS — Z981 Arthrodesis status: Secondary | ICD-10-CM | POA: Diagnosis not present

## 2021-05-27 NOTE — Patient Instructions (Signed)
Thank you for allowing the Chronic Care Management team to participate in your care. It was a pleasure speaking with you. Please feel free to contact me with questions.    Goals Addressed: Patient Care Plan: Diabetes Type 1 (Adult)  Problem Identified: Glycemic Management (Diabetes, Type 1)     Long-Range Goal: Glycemic Management Optimized   Start Date: 02/21/2021  Expected End Date: 06/21/2021  Priority: High  Note:   Objective:   Lab Results  Component Value Date   HGBA1C 7.9 (H) 03/18/2021     Lab Results  Component Value Date   CREATININE 0.77 03/19/2021     Current Barriers:  . Chronic Disease Management support and educational needs r/t Diabetes.  Case Manager Clinical Goal(s):  [name] Over the next 120 days, patient not require emergent care d/t complications r/t hypoglycemia. . Over the next 120 days, patient will demonstrate adherence to prescribed treatment plan for Diabetes self management as evidenced by consistently assessing readings on her continuous glucose monitoring device, taking medications as prescribed and adhering to the recommended ADA/carb modified diet.   Interventions:  . Collaboration with [name], MD, regarding development and update of comprehensive plan of care as evidenced by provider attestation and co-signature . Inter-disciplinary care team collaboration (see longitudinal plan of care) . Reviewed compliance with current treatment plan. Reports excellent compliance with medications and insulin administration.  Reports fasting readings have ranged in the 120's and 130's. Reviewed s/sx of hypoglycemia and hyperglycemia. Denies symptoms since the last outreach. Reports doing very well with her nutritional intake. Her ability to exercise remains limited d/t activity restrictions r/t recent lumbar fusion. Overall feels that she is doing very well with diabetes self-management.    Patient Goals/Self-Care Activities -Self administer oral  medications and insulin as prescribed -Attend all scheduled provider appointments -Adhere to prescribed ADA/carb modified -Notify provider or care management team with questions and new concerns as needed   Follow Up Plan:  Will follow up next month      Patient Care Plan: Hypertension (Adult)  Problem Identified: Hypertension (Hypertension)     Long-Range Goal: Hypertension Monitored Completed 05/21/2021  Start Date: 02/21/2021  Expected End Date: 06/21/2021  Priority: High  Note:     Current Barriers:  . Chronic Disease Management support and educational needs r/t Hypertension and Hyperlipidemia.  Case Manager Clinical Goal(s):  [name] Over the next 120 days, patient will demonstrate improved adherence to prescribed treatment plan as evidenced by taking all medications as prescribed, monitoring and recording blood pressure and adhering to a cardiac prudent/diabetic diet.   Interventions:  . Collaboration with [name], MD, regarding development and update of comprehensive plan of care as evidenced by provider attestation and co-signature . Inter-disciplinary care team collaboration (see longitudinal plan of care) . Reviewed medications and compliance with treatment recommendations. Reports excellent compliance with medications. Tolerating prescribed regimen and attempting to adhere to the recommended diet. Reviewed established BP parameters and indications for notifying a provider. Reports monitoring BP as advised. Reports readings have been very good and within range. Her activity/ability to exercise remains limited d/t restrictions r/t recent lumbar fusion.  . Reviewed s/sx of heart attack, stroke and worsening symptoms that require immediate medical attention.   Patient Goals/Self-Care Activities: -Self-administer medications as prescribed -Attend all scheduled provider appointments -Complete labs as indicated -Monitor and record blood pressure -Adhere to recommended  cardiac prudent/heart healthy diet -Notify provider or care management team with questions and new concerns as needed   Goal Met  Patient Care Plan: Fall Risk (Adult)  Problem Identified: Fall Risk     Long-Range Goal: Absence of Fall and Fall-Related Injury   Start Date: 05/21/2021  Expected End Date: 08/19/2021  Priority: High  Note:   Current Barriers:  . Risk for Falls d/t Impaired Gait r/t Lumbar Fusion  Clinical Goal(s):  [name] Over the next 90 days, patient will not experience falls and maintain an optimal level of function.   Interventions:  . Collaboration with [name], MD regarding development and update of comprehensive plan of care as evidenced by provider attestation and co-signature . Inter-disciplinary care team collaboration (see longitudinal plan of care) . S/P Lumbar Fusion. Reviewed current treatment plan and activity restrictions. Continues to follow the recommended activity and weight restrictions, however she experienced a fall on yesterday. Reports being at an outdoor gathering on yesterday and attempting to ambulate down the steps while holding items. Accidentally missed the bottom step resulting in a fall. Denies open wounds or visible injuries. Reports standing and ambulating without difficulty following the fall. She reports discomfort and soreness to her buttocks and hips.  Discussed concerns r/t recent lumbar fusion. Pending a return call from the Neurosurgical team regarding recommendation for follow-up and imaging. . Reviewed measures safety and fall prevention. She continues to complete ADL's and self-care independently. Family and friends remain available to assist as needed.   Self-Care Deficits/Patient Goals:  -Utilize assistive device appropriately with all ambulation -Ensure pathways are clear and well lit -Change positions slowly  -Follow recommended lifting and activity restrictions -Wear secure fitting, skid free footwear when  ambulating -Follow up with the Neurosurgical team for imaging as indicated -Notify provider or care management team for questions and new concerns as needed   Follow Up Plan:  Will follow up within the next month         Ms. Knock verbalized understanding of the information discussed during the telephonic outreach today. Declined need for mailed/printed instructions. A member of the care management team will follow-up within the next month.    [facility] [phone]

## 2021-05-30 ENCOUNTER — Telehealth: Payer: Self-pay

## 2021-05-30 NOTE — Progress Notes (Signed)
Spoke to patient to confirmed patient telephone appointment on 05/31/2021 for CCM at 10:00 am with [name] the Clinical pharmacist.    Patient verbalized understanding.  Questions: Have you had any recent office visit or specialist visit outside of [place]?No Are there any concerns you would like to discuss during your office visit? NO Are you having any problems obtaining your medications? NO  Star Rating Drug: None  Any gaps in medications fill history? No  Anderson Malta Clinical Production designer, theatre/television/film [phone]

## 2021-05-31 ENCOUNTER — Ambulatory Visit (INDEPENDENT_AMBULATORY_CARE_PROVIDER_SITE_OTHER): Payer: Medicare Other

## 2021-05-31 DIAGNOSIS — E103293 Type 1 diabetes mellitus with mild nonproliferative diabetic retinopathy without macular edema, bilateral: Secondary | ICD-10-CM

## 2021-05-31 DIAGNOSIS — I152 Hypertension secondary to endocrine disorders: Secondary | ICD-10-CM | POA: Diagnosis not present

## 2021-05-31 DIAGNOSIS — E1159 Type 2 diabetes mellitus with other circulatory complications: Secondary | ICD-10-CM

## 2021-05-31 NOTE — Progress Notes (Signed)
Chronic Care Management Pharmacy Note  06/06/2021 Name:  Karla Patton MRN:  [id number] DOB:  02-10-1951   Summary: Patient has been using the Dexcom G6 sensors and has felt this has significantly helped her manage her diabetes. She has has a few instances of hypoglycemia, but sensor report indicates overall hypoglycemia time is at goal  Recommendations/Changes made from today's visit: Continue Current Medications  Plan: CPP 3 month follow-up  Subjective: Karla Patton is an 70 y.o. year old female who is a primary patient of Bacigalupo, Dionne Bucy, MD.  The CCM team was consulted for assistance with disease management and care coordination needs.    Engaged with patient by telephone for follow up visit in response to provider referral for pharmacy case management and/or care coordination services.   Consent to Services:  The patient was given information about Chronic Care Management services, agreed to services, and gave verbal consent prior to initiation of services.  Please see initial visit note for detailed documentation.   Patient Care Team: Virginia Crews, MD as PCP - General (Family Medicine) Dasher, Rayvon Char, MD (Dermatology) Kem Parkinson, MD (Ophthalmology) Bary Castilla Forest Gleason, MD as Consulting Physician (General Surgery) Anabel Bene, MD as Referring Physician (Neurology) Germaine Pomfret, [facility] (Pharmacist) Neldon Labella, RN as Case Manager  Recent office visits: 01/28/2021: Patient presented to [name], PA-C for follow-up. A1c 6.8%. LDL 142. Augmentin, CoQ10, melatonin stopped.   Recent consult visits: None in previous 6 months  Hospital visits: 03/18/21: Patient hospitalized for elective Lumbar surgery   Objective:  Lab Results  Component Value Date   CREATININE 0.77 03/19/2021   BUN 13 03/19/2021   GFR 91.51 03/01/2015   GFRNONAA >60 03/19/2021   GFRAA 95 01/28/2021   NA 129 (L) 03/19/2021   K 3.7 03/19/2021   CALCIUM 8.4  (L) 03/19/2021   CO2 27 03/19/2021    Lab Results  Component Value Date/Time   HGBA1C 7.9 (H) 03/18/2021 09:37 AM   HGBA1C 6.8 (A) 01/28/2021 10:43 AM   HGBA1C 7.7 (A) 10/26/2020 11:26 AM   HGBA1C 7.3 (H) 01/20/2020 11:31 AM   GFR 91.51 03/01/2015 09:55 AM   GFR 90.01 02/26/2015 02:20 PM   MICROALBUR negative 07/23/2020 10:03 AM   MICROALBUR neg 03/07/2019 11:58 AM    Last diabetic Eye exam:  Lab Results  Component Value Date/Time   HMDIABEYEEXA No Retinopathy 08/20/2020 12:00 AM    Last diabetic Foot exam:  Lab Results  Component Value Date/Time   HMDIABFOOTEX normal 10/10/2014 12:00 AM     Lab Results  Component Value Date   CHOL 220 (H) 01/28/2021   HDL 57 01/28/2021   LDLCALC 142 (H) 01/28/2021   TRIG 116 01/28/2021   CHOLHDL 3.4 01/20/2020    Hepatic Function Latest Ref Rng & Units 01/28/2021 01/20/2020 05/31/2018  Total Protein 6.0 - 8.5 g/dL 7.2 7.5 7.0  Albumin 3.8 - 4.8 g/dL 4.6 4.6 4.5  AST 0 - 40 IU/L _0 ALT 0 - 32 IU/L _1 Alk Phosphatase 44 - 121 IU/L 101 124(H) 98  Total Bilirubin 0.0 - 1.2 mg/dL 0.3 0.3 0.5    Lab Results  Component Value Date/Time   TSH 2.380 01/28/2021 11:29 AM   TSH 2.050 01/20/2020 11:31 AM    CBC Latest Ref Rng & Units 03/19/2021 03/18/2021 03/18/2021  WBC 4.0 - 10.5 K/uL 9.8 - 5.7  Hemoglobin 12.0 - 15.0 g/dL 9.3(L) 10.9(L) 12.9  Hematocrit 36.0 -  46.0 % 28.1(L) 32.0(L) 38.6  Platelets 150 - 400 K/uL 327 - 443(H)    Lab Results  Component Value Date/Time   VD25OH 29.3 (L) 12/06/2018 09:50 AM   VD25OH 37.7 05/29/2017 10:46 AM    Clinical ASCVD: No  The 10-year ASCVD risk score Mikey Bussing DC Jr., et al., 2013) is: 18.5%   Values used to calculate the score:     Age: 28 years     Sex: Female     Is Non-Hispanic African American: No     Diabetic: Yes     Tobacco smoker: No     Systolic Blood Pressure: 798 mmHg     Is BP treated: Yes     HDL Cholesterol: 57 mg/dL     Total Cholesterol: 220 mg/dL     Depression screen [facility] 2/9 02/21/2021 01/28/2021 10/19/2020  Decreased Interest 0 0 0  Down, Depressed, Hopeless 0 0 0  PHQ - 2 Score 0 0 0  Altered sleeping - 1 1  Tired, decreased energy - 0 1  Change in appetite - 0 1  Feeling bad or failure about yourself  - 0 0  Trouble concentrating - 0 0  Moving slowly or fidgety/restless - 0 0  Suicidal thoughts - 0 0  PHQ-9 Score - 1 3  Difficult doing work/chores - Not difficult at all Not difficult at all      Social History   Tobacco Use  Smoking Status Never  Smokeless Tobacco Never   BP Readings from Last 3 Encounters:  03/19/21 (!) 125/56  01/28/21 (!) 149/71  10/26/20 132/61   Pulse Readings from Last 3 Encounters:  03/19/21 89  01/28/21 82  10/26/20 74   Wt Readings from Last 3 Encounters:  03/18/21 194 lb (88 kg)  01/28/21 194 lb 6.4 oz (88.2 kg)  10/26/20 196 lb (88.9 kg)   Last DEXA Scan: 01/13/18   T-Score femoral neck: -2.0  T-Score total hip: NA  T-Score lumbar spine: -1.5  T-Score forearm radius: NA  10-year probability of major osteoporotic fracture: 10.3%  10-year probability of hip fracture: 1.5%   Assessment/Interventions: Review of patient past medical history, allergies, medications, health status, including review of consultants reports, laboratory and other test data, was performed as part of comprehensive evaluation and provision of chronic care management services.   SDOH:  (Social Determinants of Health) assessments and interventions performed: Yes SDOH Interventions    Flowsheet Row Most Recent Value  SDOH Interventions   Financial Strain Interventions Intervention Not Indicated        CCM Care Plan  No Known Allergies  Medications Reviewed Today     Reviewed by Neldon Labella, RN (Registered Nurse) on 05/21/21 at [place] List Status: <None>   Medication Order Taking? Sig Documenting Provider Last Dose Status Informant  acetaminophen (TYLENOL) 500 MG tablet [id number] No Take  1,000 mg by mouth daily as needed. [provider] 03/17/2021 Unknown time Active Self  ALPRAZolam (XANAX) 0.5 MG tablet [id number] No TAKE 1 TABLET(0.5 MG) BY MOUTH TWICE DAILY  Patient taking differently: Take 0.5 mg by mouth at bedtime.   Mar Daring, PA-C Taking Active            Med Note Minerva Ends, FELECIA N   Fri Mar 29, 2021  1:19 PM) Taking as prescribed  APPLE CIDER VINEGAR PO [id number] No Take 600 mg by mouth daily.  [provider] Past Week Unknown time Active Self  aspirin EC 81 MG tablet [id number]  No Take 81 mg by mouth daily. Takes at bedtime [provider] 03/14/2021 Active Self  beta carotene 25000 UNIT capsule [id number] No Take 25,000 Units by mouth daily. [provider] Past Month Unknown time Active Self  Cholecalciferol (VITAMIN D3) 250 MCG (10000 UT) capsule [id number] No Take 10,000 Units by mouth daily. [provider] Past Month Unknown time Active Self  Coenzyme Q10 (COQ10) 100 MG CAPS [id number] No Take 100 mg by mouth daily. With cinnamon [provider] Past Month Unknown time Active Self  Continuous Blood Gluc Sensor (FREESTYLE LIBRE [place]) MISC [id number] No To check blood sugar ACHS, remove and replace every 14 days Rubye Beach Taking Active Self  CONTOUR NEXT TEST test strip [id number] No CHECK BLOOD SUGAR BEFORE MEALS AND AT BEDTIME Mar Daring, PA-C Taking Active Self  cyclobenzaprine (FLEXERIL) 10 MG tablet [id number]  Take 1 tablet (10 mg total) by mouth 3 (three) times daily as needed for muscle spasms. Viona Gilmore D, NP  Active   docusate sodium (COLACE) 100 MG capsule [id number]  Take 1 capsule (100 mg total) by mouth 2 (two) times daily. Viona Gilmore D, NP  Active   Ferrous Sulfate Dried (HIGH POTENCY IRON) 65 MG TABS [id number] No Take 1 tablet by mouth daily. [provider] 03/15/2021 Active Self  furosemide (LASIX) 20 MG tablet [id number] No TAKE 1 TABLET(20  MG) BY MOUTH DAILY  Patient taking differently: Take 20 mg by mouth daily. TAKE 1 TABLET(20 MG) BY MOUTH DAILY   Mar Daring, PA-C 03/16/2021 Active   insulin aspart (NOVOLOG FLEXPEN) 100 UNIT/ML FlexPen [id number] No Inject 5-10 Units into the skin 3 (three) times daily with meals. Per sliding scale [provider] 03/17/2021 2030 Active Self  insulin glargine (LANTUS SOLOSTAR) 100 UNIT/ML Solostar Pen [id number] No Inject 10 Units into the skin 2 (two) times daily. Mar Daring, [place] 03/17/2021 2200 Active   Insulin Pen Needle (B-D ULTRAFINE III SHORT PEN) 31G X 8 MM MISC [id number] No USE AS DIRECTED FOUR TIMES DAILY, BEFORE MEALS AND AT BEDTIME [name] M, PA-C Taking Active Self  levothyroxine (SYNTHROID) 100 MCG tablet [id number] No TAKE 1 TABLET BY MOUTH DAILY BEFORE BREAKFAST  Patient taking differently: Take 100 mcg by mouth daily before breakfast. TAKE 1 TABLET BY MOUTH DAILY BEFORE BREAKFAST   Mar Daring, PA-C 03/18/2021 0615 Active   MAGNESIUM-ZINC PO [id number] No Take 1 tablet by mouth daily. [provider] Past Week Unknown time Active Self  Multiple Vitamins-Minerals (ABC PLUS SENIOR ADULTS 50+ PO) [id number] No Take 1 tablet by mouth daily. [provider] Past Week Unknown time Active Self           Med Note ([place]   Tue Feb 26, 2021  1:15 PM) Centrum Silver   omeprazole (PRILOSEC) 40 MG capsule [id number] No Take 1 capsule (40 mg total) by mouth daily. Mar Daring, [place] 03/18/2021 0615 Active Self  oxyCODONE (OXY IR/ROXICODONE) 5 MG immediate release tablet [id number]  Take 1 tablet (5 mg total) by mouth every 4 (four) hours as needed for moderate pain ((score 4 to 6)). Viona Gilmore D, NP  Active   polyethylene glycol (MIRALAX / GLYCOLAX) 17 g packet [id number] No Take 17 g by mouth daily as needed. [provider] Past Week Unknown time Active   potassium chloride SA (KLOR-CON) 20 MEQ tablet  [id number] No TAKE 1 TABLET BY MOUTH EVERY DAY. TAKE WITH FUROSEMIDE WHEN NEEDED  AS DIRECTED  Patient taking differently: Take 20 mEq by mouth daily. TAKE 1 TABLET BY MOUTH EVERY DAY. TAKE WITH FUROSEMIDE WHEN NEEDED AS DIRECTED   Mar Daring, PA-C 03/16/2021 Active   pregabalin (LYRICA) 225 MG capsule [id number] No TAKE 1 CAPSULE(225 MG) BY MOUTH DAILY  Patient taking differently: Take 225 mg by mouth daily.   Mar Daring, [place] 03/15/2021 Unknown time Active            Med Note Stan Head, [place]   Fri Mar 15, 2021 10:57 AM) Dewaine Conger at bedtime            Patient Active Problem List   Diagnosis Date Noted   Spondylolisthesis of lumbar region 03/19/2021   Spondylolisthesis, lumbar region 03/18/2021   Lymphedema 10/30/2019   Type 1 diabetes mellitus with diabetic nephropathy ([place]) 09/01/2018   Type 1 diabetes mellitus with polyneuropathy ([place]) 09/01/2018   Benign essential HTN 03/03/2018   Precordial pain 03/03/2018   Hyperlipidemia, mixed 02/19/2018   Disequilibrium 02/10/2018   Headache disorder 02/10/2018   Osteopenia 01/13/2018   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 ([place]) 08/28/2017   Hepatic cyst 08/28/2017   Tinnitus of right ear 08/28/2017   Hearing loss of right ear 08/28/2017   Constipation 08/28/2017   Pure hypercholesterolemia 05/29/2017   Background diabetic retinopathy ([place]) 05/22/2015   Hypothyroidism 10/10/2014   Diabetic hypoglycemia ([place]) 10/10/2014   Encounter for screening colonoscopy 03/22/2014   Varicose veins of lower extremities with other complications 18/84/1660    Immunization History  Administered Date(s) Administered   Influenza Split 09/02/2014   Influenza, High Dose Seasonal PF 09/01/2018   Influenza,inj,Quad PF,6+ Mos 08/28/2017   Influenza-Unspecified 10/17/2016, 09/21/2019   PFIZER(Purple Top)SARS-COV-2 Vaccination 02/01/2020, 02/22/2020   Pneumococcal Conjugate-13  02/01/2014   Pneumococcal Polysaccharide-23 09/02/2014, 10/19/2019   Tdap 11/26/2016   Zoster, Live 02/28/2014    Conditions to be addressed/monitored:  Hypertension, Hyperlipidemia, Diabetes, Hypothyroidism and Osteopenia  Care Plan : General Pharmacy (Adult)  Updates made by Germaine Pomfret, [place] since 06/06/2021 12:00 AM     Problem: Hypertension, Hyperlipidemia, Diabetes, Hypothyroidism and Osteopenia   Priority: High     Long-Range Goal: Patient-Specific Goal   Start Date: 02/26/2021  Expected End Date: 08/30/2021  This Visit's Progress: On track  Recent Progress: On track  Priority: High  Note:   Current Barriers:  Unable to independently afford treatment regimen  Pharmacist Clinical Goal(s):  Over the next 90 days, patient will maintain control of diabetes as evidenced by A1c less than 8%  through collaboration with PharmD and provider.   Interventions: 1:1 collaboration with Mar Daring, PA-C regarding development and update of comprehensive plan of care as evidenced by provider attestation and co-signature Inter-disciplinary care team collaboration (see longitudinal plan of care) Comprehensive medication review performed; medication list updated in electronic medical record  Hypertension (BP goal <140/90) -Controlled -Current treatment: Furosemide 20 mg daily  -Medications previously tried: Amlodipine, Lisinopril, Torsemide  -Current home readings: NA -Current dietary habits: Patient tries to follow a low-carb, low sodium diet -Current exercise habits: Walking -Denies hypotensive/hypertensive symptoms -Educated on Daily salt intake goal < 2300 mg; Importance of home blood pressure monitoring; -Counseled to monitor BP at home weekly, document, and provide log at future appointments -Recommended to continue current medication  Hyperlipidemia: (LDL goal < 70) -Uncontrolled -Current treatment: None -Medications previously tried: Atorvastatin 10 mg    -Educated on Importance of limiting foods high in  cholesterol; -Counseled on diet and exercise extensively  Diabetes (A1c goal <8%) -Controlled -Current medications: Novolog up to 20 units three times daily  1 unit per 15 g carbs 1 unit for every 50 glucose > 200 mg/dL Lantus 20 units with breakfast    -Medications previously tried: NA  -Patient has received her Dexcom G6 supplies and reports she is really enjoying having them to help her manage her blood sugars. Patient consented to sharing CGM data with clinical pharmacist and 2-week glucose report downloaded via [place] Portal.   [employer] 5/28-6/10  Number of days worn ? 14 days 14  % of time active ? 70% 93%  Mean Glucose (mg/dL)  159  GMI (2-week A1c estimate)  7.1%  Standard Deviation (mg/dL)  53  Time above >250 mg/dL <10% 7%  Time above 70-180 mg/dL <50% 23%  Time in range: 70-180 mg/dL >50% 69%  Time below 70 mg/dL <1% <1%  -Reports hypoglycemic symptoms: A few instances of lows overnight/early morning. Felt weak, shaky, sweaty.  -Continue current medications   Osteopenia (Goal Maintain bone density and prevent fractures) -Controlled -Patient is not a candidate for pharmacologic treatment -Current treatment  Vitamin D 10,000 units   -Medications previously tried: NA  -Recommend [phone] units of vitamin D daily. Recommend 1200 mg of calcium daily from dietary and supplemental sources. -Recommended to continue current medication  Hypothyroidism (Goal: maintain stable thyroid function) -Controlled -Current treatment  Levothyroxine 100 mcg daily before breakfast  -Medications previously tried: NA  -Recommended to continue current medication  GERD (Goal: Minimize symptoms of heartburn or reflux) -Controlled -Current treatment  Omeprazole 40 mg daily  -Medications previously tried: NA  -Patient reports significant reflux with single missed dose - Counseled on importance of trigger avoidance -Recommended to  continue current medication  Patient Goals/Self-Care Activities Over the next 90 days, patient will:  - check glucose at LEAST 3 times daily , document, and provide at future appointments check blood pressure weekly, document, and provide at future appointments  Follow Up Plan: Telephone follow up appointment with care management team member scheduled for:  08/30/2021 at 10:00 AM       Medication Assistance: Application for Novolog, Lantus  medication assistance program. in process.  Anticipated assistance start date 04/20/2021.  See plan of care for additional detail.  Patient's preferred pharmacy is:  [id number] [address] Heal, [place] AT [facility] OF [address] [place] [address] Phone: [phone] Fax: [phone]  Uses pill box? Yes Pt endorses 100% compliance  We discussed: Current pharmacy is preferred with insurance plan and patient is satisfied with pharmacy services Patient decided to: Continue current medication management strategy  Care Plan and Follow Up Patient Decision:  Patient agrees to Care Plan and Follow-up.  Plan: Telephone follow up appointment with care management team member scheduled for:  08/30/2021 at 10:00 AM  [place] [phone]

## 2021-06-06 ENCOUNTER — Ambulatory Visit
Admission: RE | Admit: 2021-06-06 | Discharge: 2021-06-06 | Disposition: A | Discharge status: Home / Self Care | Payer: Medicare Other | Source: Ambulatory Visit | Attending: Obstetrics and Gynecology | Admitting: Obstetrics and Gynecology

## 2021-06-06 ENCOUNTER — Other Ambulatory Visit: Payer: Self-pay

## 2021-06-06 DIAGNOSIS — Z1231 Encounter for screening mammogram for malignant neoplasm of breast: Secondary | ICD-10-CM | POA: Diagnosis not present

## 2021-06-06 NOTE — Patient Instructions (Signed)
Visit Information It was great speaking with you today!  Please let me know if you have any questions about our visit.   Goals Addressed             This Visit's Progress    Monitor and Manage My Blood Sugar-Diabetes Type 1   On track    Timeframe:  Long-Range Goal Priority:  High Start Date: 02/26/2021                            Expected End Date: 08/29/2021                      Follow Up Date 09/20/2021    - check blood sugar at prescribed times - check blood sugar if I feel it is too high or too low - take the blood sugar meter to all doctor visits    Why is this important?   Checking your blood sugar at home helps to keep it from getting very high or very low.  Writing the results in a diary or log helps the doctor know how to care for you.  Your blood sugar log should have the time, the date and the results.  Also, write down the amount of insulin or other medicine you take.  Other information like what you ate, exercise done and how you were feeling will also be helpful..     Notes:          Patient Care Plan: Diabetes Type 1 (Adult)     Problem Identified: Glycemic Management (Diabetes, Type 1)      Long-Range Goal: Glycemic Management Optimized   Start Date: 02/21/2021  Expected End Date: 06/21/2021  Priority: High  Note:   Objective:   Lab Results  Component Value Date   HGBA1C 7.9 (H) 03/18/2021     Lab Results  Component Value Date   CREATININE 0.77 03/19/2021     Current Barriers:  Chronic Disease Management support and educational needs r/t Diabetes.  Case Manager Clinical Goal(s):  Over the next 120 days, patient not require emergent care d/t complications r/t hypoglycemia. Over the next 120 days, patient will demonstrate adherence to prescribed treatment plan for Diabetes self management as evidenced by consistently assessing readings on her continuous glucose monitoring device, taking medications as prescribed and adhering to the recommended  ADA/carb modified diet.   Interventions:  Collaboration with [name], MD, regarding development and update of comprehensive plan of care as evidenced by provider attestation and co-signature Inter-disciplinary care team collaboration (see longitudinal plan of care) Reviewed compliance with current treatment plan. Reports excellent compliance with medications and insulin administration.  Reports fasting readings have ranged in the 120's and 130's. Reviewed s/sx of hypoglycemia and hyperglycemia. Denies symptoms since the last outreach. Reports doing very well with her nutritional intake. Her ability to exercise remains limited d/t activity restrictions r/t recent lumbar fusion. Overall feels that she is doing very well with diabetes self-management.    Patient Goals/Self-Care Activities -Self administer oral medications and insulin as prescribed -Attend all scheduled provider appointments -Adhere to prescribed ADA/carb modified -Notify provider or care management team with questions and new concerns as needed   Follow Up Plan:  Will follow up next month    Patient Care Plan: Hypertension (Adult)     Problem Identified: Hypertension (Hypertension)      Long-Range Goal: Hypertension Monitored Completed 05/21/2021  Start Date: 02/21/2021  Expected End  Date: 06/21/2021  Priority: High  Note:     Current Barriers:  Chronic Disease Management support and educational needs r/t Hypertension and Hyperlipidemia.  Case Manager Clinical Goal(s):  Over the next 120 days, patient will demonstrate improved adherence to prescribed treatment plan as evidenced by taking all medications as prescribed, monitoring and recording blood pressure and adhering to a cardiac prudent/diabetic diet.   Interventions:  Collaboration with [name], MD, regarding development and update of comprehensive plan of care as evidenced by provider attestation and co-signature Inter-disciplinary care  team collaboration (see longitudinal plan of care) Reviewed medications and compliance with treatment recommendations. Reports excellent compliance with medications. Tolerating prescribed regimen and attempting to adhere to the recommended diet. Reviewed established BP parameters and indications for notifying a provider. Reports monitoring BP as advised. Reports readings have been very good and within range. Her activity/ability to exercise remains limited d/t restrictions r/t recent lumbar fusion.  Reviewed s/sx of heart attack, stroke and worsening symptoms that require immediate medical attention.   Patient Goals/Self-Care Activities: -Self-administer medications as prescribed -Attend all scheduled provider appointments -Complete labs as indicated -Monitor and record blood pressure -Adhere to recommended cardiac prudent/heart healthy diet -Notify provider or care management team with questions and new concerns as needed   Goal Met    Patient Care Plan: General Pharmacy (Adult)     Problem Identified: Hypertension, Hyperlipidemia, Diabetes, Hypothyroidism and Osteopenia   Priority: High     Long-Range Goal: Patient-Specific Goal   Start Date: 02/26/2021  Expected End Date: 08/30/2021  This Visit's Progress: On track  Recent Progress: On track  Priority: High  Note:   Current Barriers:  Unable to independently afford treatment regimen  Pharmacist Clinical Goal(s):  Over the next 90 days, patient will maintain control of diabetes as evidenced by A1c less than 8%  through collaboration with PharmD and provider.   Interventions: 1:1 collaboration with Mar Daring, PA-C regarding development and update of comprehensive plan of care as evidenced by provider attestation and co-signature Inter-disciplinary care team collaboration (see longitudinal plan of care) Comprehensive medication review performed; medication list updated in electronic medical record  Hypertension (BP  goal <140/90) -Controlled -Current treatment: Furosemide 20 mg daily  -Medications previously tried: Amlodipine, Lisinopril, Torsemide  -Current home readings: NA -Current dietary habits: Patient tries to follow a low-carb, low sodium diet -Current exercise habits: Walking -Denies hypotensive/hypertensive symptoms -Educated on Daily salt intake goal < 2300 mg; Importance of home blood pressure monitoring; -Counseled to monitor BP at home weekly, document, and provide log at future appointments -Recommended to continue current medication  Hyperlipidemia: (LDL goal < 70) -Uncontrolled -Current treatment: None -Medications previously tried: Atorvastatin 10 mg   -Educated on Importance of limiting foods high in cholesterol; -Counseled on diet and exercise extensively  Diabetes (A1c goal <8%) -Controlled -Current medications: Novolog up to 20 units three times daily  1 unit per 15 g carbs 1 unit for every 50 glucose > 200 mg/dL Lantus 20 units with breakfast    -Medications previously tried: NA  -Patient has received her Dexcom G6 supplies and reports she is really enjoying having them to help her manage her blood sugars. Patient consented to sharing CGM data with clinical pharmacist and 2-week glucose report downloaded via [facility].   Target 5/28-6/10  Number of days worn ? 14 days 14  % of time active ? 70% 93%  Mean Glucose (mg/dL)  159  GMI (2-week A1c estimate)  7.1%  Standard Deviation (  mg/dL)  53  Time above >250 mg/dL <10% 7%  Time above 70-180 mg/dL <50% 23%  Time in range: 70-180 mg/dL >50% 69%  Time below 70 mg/dL <1% <1%  -Reports hypoglycemic symptoms: A few instances of lows overnight/early morning. Felt weak, shaky, sweaty.  -Continue current medications   Osteopenia (Goal Maintain bone density and prevent fractures) -Controlled -Patient is not a candidate for pharmacologic treatment -Current treatment  Vitamin D 10,000 units   -Medications  previously tried: NA  -Recommend [phone] units of vitamin D daily. Recommend 1200 mg of calcium daily from dietary and supplemental sources. -Recommended to continue current medication  Hypothyroidism (Goal: maintain stable thyroid function) -Controlled -Current treatment  Levothyroxine 100 mcg daily before breakfast  -Medications previously tried: NA  -Recommended to continue current medication  GERD (Goal: Minimize symptoms of heartburn or reflux) -Controlled -Current treatment  Omeprazole 40 mg daily  -Medications previously tried: NA  -Patient reports significant reflux with single missed dose - Counseled on importance of trigger avoidance -Recommended to continue current medication  Patient Goals/Self-Care Activities Over the next 90 days, patient will:  - check glucose at LEAST 3 times daily , document, and provide at future appointments check blood pressure weekly, document, and provide at future appointments  Follow Up Plan: Telephone follow up appointment with care management team member scheduled for:  08/30/2021 at 10:00 AM    Patient Care Plan: Fall Risk (Adult)     Problem Identified: Fall Risk      Long-Range Goal: Absence of Fall and Fall-Related Injury   Start Date: 05/21/2021  Expected End Date: 08/19/2021  Priority: High  Note:   Current Barriers:  Risk for Falls d/t Impaired Gait r/t Lumbar Fusion  Clinical Goal(s):  Over the next 90 days, patient will not experience falls and maintain an optimal level of function.   Interventions:  Collaboration with [name], MD regarding development and update of comprehensive plan of care as evidenced by provider attestation and co-signature Inter-disciplinary care team collaboration (see longitudinal plan of care) S/P Lumbar Fusion. Reviewed current treatment plan and activity restrictions. Continues to follow the recommended activity and weight restrictions, however she experienced a fall on yesterday.  Reports being at an outdoor gathering on yesterday and attempting to ambulate down the steps while holding items. Accidentally missed the bottom step resulting in a fall. Denies open wounds or visible injuries. Reports standing and ambulating without difficulty following the fall. She reports discomfort and soreness to her buttocks and hips.  Discussed concerns r/t recent lumbar fusion. Pending a return call from the Neurosurgical team regarding recommendation for follow-up and imaging. Reviewed measures safety and fall prevention. She continues to complete ADL's and self-care independently. Family and friends remain available to assist as needed.   Self-Care Deficits/Patient Goals:  -Utilize assistive device appropriately with all ambulation -Ensure pathways are clear and well lit -Change positions slowly  -Follow recommended lifting and activity restrictions -Wear secure fitting, skid free footwear when ambulating -Follow up with the Neurosurgical team for imaging as indicated -Notify provider or care management team for questions and new concerns as needed   Follow Up Plan:  Will follow up within the next month      Patient agreed to services and verbal consent obtained.   Patient verbalizes understanding of instructions provided today and agrees to view in [place].   [name], PharmD, [place] [phone]

## 2021-06-13 ENCOUNTER — Encounter: Payer: Self-pay | Admitting: Family Medicine

## 2021-06-13 ENCOUNTER — Other Ambulatory Visit: Payer: Self-pay

## 2021-06-13 ENCOUNTER — Ambulatory Visit (INDEPENDENT_AMBULATORY_CARE_PROVIDER_SITE_OTHER): Payer: Medicare Other | Admitting: Family Medicine

## 2021-06-13 VITALS — BP 128/76 | HR 97 | Resp 16 | Wt 187.2 lb

## 2021-06-13 DIAGNOSIS — E1159 Type 2 diabetes mellitus with other circulatory complications: Secondary | ICD-10-CM | POA: Diagnosis not present

## 2021-06-13 DIAGNOSIS — D649 Anemia, unspecified: Secondary | ICD-10-CM

## 2021-06-13 DIAGNOSIS — E669 Obesity, unspecified: Secondary | ICD-10-CM

## 2021-06-13 DIAGNOSIS — E1042 Type 1 diabetes mellitus with diabetic polyneuropathy: Secondary | ICD-10-CM | POA: Diagnosis not present

## 2021-06-13 DIAGNOSIS — E785 Hyperlipidemia, unspecified: Secondary | ICD-10-CM

## 2021-06-13 DIAGNOSIS — Z6831 Body mass index (BMI) 31.0-31.9, adult: Secondary | ICD-10-CM

## 2021-06-13 DIAGNOSIS — E1069 Type 1 diabetes mellitus with other specified complication: Secondary | ICD-10-CM | POA: Diagnosis not present

## 2021-06-13 DIAGNOSIS — E11649 Type 2 diabetes mellitus with hypoglycemia without coma: Secondary | ICD-10-CM | POA: Diagnosis not present

## 2021-06-13 DIAGNOSIS — E113299 Type 2 diabetes mellitus with mild nonproliferative diabetic retinopathy without macular edema, unspecified eye: Secondary | ICD-10-CM

## 2021-06-13 DIAGNOSIS — I152 Hypertension secondary to endocrine disorders: Secondary | ICD-10-CM | POA: Diagnosis not present

## 2021-06-13 DIAGNOSIS — E103293 Type 1 diabetes mellitus with mild nonproliferative diabetic retinopathy without macular edema, bilateral: Secondary | ICD-10-CM | POA: Diagnosis not present

## 2021-06-13 DIAGNOSIS — E039 Hypothyroidism, unspecified: Secondary | ICD-10-CM | POA: Diagnosis not present

## 2021-06-13 DIAGNOSIS — E1021 Type 1 diabetes mellitus with diabetic nephropathy: Secondary | ICD-10-CM

## 2021-06-13 LAB — POCT GLYCOSYLATED HEMOGLOBIN (HGB A1C): Hemoglobin A1C: 7.6 % — AB (ref 4.0–5.6)

## 2021-06-13 MED ORDER — LANTUS SOLOSTAR 100 UNIT/ML ~~LOC~~ SOPN: 20.0000 [IU] | PEN_INJECTOR | Freq: Every day | SUBCUTANEOUS | 3 refills | Status: DC

## 2021-06-13 MED ORDER — INSULIN PEN NEEDLE 31G X 8 MM MISC: 5 refills | Status: DC

## 2021-06-13 MED ORDER — ROSUVASTATIN CALCIUM 5 MG PO TABS: 5.0000 mg | ORAL_TABLET | Freq: Every day | ORAL | 3 refills | Status: DC

## 2021-06-13 NOTE — Assessment & Plan Note (Signed)
Previously well controlled Continue Synthroid at current dose  Recheck TSH and adjust Synthroid as indicated   

## 2021-06-13 NOTE — Assessment & Plan Note (Signed)
Reviewed last lipid panel Discussed importance of statin therapy and diabetes for ASCVD prevention Patient with joint pain on atorvastatin in the past Trial of Crestor 5 mg daily Take co-Q10 supplement as well At follow-up, repeat FLP and CMP Goal LDL less than 70

## 2021-06-13 NOTE — Assessment & Plan Note (Signed)
Fairly well-controlled with A1c 7.6 Due to fluctuating blood sugars with hypoglycemia, will except higher A1c goal Reviewed screenings Continue current medications Discussed diet and exercise Start statin as above Follow-up in 6 months and repeat A1c

## 2021-06-13 NOTE — Assessment & Plan Note (Signed)
Discussed importance of healthy weight management Discussed diet and exercise  

## 2021-06-13 NOTE — Assessment & Plan Note (Signed)
Followed by [place]

## 2021-06-13 NOTE — Progress Notes (Signed)
Established patient visit   Patient: Karla Patton   DOB: 12-30-1950   70 y.o. Female  MRN: [id number] Visit Date: 06/13/2021  Today's healthcare provider: [name], MD   Chief Complaint  Patient presents with   Diabetes   Hypothyroidism   Hypertension   Subjective    Diabetes Pertinent negatives for hypoglycemia include no dizziness or headaches. Pertinent negatives for diabetes include no chest pain, no fatigue and no weakness.  Hypertension Pertinent negatives include no chest pain, headaches, neck pain, palpitations or shortness of breath.    Anemia  She reports her hemoglobin levels have always been low. She had a full work-up before her lumbar surgery.   Lumbar Surgery  She had back surgery on March 28. She reports recovering well.   Cholesterol She was taking atorvastatin however her insurance wouldn't cover the cost. She also was experiencing side effects and has been without medication for 3 months.   Diabetes Mellitus Type II, Follow-up  Lab Results  Component Value Date   HGBA1C 7.6 (A) 06/13/2021   HGBA1C 7.9 (H) 03/18/2021   HGBA1C 6.8 (A) 01/28/2021   Wt Readings from Last 3 Encounters:  06/13/21 187 lb 3.2 oz (84.9 kg)  03/18/21 194 lb (88 kg)  01/28/21 194 lb 6.4 oz (88.2 kg)   Last seen for diabetes 4 months ago.  Management since then includes No change, continue current medications. She reports excellent compliance with treatment. She is not having side effects.  Symptoms: No fatigue No foot ulcerations  No appetite changes No nausea  No paresthesia of the feet  No polydipsia  No polyuria No visual disturbances   No vomiting    She was diagnosed in her early 37s. In the past she wore an insulin pump for around 3 years.  Home blood sugar records:  97  Episodes of hypoglycemia? No    Current insulin regiment: Novolog up to 20 units three times daily 1 unit per 15 g carbs 1 unit for every 50 glucose > 200 mg/dL Lantus 20  units with breakfast Most Recent Eye Exam: UTD Next Eye Exam is in August Current exercise: no regular exercise Current diet habits: well balanced  Pertinent Labs: Lab Results  Component Value Date   CHOL 220 (H) 01/28/2021   HDL 57 01/28/2021   LDLCALC 142 (H) 01/28/2021   TRIG 116 01/28/2021   CHOLHDL 3.4 01/20/2020   Lab Results  Component Value Date   NA 129 (L) 03/19/2021   K 3.7 03/19/2021   CREATININE 0.77 03/19/2021   GFRNONAA >60 03/19/2021   GFRAA 95 01/28/2021   GLUCOSE 347 (H) 03/19/2021     --------------------------------------------------------------------------------------------------- Hypothyroid, follow-up  Lab Results  Component Value Date   TSH 2.380 01/28/2021   TSH 2.050 01/20/2020   TSH 3.700 12/06/2018   Wt Readings from Last 3 Encounters:  06/13/21 187 lb 3.2 oz (84.9 kg)  03/18/21 194 lb (88 kg)  01/28/21 194 lb 6.4 oz (88.2 kg)    She was last seen for hypothyroid 4 months ago.  Management since that visit includes No change, continue current medications. She reports excellent compliance with treatment. She is not having side effects.   Symptoms: No change in energy level Yes constipation  No diarrhea No heat / cold intolerance  No nervousness No palpitations  No weight changes    ----------------------------------------------------------------------------------------- Hypertension, follow-up  BP Readings from Last 3 Encounters:  06/13/21 128/76  03/19/21 (!) 125/56  01/28/21 (!) 149/71  Wt Readings from Last 3 Encounters:  06/13/21 187 lb 3.2 oz (84.9 kg)  03/18/21 194 lb (88 kg)  01/28/21 194 lb 6.4 oz (88.2 kg)     She was last seen for hypertension 4 months ago.  BP at that visit was 149/71. Management since that visit includes No change, continue current medications.  She reports excellent compliance with treatment. She is not having side effects.  She is following a Regular diet. She is not exercising. She does  not smoke.  Use of agents associated with hypertension: none.   Outside blood pressures are . Symptoms: No chest pain Yes chest pressure  No palpitations No syncope  No dyspnea No orthopnea  No paroxysmal nocturnal dyspnea No lower extremity edema   Pertinent labs: Lab Results  Component Value Date   CHOL 220 (H) 01/28/2021   HDL 57 01/28/2021   LDLCALC 142 (H) 01/28/2021   TRIG 116 01/28/2021   CHOLHDL 3.4 01/20/2020   Lab Results  Component Value Date   NA 129 (L) 03/19/2021   K 3.7 03/19/2021   CREATININE 0.77 03/19/2021   GFRNONAA >60 03/19/2021   GFRAA 95 01/28/2021   GLUCOSE 347 (H) 03/19/2021     The 10-year ASCVD risk score Mikey Bussing DC Jr., et al., 2013) is: 19.3%   ---------------------------------------------------------------------------------------------------   Patient Active Problem List   Diagnosis Date Noted   Hyperlipidemia due to type 1 diabetes mellitus ([place]) 06/13/2021   Anemia 06/13/2021   Class 1 obesity with serious comorbidity and body mass index (BMI) of 31.0 to 31.9 in adult 06/13/2021   Spondylolisthesis of lumbar region 03/19/2021   Spondylolisthesis, lumbar region 03/18/2021   Lymphedema 10/30/2019   Type 1 diabetes mellitus with diabetic nephropathy ([place]) 09/01/2018   Type 1 diabetes mellitus with polyneuropathy ([place]) 09/01/2018   Hypertension associated with diabetes ([place]) 03/03/2018   Precordial pain 03/03/2018   Headache disorder 02/10/2018   Osteopenia 01/13/2018   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 ([place]) 08/28/2017   Hepatic cyst 08/28/2017   Tinnitus of right ear 08/28/2017   Hearing loss of right ear 08/28/2017   Constipation 08/28/2017   Background diabetic retinopathy ([place]) 05/22/2015   Hypothyroidism 10/10/2014   Diabetic hypoglycemia ([place]) 10/10/2014   Varicose veins of lower extremities with other complications 41/28/7867   Past Medical History:  Diagnosis  Date   Anemia    Anxiety    Arthritis    Back pain    Diabetes mellitus without complication (HCC)    type 1   GERD (gastroesophageal reflux disease)    Hypothyroidism    Thyroid disease    Varicose vein    No Known Allergies  Medications: Outpatient Medications Prior to Visit  Medication Sig   acetaminophen (TYLENOL) 500 MG tablet Take 1,000 mg by mouth daily as needed.   ALPRAZolam (XANAX) 0.5 MG tablet TAKE 1 TABLET(0.5 MG) BY MOUTH TWICE DAILY (Patient taking differently: Take 0.5 mg by mouth at bedtime.)   APPLE CIDER VINEGAR PO Take 600 mg by mouth daily.    aspirin EC 81 MG tablet Take 81 mg by mouth daily. Takes at bedtime   beta carotene 25000 UNIT capsule Take 25,000 Units by mouth daily.   Cholecalciferol (VITAMIN D3) 250 MCG (10000 UT) capsule Take 10,000 Units by mouth daily.   Coenzyme Q10 (COQ10) 100 MG CAPS Take 100 mg by mouth daily. With cinnamon   CONTOUR NEXT TEST test strip CHECK BLOOD SUGAR BEFORE  MEALS AND AT BEDTIME   cyclobenzaprine (FLEXERIL) 10 MG tablet Take 1 tablet (10 mg total) by mouth 3 (three) times daily as needed for muscle spasms.   Ferrous Sulfate Dried (HIGH POTENCY IRON) 65 MG TABS Take 1 tablet by mouth daily.   furosemide (LASIX) 20 MG tablet TAKE 1 TABLET(20 MG) BY MOUTH DAILY (Patient taking differently: Take 20 mg by mouth daily. TAKE 1 TABLET(20 MG) BY MOUTH DAILY)   insulin aspart (NOVOLOG FLEXPEN) 100 UNIT/ML FlexPen Inject 5-10 Units into the skin 3 (three) times daily with meals. Per sliding scale   levothyroxine (SYNTHROID) 100 MCG tablet TAKE 1 TABLET BY MOUTH DAILY BEFORE BREAKFAST (Patient taking differently: Take 100 mcg by mouth daily before breakfast. TAKE 1 TABLET BY MOUTH DAILY BEFORE BREAKFAST)   MAGNESIUM-ZINC PO Take 1 tablet by mouth daily.   Multiple Vitamins-Minerals (ABC PLUS SENIOR ADULTS 50+ PO) Take 1 tablet by mouth daily.   omeprazole (PRILOSEC) 40 MG capsule TAKE 1 CAPSULE(40 MG) BY MOUTH DAILY   potassium  chloride SA (KLOR-CON) 20 MEQ tablet TAKE 1 TABLET BY MOUTH EVERY DAY. TAKE WITH FUROSEMIDE WHEN NEEDED AS DIRECTED (Patient taking differently: Take 20 mEq by mouth daily. TAKE 1 TABLET BY MOUTH EVERY DAY. TAKE WITH FUROSEMIDE WHEN NEEDED AS DIRECTED)   pregabalin (LYRICA) 225 MG capsule TAKE 1 CAPSULE(225 MG) BY MOUTH DAILY (Patient taking differently: Take 225 mg by mouth daily.)   [DISCONTINUED] docusate sodium (COLACE) 100 MG capsule Take 1 capsule (100 mg total) by mouth 2 (two) times daily.   [DISCONTINUED] insulin glargine (LANTUS SOLOSTAR) 100 UNIT/ML Solostar Pen Inject 10 Units into the skin 2 (two) times daily. (Patient taking differently: Inject 20 Units into the skin daily with breakfast.)   [DISCONTINUED] oxyCODONE (OXY IR/ROXICODONE) 5 MG immediate release tablet Take 1 tablet (5 mg total) by mouth every 4 (four) hours as needed for moderate pain ((score 4 to 6)).   [DISCONTINUED] polyethylene glycol (MIRALAX / GLYCOLAX) 17 g packet Take 17 g by mouth daily as needed.   No facility-administered medications prior to visit.    Review of Systems  Constitutional:  Negative for chills, fatigue and fever.  HENT:  Negative for ear pain, sinus pressure, sinus pain and sore throat.   Eyes:  Negative for pain.  Respiratory:  Negative for cough, chest tightness, shortness of breath and wheezing.   Cardiovascular:  Negative for chest pain, palpitations and leg swelling.  Gastrointestinal:  Negative for abdominal pain, blood in stool, diarrhea, nausea and vomiting.  Genitourinary:  Negative for dysuria, flank pain, frequency, pelvic pain and urgency.  Musculoskeletal:  Negative for back pain, myalgias and neck pain.  Neurological:  Negative for dizziness, weakness, light-headedness, numbness and headaches.      Objective    BP 128/76   Pulse 97   Resp 16   Wt 187 lb 3.2 oz (84.9 kg)   SpO2 97%   BMI 31.15 kg/m     Physical Exam Vitals reviewed.  Constitutional:      General:  She is not in acute distress.    Appearance: Normal appearance. She is well-developed. She is not diaphoretic.  HENT:     Head: Normocephalic and atraumatic.  Eyes:     General: No scleral icterus.    Conjunctiva/sclera: Conjunctivae normal.  Neck:     Thyroid: No thyromegaly.  Cardiovascular:     Rate and Rhythm: Normal rate and regular rhythm.     Pulses: Normal pulses.     Heart  sounds: Murmur heard.  Pulmonary:     Effort: Pulmonary effort is normal. No respiratory distress.     Breath sounds: Normal breath sounds. No wheezing, rhonchi or rales.  Musculoskeletal:     Cervical back: Neck supple.     Right lower leg: No edema.     Left lower leg: No edema.  Lymphadenopathy:     Cervical: No cervical adenopathy.  Skin:    General: Skin is warm and dry.     Findings: No rash.  Neurological:     Mental Status: She is alert and oriented to person, place, and time. Mental status is at baseline.  Psychiatric:        Mood and Affect: Mood normal.        Behavior: Behavior normal.     Results for orders placed or performed in visit on 06/13/21  POCT glycosylated hemoglobin (Hb A1C)  Result Value Ref Range   Hemoglobin A1C 7.6 (A) 4.0 - 5.6 %   HbA1c POC (<> result, manual entry)     HbA1c, POC (prediabetic range)     HbA1c, POC (controlled diabetic range)      Assessment & Plan     Problem List Items Addressed This Visit       Cardiovascular and Mediastinum   Hypertension associated with diabetes ([place])    Well controlled Continue current medications Recheck metabolic panel F/u in 6 months        Relevant Medications   insulin glargine (LANTUS SOLOSTAR) 100 UNIT/ML Solostar Pen   rosuvastatin (CRESTOR) 5 MG tablet   Other Relevant Orders   Comprehensive metabolic panel     Endocrine   Hypothyroidism    Previously well controlled Continue Synthroid at current dose  Recheck TSH and adjust Synthroid as indicated         Relevant Orders   TSH   Diabetic  hypoglycemia ([place])    Overall decreased in frequency She knows to avoid skipping meals and to eat a consistent times Will except higher A1c goal in the setting       Relevant Medications   insulin glargine (LANTUS SOLOSTAR) 100 UNIT/ML Solostar Pen   rosuvastatin (CRESTOR) 5 MG tablet   Background diabetic retinopathy ([place])    Followed by Optho       Relevant Medications   insulin glargine (LANTUS SOLOSTAR) 100 UNIT/ML Solostar Pen   rosuvastatin (CRESTOR) 5 MG tablet   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 (HCC) - Primary   Relevant Medications   insulin glargine (LANTUS SOLOSTAR) 100 UNIT/ML Solostar Pen   rosuvastatin (CRESTOR) 5 MG tablet   Other Relevant Orders   POCT glycosylated hemoglobin (Hb A1C) (Completed)   Type 1 diabetes mellitus with diabetic nephropathy (HCC)    Fairly well-controlled with A1c 7.6 Due to fluctuating blood sugars with hypoglycemia, will except higher A1c goal Reviewed screenings Continue current medications Discussed diet and exercise Start statin as above Follow-up in 6 months and repeat A1c       Relevant Medications   insulin glargine (LANTUS SOLOSTAR) 100 UNIT/ML Solostar Pen   rosuvastatin (CRESTOR) 5 MG tablet   Type 1 diabetes mellitus with polyneuropathy (HCC)   Relevant Medications   insulin glargine (LANTUS SOLOSTAR) 100 UNIT/ML Solostar Pen   rosuvastatin (CRESTOR) 5 MG tablet   Hyperlipidemia due to type 1 diabetes mellitus ([place])    Reviewed last lipid panel Discussed importance of statin therapy and diabetes for ASCVD prevention Patient  with joint pain on atorvastatin in the past Trial of Crestor 5 mg daily Take co-Q10 supplement as well At follow-up, repeat FLP and CMP Goal LDL less than 70       Relevant Medications   insulin glargine (LANTUS SOLOSTAR) 100 UNIT/ML Solostar Pen   rosuvastatin (CRESTOR) 5 MG tablet   Other Relevant Orders   Comprehensive  metabolic panel     Other   Anemia    Noted around time of her recent lumbar surgery Suspect combination of blood loss and dilutional effect of IV fluids Recheck CBC today       Relevant Orders   CBC w/Diff/Platelet   Class 1 obesity with serious comorbidity and body mass index (BMI) of 31.0 to 31.9 in adult    Discussed importance of healthy weight management Discussed diet and exercise        Relevant Medications   insulin glargine (LANTUS SOLOSTAR) 100 UNIT/ML Solostar Pen    Return in about 6 months (around 12/13/2021) for AWV, CPE.      I,Essence Turner,acting as a scribe for [name], MD.,have documented all relevant documentation on the behalf of [name], MD,as directed by  [name], MD while in the presence of [name], MD.  I, [name], MD, have reviewed all documentation for this visit. The documentation on 06/13/21 for the exam, diagnosis, procedures, and orders are all accurate and complete.   Kerisha Goughnour, Dionne Bucy, MD, MPH [facility]

## 2021-06-13 NOTE — Assessment & Plan Note (Signed)
Noted around time of her recent lumbar surgery Suspect combination of blood loss and dilutional effect of IV fluids Recheck CBC today

## 2021-06-13 NOTE — Assessment & Plan Note (Signed)
Well controlled Continue current medications Recheck metabolic panel F/u in 6 months  

## 2021-06-13 NOTE — Assessment & Plan Note (Addendum)
Overall decreased in frequency She knows to avoid skipping meals and to eat a consistent times Will except higher A1c goal in the setting

## 2021-06-14 ENCOUNTER — Telehealth: Payer: Self-pay

## 2021-06-14 DIAGNOSIS — E1159 Type 2 diabetes mellitus with other circulatory complications: Secondary | ICD-10-CM

## 2021-06-14 DIAGNOSIS — I152 Hypertension secondary to endocrine disorders: Secondary | ICD-10-CM

## 2021-06-14 LAB — COMPREHENSIVE METABOLIC PANEL
ALT: 17 IU/L (ref 0–32)
AST: 18 IU/L (ref 0–40)
Albumin/Globulin Ratio: 1.7 (ref 1.2–2.2)
Albumin: 4.3 g/dL (ref 3.8–4.8)
Alkaline Phosphatase: 117 IU/L (ref 44–121)
BUN/Creatinine Ratio: 16 (ref 12–28)
BUN: 12 mg/dL (ref 8–27)
Bilirubin Total: 0.3 mg/dL (ref 0.0–1.2)
CO2: 23 mmol/L (ref 20–29)
Calcium: 9.2 mg/dL (ref 8.7–10.3)
Chloride: 91 mmol/L — ABNORMAL LOW (ref 96–106)
Creatinine, Ser: 0.73 mg/dL (ref 0.57–1.00)
Globulin, Total: 2.6 g/dL (ref 1.5–4.5)
Glucose: 339 mg/dL — ABNORMAL HIGH (ref 65–99)
Potassium: 5.5 mmol/L — ABNORMAL HIGH (ref 3.5–5.2)
Sodium: 130 mmol/L — ABNORMAL LOW (ref 134–144)
Total Protein: 6.9 g/dL (ref 6.0–8.5)
eGFR: 90 mL/min/{1.73_m2} (ref >59)

## 2021-06-14 LAB — CBC WITH DIFFERENTIAL/PLATELET
Basophils Absolute: 0.1 10*3/uL (ref 0.0–0.2)
Basos: 1 %
EOS (ABSOLUTE): 0.2 10*3/uL (ref 0.0–0.4)
Eos: 2 %
Hematocrit: 34.5 % (ref 34.0–46.6)
Hemoglobin: 11.3 g/dL (ref 11.1–15.9)
Immature Grans (Abs): 0 10*3/uL (ref 0.0–0.1)
Immature Granulocytes: 0 %
Lymphocytes Absolute: 2 10*3/uL (ref 0.7–3.1)
Lymphs: 27 %
MCH: 27.7 pg (ref 26.6–33.0)
MCHC: 32.8 g/dL (ref 31.5–35.7)
MCV: 85 fL (ref 79–97)
Monocytes Absolute: 0.8 10*3/uL (ref 0.1–0.9)
Monocytes: 11 %
Neutrophils Absolute: 4.3 10*3/uL (ref 1.4–7.0)
Neutrophils: 59 %
Platelets: 440 10*3/uL (ref 150–450)
RBC: 4.08 x10E6/uL (ref 3.77–5.28)
RDW: 13.3 % (ref 11.7–15.4)
WBC: 7.4 10*3/uL (ref 3.4–10.8)

## 2021-06-14 LAB — TSH: TSH: 0.988 u[IU]/mL (ref 0.450–4.500)

## 2021-06-14 NOTE — Telephone Encounter (Signed)
-----   Message from Virginia Crews, MD sent at 06/14/2021 12:01 PM EDT ----- Normal labs, except high blood sugar at time labs were taken and high potassium.  She should cut her potassium supplement in half.  Recheck BMP in 2 to 4 weeks

## 2021-06-21 DIAGNOSIS — Z6831 Body mass index (BMI) 31.0-31.9, adult: Secondary | ICD-10-CM | POA: Diagnosis not present

## 2021-06-21 DIAGNOSIS — M4316 Spondylolisthesis, lumbar region: Secondary | ICD-10-CM | POA: Diagnosis not present

## 2021-06-21 DIAGNOSIS — I10 Essential (primary) hypertension: Secondary | ICD-10-CM | POA: Diagnosis not present

## 2021-07-23 ENCOUNTER — Encounter: Payer: Self-pay | Admitting: Family Medicine

## 2021-07-25 ENCOUNTER — Telehealth: Payer: Self-pay

## 2021-07-25 NOTE — Progress Notes (Signed)
Chronic Care Management Pharmacy Assistant   Name: Karla Patton  MRN: [id number] DOB: Sep 02, 1951  Reason for Encounter:Hypertension Disease State Call.   Recent office visits:  06/14/2021  Dr. Brita Romp MD (PCP) cut Potassium supplement in half 06/13/2021 Dr. Brita Romp MD (PCP) start Rosuvastatin 5 mg daily,start CO-Q10 supplement   Recent consult visits:  No recent [facility] visits:  None in previous 6 months  Medications: Outpatient Encounter Medications as of 07/25/2021  Medication Sig Note   acetaminophen (TYLENOL) 500 MG tablet Take 1,000 mg by mouth daily as needed.    ALPRAZolam (XANAX) 0.5 MG tablet TAKE 1 TABLET(0.5 MG) BY MOUTH TWICE DAILY (Patient taking differently: Take 0.5 mg by mouth at bedtime.) 03/29/2021: Taking as prescribed   APPLE CIDER VINEGAR PO Take 600 mg by mouth daily.     aspirin EC 81 MG tablet Take 81 mg by mouth daily. Takes at bedtime    beta carotene 25000 UNIT capsule Take 25,000 Units by mouth daily.    Cholecalciferol (VITAMIN D3) 250 MCG (10000 UT) capsule Take 10,000 Units by mouth daily.    Coenzyme Q10 (COQ10) 100 MG CAPS Take 100 mg by mouth daily. With cinnamon    CONTOUR NEXT TEST test strip CHECK BLOOD SUGAR BEFORE MEALS AND AT BEDTIME    cyclobenzaprine (FLEXERIL) 10 MG tablet Take 1 tablet (10 mg total) by mouth 3 (three) times daily as needed for muscle spasms.    Ferrous Sulfate Dried (HIGH POTENCY IRON) 65 MG TABS Take 1 tablet by mouth daily.    furosemide (LASIX) 20 MG tablet TAKE 1 TABLET(20 MG) BY MOUTH DAILY (Patient taking differently: Take 20 mg by mouth daily. TAKE 1 TABLET(20 MG) BY MOUTH DAILY)    insulin aspart (NOVOLOG FLEXPEN) 100 UNIT/ML FlexPen Inject 5-10 Units into the skin 3 (three) times daily with meals. Per sliding scale    insulin glargine (LANTUS SOLOSTAR) 100 UNIT/ML Solostar Pen Inject 20 Units into the skin daily with breakfast.    Insulin Pen Needle 31G X 8 MM MISC Use to inject insulin 4-6  times daily as prescribed    levothyroxine (SYNTHROID) 100 MCG tablet TAKE 1 TABLET BY MOUTH DAILY BEFORE BREAKFAST (Patient taking differently: Take 100 mcg by mouth daily before breakfast. TAKE 1 TABLET BY MOUTH DAILY BEFORE BREAKFAST)    MAGNESIUM-ZINC PO Take 1 tablet by mouth daily.    Multiple Vitamins-Minerals (ABC PLUS SENIOR ADULTS 50+ PO) Take 1 tablet by mouth daily. 02/26/2021: Centrum Silver    omeprazole (PRILOSEC) 40 MG capsule TAKE 1 CAPSULE(40 MG) BY MOUTH DAILY    potassium chloride SA (KLOR-CON) 20 MEQ tablet TAKE 1 TABLET BY MOUTH EVERY DAY. TAKE WITH FUROSEMIDE WHEN NEEDED AS DIRECTED (Patient taking differently: Take 20 mEq by mouth daily. TAKE 1 TABLET BY MOUTH EVERY DAY. TAKE WITH FUROSEMIDE WHEN NEEDED AS DIRECTED)    pregabalin (LYRICA) 225 MG capsule TAKE 1 CAPSULE(225 MG) BY MOUTH DAILY (Patient taking differently: Take 225 mg by mouth daily.) 03/15/2021: Takes at bedtime   rosuvastatin (CRESTOR) 5 MG tablet Take 1 tablet (5 mg total) by mouth daily.    No facility-administered encounter medications on file as of 07/25/2021.    Care Gaps: Shingrix Vaccine COVID-19 Vaccine Influenza Vaccine Urine Microalbumin Star Rating Drugs: Rosuvastatin 5 mg not on fill history. Medication Fill Gaps: Insulin aspart 100 UNIT/ML FlexPen last filled 01/18/2021 75 day supply Insulin glargine 100 UNIT/ML Solostar Pen last filled 03/13/2021 62 day supply  Levothyroxine 100  MCG last filled 01/18/2021 90 day supply Omeprazole 40 MG last filled 02/09/2021 90 day supply Pregabalin  225 MG last filled 02/11/2021 90 day supply  Reviewed chart prior to disease state call. Spoke with patient regarding BP  Recent Office Vitals: BP Readings from Last 3 Encounters:  06/13/21 128/76  03/19/21 (!) 125/56  01/28/21 (!) 149/71   Pulse Readings from Last 3 Encounters:  06/13/21 97  03/19/21 89  01/28/21 82    Wt Readings from Last 3 Encounters:  06/13/21 187 lb 3.2 oz (84.9 kg)  03/18/21  194 lb (88 kg)  01/28/21 194 lb 6.4 oz (88.2 kg)     Kidney Function Lab Results  Component Value Date/Time   CREATININE 0.73 06/13/2021 04:18 PM   CREATININE 0.77 03/19/2021 04:20 AM   GFR 91.51 03/01/2015 09:55 AM   GFRNONAA >60 03/19/2021 04:20 AM   GFRAA 95 01/28/2021 11:29 AM    BMP Latest Ref Rng & Units 06/13/2021 03/19/2021 03/18/2021  Glucose 65 - 99 mg/dL 339(H) 347(H) 195(H)  BUN 8 - 27 mg/dL '12 13 10  '$ Creatinine 0.57 - 1.00 mg/dL 0.73 0.77 0.50  BUN/Creat Ratio 12 - 28 16 - -  Sodium 134 - 144 mmol/L 130(L) 129(L) 134(L)  Potassium 3.5 - 5.2 mmol/L 5.5(H) 3.7 3.9  Chloride 96 - 106 mmol/L 91(L) 95(L) 97(L)  CO2 20 - 29 mmol/L 23 27 -  Calcium 8.7 - 10.3 mg/dL 9.2 8.4(L) -    Current antihypertensive regimen:  Furosemide 20 mg daily  Patient denies lightheadedness or dizziness but reports headaches here and there,Patient states she had a headache on Tuesday due to the weather being so hot. How often are you checking your Blood Pressure? when feeling symptomatic Current home BP readings:  Patient states her blood pressure was 135/76 one day last week, What recent interventions/DTPs have been made by any provider to improve Blood Pressure control since last CPP Visit: None ID Any recent hospitalizations or ED visits since last visit with CPP? No What diet changes have been made to improve Blood Pressure Control?  Patient states she is following a low carb  low sodium diet.Patient states her legs was swollen so she decrease her sodium intake which help improve the swelling in her legs. What exercise is being done to improve your Blood Pressure Control?  Patient states she is walking her dog four times a day,(morning,lunch,after dinner and before bed) ranging around a mile.  Patient states she has started the [place] program,and her PCP informed her that would be okay for her to do.  Adherence Review: Is the patient currently on ACE/ARB medication? No Does the patient  have >5 day gap between last estimated fill dates? No   Patient is schedule for a telephone follow up with the clinical pharmacist on 08/30/2021.  [place] Pharmacist Assistant [phone]

## 2021-08-02 ENCOUNTER — Other Ambulatory Visit: Payer: Self-pay

## 2021-08-02 DIAGNOSIS — R600 Localized edema: Secondary | ICD-10-CM

## 2021-08-02 IMAGING — CR DG LUMBAR SPINE 1V
1 series · 1 of 1 positions shown · non-contrast
Comparison: None.

CLINICAL DATA: Intraop opening image for L4-L5 fusion

EXAM:
LUMBAR SPINE - 1 VIEW

[xtable lateral]
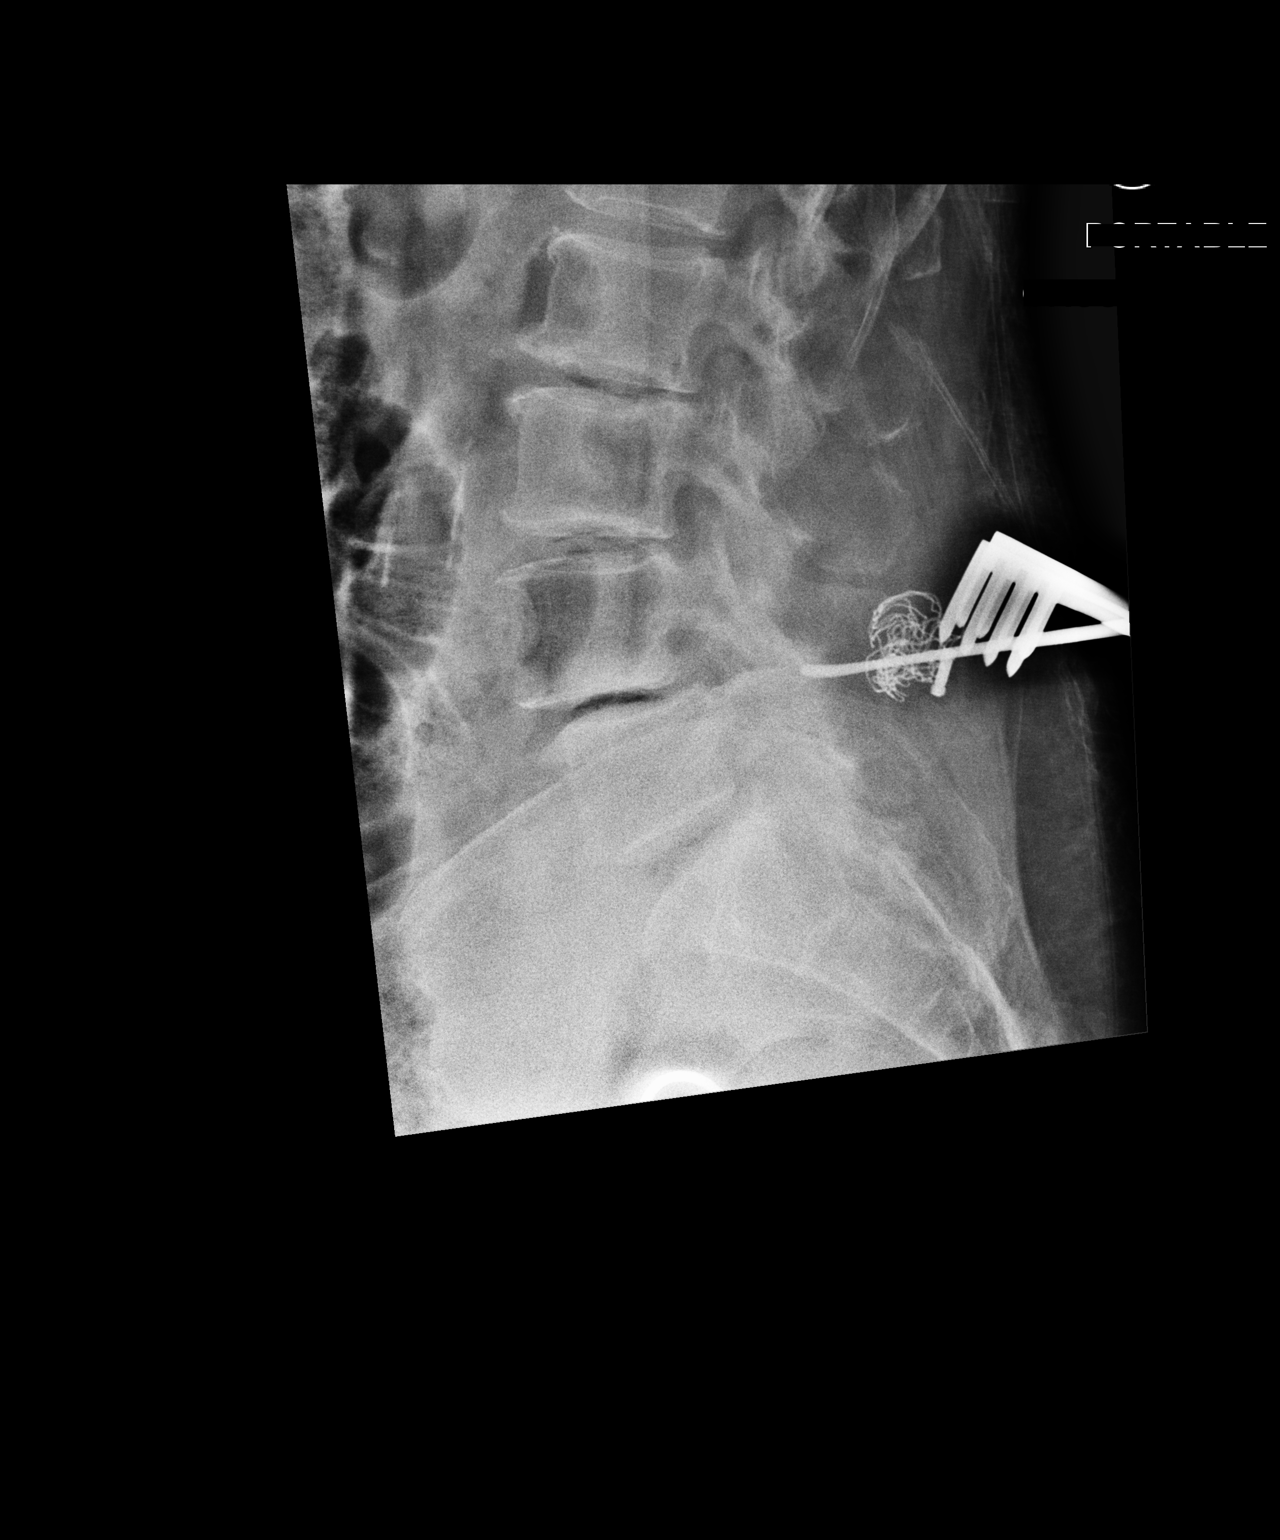

[1 of 1 positions shown; findings below may reference images not displayed]

FINDINGS: Single lateral image demonstrates localizer dorsal to the L4-L5 disc
space overlying the posterior elements between the L4 and L5 spinous
process levels.
IMPRESSION: Localizer as above.

## 2021-08-02 IMAGING — RF DG C-ARM 1-60 MIN
1 series · 4 of 4 positions shown · non-contrast
Comparison: Lateral view intraoperative radiographs of the lumbar
spine 03/18/2021. Lumbar spine MRI 09/17/2020.

CLINICAL DATA: Elective surgery. Additional history provided:
Lumbar 4-5 posterior lumbar interbody fusion, interbody prosthesis,
posterior instrumentation. Provided fluoroscopy time 20 seconds
(17.80 mGy).

EXAM:
LUMBAR SPINE - 2-3 VIEW; DG C-ARM 1-60 MIN

[Series 1: run · 4 of 4 slices shown]
[im 1/4]
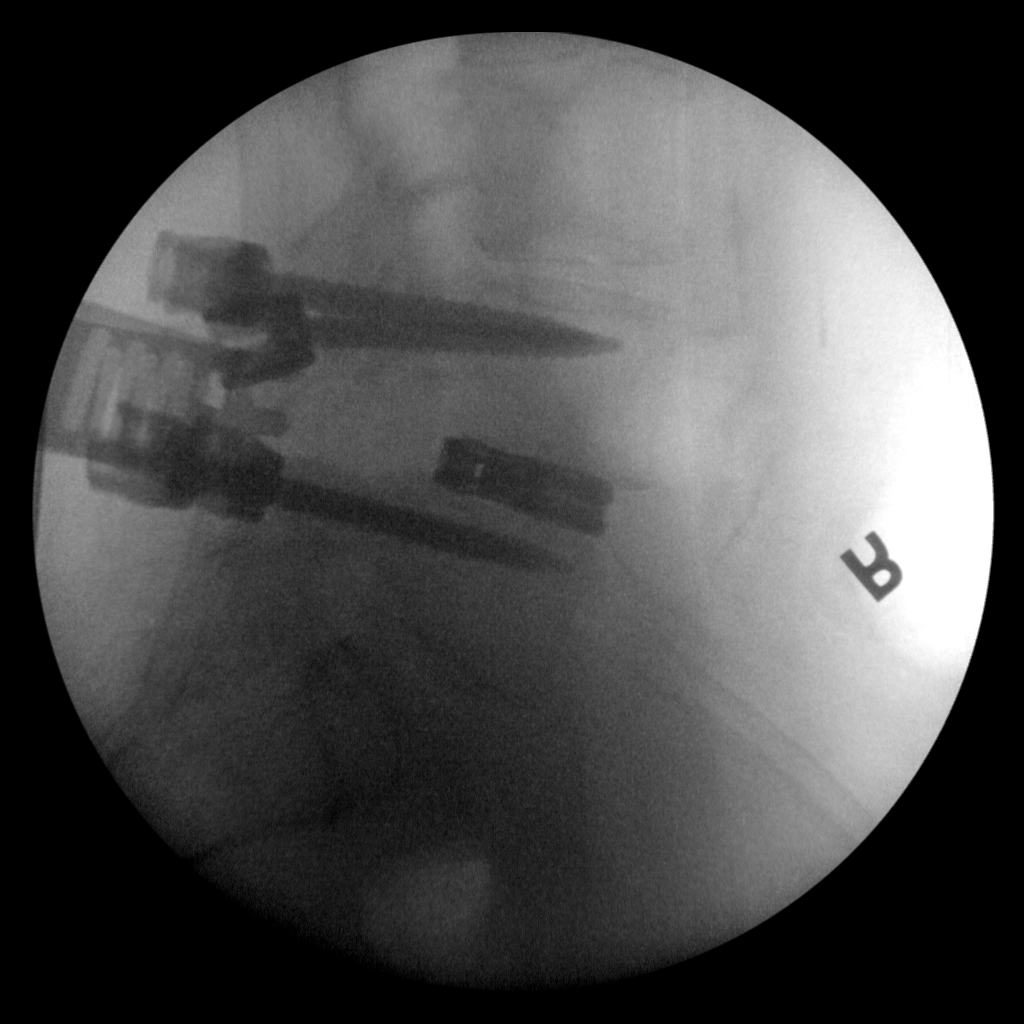
[im 2/4]
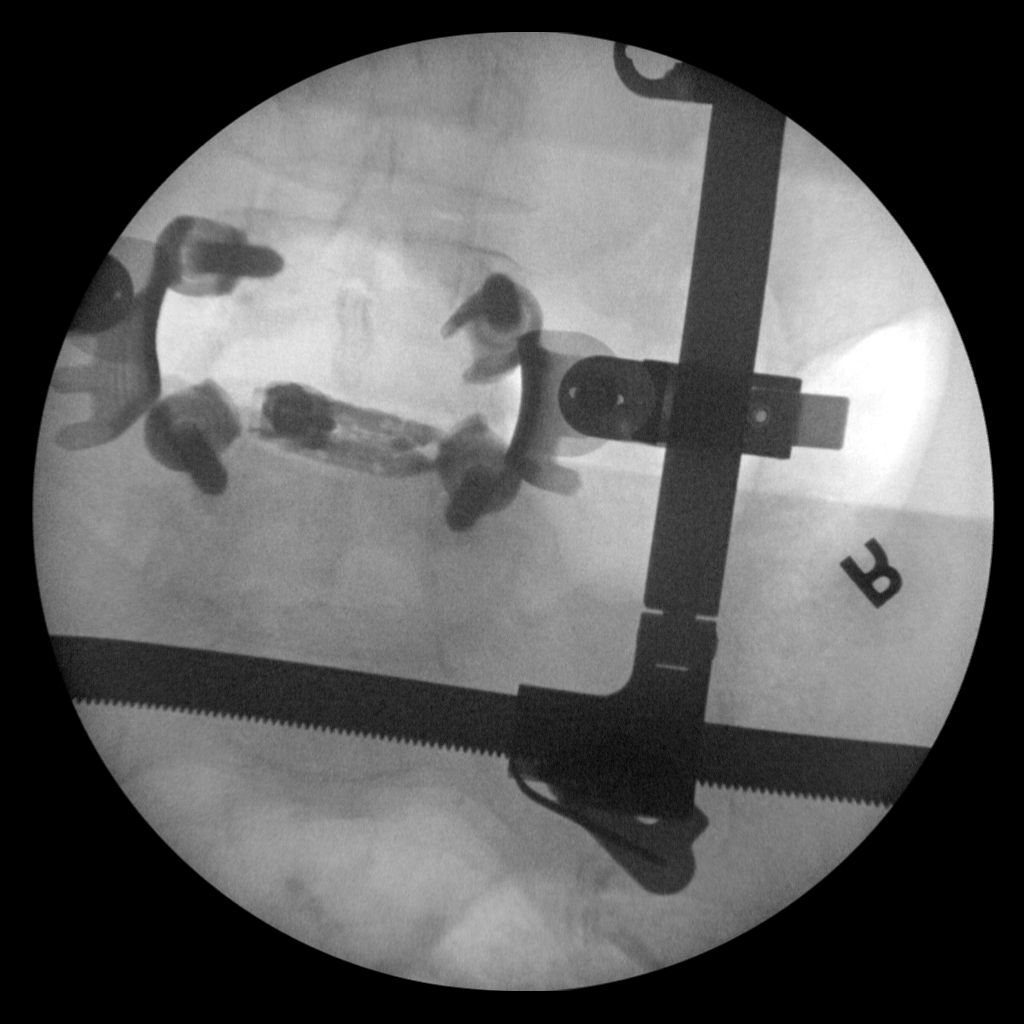
[im 3/4]
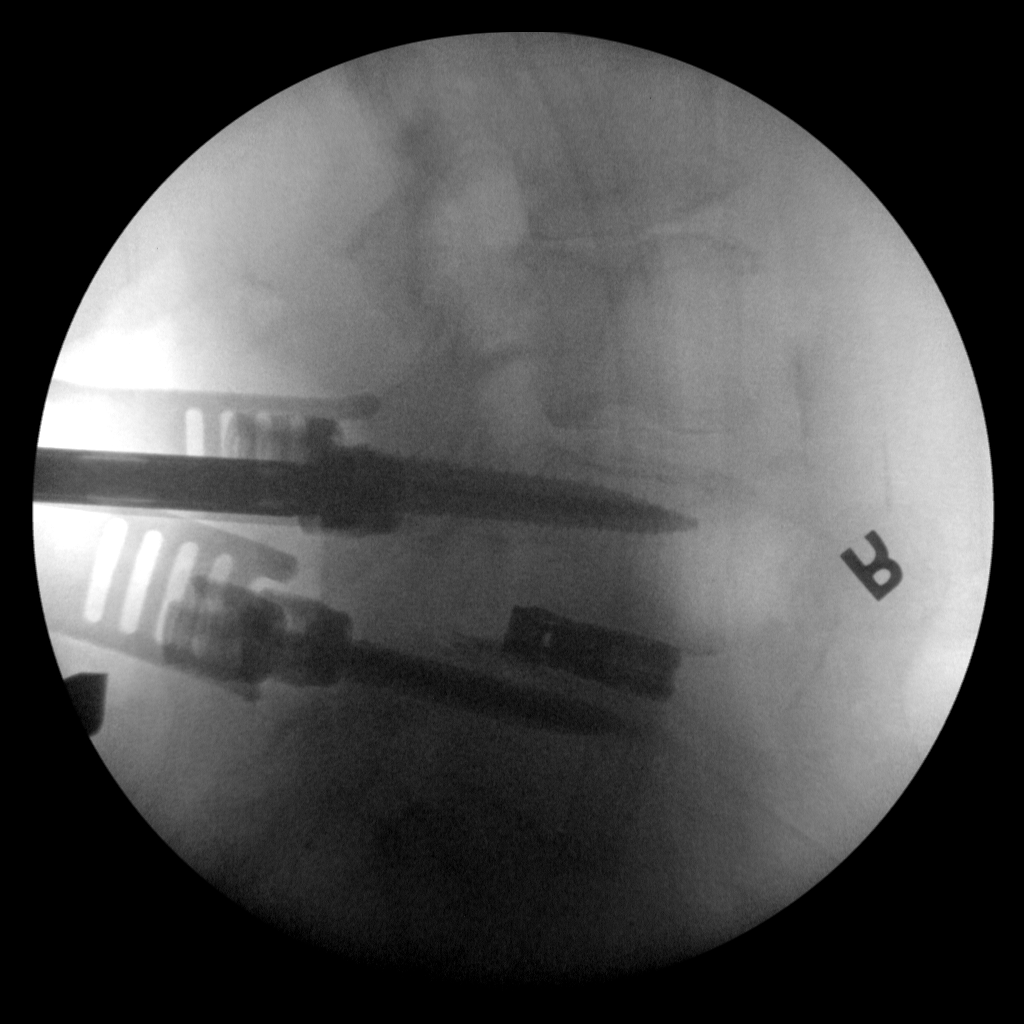
[im 4/4]
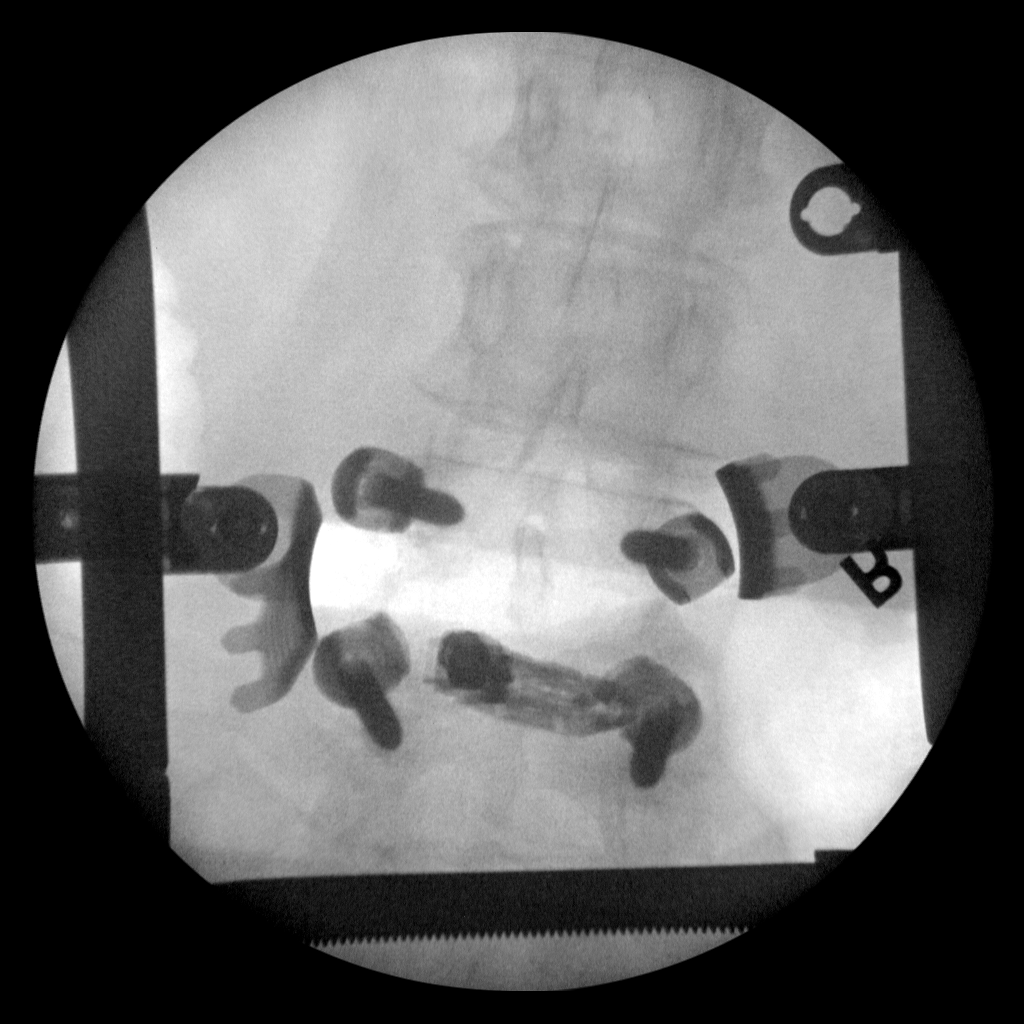

[4 of 4 positions shown; findings below may reference images not displayed]

FINDINGS: AP and lateral view intraoperative fluoroscopic images of the lumbar
spine are submitted, 4 images total. The images demonstrate
bilateral pedicle screws at L4 and L5. Vertical interconnecting rods
were not present at the time the images were taken. An L4-L5
interbody device is also present. Overlying retractors.
IMPRESSION: Four intraoperative fluoroscopic images of the lumbar spine from
L4-L5 fusion, as described.

## 2021-08-02 NOTE — Telephone Encounter (Signed)
[facility] faxed refill request for the following medications:  potassium chloride SA (KLOR-CON) 20 MEQ tablet   Please advise.

## 2021-08-05 MED ORDER — POTASSIUM CHLORIDE CRYS ER 20 MEQ PO TBCR: 20.0000 meq | EXTENDED_RELEASE_TABLET | Freq: Every day | ORAL | 1 refills | Status: DC

## 2021-08-05 NOTE — Telephone Encounter (Signed)
Yes. It should be half (10 mEq daily). She also still needs to come get that repeat potassium drawn

## 2021-08-06 DIAGNOSIS — E1159 Type 2 diabetes mellitus with other circulatory complications: Secondary | ICD-10-CM | POA: Diagnosis not present

## 2021-08-06 DIAGNOSIS — I152 Hypertension secondary to endocrine disorders: Secondary | ICD-10-CM | POA: Diagnosis not present

## 2021-08-07 LAB — BASIC METABOLIC PANEL
BUN/Creatinine Ratio: 15 (ref 12–28)
BUN: 11 mg/dL (ref 8–27)
CO2: 24 mmol/L (ref 20–29)
Calcium: 9.2 mg/dL (ref 8.7–10.3)
Chloride: 92 mmol/L — ABNORMAL LOW (ref 96–106)
Creatinine, Ser: 0.74 mg/dL (ref 0.57–1.00)
Glucose: 192 mg/dL — ABNORMAL HIGH (ref 65–99)
Potassium: 5.3 mmol/L — ABNORMAL HIGH (ref 3.5–5.2)
Sodium: 130 mmol/L — ABNORMAL LOW (ref 134–144)
eGFR: 88 mL/min/{1.73_m2} (ref >59)

## 2021-08-08 ENCOUNTER — Telehealth: Payer: Self-pay

## 2021-08-08 DIAGNOSIS — E1159 Type 2 diabetes mellitus with other circulatory complications: Secondary | ICD-10-CM

## 2021-08-08 NOTE — Telephone Encounter (Signed)
-----   Message from Virginia Crews, MD sent at 08/08/2021  8:00 AM EDT ----- Potassium remains slightly elevated. Hold potassium supplement and recheck in 1 month.

## 2021-08-17 ENCOUNTER — Other Ambulatory Visit: Payer: Self-pay | Admitting: Physician Assistant

## 2021-08-17 DIAGNOSIS — F411 Generalized anxiety disorder: Secondary | ICD-10-CM

## 2021-08-17 DIAGNOSIS — Z794 Long term (current) use of insulin: Secondary | ICD-10-CM

## 2021-08-17 DIAGNOSIS — E1140 Type 2 diabetes mellitus with diabetic neuropathy, unspecified: Secondary | ICD-10-CM

## 2021-08-19 ENCOUNTER — Other Ambulatory Visit: Payer: Self-pay | Admitting: *Deleted

## 2021-08-19 ENCOUNTER — Ambulatory Visit: Payer: Self-pay | Admitting: *Deleted

## 2021-08-19 DIAGNOSIS — Z794 Long term (current) use of insulin: Secondary | ICD-10-CM

## 2021-08-19 DIAGNOSIS — E1140 Type 2 diabetes mellitus with diabetic neuropathy, unspecified: Secondary | ICD-10-CM

## 2021-08-19 NOTE — Telephone Encounter (Signed)
See previous encounter and documentation. Documentation not completed due to systems issues .   Reason for Disposition  Swollen ankle joint  (Exception: area of localized swelling which is itchy)  Answer Assessment - Initial Assessment Questions 1. LOCATION: "Which ankle is swollen?" "Where is the swelling?"     Both ankles left ankle more swollen than right ankle  2. ONSET: "When did the swelling start?"     4 years ago but more so  3. SIZE: "How large is the swelling?"     Not overlaping shoe  4. PAIN: "Is there any pain?" If Yes, ask: "How bad is it?" (Scale 1-10; or mild, moderate, severe)   - NONE (0): no pain.   - MILD (1-3): doesn't interfere with normal activities.    - MODERATE (4-7): interferes with normal activities (e.g., work or school) or awakens from sleep, limping.    - SEVERE (8-10): excruciating pain, unable to do any normal activities, unable to walk.      Mild 3/10  5. CAUSE: "What do you think caused the ankle swelling?"     Not sure  6. OTHER SYMPTOMS: "Do you have any other symptoms?" (e.g., fever, chest pain, difficulty breathing, calf pain)     No  7. PREGNANCY: "Is there any chance you are pregnant?" "When was your last menstrual period?"     na  Protocols used: Ankle Swelling-A-AH

## 2021-08-19 NOTE — Telephone Encounter (Signed)
Requested medication (s) are due for refill today: yes  Requested medication (s) are on the active medication list: yes  Last refill:  02/11/21 #90 1 refills  Future visit scheduled: no  Notes to clinic:  not delegated per protocol, 2 nd request per patient      Requested Prescriptions  Pending Prescriptions Disp Refills   pregabalin (LYRICA) 225 MG capsule 90 capsule 1    Sig: TAKE 1 CAPSULE(225 MG) BY MOUTH DAILY     Not Delegated - Neurology:  Anticonvulsants - Controlled Failed - 08/19/2021  3:59 PM      Failed - This refill cannot be delegated      Passed - Valid encounter within last 12 months    Recent Outpatient Visits           2 months ago 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 [facility])   [facility], Dionne Bucy, MD   6 months ago 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

## 2021-08-19 NOTE — Telephone Encounter (Signed)
C/o bilateral ankle swelling worsening and left ankle greater than right. Patient  reports she is seen for this issue and has a hx of ankle swelling but noted increased swelling today. Denies itching , denies chest pain, difficulty breathing, no fever. Can walk . Pain 3/10 in left ankle . No appt available until 10/31/21 with PCP. Please advise . Patient also requested lyrica to be refilled. Will send request for refill. Care advise given. Patient verbalized understanding of care advise and to call back or go to [facility] or ED if symptoms worsen.  Reason for Disposition  Swollen ankle joint  (Exception: area of localized swelling which is itchy)  Answer Assessment - Initial Assessment Questions 1. LOCATION: "Which ankle is swollen?" "Where is the swelling?"     Both ankles left ankle more swollen than right ankle  2. ONSET: "When did the swelling start?"     4 years ago but more so  3. SIZE: "How large is the swelling?"     Not overlaping shoe  4. PAIN: "Is there any pain?" If Yes, ask: "How bad is it?" (Scale 1-10; or mild, moderate, severe)   - NONE (0): no pain.   - MILD (1-3): doesn't interfere with normal activities.    - MODERATE (4-7): interferes with normal activities (e.g., work or school) or awakens from sleep, limping.    - SEVERE (8-10): excruciating pain, unable to do any normal activities, unable to walk.      Mild 3/10  5. CAUSE: "What do you think caused the ankle swelling?"     Not sure  6. OTHER SYMPTOMS: "Do you have any other symptoms?" (e.g., fever, chest pain, difficulty breathing, calf pain)     No  7. PREGNANCY: "Is there any chance you are pregnant?" "When was your last menstrual period?"     na  Protocols used: Ankle Swelling-A-AH

## 2021-08-20 MED ORDER — PREGABALIN 225 MG PO CAPS: ORAL_CAPSULE | ORAL | 1 refills | Status: DC

## 2021-08-20 NOTE — Telephone Encounter (Signed)
Recommend appt with new PCP Daneil Dan) within next 2 months to discuss ongoing care. Anxiety was not covered at last visit

## 2021-08-20 NOTE — Telephone Encounter (Signed)
No one has appt until November? She can see any provider for that. She needs to establish with new PCP anyways. [place] and [place] may be able to help if we are truly booked for acutes

## 2021-08-20 NOTE — Telephone Encounter (Signed)
Scheduled for 08/23/21 with elise.

## 2021-08-23 ENCOUNTER — Encounter: Payer: Self-pay | Admitting: Family Medicine

## 2021-08-23 ENCOUNTER — Ambulatory Visit (INDEPENDENT_AMBULATORY_CARE_PROVIDER_SITE_OTHER): Payer: Medicare Other | Admitting: Family Medicine

## 2021-08-23 ENCOUNTER — Ambulatory Visit: Payer: Medicare Other | Admitting: Family Medicine

## 2021-08-23 VITALS — BP 121/62 | HR 78 | Temp 97.6°F | Resp 16 | Wt 189.8 lb

## 2021-08-23 DIAGNOSIS — R0981 Nasal congestion: Secondary | ICD-10-CM | POA: Insufficient documentation

## 2021-08-23 DIAGNOSIS — R0989 Other specified symptoms and signs involving the circulatory and respiratory systems: Secondary | ICD-10-CM | POA: Insufficient documentation

## 2021-08-23 DIAGNOSIS — I872 Venous insufficiency (chronic) (peripheral): Secondary | ICD-10-CM | POA: Insufficient documentation

## 2021-08-23 DIAGNOSIS — I83893 Varicose veins of bilateral lower extremities with other complications: Secondary | ICD-10-CM | POA: Diagnosis not present

## 2021-08-23 MED ORDER — METOLAZONE 5 MG PO TABS: 5.0000 mg | ORAL_TABLET | Freq: Every day | ORAL | 0 refills | Status: DC | PRN

## 2021-08-23 MED ORDER — FLUTICASONE PROPIONATE 50 MCG/ACT NA SUSP: 2.0000 | Freq: Every day | NASAL | 6 refills | Status: DC

## 2021-08-23 NOTE — Assessment & Plan Note (Signed)
Small apparent vessels in bilateral ankles and calves Has undergone some vein phlebotomy and stripping Unable to access smaller vessels last she attempted- 18 month prior Referral back to vein center

## 2021-08-23 NOTE — Progress Notes (Signed)
Established patient visit   Patient: Karla Patton   DOB: 1951/05/31   70 y.o. Female  MRN: [id number] Visit Date: 08/23/2021  Today's healthcare provider: Gwyneth Sprout, FNP   Chief Complaint  Patient presents with   Edema   Subjective  -------------------------------------------------------------------------------------------------------------------- HPI HPI   Patient presents in office today with concerns of swelling in both lower legs, patient states that swelling has been present for one month and primarily in her left ankle. Last edited by Minette Headland, CMA on 08/23/2021  1:45 PM.       Medications: Outpatient Medications Prior to Visit  Medication Sig   acetaminophen (TYLENOL) 500 MG tablet Take 1,000 mg by mouth daily as needed.   ALPRAZolam (XANAX) 0.5 MG tablet TAKE 1 TABLET(0.5 MG) BY MOUTH TWICE DAILY   APPLE CIDER VINEGAR PO Take 600 mg by mouth daily.    aspirin EC 81 MG tablet Take 81 mg by mouth daily. Takes at bedtime   beta carotene 25000 UNIT capsule Take 25,000 Units by mouth daily.   Cholecalciferol (VITAMIN D3) 250 MCG (10000 UT) capsule Take 10,000 Units by mouth daily.   Coenzyme Q10 (COQ10) 100 MG CAPS Take 100 mg by mouth daily. With cinnamon   CONTOUR NEXT TEST test strip CHECK BLOOD SUGAR BEFORE MEALS AND AT BEDTIME   Ferrous Sulfate Dried (HIGH POTENCY IRON) 65 MG TABS Take 1 tablet by mouth daily.   furosemide (LASIX) 20 MG tablet TAKE 1 TABLET(20 MG) BY MOUTH DAILY (Patient taking differently: Take 20 mg by mouth daily. TAKE 1 TABLET(20 MG) BY MOUTH DAILY)   insulin aspart (NOVOLOG FLEXPEN) 100 UNIT/ML FlexPen Inject 5-10 Units into the skin 3 (three) times daily with meals. Per sliding scale   insulin glargine (LANTUS SOLOSTAR) 100 UNIT/ML Solostar Pen Inject 20 Units into the skin daily with breakfast.   Insulin Pen Needle 31G X 8 MM MISC Use to inject insulin 4-6 times daily as prescribed   levothyroxine (SYNTHROID) 100 MCG tablet  TAKE 1 TABLET BY MOUTH DAILY BEFORE BREAKFAST (Patient taking differently: Take 100 mcg by mouth daily before breakfast. TAKE 1 TABLET BY MOUTH DAILY BEFORE BREAKFAST)   MAGNESIUM-ZINC PO Take 1 tablet by mouth daily.   Multiple Vitamins-Minerals (ABC PLUS SENIOR ADULTS 50+ PO) Take 1 tablet by mouth daily.   omeprazole (PRILOSEC) 40 MG capsule TAKE 1 CAPSULE(40 MG) BY MOUTH DAILY   potassium chloride SA (KLOR-CON) 20 MEQ tablet Take 1 tablet (20 mEq total) by mouth daily. TAKE 1/2 TABLET (10 meq) BY MOUTH EVERY DAY. TAKE WITH FUROSEMIDE WHEN NEEDED AS DIRECTED   pregabalin (LYRICA) 225 MG capsule TAKE 1 CAPSULE(225 MG) BY MOUTH DAILY   pregabalin (LYRICA) 225 MG capsule TAKE 1 CAPSULE(225 MG) BY MOUTH DAILY   rosuvastatin (CRESTOR) 5 MG tablet Take 1 tablet (5 mg total) by mouth daily.   [DISCONTINUED] cyclobenzaprine (FLEXERIL) 10 MG tablet Take 1 tablet (10 mg total) by mouth 3 (three) times daily as needed for muscle spasms.   No facility-administered medications prior to visit.    Review of Systems     Objective  -------------------------------------------------------------------------------------------------------------------- BP 121/62   Pulse 78   Temp 97.6 F (36.4 C) (Oral)   Resp 16   Wt 189 lb 12.8 oz (86.1 kg)   SpO2 99%   BMI 31.58 kg/m  {Show previous vital signs (optional):23777}  Physical Exam Vitals and nursing note reviewed.  Constitutional:      General: She is not  in acute distress.    Appearance: Normal appearance. She is obese. She is not ill-appearing, toxic-appearing or diaphoretic.  HENT:     Head: Normocephalic and atraumatic.  Neck:     Vascular: JVD present.  Cardiovascular:     Rate and Rhythm: Normal rate and regular rhythm.     Pulses: Normal pulses.          Dorsalis pedis pulses are 2+ on the right side and 2+ on the left side.       Posterior tibial pulses are 2+ on the right side and 2+ on the left side.     Heart sounds: Normal heart  sounds. No murmur heard.   No friction rub. No gallop.  Pulmonary:     Effort: Pulmonary effort is normal. No respiratory distress.     Breath sounds: Normal breath sounds. No stridor. No wheezing, rhonchi or rales.  Chest:     Chest wall: No tenderness.  Abdominal:     General: Bowel sounds are normal.     Palpations: Abdomen is soft.  Musculoskeletal:        General: No swelling, tenderness, deformity or signs of injury. Normal range of motion.     Right lower leg: 2+ Edema present.     Left lower leg: 3+ Edema present.  Skin:    General: Skin is warm and dry.     Capillary Refill: Capillary refill takes less than 2 seconds.     Coloration: Skin is not jaundiced or pale.     Findings: No bruising, erythema, lesion or rash.  Neurological:     General: No focal deficit present.     Mental Status: She is alert and oriented to person, place, and time. Mental status is at baseline.     Cranial Nerves: No cranial nerve deficit.     Sensory: No sensory deficit.     Motor: No weakness.     Coordination: Coordination normal.  Psychiatric:        Mood and Affect: Mood normal.        Behavior: Behavior normal.        Thought Content: Thought content normal.        Judgment: Judgment normal.     No results found for any visits on 08/23/21.  Assessment & Plan  --------------------------------------------------------------------------------------------------------------------- Problem List Items Addressed This Visit       Cardiovascular and Mediastinum   Varicose veins of lower extremity with edema, bilateral - Primary    Chronic concern Worse in L than R Advised TED hose Advised elevation Advised low salt diet Advised loose fitting shoes PRN diuretic dosing Referral back to [place]      Relevant Medications   metolazone (ZAROXOLYN) 5 MG tablet   Other Relevant Orders   Ambulatory referral to Vascular Surgery   Venous insufficiency of both lower extremities    Small  apparent vessels in bilateral ankles and calves Has undergone some vein phlebotomy and stripping Unable to access smaller vessels last she attempted- 18 month prior Referral back to vein center      Relevant Medications   metolazone (ZAROXOLYN) 5 MG tablet     Other   Nasal congestion    Coughs x3 then sneeze Discussed environmental allergens Recommend PO anti allergy medication as well as intra nasal steroid Pt taking zyrtec per report- rx added for flonase      Relevant Medications   fluticasone (FLONASE) 50 MCG/ACT nasal spray   JVD (jugular venous distension)  2-3+ R neck from clavicle Increase diuretic Advised daily weights Advised low salt diet Advised water intake 64 oz/day        Return if symptoms worsen or fail to improve.      Vonna Kotyk, FNP, have reviewed all documentation for this visit. The documentation on 08/23/21 for the exam, diagnosis, procedures, and orders are all accurate and complete.    Gwyneth Sprout, [place] [phone] (phone) [phone] (fax)  [place]

## 2021-08-23 NOTE — Assessment & Plan Note (Signed)
2-3+ R neck from clavicle Increase diuretic Advised daily weights Advised low salt diet Advised water intake 64 oz/day

## 2021-08-23 NOTE — Assessment & Plan Note (Signed)
Chronic concern Worse in L than R Advised TED hose Advised elevation Advised low salt diet Advised loose fitting shoes PRN diuretic dosing Referral back to [place]

## 2021-08-23 NOTE — Assessment & Plan Note (Signed)
Coughs x3 then sneeze Discussed environmental allergens Recommend PO anti allergy medication as well as intra nasal steroid Pt taking zyrtec per report- rx added for flonase

## 2021-08-29 ENCOUNTER — Telehealth: Payer: Self-pay

## 2021-08-29 NOTE — Progress Notes (Signed)
APPOINTMENT REMINDER  Karla Patton was reminded to have all medications, supplements and any blood glucose and blood pressure readings available for review with [name]  Pharm. D, at her telephone visit on 08/30/2021 at 10:00 am .   Questions: Have you had any recent office visit or specialist visit outside of [place]? No Are there any concerns you would like to discuss during your office visit?  Patient states she is unsure if she is having a side effect from her medication.Patient reports she is having symptoms of headaches,no appetite,vomiting on and off since Tuesday, and feels a little dehydrated.Patient denies diarrhea.  Patient reports passing out in the shower today.Patient denies hitting her head, but reports twisting her ankle.  Patient states her blood pressure was 95/51 on 08/29/2021 around 2:45 am.I ask patient if she could check her blood pressure before bed, and in the morning before her appointment.Patient agreed.  Patient states her blood sugar was 290 and 263 on 08/29/2021. Patient states her provider started her on some new medications metolazone, and Flonase nasal spray on 08/23/2021.Patient states she started taking metolazone on Sunday but stop taking it on Tuesday due to the way she was feeling.  Patient states her son is with her at this time.Informed patient if symptoms got worse advised patient to seek additional treatment. Patient verbalized understanding.      [place] Pharmacist Assistant [phone]

## 2021-08-30 ENCOUNTER — Ambulatory Visit (INDEPENDENT_AMBULATORY_CARE_PROVIDER_SITE_OTHER): Payer: Medicare Other

## 2021-08-30 DIAGNOSIS — E1069 Type 1 diabetes mellitus with other specified complication: Secondary | ICD-10-CM

## 2021-08-30 DIAGNOSIS — E785 Hyperlipidemia, unspecified: Secondary | ICD-10-CM

## 2021-08-30 DIAGNOSIS — E103293 Type 1 diabetes mellitus with mild nonproliferative diabetic retinopathy without macular edema, bilateral: Secondary | ICD-10-CM

## 2021-08-30 NOTE — Progress Notes (Signed)
Chronic Care Management Pharmacy Note  09/20/2021 Name:  Karla Patton MRN:  [id number] DOB:  14-Mar-1951   Summary: Patient has been using the Dexcom G6 sensors and has felt this has significantly helped her manage her diabetes. She has has a few instances of hypoglycemia, but sensor report indicates overall hypoglycemia time is at goal  Recommendations/Changes made from today's visit: Continue Current Medications  Plan: CPP 3 month follow-up  Subjective: Karla Patton is an 70 y.o. year old female who is a primary patient of Bacigalupo, Dionne Bucy, MD.  The CCM team was consulted for assistance with disease management and care coordination needs.    Engaged with patient by telephone for follow up visit in response to provider referral for pharmacy case management and/or care coordination services.   Consent to Services:  The patient was given information about Chronic Care Management services, agreed to services, and gave verbal consent prior to initiation of services.  Please see initial visit note for detailed documentation.   Patient Care Team: Virginia Crews, MD as PCP - General (Family Medicine) Dasher, Rayvon Char, MD (Dermatology) Kem Parkinson, MD (Ophthalmology) Bary Castilla Forest Gleason, MD as Consulting Physician (General Surgery) Anabel Bene, MD as Referring Physician (Neurology) Germaine Pomfret, [facility] (Pharmacist) Neldon Labella, RN as Case Manager  Recent office visits: 08/23/21: Patient presented to Tally Joe, FNP for follow-up. Metolazone 5 mg daily PRN for edema. Flonase nasal spray. Patient referred to vein center.  06/13/21: Patient presented to Dr. Brita Romp for follow-up. A1c 7.6%. Rosuvastatin 5 mg daily.  01/28/2021: Patient presented to [name], PA-C for follow-up. A1c 6.8%. LDL 142. Augmentin, CoQ10, melatonin stopped.   Recent consult visits: None in previous 6 months  Hospital visits: 03/18/21: Patient hospitalized for elective  Lumbar surgery   Objective:  Lab Results  Component Value Date   CREATININE 0.69 09/04/2021   BUN 13 09/04/2021   GFR 91.51 03/01/2015   GFRNONAA >60 03/19/2021   GFRAA 95 01/28/2021   NA 134 09/04/2021   K 3.9 09/04/2021   CALCIUM 9.5 09/04/2021   CO2 29 09/04/2021    Lab Results  Component Value Date/Time   HGBA1C 7.6 (A) 06/13/2021 03:32 PM   HGBA1C 7.9 (H) 03/18/2021 09:37 AM   HGBA1C 6.8 (A) 01/28/2021 10:43 AM   HGBA1C 7.3 (H) 01/20/2020 11:31 AM   GFR 91.51 03/01/2015 09:55 AM   GFR 90.01 02/26/2015 02:20 PM   MICROALBUR negative 07/23/2020 10:03 AM   MICROALBUR neg 03/07/2019 11:58 AM    Last diabetic Eye exam:  Lab Results  Component Value Date/Time   HMDIABEYEEXA No Retinopathy 08/20/2020 12:00 AM    Last diabetic Foot exam:  Lab Results  Component Value Date/Time   HMDIABFOOTEX normal 10/10/2014 12:00 AM     Lab Results  Component Value Date   CHOL 220 (H) 01/28/2021   HDL 57 01/28/2021   LDLCALC 142 (H) 01/28/2021   TRIG 116 01/28/2021   CHOLHDL 3.4 01/20/2020    Hepatic Function Latest Ref Rng & Units 06/13/2021 01/28/2021 01/20/2020  Total Protein 6.0 - 8.5 g/dL 6.9 7.2 7.5  Albumin 3.8 - 4.8 g/dL 4.3 4.6 4.6  AST 0 - 40 IU/L 18 24 20   ALT 0 - 32 IU/L 17 18 20   Alk Phosphatase 44 - 121 IU/L 117 101 124(H)  Total Bilirubin 0.0 - 1.2 mg/dL 0.3 0.3 0.3    Lab Results  Component Value Date/Time   TSH 0.988 06/13/2021 04:18 PM   TSH  2.380 01/28/2021 11:29 AM    CBC Latest Ref Rng & Units 06/13/2021 03/19/2021 03/18/2021  WBC 3.4 - 10.8 x10E3/uL 7.4 9.8 -  Hemoglobin 11.1 - 15.9 g/dL 11.3 9.3(L) 10.9(L)  Hematocrit 34.0 - 46.6 % 34.5 28.1(L) 32.0(L)  Platelets 150 - 450 x10E3/uL 440 327 -    Lab Results  Component Value Date/Time   VD25OH 29.3 (L) 12/06/2018 09:50 AM   VD25OH 37.7 05/29/2017 10:46 AM    Clinical ASCVD: No  The 10-year ASCVD risk score (Arnett DK, et al., 2019) is: 18.2%   Values used to calculate the score:     Age: 70  years     Sex: Female     Is Non-Hispanic African American: No     Diabetic: Yes     Tobacco smoker: No     Systolic Blood Pressure: 117 mmHg     Is BP treated: Yes     HDL Cholesterol: 57 mg/dL     Total Cholesterol: 220 mg/dL    Depression screen [facility] 2/9 02/21/2021 01/28/2021 10/19/2020  Decreased Interest 0 0 0  Down, Depressed, Hopeless 0 0 0  PHQ - 2 Score 0 0 0  Altered sleeping - 1 1  Tired, decreased energy - 0 1  Change in appetite - 0 1  Feeling bad or failure about yourself  - 0 0  Trouble concentrating - 0 0  Moving slowly or fidgety/restless - 0 0  Suicidal thoughts - 0 0  PHQ-9 Score - 1 3  Difficult doing work/chores - Not difficult at all Not difficult at all      Social History   Tobacco Use  Smoking Status Never  Smokeless Tobacco Never   BP Readings from Last 3 Encounters:  09/04/21 124/75  08/23/21 121/62  06/13/21 128/76   Pulse Readings from Last 3 Encounters:  09/04/21 68  08/23/21 78  06/13/21 97   Wt Readings from Last 3 Encounters:  08/23/21 189 lb 12.8 oz (86.1 kg)  06/13/21 187 lb 3.2 oz (84.9 kg)  03/18/21 194 lb (88 kg)   Last DEXA Scan: 01/13/18   T-Score femoral neck: -2.0  T-Score total hip: NA  T-Score lumbar spine: -1.5  T-Score forearm radius: NA  10-year probability of major osteoporotic fracture: 10.3%  10-year probability of hip fracture: 1.5%   Assessment/Interventions: Review of patient past medical history, allergies, medications, health status, including review of consultants reports, laboratory and other test data, was performed as part of comprehensive evaluation and provision of chronic care management services.   SDOH:  (Social Determinants of Health) assessments and interventions performed: Yes SDOH Interventions    Flowsheet Row Most Recent Value  SDOH Interventions   Financial Strain Interventions Intervention Not Indicated         CCM Care Plan  No Known Allergies  Medications Reviewed Today      Reviewed by Neldon Labella, RN (Registered Nurse) on 09/06/21 at 1433  Med List Status: <None>   Medication Order Taking? Sig Documenting Provider Last Dose Status Informant  acetaminophen (TYLENOL) 500 MG tablet [id number] No Take 1,000 mg by mouth daily as needed. [provider] Taking Active Self  ALPRAZolam Duanne Moron) 0.5 MG tablet [id number] No TAKE 1 TABLET(0.5 MG) BY MOUTH TWICE DAILY Bacigalupo, Dionne Bucy, MD Taking Active   APPLE CIDER VINEGAR PO [id number] No Take 600 mg by mouth daily.  [provider] Taking Active Self  aspirin EC 81 MG tablet [id number] No Take 81 mg by mouth daily.  Takes at bedtime [provider] Taking Active Self  beta carotene 25000 UNIT capsule [id number] No Take 25,000 Units by mouth daily. [provider] Taking Active Self  Cholecalciferol (VITAMIN D3) 250 MCG (10000 UT) capsule [id number] No Take 10,000 Units by mouth daily. [provider] Taking Active Self  Coenzyme Q10 (COQ10) 100 MG CAPS [id number] No Take 100 mg by mouth daily. With cinnamon [provider] Taking Active Self  CONTOUR NEXT TEST test strip [id number] No CHECK BLOOD SUGAR BEFORE MEALS AND AT BEDTIME Mar Daring, PA-C Taking Active Self  Ferrous Sulfate Dried (HIGH POTENCY IRON) 65 MG TABS [id number] No Take 1 tablet by mouth daily. [provider] Taking Active Self  fluticasone (FLONASE) 50 MCG/ACT nasal spray [id number] No Place 2 sprays into both nostrils daily. Gwyneth Sprout, FNP Taking Active   furosemide (LASIX) 20 MG tablet [id number] No TAKE 1 TABLET(20 MG) BY MOUTH DAILY  Patient taking differently: Take 20 mg by mouth daily. TAKE 1 TABLET(20 MG) BY MOUTH DAILY   Mar Daring, PA-C Taking Active   insulin aspart (NOVOLOG FLEXPEN) 100 UNIT/ML FlexPen [id number] No Inject 5-10 Units into the skin 3 (three) times daily with meals. Per sliding scale [provider] Taking Active Self  insulin glargine (LANTUS  SOLOSTAR) 100 UNIT/ML Solostar Pen [id number] No Inject 20 Units into the skin daily with breakfast. Virginia Crews, MD Taking Active   Insulin Pen Needle 31G X 8 MM MISC [id number] No Use to inject insulin 4-6 times daily as prescribed Virginia Crews, MD Taking Active   levothyroxine (SYNTHROID) 100 MCG tablet [id number] No TAKE 1 TABLET BY MOUTH DAILY BEFORE BREAKFAST  Patient taking differently: Take 100 mcg by mouth daily before breakfast. TAKE 1 TABLET BY MOUTH DAILY BEFORE BREAKFAST   [place] Taking Active   MAGNESIUM-ZINC PO [id number] No Take 1 tablet by mouth daily. [provider] Taking Active Self  Multiple Vitamins-Minerals (ABC PLUS SENIOR ADULTS 50+ PO) [id number] No Take 1 tablet by mouth daily. [provider] Taking Active Self           Med Note Michaelle Birks, Olivya Sobol A   Tue Feb 26, 2021  1:15 PM) Centrum Silver   omeprazole (PRILOSEC) 40 MG capsule [id number] No TAKE 1 CAPSULE(40 MG) BY MOUTH DAILY Bacigalupo, Dionne Bucy, MD Taking Active   pregabalin (LYRICA) 225 MG capsule [id number] No TAKE 1 CAPSULE(225 MG) BY MOUTH DAILY Bacigalupo, Dionne Bucy, MD Taking Active   rosuvastatin (CRESTOR) 5 MG tablet [id number] No Take 1 tablet (5 mg total) by mouth daily. Virginia Crews, MD Taking Active             Patient Active Problem List   Diagnosis Date Noted   Varicose veins of lower extremity with edema, bilateral 08/23/2021   Nasal congestion 08/23/2021   JVD (jugular venous distension) 08/23/2021   Venous insufficiency of both lower extremities 08/23/2021   Hyperlipidemia due to type 1 diabetes mellitus ([place]) 06/13/2021   Anemia 06/13/2021   Class 1 obesity with serious comorbidity and body mass index (BMI) of 31.0 to 31.9 in adult 06/13/2021   Spondylolisthesis of lumbar region 03/19/2021   Spondylolisthesis, lumbar region 03/18/2021   Lymphedema 10/30/2019   Type 1 diabetes mellitus with diabetic nephropathy ([place]) 09/01/2018    Type 1 diabetes mellitus with polyneuropathy ([place]) 09/01/2018   Hypertension associated with diabetes ([place]) 03/03/2018   Precordial pain 03/03/2018   Headache disorder 02/10/2018   Osteopenia 01/13/2018  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 (HCC) 08/28/2017   Hepatic cyst 08/28/2017   Tinnitus of right ear 08/28/2017   Hearing loss of right ear 08/28/2017   Constipation 08/28/2017   Background diabetic retinopathy ([place]) 05/22/2015   Hypothyroidism 10/10/2014   Diabetic hypoglycemia ([place]) 10/10/2014   Varicose veins of lower extremities with other complications 93/81/0175    Immunization History  Administered Date(s) Administered   Influenza Split 09/02/2014   Influenza, High Dose Seasonal PF 09/01/2018   Influenza,inj,Quad PF,6+ Mos 08/28/2017   Influenza-Unspecified 10/17/2016, 09/21/2019   PFIZER(Purple Top)SARS-COV-2 Vaccination 02/01/2020, 02/22/2020   Pneumococcal Conjugate-13 02/01/2014   Pneumococcal Polysaccharide-23 09/02/2014, 10/19/2019   Tdap 11/26/2016   Zoster, Live 02/28/2014    Conditions to be addressed/monitored:  Hypertension, Hyperlipidemia, Diabetes, Hypothyroidism and Osteopenia  Care Plan : General Pharmacy (Adult)  Updates made by Germaine Pomfret, [place] since 09/20/2021 12:00 AM     Problem: Hypertension, Hyperlipidemia, Diabetes, Hypothyroidism and Osteopenia   Priority: High     Long-Range Goal: Patient-Specific Goal   Start Date: 02/26/2021  Expected End Date: 09/20/2022  This Visit's Progress: On track  Recent Progress: On track  Priority: High  Note:   Current Barriers:  Unable to independently afford treatment regimen  Pharmacist Clinical Goal(s):  Over the next 90 days, patient will maintain control of diabetes as evidenced by A1c less than 8%  through collaboration with PharmD and provider.   Interventions: 1:1 collaboration with Mar Daring, PA-C regarding  development and update of comprehensive plan of care as evidenced by provider attestation and co-signature Inter-disciplinary care team collaboration (see longitudinal plan of care) Comprehensive medication review performed; medication list updated in electronic medical record  Hypertension (BP goal <140/90) -Controlled -Current treatment: Furosemide 20 mg daily  -Medications previously tried: Amlodipine, Lisinopril, Torsemide  -Current home readings: NA -Current dietary habits: Patient tries to follow a low-carb, low sodium diet -Current exercise habits: Walking -Denies hypotensive/hypertensive symptoms -Educated on Daily salt intake goal < 2300 mg; Importance of home blood pressure monitoring; -Counseled to monitor BP at home weekly, document, and provide log at future appointments -Recommended to continue current medication  Hyperlipidemia: (LDL goal < 70) -Uncontrolled -Current treatment: Rosuvastatin 5 mg daily  -Medications previously tried: Atorvastatin 10 mg   -Educated on Importance of limiting foods high in cholesterol; -Counseled on diet and exercise extensively  Diabetes (A1c goal <8%) -Controlled -Current medications: Novolog up to 20 units three times daily  1 unit per 15 g carbs 1 unit for every 50 glucose > 200 mg/dL Lantus 20 units with breakfast    -Medications previously tried: NA    Target 5/28-6/10 8/27-9/9  Number of days worn ? 14 days 14 14  % of time active ? 70% 93% 93%  Mean Glucose (mg/dL)  159 173  GMI (2-week A1c estimate)  7.1% 7.4%  Standard Deviation (mg/dL)  53 60  Time above >250 mg/dL <10% 7% 12%  Time above 70-180 mg/dL <50% 23% 27%  Time in range: 70-180 mg/dL >50% 69% 59%  Time below 70 mg/dL <1% <1% 1%  -Reports hypoglycemic symptoms: A few instances of lows overnight/early morning. Felt weak, shaky, sweaty.  -Continue current medications   Osteopenia (Goal Maintain bone density and prevent fractures) -Controlled -Patient is  not a candidate for pharmacologic treatment -Current treatment  Vitamin D 10,000 units   -Medications previously tried: NA  -Recommend [phone] units of vitamin D daily. Recommend 1200 mg of  calcium daily from dietary and supplemental sources. -Recommended to continue current medication  Hypothyroidism (Goal: maintain stable thyroid function) -Controlled -Current treatment  Levothyroxine 100 mcg daily before breakfast  -Medications previously tried: NA  -Recommended to continue current medication  GERD (Goal: Minimize symptoms of heartburn or reflux) -Controlled -Current treatment  Omeprazole 40 mg daily  -Medications previously tried: NA  -Patient reports significant reflux with single missed dose - Counseled on importance of trigger avoidance -Recommended to continue current medication  Patient Goals/Self-Care Activities Over the next 90 days, patient will:  - check glucose at 5-10 times daily , document, and provide at future appointments check blood pressure weekly, document, and provide at future appointments  Follow Up Plan: Telephone follow up appointment with care management team member scheduled for:  11/29/21 at 11:00 AM        Medication Assistance: Application for Novolog, Lantus  medication assistance program. in process.  Anticipated assistance start date 04/20/2021.  See plan of care for additional detail.  Patient's preferred pharmacy is:  [id number] [address], [place] AT [facility] [address] [place] [address] Phone: [phone] Fax: [phone]  Uses pill box? Yes Pt endorses 100% compliance  We discussed: Current pharmacy is preferred with insurance plan and patient is satisfied with pharmacy services Patient decided to: Continue current medication management strategy  Care Plan and Follow Up Patient Decision:  Patient agrees to Care Plan and Follow-up.  Plan: Telephone follow up appointment  with care management team member scheduled for:  11/29/21 at 11:00 AM  [place] [phone]

## 2021-09-04 ENCOUNTER — Encounter: Payer: Self-pay | Admitting: Family Medicine

## 2021-09-04 ENCOUNTER — Other Ambulatory Visit: Payer: Self-pay

## 2021-09-04 ENCOUNTER — Telehealth: Payer: Self-pay

## 2021-09-04 ENCOUNTER — Ambulatory Visit
Admission: RE | Admit: 2021-09-04 | Discharge: 2021-09-04 | Disposition: A | Discharge status: Home / Self Care | Payer: Medicare Other | Attending: Family Medicine | Admitting: Family Medicine

## 2021-09-04 ENCOUNTER — Ambulatory Visit (INDEPENDENT_AMBULATORY_CARE_PROVIDER_SITE_OTHER): Payer: Medicare Other | Admitting: Family Medicine

## 2021-09-04 ENCOUNTER — Ambulatory Visit
Admission: RE | Admit: 2021-09-04 | Discharge: 2021-09-04 | Disposition: A | Discharge status: Home / Self Care | Payer: Medicare Other | Source: Ambulatory Visit | Attending: Family Medicine | Admitting: Family Medicine

## 2021-09-04 VITALS — BP 124/75 | HR 68 | Resp 16

## 2021-09-04 DIAGNOSIS — M25471 Effusion, right ankle: Secondary | ICD-10-CM | POA: Insufficient documentation

## 2021-09-04 DIAGNOSIS — R55 Syncope and collapse: Secondary | ICD-10-CM

## 2021-09-04 DIAGNOSIS — M25571 Pain in right ankle and joints of right foot: Secondary | ICD-10-CM | POA: Insufficient documentation

## 2021-09-04 DIAGNOSIS — S82831A Other fracture of upper and lower end of right fibula, initial encounter for closed fracture: Secondary | ICD-10-CM | POA: Diagnosis not present

## 2021-09-04 DIAGNOSIS — M25579 Pain in unspecified ankle and joints of unspecified foot: Secondary | ICD-10-CM | POA: Insufficient documentation

## 2021-09-04 DIAGNOSIS — E1159 Type 2 diabetes mellitus with other circulatory complications: Secondary | ICD-10-CM | POA: Diagnosis not present

## 2021-09-04 DIAGNOSIS — I152 Hypertension secondary to endocrine disorders: Secondary | ICD-10-CM | POA: Diagnosis not present

## 2021-09-04 DIAGNOSIS — E1042 Type 1 diabetes mellitus with diabetic polyneuropathy: Secondary | ICD-10-CM | POA: Diagnosis not present

## 2021-09-04 DIAGNOSIS — M7989 Other specified soft tissue disorders: Secondary | ICD-10-CM | POA: Diagnosis not present

## 2021-09-04 NOTE — Telephone Encounter (Signed)
Needs to be seen or go to urgent care if no appointments available.

## 2021-09-04 NOTE — Telephone Encounter (Signed)
Please review for Dr. B 

## 2021-09-04 NOTE — Telephone Encounter (Signed)
Copied from [place] [phone]. Topic: General - Other >> Sep 04, 2021 10:30 AM Karla Patton wrote: Reason for CRM: Pt stated she had a fall in the shower and landed on her ankle. Pt would like an order for a x-ray of her ankle. Cb# [phone]

## 2021-09-04 NOTE — Progress Notes (Signed)
I,April Miller,acting as a scribe for Wilhemena Durie, MD.,have documented all relevant documentation on the behalf of Wilhemena Durie, MD,as directed by  Wilhemena Durie, MD while in the presence of Wilhemena Durie, MD.   Established patient visit   Patient: Karla Patton   DOB: 10-17-1951   70 y.o. Female  MRN: [id number] Visit Date: 09/04/2021  Today's healthcare provider: Wilhemena Durie, MD   Chief Complaint  Patient presents with   Fall   Subjective    Fall The accident occurred 5 to 7 days ago (6 days ago). Fall occurred: shower. She fell from a height of 3 to 5 ft. There was no blood loss. The point of impact was the right foot. The pain is present in the right foot, right heel and right lower leg. The pain is at a severity of 6/10. The pain is moderate. The symptoms are aggravated by sitting, movement, extension, ambulation, flexion, pressure on injury, standing and rotation. Associated symptoms include a loss of consciousness. Pertinent negatives include no abdominal pain, bowel incontinence, fever, headaches, hearing loss, hematuria, nausea, numbness, tingling, visual change or vomiting. She has tried elevation [facility] and Elevation) for the symptoms. The treatment provided no relief.     Patient passed out in the shower 6 days ago. Patient came to sitting in the bottom of the bathtub. Patient fell on her right foot. Patient stated her right foot has been bruised and swollen since. Ankle and foot in very painful. Patient has treating pain with Icey Hot and Elevation. Patient does not wish to have the syncope evaluated, just the ankle.  It hurts to bear weight especially above the lateral malleolus of the right ankle.  It is not getting any better.    Medications: Outpatient Medications Prior to Visit  Medication Sig   acetaminophen (TYLENOL) 500 MG tablet Take 1,000 mg by mouth daily as needed.   ALPRAZolam (XANAX) 0.5 MG tablet TAKE 1 TABLET(0.5 MG)  BY MOUTH TWICE DAILY   APPLE CIDER VINEGAR PO Take 600 mg by mouth daily.    aspirin EC 81 MG tablet Take 81 mg by mouth daily. Takes at bedtime   beta carotene 25000 UNIT capsule Take 25,000 Units by mouth daily.   Cholecalciferol (VITAMIN D3) 250 MCG (10000 UT) capsule Take 10,000 Units by mouth daily.   Coenzyme Q10 (COQ10) 100 MG CAPS Take 100 mg by mouth daily. With cinnamon   CONTOUR NEXT TEST test strip CHECK BLOOD SUGAR BEFORE MEALS AND AT BEDTIME   Ferrous Sulfate Dried (HIGH POTENCY IRON) 65 MG TABS Take 1 tablet by mouth daily.   fluticasone (FLONASE) 50 MCG/ACT nasal spray Place 2 sprays into both nostrils daily.   furosemide (LASIX) 20 MG tablet TAKE 1 TABLET(20 MG) BY MOUTH DAILY (Patient taking differently: Take 20 mg by mouth daily. TAKE 1 TABLET(20 MG) BY MOUTH DAILY)   insulin aspart (NOVOLOG FLEXPEN) 100 UNIT/ML FlexPen Inject 5-10 Units into the skin 3 (three) times daily with meals. Per sliding scale   insulin glargine (LANTUS SOLOSTAR) 100 UNIT/ML Solostar Pen Inject 20 Units into the skin daily with breakfast.   Insulin Pen Needle 31G X 8 MM MISC Use to inject insulin 4-6 times daily as prescribed   levothyroxine (SYNTHROID) 100 MCG tablet TAKE 1 TABLET BY MOUTH DAILY BEFORE BREAKFAST (Patient taking differently: Take 100 mcg by mouth daily before breakfast. TAKE 1 TABLET BY MOUTH DAILY BEFORE BREAKFAST)   MAGNESIUM-ZINC PO Take 1  tablet by mouth daily.   Multiple Vitamins-Minerals (ABC PLUS SENIOR ADULTS 50+ PO) Take 1 tablet by mouth daily.   omeprazole (PRILOSEC) 40 MG capsule TAKE 1 CAPSULE(40 MG) BY MOUTH DAILY   pregabalin (LYRICA) 225 MG capsule TAKE 1 CAPSULE(225 MG) BY MOUTH DAILY   rosuvastatin (CRESTOR) 5 MG tablet Take 1 tablet (5 mg total) by mouth daily.   No facility-administered medications prior to visit.    Review of Systems  Constitutional:  Negative for appetite change, chills, fatigue and fever.  Respiratory:  Negative for chest tightness and  shortness of breath.   Cardiovascular:  Negative for chest pain and palpitations.  Gastrointestinal:  Negative for abdominal pain, bowel incontinence, nausea and vomiting.  Genitourinary:  Negative for hematuria.  Neurological:  Positive for loss of consciousness. Negative for dizziness, tingling, weakness, numbness and headaches.      Objective    BP 124/75 (BP Location: Left Arm, Patient Position: Sitting, Cuff Size: Large)   Pulse 68   Resp 16   SpO2 97%  {Show previous vital signs (optional):23777}  Physical Exam Vitals reviewed.  Constitutional:      General: She is not in acute distress.    Appearance: Normal appearance. She is well-developed. She is not diaphoretic.  HENT:     Head: Normocephalic and atraumatic.  Eyes:     General: No scleral icterus.    Conjunctiva/sclera: Conjunctivae normal.  Neck:     Thyroid: No thyromegaly.  Cardiovascular:     Rate and Rhythm: Normal rate and regular rhythm.     Pulses: Normal pulses.     Heart sounds: Murmur heard.  Pulmonary:     Effort: Pulmonary effort is normal. No respiratory distress.     Breath sounds: Normal breath sounds. No wheezing, rhonchi or rales.  Musculoskeletal:     Cervical back: Neck supple.     Right lower leg: No edema.     Left lower leg: No edema.     Comments: She has mild swelling of the entire right ankle but is tender over the distal fibula.  No crepitance and no obvious deformity.  Lymphadenopathy:     Cervical: No cervical adenopathy.  Skin:    General: Skin is warm and dry.     Findings: No rash.  Neurological:     Mental Status: She is alert and oriented to person, place, and time. Mental status is at baseline.  Psychiatric:        Mood and Affect: Mood normal.        Behavior: Behavior normal.        Thought Content: Thought content normal.        Judgment: Judgment normal.      No results found for any visits on 09/04/21.  Assessment & Plan     1. Pain and swelling of right  ankle Suspect she has a distal fibular fracture.  X-ray and refer to Ortho if that is present.  Otherwise she will continue to wear the boot/brace that she has at home. - DG Ankle 2 Views Right - DG Ankle Complete Right - AMB referral to orthopedics  2. Hypertension associated with diabetes ([place])   3. Type 1 diabetes mellitus with polyneuropathy (HCC)   4. Syncope, unspecified syncope type Patient does not wish work-up today.  Neurologically she is intact and her heart rate and rhythm appear to be stable.  The been hypoglycemia also.  She has been a type I diabetic for many years.  No follow-ups on file.      I, Wilhemena Durie, MD, have reviewed all documentation for this visit. The documentation on 09/08/21 for the exam, diagnosis, procedures, and orders are all accurate and complete.    Verle Brillhart Cranford Mon, MD  [facility] [phone] (phone) [phone] (fax)  [place]

## 2021-09-04 NOTE — Telephone Encounter (Signed)
Appointment scheduled today with Dr. Rosanna Randy.

## 2021-09-05 LAB — BASIC METABOLIC PANEL
BUN/Creatinine Ratio: 19 (ref 12–28)
BUN: 13 mg/dL (ref 8–27)
CO2: 29 mmol/L (ref 20–29)
Calcium: 9.5 mg/dL (ref 8.7–10.3)
Chloride: 90 mmol/L — ABNORMAL LOW (ref 96–106)
Creatinine, Ser: 0.69 mg/dL (ref 0.57–1.00)
Glucose: 101 mg/dL — ABNORMAL HIGH (ref 65–99)
Potassium: 3.9 mmol/L (ref 3.5–5.2)
Sodium: 134 mmol/L (ref 134–144)
eGFR: 94 mL/min/{1.73_m2} (ref >59)

## 2021-09-06 ENCOUNTER — Ambulatory Visit: Payer: Medicare Other

## 2021-09-06 DIAGNOSIS — E1059 Type 1 diabetes mellitus with other circulatory complications: Secondary | ICD-10-CM

## 2021-09-06 DIAGNOSIS — Z9181 History of falling: Secondary | ICD-10-CM

## 2021-09-06 DIAGNOSIS — E103293 Type 1 diabetes mellitus with mild nonproliferative diabetic retinopathy without macular edema, bilateral: Secondary | ICD-10-CM

## 2021-09-06 NOTE — Chronic Care Management (AMB) (Signed)
Chronic Care Management   CCM RN Visit Note  09/06/2021 Name: Karla Patton MRN: [id number] DOB: Oct 07, 1951  Subjective: Karla Patton is a 70 y.o. year old female who is a primary care patient of Bacigalupo, Dionne Bucy, MD. The care management team was consulted for assistance with disease management and care coordination needs.    Engaged with patient by telephone for follow up visit in response to provider referral for case management and care coordination services.   Consent to Services:  The patient was given information about Chronic Care Management services, agreed to services, and gave verbal consent prior to initiation of services.  Please see initial visit note for detailed documentation.   Assessment: Review of patient past medical history, allergies, medications, health status, including review of consultants reports, laboratory and other test data, was performed as part of comprehensive evaluation and provision of chronic care management services.   SDOH (Social Determinants of Health) assessments and interventions performed: No    CCM Care Plan  No Known Allergies  Outpatient Encounter Medications as of 09/06/2021  Medication Sig Note   acetaminophen (TYLENOL) 500 MG tablet Take 1,000 mg by mouth daily as needed.    ALPRAZolam (XANAX) 0.5 MG tablet TAKE 1 TABLET(0.5 MG) BY MOUTH TWICE DAILY    APPLE CIDER VINEGAR PO Take 600 mg by mouth daily.     aspirin EC 81 MG tablet Take 81 mg by mouth daily. Takes at bedtime    beta carotene 25000 UNIT capsule Take 25,000 Units by mouth daily.    Cholecalciferol (VITAMIN D3) 250 MCG (10000 UT) capsule Take 10,000 Units by mouth daily.    Coenzyme Q10 (COQ10) 100 MG CAPS Take 100 mg by mouth daily. With cinnamon    CONTOUR NEXT TEST test strip CHECK BLOOD SUGAR BEFORE MEALS AND AT BEDTIME    Ferrous Sulfate Dried (HIGH POTENCY IRON) 65 MG TABS Take 1 tablet by mouth daily.    fluticasone (FLONASE) 50 MCG/ACT nasal spray Place 2  sprays into both nostrils daily.    furosemide (LASIX) 20 MG tablet TAKE 1 TABLET(20 MG) BY MOUTH DAILY (Patient taking differently: Take 20 mg by mouth daily. TAKE 1 TABLET(20 MG) BY MOUTH DAILY)    insulin aspart (NOVOLOG FLEXPEN) 100 UNIT/ML FlexPen Inject 5-10 Units into the skin 3 (three) times daily with meals. Per sliding scale    insulin glargine (LANTUS SOLOSTAR) 100 UNIT/ML Solostar Pen Inject 20 Units into the skin daily with breakfast.    Insulin Pen Needle 31G X 8 MM MISC Use to inject insulin 4-6 times daily as prescribed    levothyroxine (SYNTHROID) 100 MCG tablet TAKE 1 TABLET BY MOUTH DAILY BEFORE BREAKFAST (Patient taking differently: Take 100 mcg by mouth daily before breakfast. TAKE 1 TABLET BY MOUTH DAILY BEFORE BREAKFAST)    MAGNESIUM-ZINC PO Take 1 tablet by mouth daily.    Multiple Vitamins-Minerals (ABC PLUS SENIOR ADULTS 50+ PO) Take 1 tablet by mouth daily. 02/26/2021: Centrum Silver    omeprazole (PRILOSEC) 40 MG capsule TAKE 1 CAPSULE(40 MG) BY MOUTH DAILY    pregabalin (LYRICA) 225 MG capsule TAKE 1 CAPSULE(225 MG) BY MOUTH DAILY    rosuvastatin (CRESTOR) 5 MG tablet Take 1 tablet (5 mg total) by mouth daily.    No facility-administered encounter medications on file as of 09/06/2021.    Patient Active Problem List   Diagnosis Date Noted   Varicose veins of lower extremity with edema, bilateral 08/23/2021   Nasal congestion 08/23/2021  JVD (jugular venous distension) 08/23/2021   Venous insufficiency of both lower extremities 08/23/2021   Hyperlipidemia due to type 1 diabetes mellitus ([place]) 06/13/2021   Anemia 06/13/2021   Class 1 obesity with serious comorbidity and body mass index (BMI) of 31.0 to 31.9 in adult 06/13/2021   Spondylolisthesis of lumbar region 03/19/2021   Spondylolisthesis, lumbar region 03/18/2021   Lymphedema 10/30/2019   Type 1 diabetes mellitus with diabetic nephropathy ([place]) 09/01/2018   Type 1 diabetes mellitus with polyneuropathy ([place])  09/01/2018   Hypertension associated with diabetes ([place]) 03/03/2018   Precordial pain 03/03/2018   Headache disorder 02/10/2018   Osteopenia 01/13/2018   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 ([place]) 08/28/2017   Hepatic cyst 08/28/2017   Tinnitus of right ear 08/28/2017   Hearing loss of right ear 08/28/2017   Constipation 08/28/2017   Background diabetic retinopathy ([place]) 05/22/2015   Hypothyroidism 10/10/2014   Diabetic hypoglycemia ([place]) 10/10/2014   Varicose veins of lower extremities with other complications 18/56/3149    Conditions to be addressed/monitored:HTN, HLD, DM and Fall Risk  Patient Care Plan: Diabetes Type 1 (Adult)     Problem Identified: Glycemic Management (Diabetes, Type 1)      Long-Range Goal: Glycemic Management Optimized   Start Date: 09/06/2021  Expected End Date: 12/05/2021  Priority: High  Note:   Objective:  Lab Results  Component Value Date   HGBA1C 7.6 (A) 06/13/2021    Lab Results  Component Value Date   HGBA1C 7.9 (H) 03/18/2021     Current Barriers:  Chronic Disease Management support and educational needs r/t Diabetes.  Case Manager Clinical Goal(s):  Over the next 90 days, patient will not require emergent care d/t complications r/t hypoglycemia. Over the next 90 days, patient will demonstrate adherence to prescribed treatment plan for Diabetes self management as evidenced by consistently assessing readings on her continuous glucose monitoring device, taking medications as prescribed and adhering to the recommended ADA/carb modified diet.   Interventions:  Collaboration with [name], MD, regarding development and update of comprehensive plan of care as evidenced by provider attestation and co-signature Inter-disciplinary care team collaboration (see longitudinal plan of care) Reviewed compliance with current treatment plan. Reports excellent compliance with  medications and insulin administration.  Monitoring readings Dexcom G6. Reports several fluctuations in readings over the past few weeks.  Reviewed s/sx of hypoglycemia and hyperglycemia. Denies symptoms. Verbalized awareness of appropriate interventions.  Discussed nutritional intake. She is attempting to follow the recommended diet. Activity remains limited d/t recent fall related injury.  Advised to update the care management team if additional diabetes resources are needed.   Patient Goals/Self-Care Activities Self administer oral medications and insulin as prescribed Attend all scheduled provider appointments Adhere to prescribed ADA/carb modified Notify provider or care management team with questions and new concerns as needed   Follow Up Plan:  Will follow up next month     Long-Range Goal: Blood Pressure Monitored   Start Date: 09/06/2021  Expected End Date: 12/05/2021  Priority: High  Note:   Objective:  Last practice recorded BP readings:  BP Readings from Last 3 Encounters:  09/04/21 124/75  08/23/21 121/62  06/13/21 128/76   Most recent eGFR/CrCl:  Lab Results  Component Value Date   EGFR 94 09/04/2021    No components found for: CRCL  Current Barriers:  Chronic Disease Management support and educational needs related to BP management.  Case Manager Clinical Goal(s):  Over  the next 90 days, patient will demonstrate adherence to prescribed treatment plan for BP management as evidenced by taking all medications as prescribed, monitoring and recording blood pressure, and adhering to a cardiac prudent/heart healthy diet.  Interventions:  Collaboration with Brita Romp Dionne Bucy, MD regarding development and update of comprehensive plan of care as evidenced by provider attestation and co-signature Inter-disciplinary care team collaboration (see longitudinal plan of care) Reviewed medications and importance of compliance. Reports taking medications as prescribed and  discontinuing metolazone as instructed. Provided information regarding established blood pressure parameters. Advised to notify a provider for low or elevated readings. Encouraged to monitor and record readings Discussed compliance with recommended cardiac prudent diet. Encouraged to read nutrition labels and avoid highly processed foods when possible. Reviewed s/sx of heart attack, stroke and worsening symptoms that require immediate medical attention.    Patient Goals/Self-Care Activities: Self-administer medications as prescribed. Monitor and record blood pressure Adhere to recommended cardiac prudent/heart healthy diet Notify provider or care management team with questions and new concerns as needed    Follow Up Plan:  Will follow up next month     Patient Care Plan: Fall Risk (Adult)     Problem Identified: Fall Risk      Long-Range Goal: Absence of Fall and Fall-Related Injury   Start Date: 09/06/2021  Expected End Date: 01/04/2022  Priority: High  Note:   Fall Risk  09/06/2021 05/21/2021 05/21/2021 02/21/2021 01/28/2021  Falls in the past year? 1 (No Data) 1 0 0  Comment - Reports accidental fall within the last week. - - -  Number falls in past yr: 1 0 - 0 0  Injury with Fall? 1 0 - - 0  Risk for fall due to : History of fall(s);Medication side effect;Impaired mobility;Orthopedic patient History of fall(s);Medication side effect;Impaired balance/gait - Other (Comment) No Fall Risks  Risk for fall due to: Comment - - - Pending surgery d/t chronic back pain. -  Follow up Falls prevention discussed Falls evaluation completed - Falls prevention discussed Falls evaluation completed     Current Barriers:  Risk for Falls d//t Impaired Gait  Clinical Goal(s):  Over the next 90 days, patient will not experience falls and maintain an optimal level of function.   Interventions:  Collaboration with [name], MD regarding development and update of comprehensive plan of  care as evidenced by provider attestation and co-signature Inter-disciplinary care team collaboration (see longitudinal plan of care) Discussed fall and safety measures. Reports a recent fall while in the shower resulting in an injury to her right lower extremity. She is being treated by [facility]. Reports discontinuing metolazone as instructed and wearing her cast as advised. Reports she is not bearing weight on her right leg. She is not currently receiving therapy. Reports using a rollator walker as a scoot chair to move throughout the home. Reports following recommended activity restrictions. Discussed ability to performs ADL's. Reports performing ADL's independently. Confirmed a friend is available in the home to assist as needed. Agreed to update the care management team if functional status changes and in-home assistance is require. Will follow up with [facility] on 09/11/21.    Self-Care Deficits/Patient Goals:  Utilize assistive device appropriately with all ambulation Ensure pathways are clear and well lit Change positions slowly  Follow recommended lifting and activity restrictions-Wear secure fitting, skid free footwear when ambulating Follow up [facility] as scheduled Notify provider or care management team for questions and new concerns as needed   Follow Up  Plan:  Will follow up within the next month       PLAN A member of the care management team will follow up next month.   Cristy [facility]lander [facility] Care Management [facility] [phone]

## 2021-09-06 NOTE — Patient Instructions (Addendum)
Thank you for allowing the Chronic Care Management team to participate in your care.   Goals Addressed; Patient Care Plan: Diabetes Type 1 (Adult)     Problem Identified: Glycemic Management (Diabetes, Type 1)      Long-Range Goal: Glycemic Management Optimized   Start Date: 09/06/2021  Expected End Date: 12/05/2021  Priority: High  Note:   Objective:  Lab Results  Component Value Date   HGBA1C 7.6 (A) 06/13/2021    Lab Results  Component Value Date   HGBA1C 7.9 (H) 03/18/2021     Current Barriers:  Chronic Disease Management support and educational needs r/t Diabetes.  Case Manager Clinical Goal(s):  Over the next 90 days, patient will not require emergent care d/t complications r/t hypoglycemia. Over the next 90 days, patient will demonstrate adherence to prescribed treatment plan for Diabetes self management as evidenced by consistently assessing readings on her continuous glucose monitoring device, taking medications as prescribed and adhering to the recommended ADA/carb modified diet.   Interventions:  Collaboration with [name], MD, regarding development and update of comprehensive plan of care as evidenced by provider attestation and co-signature Inter-disciplinary care team collaboration (see longitudinal plan of care) Reviewed compliance with current treatment plan. Reports excellent compliance with medications and insulin administration.  Monitoring readings Dexcom G6. Reports several fluctuations in readings over the past few weeks.  Reviewed s/sx of hypoglycemia and hyperglycemia. Denies symptoms. Verbalized awareness of appropriate interventions.  Discussed nutritional intake. She is attempting to follow the recommended diet. Activity remains limited d/t recent fall related injury.  Advised to update the care management team if additional diabetes resources are needed.   Patient Goals/Self-Care Activities Self administer oral medications and insulin as  prescribed Attend all scheduled provider appointments Adhere to prescribed ADA/carb modified Notify provider or care management team with questions and new concerns as needed   Follow Up Plan:  Will follow up next month     Long-Range Goal: Blood Pressure Monitored   Start Date: 09/06/2021  Expected End Date: 12/05/2021  Priority: High  Note:   Objective:  Last practice recorded BP readings:  BP Readings from Last 3 Encounters:  09/04/21 124/75  08/23/21 121/62  06/13/21 128/76   Most recent eGFR/CrCl:  Lab Results  Component Value Date   EGFR 94 09/04/2021    No components found for: CRCL  Current Barriers:  Chronic Disease Management support and educational needs related to BP management.  Case Manager Clinical Goal(s):  Over the next 90 days, patient will demonstrate adherence to prescribed treatment plan for BP management as evidenced by taking all medications as prescribed, monitoring and recording blood pressure, and adhering to a cardiac prudent/heart healthy diet.  Interventions:  Collaboration with Brita Romp Dionne Bucy, MD regarding development and update of comprehensive plan of care as evidenced by provider attestation and co-signature Inter-disciplinary care team collaboration (see longitudinal plan of care) Reviewed medications and importance of compliance. Reports taking medications as prescribed and discontinuing metolazone as instructed. Provided information regarding established blood pressure parameters. Advised to notify a provider for low or elevated readings. Encouraged to monitor and record readings Discussed compliance with recommended cardiac prudent diet. Encouraged to read nutrition labels and avoid highly processed foods when possible. Reviewed s/sx of heart attack, stroke and worsening symptoms that require immediate medical attention.    Patient Goals/Self-Care Activities: Self-administer medications as prescribed. Monitor and record blood  pressure Adhere to recommended cardiac prudent/heart healthy diet Notify provider or care management team with questions and new  concerns as needed    Follow Up Plan:  Will follow up next month     Patient Care Plan: Fall Risk (Adult)     Problem Identified: Fall Risk      Long-Range Goal: Absence of Fall and Fall-Related Injury   Start Date: 09/06/2021  Expected End Date: 01/04/2022  Priority: High  Note:   Fall Risk  09/06/2021 05/21/2021 05/21/2021 02/21/2021 01/28/2021  Falls in the past year? 1 (No Data) 1 0 0  Comment - Reports accidental fall within the last week. - - -  Number falls in past yr: 1 0 - 0 0  Injury with Fall? 1 0 - - 0  Risk for fall due to : History of fall(s);Medication side effect;Impaired mobility;Orthopedic patient History of fall(s);Medication side effect;Impaired balance/gait - Other (Comment) No Fall Risks  Risk for fall due to: Comment - - - Pending surgery d/t chronic back pain. -  Follow up Falls prevention discussed Falls evaluation completed - Falls prevention discussed Falls evaluation completed     Current Barriers:  Risk for Falls d//t Impaired Gait  Clinical Goal(s):  Over the next 90 days, patient will not experience falls and maintain an optimal level of function.   Interventions:  Collaboration with [name], MD regarding development and update of comprehensive plan of care as evidenced by provider attestation and co-signature Inter-disciplinary care team collaboration (see longitudinal plan of care) Discussed fall and safety measures. Reports a recent fall while in the shower resulting in an injury to her right lower extremity. She is being treated by [facility]. Reports discontinuing metolazone as instructed and wearing her cast as advised. Reports she is not bearing weight on her right leg. She is not currently receiving therapy. Reports using a rollator walker as a scoot chair to move throughout the home. Reports following  recommended activity restrictions. Discussed ability to performs ADL's. Reports performing ADL's independently. Confirmed a friend is available in the home to assist as needed. Agreed to update the care management team if functional status changes and in-home assistance is require. Will follow up with [facility] on 09/11/21.    Self-Care Deficits/Patient Goals:  Utilize assistive device appropriately with all ambulation Ensure pathways are clear and well lit Change positions slowly  Follow recommended lifting and activity restrictions-Wear secure fitting, skid free footwear when ambulating Follow up [facility] as scheduled Notify provider or care management team for questions and new concerns as needed   Follow Up Plan:  Will follow up within the next month       Ms. Herzberg verbalized understanding of the information discussed during the telephonic outreach. Declined need for mailed/printed instructions. A member of the care management team will follow up next month.   Cristy [facility] Care Management [facility] [phone]

## 2021-09-10 ENCOUNTER — Telehealth: Payer: Self-pay

## 2021-09-10 DIAGNOSIS — E039 Hypothyroidism, unspecified: Secondary | ICD-10-CM

## 2021-09-10 MED ORDER — LEVOTHYROXINE SODIUM 100 MCG PO TABS: ORAL_TABLET | ORAL | 1 refills | Status: DC

## 2021-09-10 NOTE — Telephone Encounter (Signed)
[facility] faxed refill request for the following medications:  levothyroxine (SYNTHROID) 100 MCG tablet  Please advise.

## 2021-09-11 DIAGNOSIS — M1712 Unilateral primary osteoarthritis, left knee: Secondary | ICD-10-CM | POA: Diagnosis not present

## 2021-09-15 DIAGNOSIS — Z23 Encounter for immunization: Secondary | ICD-10-CM | POA: Diagnosis not present

## 2021-09-20 DIAGNOSIS — E1069 Type 1 diabetes mellitus with other specified complication: Secondary | ICD-10-CM | POA: Diagnosis not present

## 2021-09-20 DIAGNOSIS — E785 Hyperlipidemia, unspecified: Secondary | ICD-10-CM | POA: Diagnosis not present

## 2021-09-20 DIAGNOSIS — E103293 Type 1 diabetes mellitus with mild nonproliferative diabetic retinopathy without macular edema, bilateral: Secondary | ICD-10-CM

## 2021-09-20 DIAGNOSIS — E1059 Type 1 diabetes mellitus with other circulatory complications: Secondary | ICD-10-CM | POA: Diagnosis not present

## 2021-09-20 DIAGNOSIS — I152 Hypertension secondary to endocrine disorders: Secondary | ICD-10-CM | POA: Diagnosis not present

## 2021-09-20 NOTE — Patient Instructions (Signed)
Visit Information It was great speaking with you today!  Please let me know if you have any questions about our visit.   Goals Addressed             This Visit's Progress    Monitor and Manage My Blood Sugar-Diabetes Type 1   On track    Timeframe:  Long-Range Goal Priority:  High Start Date: 02/26/2021                            Expected End Date: 08/29/2022                      Follow Up within 90 days    - check blood sugar at prescribed times - check blood sugar if I feel it is too high or too low - take the blood sugar meter to all doctor visits    Why is this important?   Checking your blood sugar at home helps to keep it from getting very high or very low.  Writing the results in a diary or log helps the doctor know how to care for you.  Your blood sugar log should have the time, the date and the results.  Also, write down the amount of insulin or other medicine you take.  Other information like what you ate, exercise done and how you were feeling will also be helpful..     Notes:        Patient Care Plan: General Pharmacy (Adult)     Problem Identified: Hypertension, Hyperlipidemia, Diabetes, Hypothyroidism and Osteopenia   Priority: High     Long-Range Goal: Patient-Specific Goal   Start Date: 02/26/2021  Expected End Date: 09/20/2022  This Visit's Progress: On track  Recent Progress: On track  Priority: High  Note:   Current Barriers:  Unable to independently afford treatment regimen  Pharmacist Clinical Goal(s):  Over the next 90 days, patient will maintain control of diabetes as evidenced by A1c less than 8%  through collaboration with PharmD and provider.   Interventions: 1:1 collaboration with Mar Daring, PA-C regarding development and update of comprehensive plan of care as evidenced by provider attestation and co-signature Inter-disciplinary care team collaboration (see longitudinal plan of care) Comprehensive medication review performed;  medication list updated in electronic medical record  Hypertension (BP goal <140/90) -Controlled -Current treatment: Furosemide 20 mg daily  -Medications previously tried: Amlodipine, Lisinopril, Torsemide  -Current home readings: NA -Current dietary habits: Patient tries to follow a low-carb, low sodium diet -Current exercise habits: Walking -Denies hypotensive/hypertensive symptoms -Educated on Daily salt intake goal < 2300 mg; Importance of home blood pressure monitoring; -Counseled to monitor BP at home weekly, document, and provide log at future appointments -Recommended to continue current medication  Hyperlipidemia: (LDL goal < 70) -Uncontrolled -Current treatment: Rosuvastatin 5 mg daily  -Medications previously tried: Atorvastatin 10 mg   -Educated on Importance of limiting foods high in cholesterol; -Counseled on diet and exercise extensively  Diabetes (A1c goal <8%) -Controlled -Current medications: Novolog up to 20 units three times daily  1 unit per 15 g carbs 1 unit for every 50 glucose > 200 mg/dL Lantus 20 units with breakfast    -Medications previously tried: NA    Target 5/28-6/10 8/27-9/9  Number of days worn ? 14 days 14 14  % of time active ? 70% 93% 93%  Mean Glucose (mg/dL)  159 173  GMI (2-week A1c estimate)  7.1% 7.4%  Standard Deviation (mg/dL)  53 60  Time above >250 mg/dL <10% 7% 12%  Time above 70-180 mg/dL <50% 23% 27%  Time in range: 70-180 mg/dL >50% 69% 59%  Time below 70 mg/dL <1% <1% 1%  -Reports hypoglycemic symptoms: A few instances of lows overnight/early morning. Felt weak, shaky, sweaty.  -Continue current medications   Osteopenia (Goal Maintain bone density and prevent fractures) -Controlled -Patient is not a candidate for pharmacologic treatment -Current treatment  Vitamin D 10,000 units   -Medications previously tried: NA  -Recommend [phone] units of vitamin D daily. Recommend 1200 mg of calcium daily from dietary and  supplemental sources. -Recommended to continue current medication  Hypothyroidism (Goal: maintain stable thyroid function) -Controlled -Current treatment  Levothyroxine 100 mcg daily before breakfast  -Medications previously tried: NA  -Recommended to continue current medication  GERD (Goal: Minimize symptoms of heartburn or reflux) -Controlled -Current treatment  Omeprazole 40 mg daily  -Medications previously tried: NA  -Patient reports significant reflux with single missed dose - Counseled on importance of trigger avoidance -Recommended to continue current medication  Patient Goals/Self-Care Activities Over the next 90 days, patient will:  - check glucose at 5-10 times daily , document, and provide at future appointments check blood pressure weekly, document, and provide at future appointments  Follow Up Plan: Telephone follow up appointment with care management team member scheduled for:  11/29/21 at 11:00 AM    Patient agreed to services and verbal consent obtained.   Patient verbalizes understanding of instructions provided today and agrees to view in [place].   [name], PharmD, [place], [place] [phone]

## 2021-09-23 DIAGNOSIS — Z23 Encounter for immunization: Secondary | ICD-10-CM | POA: Diagnosis not present

## 2021-09-30 ENCOUNTER — Telehealth: Payer: Self-pay

## 2021-09-30 NOTE — Progress Notes (Signed)
Chronic Care Management Pharmacy Assistant   Name: Karla Patton  MRN: [id number] DOB: 12-Feb-1951  Reason for Encounter: Hypertension Disease State Call.   Recent office visits:  09/06/2021 Neldon Labella RN (CCM) 09/04/2021 Dr.Gilbert MD (PCP Office)AMB referral to orthopedics, No Medication Changes noted  Recent consult visits:  No recent [facility] visits:  None in previous 6 months  Medications: Outpatient Encounter Medications as of 09/30/2021  Medication Sig Note   acetaminophen (TYLENOL) 500 MG tablet Take 1,000 mg by mouth daily as needed.    ALPRAZolam (XANAX) 0.5 MG tablet TAKE 1 TABLET(0.5 MG) BY MOUTH TWICE DAILY    APPLE CIDER VINEGAR PO Take 600 mg by mouth daily.     aspirin EC 81 MG tablet Take 81 mg by mouth daily. Takes at bedtime    beta carotene 25000 UNIT capsule Take 25,000 Units by mouth daily.    Cholecalciferol (VITAMIN D3) 250 MCG (10000 UT) capsule Take 10,000 Units by mouth daily.    Coenzyme Q10 (COQ10) 100 MG CAPS Take 100 mg by mouth daily. With cinnamon    CONTOUR NEXT TEST test strip CHECK BLOOD SUGAR BEFORE MEALS AND AT BEDTIME    Ferrous Sulfate Dried (HIGH POTENCY IRON) 65 MG TABS Take 1 tablet by mouth daily.    fluticasone (FLONASE) 50 MCG/ACT nasal spray Place 2 sprays into both nostrils daily.    furosemide (LASIX) 20 MG tablet TAKE 1 TABLET(20 MG) BY MOUTH DAILY (Patient taking differently: Take 20 mg by mouth daily. TAKE 1 TABLET(20 MG) BY MOUTH DAILY)    insulin aspart (NOVOLOG FLEXPEN) 100 UNIT/ML FlexPen Inject 5-10 Units into the skin 3 (three) times daily with meals. Per sliding scale    insulin glargine (LANTUS SOLOSTAR) 100 UNIT/ML Solostar Pen Inject 20 Units into the skin daily with breakfast.    Insulin Pen Needle 31G X 8 MM MISC Use to inject insulin 4-6 times daily as prescribed    levothyroxine (SYNTHROID) 100 MCG tablet TAKE 1 TABLET BY MOUTH DAILY BEFORE BREAKFAST    MAGNESIUM-ZINC PO Take 1 tablet by mouth  daily.    Multiple Vitamins-Minerals (ABC PLUS SENIOR ADULTS 50+ PO) Take 1 tablet by mouth daily. 02/26/2021: Centrum Silver    omeprazole (PRILOSEC) 40 MG capsule TAKE 1 CAPSULE(40 MG) BY MOUTH DAILY    pregabalin (LYRICA) 225 MG capsule TAKE 1 CAPSULE(225 MG) BY MOUTH DAILY    rosuvastatin (CRESTOR) 5 MG tablet Take 1 tablet (5 mg total) by mouth daily.    No facility-administered encounter medications on file as of 09/30/2021.    Care Gaps: Shingrix Vaccine COVID-19 Vaccine (3- Booster Pifzer series) Influenza Vaccine Urine Microalbumin (Last Completed 07/23/2020) Ophthalmology Exam (Last Completed 08/20/2020) Star Rating Drugs: Rosuvastatin 5 mg last filled 06/13/2021 for 90 day supply at [facility]. Medication Fill Gaps: Levothyroxine 100 MCG last filled 04/16/2021 90 day supply Pregabalin  225 MG  last filled 05/15/2021 90 day supply  Reviewed chart prior to disease state call. Spoke with patient regarding BP  Recent Office Vitals: BP Readings from Last 3 Encounters:  09/04/21 124/75  08/23/21 121/62  06/13/21 128/76   Pulse Readings from Last 3 Encounters:  09/04/21 68  08/23/21 78  06/13/21 97    Wt Readings from Last 3 Encounters:  08/23/21 189 lb 12.8 oz (86.1 kg)  06/13/21 187 lb 3.2 oz (84.9 kg)  03/18/21 194 lb (88 kg)     Kidney Function Lab Results  Component Value Date/Time  CREATININE 0.69 09/04/2021 03:52 PM   CREATININE 0.74 08/06/2021 09:08 AM   GFR 91.51 03/01/2015 09:55 AM   GFRNONAA >60 03/19/2021 04:20 AM   GFRAA 95 01/28/2021 11:29 AM    BMP Latest Ref Rng & Units 09/04/2021 08/06/2021 06/13/2021  Glucose 65 - 99 mg/dL 101(H) 192(H) 339(H)  BUN 8 - 27 mg/dL 13 11 12   Creatinine 0.57 - 1.00 mg/dL 0.69 0.74 0.73  BUN/Creat Ratio 12 - 28 19 15 16   Sodium 134 - 144 mmol/L 134 130(L) 130(L)  Potassium 3.5 - 5.2 mmol/L 3.9 5.3(H) 5.5(H)  Chloride 96 - 106 mmol/L 90(L) 92(L) 91(L)  CO2 20 - 29 mmol/L 29 24 23   Calcium 8.7 - 10.3 mg/dL  9.5 9.2 9.2    Current antihypertensive regimen:  Furosemide 20 mg daily  How often are you checking your Blood Pressure? daily (Patient states she checks her blood pressure daily if she can remember)  Patient reports she purchase a new blood pressure meter call Smart Great good blood pressure monitor that sends her readings to her app on her phone.Patient states if she is able to send her readings to her PCP or Clinical pharmacist.Notified Clinical Pharmacist.  Per Clinical Pharmacist, I am unfamiliar with that product, it uses the [place] it seems like, so may be possible to download it and request to share data from her device if she gives permission. If she says its ok I am fine if you want to try it.    Patient states it has a option to Email, but she will look into and follow up with the clinical pharmacist on her appointment in December.    Current home BP readings:  On 09/30/2021 it was 142/85 with a pulse of 91. (Patient states she had a lot going on) On 09/29/2021 it was 120/74 pm with a pulse of 105. On 09/29/2021 it was 117/75 am with a pulse of 109. On 09/28/2021 it was 111/70 with a pulse of 85.  What recent interventions/DTPs have been made by any provider to improve Blood Pressure control since last CPP Visit: None ID  Any recent hospitalizations or ED visits since last visit with CPP? No  What diet changes have been made to improve Blood Pressure Control?  Patient states she limits the amount of salt intake,chips, but eats at restaurants daily. Patient reports she will enjoy  vegetables bowl.   What exercise is being done to improve your Blood Pressure Control?  Patient states she is unable to exercise at this time because she broken her leg and knee pain.  Patient states she has test strips that expires back in 10/2020.Patient is asking if it is okay to still use them even though they are expire.Patient states she prefers not to waste.Patient reports she has  the [facility] but she stills checks her blood sugar.Notified Clinical Pharmacist.   Per Clinical Pharmacist, they are more likely to give inaccurate readings and are not reliable for monitoring. I would recommend getting newer test strips.   Patient Verbalized understanding.  Adherence Review: Is the patient currently on ACE/ARB medication? No Does the patient have >5 day gap between last estimated fill dates? No   Telephone follow up appointment with Care management team member scheduled for : 11/29/2021 at 11:00 am.  [place] Pharmacist Assistant [phone]

## 2021-10-07 ENCOUNTER — Ambulatory Visit (INDEPENDENT_AMBULATORY_CARE_PROVIDER_SITE_OTHER): Payer: Medicare Other

## 2021-10-07 DIAGNOSIS — E103293 Type 1 diabetes mellitus with mild nonproliferative diabetic retinopathy without macular edema, bilateral: Secondary | ICD-10-CM

## 2021-10-07 DIAGNOSIS — E1069 Type 1 diabetes mellitus with other specified complication: Secondary | ICD-10-CM

## 2021-10-07 DIAGNOSIS — Z9181 History of falling: Secondary | ICD-10-CM

## 2021-10-07 DIAGNOSIS — I152 Hypertension secondary to endocrine disorders: Secondary | ICD-10-CM

## 2021-10-07 NOTE — Patient Instructions (Addendum)
Thank you for allowing the Chronic Care Management team to participate in your care.    Patient Care Plan: Diabetes Type 1 (Adult)     Problem Identified: Glycemic Management (Diabetes, Type 1)      Long-Range Goal: Glycemic Management Optimized   Start Date: 09/06/2021  Expected End Date: 12/05/2021  Priority: High  Note:   Objective:  Lab Results  Component Value Date   HGBA1C 7.6 (A) 06/13/2021    Lab Results  Component Value Date   HGBA1C 7.9 (H) 03/18/2021     Current Barriers:  Chronic Disease Management support and educational needs r/t Diabetes.  Case Manager Clinical Goal(s):  Over the next 90 days, patient will not require emergent care d/t complications r/t hypoglycemia. Over the next 90 days, patient will demonstrate adherence to prescribed treatment plan for Diabetes self management as evidenced by consistently assessing readings on her continuous glucose monitoring device, taking medications as prescribed and adhering to the recommended ADA/carb modified diet.   Interventions:  Collaboration with [name], MD, regarding development and update of comprehensive plan of care as evidenced by provider attestation and co-signature Inter-disciplinary care team collaboration (see longitudinal plan of care) Reviewed compliance with current treatment plan. Reports compliance with medications and insulin administration. Continues to utilize sliding scale insulin as advised. Reports some fluctuations with readings but notes most have been within range. She is attempting to adhere to a diabetic/heart healthy diet. Her activity remains limited. She recalls an elevated readings in the 300's. Denies experiencing symptoms with elevated readings. Expressed concerns that some alarms may be inaccurate. Advised to continue using Dexcom G6 and recheck blood glucose levels with standard glucometer if concerned that readings/alarms are inaccurate. Reviewed established ranges. She is  very knowledgeable of appropriate interventions and indications for notifying a provider.    Patient Goals/Self-Care Activities Self administer oral medications and insulin as prescribed Continue monitoring blood glucose levels Attend all scheduled provider appointments Adhere to prescribed ADA/carb modified Notify provider or care management team with questions and new concerns as needed   Follow Up Plan:  Will follow up in two months     Long-Range Goal: Blood Pressure Monitored   Start Date: 09/06/2021  Expected End Date: 12/05/2021  Priority: High  Note:    Current Barriers:  Chronic Disease Management support and educational needs related to HTN management.  Case Manager Clinical Goal(s):  Over the next 90 days, patient will demonstrate adherence to prescribed treatment plan for HTN management as evidenced by taking all medications as prescribed, monitoring and recording blood pressure, and adhering to a cardiac prudent/heart healthy diet.  Interventions:  Collaboration with Brita Romp Dionne Bucy, MD regarding development and update of comprehensive plan of care as evidenced by provider attestation and co-signature Inter-disciplinary care team collaboration (see longitudinal plan of care) Reviewed medications and compliance with current treatment plan. Reports excellent compliance with medications. Reports consistently monitoring BP and pulse as advised. Reviewed established BP parameters. Reports systolic readings have ranged from 110's to low 140's. Diastolic readings have ranged in the 70's and 80's. Denies episodes of chest discomfort, palpitations, lightheadedness of dizziness. No complaints of headaches.  Discussed nutritional intake and activity. Reports doing well with diet. Attempting to monitor sodium intake and comply with a heart heathy diet. Activity is limited due to mobility restrictions. Advised to continue reading nutrition labels, monitor sodium intake and avoid  highly processed foods when possible.  Reviewed s/sx of heart attack, stroke and worsening symptoms that require immediate medical attention.  Patient Goals/Self-Care Activities: Self-administer medications as prescribed. Monitor and record blood pressure Adhere to recommended cardiac prudent/heart healthy diet Notify provider or care management team with questions and new concerns as needed    Follow Up Plan:  Will follow up in two months     Patient Care Plan: Fall Risk (Adult)     Problem Identified: Fall Risk      Long-Range Goal: Absence of Fall and Fall-Related Injury   Start Date: 09/06/2021  Expected End Date: 01/04/2022  Priority: High  Note:    Current Barriers:  Risk for Falls d/t Impaired Gait r/t Lumbar Fusion  Clinical Goal(s):  Over the next 90 days, patient will not experience falls and maintain an optimal level of function.   Interventions:  Collaboration with [name], MD regarding development and update of comprehensive plan of care as evidenced by provider attestation and co-signature Inter-disciplinary care team collaboration (see longitudinal plan of care) Reviewed fall and safety measures. Denies falls since last right leg injury. Reports following safety recommendations and adhering to activity restrictions. She is currently wearing a cast to her right lower leg. Denies pain. Continues using her rollator walker as a scoot chair while in the home. Anticipates having her cast removed at her next Ortho visit. She will follow up with [facility] on 10/09/21.  Reviewed functional status. She continues to perform ADL's independently. Has very good support from family and friends. Denies current need for additional assistance.    Self-Care Deficits/Patient Goals:  Utilize assistive device appropriately with all ambulation Ensure pathways are clear and well lit Change positions slowly  Follow recommended lifting and activity  restrictions Wear secure fitting, skid free footwear when ambulating Follow up [facility] as scheduled on 10/09/21   Follow Up Plan:  Will follow up in two months      Ms. Bainter verbalized understanding of the information discussed during the telephonic outreach. Declined need for mailed/printed instructions. A member of the care management team will follow up in two months.   Cristy Friedlander [facility] Care Management [facility] [phone]

## 2021-10-07 NOTE — Chronic Care Management (AMB) (Signed)
Chronic Care Management   CCM RN Visit Note  10/07/2021 Name: Karla Patton MRN: [id number] DOB: 01-02-1951  Subjective: Karla Patton is a 70 y.o. year old female who is a primary care patient of Bacigalupo, Dionne Bucy, MD. The care management team was consulted for assistance with disease management and care coordination needs.    Engaged with patient by telephone for follow up visit in response to provider referral for case management and care coordination services.   Consent to Services:  The patient was given information about Chronic Care Management services, agreed to services, and gave verbal consent prior to initiation of services.  Please see initial visit note for detailed documentation.     Assessment: Review of patient past medical history, allergies, medications, health status, including review of consultants reports, laboratory and other test data, was performed as part of comprehensive evaluation and provision of chronic care management services.   SDOH (Social Determinants of Health) assessments and interventions performed: No  CCM Care Plan  No Known Allergies  Outpatient Encounter Medications as of 10/07/2021  Medication Sig Note   acetaminophen (TYLENOL) 500 MG tablet Take 1,000 mg by mouth daily as needed.    ALPRAZolam (XANAX) 0.5 MG tablet TAKE 1 TABLET(0.5 MG) BY MOUTH TWICE DAILY    APPLE CIDER VINEGAR PO Take 600 mg by mouth daily.     aspirin EC 81 MG tablet Take 81 mg by mouth daily. Takes at bedtime    beta carotene 25000 UNIT capsule Take 25,000 Units by mouth daily.    Cholecalciferol (VITAMIN D3) 250 MCG (10000 UT) capsule Take 10,000 Units by mouth daily.    Coenzyme Q10 (COQ10) 100 MG CAPS Take 100 mg by mouth daily. With cinnamon    CONTOUR NEXT TEST test strip CHECK BLOOD SUGAR BEFORE MEALS AND AT BEDTIME    Ferrous Sulfate Dried (HIGH POTENCY IRON) 65 MG TABS Take 1 tablet by mouth daily.    fluticasone (FLONASE) 50 MCG/ACT nasal spray Place 2  sprays into both nostrils daily.    furosemide (LASIX) 20 MG tablet TAKE 1 TABLET(20 MG) BY MOUTH DAILY (Patient taking differently: Take 20 mg by mouth daily. TAKE 1 TABLET(20 MG) BY MOUTH DAILY)    insulin aspart (NOVOLOG FLEXPEN) 100 UNIT/ML FlexPen Inject 5-10 Units into the skin 3 (three) times daily with meals. Per sliding scale    insulin glargine (LANTUS SOLOSTAR) 100 UNIT/ML Solostar Pen Inject 20 Units into the skin daily with breakfast.    Insulin Pen Needle 31G X 8 MM MISC Use to inject insulin 4-6 times daily as prescribed    levothyroxine (SYNTHROID) 100 MCG tablet TAKE 1 TABLET BY MOUTH DAILY BEFORE BREAKFAST    MAGNESIUM-ZINC PO Take 1 tablet by mouth daily.    Multiple Vitamins-Minerals (ABC PLUS SENIOR ADULTS 50+ PO) Take 1 tablet by mouth daily. 02/26/2021: Centrum Silver    omeprazole (PRILOSEC) 40 MG capsule TAKE 1 CAPSULE(40 MG) BY MOUTH DAILY    pregabalin (LYRICA) 225 MG capsule TAKE 1 CAPSULE(225 MG) BY MOUTH DAILY    rosuvastatin (CRESTOR) 5 MG tablet Take 1 tablet (5 mg total) by mouth daily.    No facility-administered encounter medications on file as of 10/07/2021.    Patient Active Problem List   Diagnosis Date Noted   Varicose veins of lower extremity with edema, bilateral 08/23/2021   Nasal congestion 08/23/2021   JVD (jugular venous distension) 08/23/2021   Venous insufficiency of both lower extremities 08/23/2021   Hyperlipidemia due to  type 1 diabetes mellitus ([place]) 06/13/2021   Anemia 06/13/2021   Class 1 obesity with serious comorbidity and body mass index (BMI) of 31.0 to 31.9 in adult 06/13/2021   Spondylolisthesis of lumbar region 03/19/2021   Spondylolisthesis, lumbar region 03/18/2021   Lymphedema 10/30/2019   Type 1 diabetes mellitus with diabetic nephropathy ([place]) 09/01/2018   Type 1 diabetes mellitus with polyneuropathy ([place]) 09/01/2018   Hypertension associated with diabetes ([place]) 03/03/2018   Precordial pain 03/03/2018   Headache disorder  02/10/2018   Osteopenia 01/13/2018   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 ([place]) 08/28/2017   Hepatic cyst 08/28/2017   Tinnitus of right ear 08/28/2017   Hearing loss of right ear 08/28/2017   Constipation 08/28/2017   Background diabetic retinopathy ([place]) 05/22/2015   Hypothyroidism 10/10/2014   Diabetic hypoglycemia ([place]) 10/10/2014   Varicose veins of lower extremities with other complications 78/24/2353    Conditions to be addressed/monitored:HTN, DMII, and Falls  Patient Care Plan: Diabetes Type 1 (Adult)     Problem Identified: Glycemic Management (Diabetes, Type 1)      Long-Range Goal: Glycemic Management Optimized   Start Date: 09/06/2021  Expected End Date: 12/05/2021  Priority: High  Note:   Objective:  Lab Results  Component Value Date   HGBA1C 7.6 (A) 06/13/2021    Lab Results  Component Value Date   HGBA1C 7.9 (H) 03/18/2021     Current Barriers:  Chronic Disease Management support and educational needs r/t Diabetes.  Case Manager Clinical Goal(s):  Over the next 90 days, patient will not require emergent care d/t complications r/t hypoglycemia. Over the next 90 days, patient will demonstrate adherence to prescribed treatment plan for Diabetes self management as evidenced by consistently assessing readings on her continuous glucose monitoring device, taking medications as prescribed and adhering to the recommended ADA/carb modified diet.   Interventions:  Collaboration with [name], MD, regarding development and update of comprehensive plan of care as evidenced by provider attestation and co-signature Inter-disciplinary care team collaboration (see longitudinal plan of care) Reviewed compliance with current treatment plan. Reports compliance with medications and insulin administration. Continues to utilize sliding scale insulin as advised. Reports some fluctuations with readings but  notes most have been within range. She is attempting to adhere to a diabetic/heart healthy diet. Her activity remains limited. She recalls an elevated readings in the 300's. Denies experiencing symptoms with elevated readings. Expressed concerns that some alarms may be inaccurate. Advised to continue using Dexcom G6 and recheck blood glucose levels with standard glucometer if concerned that readings/alarms are inaccurate. Reviewed established ranges. She is very knowledgeable of appropriate interventions and indications for notifying a provider.    Patient Goals/Self-Care Activities Self administer oral medications and insulin as prescribed Continue monitoring blood glucose levels Attend all scheduled provider appointments Adhere to prescribed ADA/carb modified Notify provider or care management team with questions and new concerns as needed   Follow Up Plan:  Will follow up in two months     Long-Range Goal: Blood Pressure Monitored   Start Date: 09/06/2021  Expected End Date: 12/05/2021  Priority: High  Note:    Current Barriers:  Chronic Disease Management support and educational needs related to HTN management.  Case Manager Clinical Goal(s):  Over the next 90 days, patient will demonstrate adherence to prescribed treatment plan for HTN management as evidenced by taking all medications as prescribed, monitoring and recording blood pressure, and adhering to a cardiac prudent/heart  healthy diet.  Interventions:  Collaboration with Brita Romp Dionne Bucy, MD regarding development and update of comprehensive plan of care as evidenced by provider attestation and co-signature Inter-disciplinary care team collaboration (see longitudinal plan of care) Reviewed medications and compliance with current treatment plan. Reports excellent compliance with medications. Reports consistently monitoring BP and pulse as advised. Reviewed established BP parameters. Reports systolic readings have ranged  from 110's to low 140's. Diastolic readings have ranged in the 70's and 80's. Denies episodes of chest discomfort, palpitations, lightheadedness of dizziness. No complaints of headaches.  Discussed nutritional intake and activity. Reports doing well with diet. Attempting to monitor sodium intake and comply with a heart heathy diet. Activity is limited due to mobility restrictions. Advised to continue reading nutrition labels, monitor sodium intake and avoid highly processed foods when possible.  Reviewed s/sx of heart attack, stroke and worsening symptoms that require immediate medical attention.    Patient Goals/Self-Care Activities: Self-administer medications as prescribed. Monitor and record blood pressure Adhere to recommended cardiac prudent/heart healthy diet Notify provider or care management team with questions and new concerns as needed    Follow Up Plan:  Will follow up in two months     Patient Care Plan: Fall Risk (Adult)     Problem Identified: Fall Risk      Long-Range Goal: Absence of Fall and Fall-Related Injury   Start Date: 09/06/2021  Expected End Date: 01/04/2022  Priority: High  Note:    Current Barriers:  Risk for Falls d/t Impaired Gait r/t Lumbar Fusion  Clinical Goal(s):  Over the next 90 days, patient will not experience falls and maintain an optimal level of function.   Interventions:  Collaboration with [name], MD regarding development and update of comprehensive plan of care as evidenced by provider attestation and co-signature Inter-disciplinary care team collaboration (see longitudinal plan of care) Reviewed fall and safety measures. Denies falls since last right leg injury. Reports following safety recommendations and adhering to activity restrictions. She is currently wearing a cast to her right lower leg. Denies pain. Continues using her rollator walker as a scoot chair while in the home. Anticipates having her cast removed at her  next Ortho visit. She will follow up with [facility] on 10/09/21.  Reviewed functional status. She continues to perform ADL's independently. Has very good support from family and friends. Denies current need for additional assistance.    Self-Care Deficits/Patient Goals:  Utilize assistive device appropriately with all ambulation Ensure pathways are clear and well lit Change positions slowly  Follow recommended lifting and activity restrictions Wear secure fitting, skid free footwear when ambulating Follow up [facility] as scheduled on 10/09/21   Follow Up Plan:  Will follow up in two months     PLAN A member of the care management team will follow up in two months.   Cristy Friedlander [facility] Care Management [facility] [phone]

## 2021-10-09 DIAGNOSIS — S82831A Other fracture of upper and lower end of right fibula, initial encounter for closed fracture: Secondary | ICD-10-CM | POA: Diagnosis not present

## 2021-10-14 ENCOUNTER — Ambulatory Visit: Payer: Self-pay

## 2021-10-14 DIAGNOSIS — F419 Anxiety disorder, unspecified: Secondary | ICD-10-CM

## 2021-10-14 DIAGNOSIS — E103293 Type 1 diabetes mellitus with mild nonproliferative diabetic retinopathy without macular edema, bilateral: Secondary | ICD-10-CM

## 2021-10-14 NOTE — Chronic Care Management (AMB) (Signed)
Chronic Care Management   CCM RN Visit Note  10/14/2021 Name: Karla Patton MRN: [id number] DOB: 1951-09-16  Subjective: Karla Patton is a 70 y.o. year old female who is a primary care patient of Bacigalupo, Dionne Bucy, MD. The care management team was consulted for assistance with disease management and care coordination needs.    Engaged with patient by telephone for follow up visit in response to provider referral for case management and care coordination services.   Consent to Services:  The patient was given information about Chronic Care Management services, agreed to services, and gave verbal consent prior to initiation of services.  Please see initial visit note for detailed documentation.   Assessment: Review of patient past medical history, allergies, medications, health status, including review of consultants reports, laboratory and other test data, was performed as part of comprehensive evaluation and provision of chronic care management services.   SDOH (Social Determinants of Health) assessments and interventions performed: No  CCM Care Plan  No Known Allergies  Outpatient Encounter Medications as of 10/14/2021  Medication Sig Note   acetaminophen (TYLENOL) 500 MG tablet Take 1,000 mg by mouth daily as needed.    ALPRAZolam (XANAX) 0.5 MG tablet TAKE 1 TABLET(0.5 MG) BY MOUTH TWICE DAILY    APPLE CIDER VINEGAR PO Take 600 mg by mouth daily.     aspirin EC 81 MG tablet Take 81 mg by mouth daily. Takes at bedtime    beta carotene 25000 UNIT capsule Take 25,000 Units by mouth daily.    Cholecalciferol (VITAMIN D3) 250 MCG (10000 UT) capsule Take 10,000 Units by mouth daily.    Coenzyme Q10 (COQ10) 100 MG CAPS Take 100 mg by mouth daily. With cinnamon    CONTOUR NEXT TEST test strip CHECK BLOOD SUGAR BEFORE MEALS AND AT BEDTIME    Ferrous Sulfate Dried (HIGH POTENCY IRON) 65 MG TABS Take 1 tablet by mouth daily.    fluticasone (FLONASE) 50 MCG/ACT nasal spray Place 2  sprays into both nostrils daily.    furosemide (LASIX) 20 MG tablet TAKE 1 TABLET(20 MG) BY MOUTH DAILY (Patient taking differently: Take 20 mg by mouth daily. TAKE 1 TABLET(20 MG) BY MOUTH DAILY)    insulin aspart (NOVOLOG FLEXPEN) 100 UNIT/ML FlexPen Inject 5-10 Units into the skin 3 (three) times daily with meals. Per sliding scale    insulin glargine (LANTUS SOLOSTAR) 100 UNIT/ML Solostar Pen Inject 20 Units into the skin daily with breakfast.    Insulin Pen Needle 31G X 8 MM MISC Use to inject insulin 4-6 times daily as prescribed    levothyroxine (SYNTHROID) 100 MCG tablet TAKE 1 TABLET BY MOUTH DAILY BEFORE BREAKFAST    MAGNESIUM-ZINC PO Take 1 tablet by mouth daily.    Multiple Vitamins-Minerals (ABC PLUS SENIOR ADULTS 50+ PO) Take 1 tablet by mouth daily. 02/26/2021: Centrum Silver    omeprazole (PRILOSEC) 40 MG capsule TAKE 1 CAPSULE(40 MG) BY MOUTH DAILY    pregabalin (LYRICA) 225 MG capsule TAKE 1 CAPSULE(225 MG) BY MOUTH DAILY    rosuvastatin (CRESTOR) 5 MG tablet Take 1 tablet (5 mg total) by mouth daily.    No facility-administered encounter medications on file as of 10/14/2021.    Patient Active Problem List   Diagnosis Date Noted   Varicose veins of lower extremity with edema, bilateral 08/23/2021   Nasal congestion 08/23/2021   JVD (jugular venous distension) 08/23/2021   Venous insufficiency of both lower extremities 08/23/2021   Hyperlipidemia due to type 1  diabetes mellitus ([place]) 06/13/2021   Anemia 06/13/2021   Class 1 obesity with serious comorbidity and body mass index (BMI) of 31.0 to 31.9 in adult 06/13/2021   Spondylolisthesis of lumbar region 03/19/2021   Spondylolisthesis, lumbar region 03/18/2021   Lymphedema 10/30/2019   Type 1 diabetes mellitus with diabetic nephropathy ([place]) 09/01/2018   Type 1 diabetes mellitus with polyneuropathy ([place]) 09/01/2018   Hypertension associated with diabetes ([place]) 03/03/2018   Precordial pain 03/03/2018   Headache disorder  02/10/2018   Osteopenia 01/13/2018   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 ([place]) 08/28/2017   Hepatic cyst 08/28/2017   Tinnitus of right ear 08/28/2017   Hearing loss of right ear 08/28/2017   Constipation 08/28/2017   Background diabetic retinopathy ([place]) 05/22/2015   Hypothyroidism 10/10/2014   Diabetic hypoglycemia ([place]) 10/10/2014   Varicose veins of lower extremities with other complications 83/66/2947    Care Coordination/Medications:  Patient Care Plan: Diabetes Type 1 (Adult)     Problem Identified: Glycemic Management (Diabetes, Type 1)      Long-Range Goal: Glycemic Management Optimized   Start Date: 09/06/2021  Expected End Date: 12/05/2021  Priority: High  Note:   Objective:  Lab Results  Component Value Date   HGBA1C 7.6 (A) 06/13/2021    Lab Results  Component Value Date   HGBA1C 7.9 (H) 03/18/2021     Current Barriers:  Chronic Disease Management support and educational needs r/t Diabetes.  Case Manager Clinical Goal(s):  Over the next 90 days, patient will not require emergent care d/t complications r/t hypoglycemia. Over the next 90 days, patient will demonstrate adherence to prescribed treatment plan for Diabetes self management as evidenced by consistently assessing readings on her continuous glucose monitoring device, taking medications as prescribed and adhering to the recommended ADA/carb modified diet.   Interventions:  Collaboration with [name], MD, regarding development and update of comprehensive plan of care as evidenced by provider attestation and co-signature Inter-disciplinary care team collaboration (see longitudinal plan of care) Reviewed compliance with current treatment plan. Reports compliance with medications and insulin administration. Continues to utilize sliding scale insulin as advised. Reports some fluctuations with readings but notes most have been within  range. She is attempting to adhere to a diabetic/heart healthy diet. Her activity remains limited. She recalls an elevated readings in the 300's. Denies experiencing symptoms with elevated readings. Expressed concerns that some alarms may be inaccurate. Advised to continue using Dexcom G6 and recheck blood glucose levels with standard glucometer if concerned that readings/alarms are inaccurate. Reviewed established ranges. She is very knowledgeable of appropriate interventions and indications for notifying a provider. Update on 10/14/21: Ms. Guastella called regarding medications. Reports recently changing to [facility]. Requesting prescription [facility] from [facility] to [facility]. Reports blood glucose readings have been mostly in range. Feels elevated readings are d/t anxiety. She is currently out of alprazolam. Will forward message to provider regarding request to obtain [facility] at [facility].     Patient Goals/Self-Care Activities Self administer oral medications and insulin as prescribed Continue monitoring blood glucose levels Attend all scheduled provider appointments Adhere to prescribed ADA/carb modified Notify provider or care management team with questions and new concerns as needed   Follow Up Plan:  Will follow up in two months      [facility] [place] [phone]

## 2021-10-15 ENCOUNTER — Other Ambulatory Visit: Payer: Self-pay

## 2021-10-15 DIAGNOSIS — F411 Generalized anxiety disorder: Secondary | ICD-10-CM

## 2021-10-17 MED ORDER — ALPRAZOLAM 0.5 MG PO TABS: ORAL_TABLET | ORAL | 1 refills | Status: DC

## 2021-10-21 DIAGNOSIS — I152 Hypertension secondary to endocrine disorders: Secondary | ICD-10-CM | POA: Diagnosis not present

## 2021-10-21 DIAGNOSIS — E1059 Type 1 diabetes mellitus with other circulatory complications: Secondary | ICD-10-CM | POA: Diagnosis not present

## 2021-10-21 DIAGNOSIS — E103293 Type 1 diabetes mellitus with mild nonproliferative diabetic retinopathy without macular edema, bilateral: Secondary | ICD-10-CM | POA: Diagnosis not present

## 2021-10-30 DIAGNOSIS — M1712 Unilateral primary osteoarthritis, left knee: Secondary | ICD-10-CM | POA: Diagnosis not present

## 2021-10-30 DIAGNOSIS — S82831A Other fracture of upper and lower end of right fibula, initial encounter for closed fracture: Secondary | ICD-10-CM | POA: Diagnosis not present

## 2021-11-11 DIAGNOSIS — D2271 Melanocytic nevi of right lower limb, including hip: Secondary | ICD-10-CM | POA: Diagnosis not present

## 2021-11-11 DIAGNOSIS — D225 Melanocytic nevi of trunk: Secondary | ICD-10-CM | POA: Diagnosis not present

## 2021-11-11 DIAGNOSIS — D2262 Melanocytic nevi of left upper limb, including shoulder: Secondary | ICD-10-CM | POA: Diagnosis not present

## 2021-11-11 DIAGNOSIS — L570 Actinic keratosis: Secondary | ICD-10-CM | POA: Diagnosis not present

## 2021-11-11 DIAGNOSIS — X32XXXA Exposure to sunlight, initial encounter: Secondary | ICD-10-CM | POA: Diagnosis not present

## 2021-11-11 DIAGNOSIS — L821 Other seborrheic keratosis: Secondary | ICD-10-CM | POA: Diagnosis not present

## 2021-11-11 DIAGNOSIS — D2272 Melanocytic nevi of left lower limb, including hip: Secondary | ICD-10-CM | POA: Diagnosis not present

## 2021-11-11 DIAGNOSIS — D2261 Melanocytic nevi of right upper limb, including shoulder: Secondary | ICD-10-CM | POA: Diagnosis not present

## 2021-11-16 ENCOUNTER — Encounter: Payer: Self-pay | Admitting: Family Medicine

## 2021-11-16 DIAGNOSIS — E1140 Type 2 diabetes mellitus with diabetic neuropathy, unspecified: Secondary | ICD-10-CM

## 2021-11-16 DIAGNOSIS — Z794 Long term (current) use of insulin: Secondary | ICD-10-CM

## 2021-11-21 ENCOUNTER — Other Ambulatory Visit: Payer: Self-pay | Admitting: Family Medicine

## 2021-11-21 NOTE — Telephone Encounter (Signed)
Medication Refill - Medication: Crestor  generic ,rovastatin  Has the patient contacted their pharmacy? No.she changed pharmacy (Agent: If no, request that the patient contact the pharmacy for the refill. If patient does not wish to contact the pharmacy document the reason why and proceed with request.) (Agent: If yes, when and what did the pharmacy advise?)  Preferred Pharmacy (with phone number or street name): [facility] Has the patient been seen for an appointment in the last year OR does the patient have an upcoming appointment? Yes.    Agent: Please be advised that RX refills may take up to 3 business days. We ask that you follow-up with your pharmacy.

## 2021-11-22 MED ORDER — PREGABALIN 225 MG PO CAPS: ORAL_CAPSULE | ORAL | 1 refills | Status: DC

## 2021-11-22 MED ORDER — ROSUVASTATIN CALCIUM 5 MG PO TABS: 5.0000 mg | ORAL_TABLET | Freq: Every day | ORAL | 0 refills | Status: DC

## 2021-11-22 NOTE — Telephone Encounter (Signed)
Requested Prescriptions  Pending Prescriptions Disp Refills  . rosuvastatin (CRESTOR) 5 MG tablet 90 tablet 0    Sig: Take 1 tablet (5 mg total) by mouth daily.     Cardiovascular:  Antilipid - Statins Failed - 11/22/2021  9:09 AM      Failed - Total Cholesterol in normal range and within 360 days    Cholesterol, Total  Date Value Ref Range Status  01/28/2021 220 (H) 100 - 199 mg/dL Final         Failed - LDL in normal range and within 360 days    LDL Chol Calc (NIH)  Date Value Ref Range Status  01/28/2021 142 (H) 0 - 99 mg/dL Final         Passed - HDL in normal range and within 360 days    HDL  Date Value Ref Range Status  01/28/2021 57 >39 mg/dL Final         Passed - Triglycerides in normal range and within 360 days    Triglycerides  Date Value Ref Range Status  01/28/2021 116 0 - 149 mg/dL Final         Passed - Patient is not pregnant      Passed - Valid encounter within last 12 months    Recent Outpatient Visits          2 months ago Pain and swelling of right ankle   [facility] Jerrol Banana., MD   3 months ago Varicose veins of lower extremity with edema, bilateral   [facility] Joe T, FNP   5 months ago 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 [facility])   [facility], Dionne Bucy, MD   9 months ago 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 [facility])   [place], [place], PA-C   1 year ago 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 [facility]   [facility] [place], [place], [place]

## 2021-11-25 DIAGNOSIS — S82831A Other fracture of upper and lower end of right fibula, initial encounter for closed fracture: Secondary | ICD-10-CM | POA: Diagnosis not present

## 2021-11-28 ENCOUNTER — Telehealth: Payer: Self-pay

## 2021-11-28 NOTE — Progress Notes (Signed)
APPOINTMENT REMINDER  Karla Patton was reminded to have all medications, supplements and any blood glucose and blood pressure readings available for review with [name], Pharm. D, at her telephone visit on 11/29/2021 at 10:00 am.   Patient confirm appointment.  [place] Pharmacist Assistant [phone]

## 2021-11-29 ENCOUNTER — Ambulatory Visit (INDEPENDENT_AMBULATORY_CARE_PROVIDER_SITE_OTHER): Payer: Medicare Other

## 2021-11-29 DIAGNOSIS — E103293 Type 1 diabetes mellitus with mild nonproliferative diabetic retinopathy without macular edema, bilateral: Secondary | ICD-10-CM

## 2021-11-29 DIAGNOSIS — E785 Hyperlipidemia, unspecified: Secondary | ICD-10-CM

## 2021-11-29 NOTE — Progress Notes (Signed)
Chronic Care Management Pharmacy Note  12/19/2021 Name:  Karla Patton MRN:  [id number] DOB:  [birth year]   Summary: Patient presents for CCM follow-up. She has been using the Dexcom G6 sensors and has felt this has significantly helped her manage her diabetes. She has has a few instances of hypoglycemia, but sensor report indicates overall hypoglycemia time is at goal  Recommendations/Changes made from today's visit: Continue Current Medications Recheck Fasting Lipid Panel   Plan: CPP 3 month follow-up  Subjective: Karla Patton is an 70 y.o. year old female who is a primary patient of Bacigalupo, Dionne Bucy, MD.  The CCM team was consulted for assistance with disease management and care coordination needs.    Engaged with patient by telephone for follow up visit in response to provider referral for pharmacy case management and/or care coordination services.   Consent to Services:  The patient was given information about Chronic Care Management services, agreed to services, and gave verbal consent prior to initiation of services.  Please see initial visit note for detailed documentation.   Patient Care Team: Virginia Crews, MD as PCP - General (Family Medicine) Dasher, Rayvon Char, MD (Dermatology) Kem Parkinson, MD (Ophthalmology) Bary Castilla Forest Gleason, MD as Consulting Physician (General Surgery) Anabel Bene, MD as Referring Physician (Neurology) Germaine Pomfret, [facility] (Pharmacist) Neldon Labella, RN as Case Manager  Recent office visits: 09/04/21: Patient presented to Dr. Rosanna Randy for ankle pain.  08/23/21: Patient presented to Tally Joe, FNP for follow-up. Metolazone 5 mg daily PRN for edema. Flonase nasal spray. Patient referred to vein center.  06/13/21: Patient presented to Dr. Brita Romp for follow-up. A1c 7.6%. Rosuvastatin 5 mg daily.  01/28/2021: Patient presented to [name], PA-C for follow-up. A1c 6.8%. LDL 142. Augmentin, CoQ10, melatonin  stopped.   Recent consult visits: None in previous 6 months  Hospital visits: 03/18/21: Patient hospitalized for elective Lumbar surgery   Objective:  Lab Results  Component Value Date   CREATININE 0.69 09/04/2021   BUN 13 09/04/2021   GFR 91.51 03/01/2015   GFRNONAA >60 03/19/2021   GFRAA 95 01/28/2021   NA 134 09/04/2021   K 3.9 09/04/2021   CALCIUM 9.5 09/04/2021   CO2 29 09/04/2021    Lab Results  Component Value Date/Time   HGBA1C 7.6 (A) 06/13/2021 03:32 PM   HGBA1C 7.9 (H) 03/18/2021 09:37 AM   HGBA1C 6.8 (A) 01/28/2021 10:43 AM   HGBA1C 7.3 (H) 01/20/2020 11:31 AM   GFR 91.51 03/01/2015 09:55 AM   GFR 90.01 02/26/2015 02:20 PM   MICROALBUR negative 07/23/2020 10:03 AM   MICROALBUR neg 03/07/2019 11:58 AM    Last diabetic Eye exam:  Lab Results  Component Value Date/Time   HMDIABEYEEXA No Retinopathy 08/20/2020 12:00 AM    Last diabetic Foot exam:  Lab Results  Component Value Date/Time   HMDIABFOOTEX normal 10/10/2014 12:00 AM     Lab Results  Component Value Date   CHOL 220 (H) 01/28/2021   HDL 57 01/28/2021   LDLCALC 142 (H) 01/28/2021   TRIG 116 01/28/2021   CHOLHDL 3.4 01/20/2020    Hepatic Function Latest Ref Rng & Units 06/13/2021 01/28/2021 01/20/2020  Total Protein 6.0 - 8.5 g/dL 6.9 7.2 7.5  Albumin 3.8 - 4.8 g/dL 4.3 4.6 4.6  AST 0 - 40 IU/L 18 24 20   ALT 0 - 32 IU/L 17 18 20   Alk Phosphatase 44 - 121 IU/L 117 101 124(H)  Total Bilirubin 0.0 - 1.2 mg/dL 0.3 0.3  0.3    Lab Results  Component Value Date/Time   TSH 0.988 06/13/2021 04:18 PM   TSH 2.380 01/28/2021 11:29 AM    CBC Latest Ref Rng & Units 06/13/2021 03/19/2021 03/18/2021  WBC 3.4 - 10.8 x10E3/uL 7.4 9.8 -  Hemoglobin 11.1 - 15.9 g/dL 11.3 9.3(L) 10.9(L)  Hematocrit 34.0 - 46.6 % 34.5 28.1(L) 32.0(L)  Platelets 150 - 450 x10E3/uL 440 327 -    Lab Results  Component Value Date/Time   VD25OH 29.3 (L) 12/06/2018 09:50 AM   VD25OH 37.7 05/29/2017 10:46 AM    Clinical  ASCVD: No  The 10-year ASCVD risk score (Arnett DK, et al., 2019) is: 20.1%   Values used to calculate the score:     Age: 70 years     Sex: Female     Is Non-Hispanic African American: No     Diabetic: Yes     Tobacco smoker: No     Systolic Blood Pressure: 097 mmHg     Is BP treated: Yes     HDL Cholesterol: 57 mg/dL     Total Cholesterol: 220 mg/dL    Depression screen [facility] 2/9 02/21/2021 01/28/2021 10/19/2020  Decreased Interest 0 0 0  Down, Depressed, Hopeless 0 0 0  PHQ - 2 Score 0 0 0  Altered sleeping - 1 1  Tired, decreased energy - 0 1  Change in appetite - 0 1  Feeling bad or failure about yourself  - 0 0  Trouble concentrating - 0 0  Moving slowly or fidgety/restless - 0 0  Suicidal thoughts - 0 0  PHQ-9 Score - 1 3  Difficult doing work/chores - Not difficult at all Not difficult at all      Social History   Tobacco Use  Smoking Status Never  Smokeless Tobacco Never   BP Readings from Last 3 Encounters:  09/04/21 124/75  08/23/21 121/62  06/13/21 128/76   Pulse Readings from Last 3 Encounters:  09/04/21 68  08/23/21 78  06/13/21 97   Wt Readings from Last 3 Encounters:  08/23/21 189 lb 12.8 oz (86.1 kg)  06/13/21 187 lb 3.2 oz (84.9 kg)  03/18/21 194 lb (88 kg)   Last DEXA Scan: 01/13/18   T-Score femoral neck: -2.0  T-Score total hip: NA  T-Score lumbar spine: -1.5  T-Score forearm radius: NA  10-year probability of major osteoporotic fracture: 10.3%  10-year probability of hip fracture: 1.5%   Assessment/Interventions: Review of patient past medical history, allergies, medications, health status, including review of consultants reports, laboratory and other test data, was performed as part of comprehensive evaluation and provision of chronic care management services.   SDOH:  (Social Determinants of Health) assessments and interventions performed: Yes SDOH Interventions    Flowsheet Row Most Recent Value  SDOH Interventions   Financial Strain  Interventions Intervention Not Indicated          CCM Care Plan  No Known Allergies  Medications Reviewed Today     Reviewed by Neldon Labella, RN (Registered Nurse) on 10/07/21 at 1437  Med List Status: <None>   Medication Order Taking? Sig Documenting Provider Last Dose Status Informant  acetaminophen (TYLENOL) 500 MG tablet [id number] No Take 1,000 mg by mouth daily as needed. [provider] Taking Active Self  ALPRAZolam Duanne Moron) 0.5 MG tablet [id number] No TAKE 1 TABLET(0.5 MG) BY MOUTH TWICE DAILY Bacigalupo, Dionne Bucy, MD Taking Active   APPLE CIDER VINEGAR PO [id number] No Take 600 mg by mouth daily.  [provider] Taking Active Self  aspirin EC 81 MG tablet [id number] No Take 81 mg by mouth daily. Takes at bedtime [provider] Taking Active Self  beta carotene 25000 UNIT capsule [id number] No Take 25,000 Units by mouth daily. [provider] Taking Active Self  Cholecalciferol (VITAMIN D3) 250 MCG (10000 UT) capsule [id number] No Take 10,000 Units by mouth daily. [provider] Taking Active Self  Coenzyme Q10 (COQ10) 100 MG CAPS [id number] No Take 100 mg by mouth daily. With cinnamon [provider] Taking Active Self  CONTOUR NEXT TEST test strip [id number] No CHECK BLOOD SUGAR BEFORE MEALS AND AT BEDTIME Mar Daring, PA-C Taking Active Self  Ferrous Sulfate Dried (HIGH POTENCY IRON) 65 MG TABS [id number] No Take 1 tablet by mouth daily. [provider] Taking Active Self  fluticasone (FLONASE) 50 MCG/ACT nasal spray [id number] No Place 2 sprays into both nostrils daily. Gwyneth Sprout, FNP Taking Active   furosemide (LASIX) 20 MG tablet [id number] No TAKE 1 TABLET(20 MG) BY MOUTH DAILY  Patient taking differently: Take 20 mg by mouth daily. TAKE 1 TABLET(20 MG) BY MOUTH DAILY   Mar Daring, PA-C Taking Active   insulin aspart (NOVOLOG FLEXPEN) 100 UNIT/ML FlexPen [id number] No Inject 5-10 Units into  the skin 3 (three) times daily with meals. Per sliding scale [provider] Taking Active Self  insulin glargine (LANTUS SOLOSTAR) 100 UNIT/ML Solostar Pen [id number] No Inject 20 Units into the skin daily with breakfast. Virginia Crews, MD Taking Active   Insulin Pen Needle 31G X 8 MM MISC [id number] No Use to inject insulin 4-6 times daily as prescribed Virginia Crews, MD Taking Active   levothyroxine (SYNTHROID) 100 MCG tablet [id number]  TAKE 1 TABLET BY MOUTH DAILY BEFORE BREAKFAST Virginia Crews, MD  Active   MAGNESIUM-ZINC PO [id number] No Take 1 tablet by mouth daily. [provider] Taking Active Self  Multiple Vitamins-Minerals (ABC PLUS SENIOR ADULTS 50+ PO) [id number] No Take 1 tablet by mouth daily. [provider] Taking Active Self           Med Note Michaelle Birks, Vennie Salsbury A   Tue Feb 26, 2021  1:15 PM) Centrum Silver   omeprazole (PRILOSEC) 40 MG capsule [id number] No TAKE 1 CAPSULE(40 MG) BY MOUTH DAILY Bacigalupo, Dionne Bucy, MD Taking Active   pregabalin (LYRICA) 225 MG capsule [id number] No TAKE 1 CAPSULE(225 MG) BY MOUTH DAILY Bacigalupo, Dionne Bucy, MD Taking Active   rosuvastatin (CRESTOR) 5 MG tablet [id number] No Take 1 tablet (5 mg total) by mouth daily. Virginia Crews, MD Taking Active             Patient Active Problem List   Diagnosis Date Noted   Varicose veins of lower extremity with edema, bilateral 08/23/2021   Nasal congestion 08/23/2021   JVD (jugular venous distension) 08/23/2021   Venous insufficiency of both lower extremities 08/23/2021   Hyperlipidemia due to type 1 diabetes mellitus ([place]) 06/13/2021   Anemia 06/13/2021   Class 1 obesity with serious comorbidity and body mass index (BMI) of 31.0 to 31.9 in adult 06/13/2021   Spondylolisthesis of lumbar region 03/19/2021   Spondylolisthesis, lumbar region 03/18/2021   Lymphedema 10/30/2019   Type 1 diabetes mellitus with diabetic nephropathy ([place]) 09/01/2018    Type 1 diabetes mellitus with polyneuropathy ([place]) 09/01/2018   Hypertension associated with diabetes ([place]) 03/03/2018   Precordial pain 03/03/2018   Headache disorder 02/10/2018   Osteopenia 01/13/2018  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 (HCC) 08/28/2017   Hepatic cyst 08/28/2017   Tinnitus of right ear 08/28/2017   Hearing loss of right ear 08/28/2017   Constipation 08/28/2017   Background diabetic retinopathy ([place]) 05/22/2015   Hypothyroidism 10/10/2014   Diabetic hypoglycemia ([place]) 10/10/2014   Varicose veins of lower extremities with other complications 40/07/6760    Immunization History  Administered Date(s) Administered   Influenza Split 09/02/2014   Influenza, High Dose Seasonal PF 09/01/2018   Influenza,inj,Quad PF,6+ Mos 08/28/2017   Influenza-Unspecified 10/17/2016, 09/21/2019, 09/15/2021   PFIZER(Purple Top)SARS-COV-2 Vaccination 02/01/2020, 02/22/2020, 09/18/2020, 09/23/2021   Pneumococcal Conjugate-13 02/01/2014   Pneumococcal Polysaccharide-23 09/02/2014, 10/19/2019   Tdap 11/26/2016   Zoster, Live 02/28/2014    Conditions to be addressed/monitored:  Hypertension, Hyperlipidemia, Diabetes, Hypothyroidism and Osteopenia  Care Plan : General Pharmacy (Adult)  Updates made by Germaine Pomfret, [place] since 12/19/2021 12:00 AM     Problem: Hypertension, Hyperlipidemia, Diabetes, Hypothyroidism and Osteopenia   Priority: High     Long-Range Goal: Patient-Specific Goal   Start Date: 02/26/2021  Expected End Date: 09/20/2022  This Visit's Progress: On track  Recent Progress: On track  Priority: High  Note:   Current Barriers:  Unable to independently afford treatment regimen  Pharmacist Clinical Goal(s):  Over the next 90 days, patient will maintain control of diabetes as evidenced by A1c less than 8%  through collaboration with PharmD and provider.   Interventions: 1:1 collaboration with  Mar Daring, PA-C regarding development and update of comprehensive plan of care as evidenced by provider attestation and co-signature Inter-disciplinary care team collaboration (see longitudinal plan of care) Comprehensive medication review performed; medication list updated in electronic medical record  Hypertension (BP goal <140/90) -Controlled -Current treatment: Furosemide 20 mg daily  -Medications previously tried: Amlodipine, Lisinopril, Torsemide  -Current home readings: 131/71, pulse 81  -Current dietary habits: Patient tries to follow a low-carb, low sodium diet -Current exercise habits: Walking -Denies hypotensive/hypertensive symptoms -Educated on Daily salt intake goal < 2300 mg; Importance of home blood pressure monitoring; -Counseled to monitor BP at home weekly, document, and provide log at future appointments -Recommended to continue current medication  Hyperlipidemia: (LDL goal < 70) -Uncontrolled -Current treatment: Rosuvastatin 5 mg daily  -Medications previously tried: Atorvastatin 10 mg   -Educated on Importance of limiting foods high in cholesterol; -Counseled on diet and exercise extensively  Diabetes (A1c goal <8%) -Controlled -Current medications: Novolog up to 20 units three times daily  1 unit per 15 g carbs 1 unit for every 50 glucose > 200 mg/dL Lantus 20 units with breakfast    -Medications previously tried: NA    Target 5/28-6/10 8/27-9/9  Number of days worn ? 14 days 14 14  % of time active ? 70% 93% 93%  Mean Glucose (mg/dL)  159 173  GMI (2-week A1c estimate)  7.1% 7.4%  Standard Deviation (mg/dL)  53 60  Time above >250 mg/dL <10% 7% 12%  Time above 70-180 mg/dL <50% 23% 27%  Time in range: 70-180 mg/dL >50% 69% 59%  Time below 70 mg/dL <1% <1% 1%  -Reports hypoglycemic symptoms: 4x instances of lows overnight/early morning. Felt weak, shaky, sweaty.  -Continue current medications   Osteopenia (Goal Maintain bone density  and prevent fractures) -Controlled -Patient is not a candidate for pharmacologic treatment -Current treatment  Vitamin D 10,000 units   -Medications previously tried: NA  -Recommend [phone] units of vitamin D  daily. Recommend 1200 mg of calcium daily from dietary and supplemental sources. -Recommended to continue current medication  Hypothyroidism (Goal: maintain stable thyroid function) -Controlled -Current treatment  Levothyroxine 100 mcg daily before breakfast  -Medications previously tried: NA  -Recommended to continue current medication  GERD (Goal: Minimize symptoms of heartburn or reflux) -Controlled -Current treatment  Omeprazole 40 mg daily  -Medications previously tried: NA  -Patient reports significant reflux with single missed dose - Counseled on importance of trigger avoidance -Recommended to continue current medication  Patient Goals/Self-Care Activities Over the next 90 days, patient will:  - check glucose at 5-10 times daily , document, and provide at future appointments check blood pressure weekly, document, and provide at future appointments  Follow Up Plan: Telephone follow up appointment with care management team member scheduled for:  02/25/2022 at 3:45 PM         Medication Assistance: Application for Novolog, Lantus  medication assistance program. in process.  Anticipated assistance start date 04/20/2021.  See plan of care for additional detail.  Patient's preferred pharmacy is:  [id number] [address]Phone: [phone]: 3[place]Phone: [phone]: [phone]  Uses pill box? Yes Pt endorses 100% compliance  We discussed: Current pharmacy is preferred with insurance plan and patient is satisfied with pharmacy services Patient decided to:  Continue current medication management strategy  Care Plan and Follow Up Patient Decision:  Patient agrees to Care Plan and Follow-up.  Plan: Telephone follow up appointment with care management team member scheduled for:  02/25/2022 at 3:45 PM  AJunius Argyle PharmD, BPara March [phone]

## 2021-12-06 DIAGNOSIS — M25561 Pain in right knee: Secondary | ICD-10-CM | POA: Diagnosis not present

## 2021-12-06 DIAGNOSIS — M13861 Other specified arthritis, right knee: Secondary | ICD-10-CM | POA: Diagnosis not present

## 2021-12-06 DIAGNOSIS — M1711 Unilateral primary osteoarthritis, right knee: Secondary | ICD-10-CM | POA: Insufficient documentation

## 2021-12-17 ENCOUNTER — Other Ambulatory Visit: Payer: Self-pay | Admitting: Family Medicine

## 2021-12-17 DIAGNOSIS — K219 Gastro-esophageal reflux disease without esophagitis: Secondary | ICD-10-CM

## 2021-12-19 NOTE — Patient Instructions (Signed)
Visit Information It was great speaking with you today!  Please let me know if you have any questions about our visit.   Goals Addressed             This Visit's Progress    Monitor and Manage My Blood Sugar-Diabetes Type 1   On track    Timeframe:  Long-Range Goal Priority:  High Start Date: 02/26/2021                            Expected End Date: 08/29/2022                      Follow Up within 90 days    - check blood sugar at prescribed times - check blood sugar if I feel it is too high or too low - take the blood sugar meter to all doctor visits    Why is this important?   Checking your blood sugar at home helps to keep it from getting very high or very low.  Writing the results in a diary or log helps the doctor know how to care for you.  Your blood sugar log should have the time, the date and the results.  Also, write down the amount of insulin or other medicine you take.  Other information like what you ate, exercise done and how you were feeling will also be helpful..     Notes:        Patient Care Plan: General Pharmacy (Adult)     Problem Identified: Hypertension, Hyperlipidemia, Diabetes, Hypothyroidism and Osteopenia   Priority: High     Long-Range Goal: Patient-Specific Goal   Start Date: 02/26/2021  Expected End Date: 09/20/2022  This Visit's Progress: On track  Recent Progress: On track  Priority: High  Note:   Current Barriers:  Unable to independently afford treatment regimen  Pharmacist Clinical Goal(s):  Over the next 90 days, patient will maintain control of diabetes as evidenced by A1c less than 8%  through collaboration with PharmD and provider.   Interventions: 1:1 collaboration with Mar Daring, PA-C regarding development and update of comprehensive plan of care as evidenced by provider attestation and co-signature Inter-disciplinary care team collaboration (see longitudinal plan of care) Comprehensive medication review performed;  medication list updated in electronic medical record  Hypertension (BP goal <140/90) -Controlled -Current treatment: Furosemide 20 mg daily  -Medications previously tried: Amlodipine, Lisinopril, Torsemide  -Current home readings: 131/71, pulse 81  -Current dietary habits: Patient tries to follow a low-carb, low sodium diet -Current exercise habits: Walking -Denies hypotensive/hypertensive symptoms -Educated on Daily salt intake goal < 2300 mg; Importance of home blood pressure monitoring; -Counseled to monitor BP at home weekly, document, and provide log at future appointments -Recommended to continue current medication  Hyperlipidemia: (LDL goal < 70) -Uncontrolled -Current treatment: Rosuvastatin 5 mg daily  -Medications previously tried: Atorvastatin 10 mg   -Educated on Importance of limiting foods high in cholesterol; -Counseled on diet and exercise extensively  Diabetes (A1c goal <8%) -Controlled -Current medications: Novolog up to 20 units three times daily  1 unit per 15 g carbs 1 unit for every 50 glucose > 200 mg/dL Lantus 20 units with breakfast    -Medications previously tried: NA    Target 5/28-6/10 8/27-9/9  Number of days worn ? 14 days 14 14  % of time active ? 70% 93% 93%  Mean Glucose (mg/dL)  159 173  GMI (  2-week A1c estimate)  7.1% 7.4%  Standard Deviation (mg/dL)  53 60  Time above >250 mg/dL <10% 7% 12%  Time above 70-180 mg/dL <50% 23% 27%  Time in range: 70-180 mg/dL >50% 69% 59%  Time below 70 mg/dL <1% <1% 1%  -Reports hypoglycemic symptoms: 4x instances of lows overnight/early morning. Felt weak, shaky, sweaty.  -Continue current medications   Osteopenia (Goal Maintain bone density and prevent fractures) -Controlled -Patient is not a candidate for pharmacologic treatment -Current treatment  Vitamin D 10,000 units   -Medications previously tried: NA  -Recommend [phone] units of vitamin D daily. Recommend 1200 mg of calcium daily from  dietary and supplemental sources. -Recommended to continue current medication  Hypothyroidism (Goal: maintain stable thyroid function) -Controlled -Current treatment  Levothyroxine 100 mcg daily before breakfast  -Medications previously tried: NA  -Recommended to continue current medication  GERD (Goal: Minimize symptoms of heartburn or reflux) -Controlled -Current treatment  Omeprazole 40 mg daily  -Medications previously tried: NA  -Patient reports significant reflux with single missed dose - Counseled on importance of trigger avoidance -Recommended to continue current medication  Patient Goals/Self-Care Activities Over the next 90 days, patient will:  - check glucose at 5-10 times daily , document, and provide at future appointments check blood pressure weekly, document, and provide at future appointments  Follow Up Plan: Telephone follow up appointment with care management team member scheduled for:  02/25/2022 at 3:45 PM    Patient Care Plan: Fall Risk (Adult)     Problem Identified: Fall Risk      Long-Range Goal: Absence of Fall and Fall-Related Injury   Start Date: 09/06/2021  Expected End Date: 01/04/2022  Priority: High  Note:    Current Barriers:  Risk for Falls d/t Impaired Gait r/t Lumbar Fusion  Clinical Goal(s):  Over the next 90 days, patient will not experience falls and maintain an optimal level of function.   Interventions:  Collaboration with [name], MD regarding development and update of comprehensive plan of care as evidenced by provider attestation and co-signature Inter-disciplinary care team collaboration (see longitudinal plan of care) Reviewed fall and safety measures. Denies falls since last right leg injury. Reports following safety recommendations and adhering to activity restrictions. She is currently wearing a cast to her right lower leg. Denies pain. Continues using her rollator walker as a scoot chair while in the home.  Anticipates having her cast removed at her next Ortho visit. She will follow up with [facility] on 10/09/21.  Reviewed functional status. She continues to perform ADL's independently. Has very good support from family and friends. Denies current need for additional assistance.    Self-Care Deficits/Patient Goals:  Utilize assistive device appropriately with all ambulation Ensure pathways are clear and well lit Change positions slowly  Follow recommended lifting and activity restrictions Wear secure fitting, skid free footwear when ambulating Follow up [facility] as scheduled on 10/09/21   Follow Up Plan:  Will follow up in two months      Patient agreed to services and verbal consent obtained.   Patient verbalizes understanding of instructions provided today and agrees to view in [place].   [name], PharmD, Para March, CPP  Clinical Pharmacist Practitioner  [facility] [phone]

## 2021-12-24 DIAGNOSIS — M4316 Spondylolisthesis, lumbar region: Secondary | ICD-10-CM | POA: Diagnosis not present

## 2021-12-24 DIAGNOSIS — Z981 Arthrodesis status: Secondary | ICD-10-CM | POA: Diagnosis not present

## 2021-12-25 DIAGNOSIS — M25561 Pain in right knee: Secondary | ICD-10-CM | POA: Diagnosis not present

## 2022-01-02 DIAGNOSIS — M5387 Other specified dorsopathies, lumbosacral region: Secondary | ICD-10-CM | POA: Diagnosis not present

## 2022-01-02 DIAGNOSIS — M9904 Segmental and somatic dysfunction of sacral region: Secondary | ICD-10-CM | POA: Diagnosis not present

## 2022-01-02 DIAGNOSIS — M9901 Segmental and somatic dysfunction of cervical region: Secondary | ICD-10-CM | POA: Diagnosis not present

## 2022-01-02 DIAGNOSIS — M5136 Other intervertebral disc degeneration, lumbar region: Secondary | ICD-10-CM | POA: Diagnosis not present

## 2022-01-02 DIAGNOSIS — M9903 Segmental and somatic dysfunction of lumbar region: Secondary | ICD-10-CM | POA: Diagnosis not present

## 2022-01-02 DIAGNOSIS — M9902 Segmental and somatic dysfunction of thoracic region: Secondary | ICD-10-CM | POA: Diagnosis not present

## 2022-01-02 DIAGNOSIS — M5383 Other specified dorsopathies, cervicothoracic region: Secondary | ICD-10-CM | POA: Diagnosis not present

## 2022-01-03 DIAGNOSIS — E109 Type 1 diabetes mellitus without complications: Secondary | ICD-10-CM | POA: Diagnosis not present

## 2022-01-03 LAB — HM DIABETES EYE EXAM

## 2022-01-06 ENCOUNTER — Telehealth: Payer: Self-pay

## 2022-01-06 DIAGNOSIS — E103293 Type 1 diabetes mellitus with mild nonproliferative diabetic retinopathy without macular edema, bilateral: Secondary | ICD-10-CM

## 2022-01-06 DIAGNOSIS — E1069 Type 1 diabetes mellitus with other specified complication: Secondary | ICD-10-CM

## 2022-01-06 DIAGNOSIS — E1059 Type 1 diabetes mellitus with other circulatory complications: Secondary | ICD-10-CM

## 2022-01-06 DIAGNOSIS — E039 Hypothyroidism, unspecified: Secondary | ICD-10-CM

## 2022-01-06 DIAGNOSIS — E785 Hyperlipidemia, unspecified: Secondary | ICD-10-CM

## 2022-01-06 NOTE — Telephone Encounter (Signed)
Copied from [place] [id number]. Topic: General - Other >> Jan 06, 2022  9:12 AM McGill, Karla Patton wrote: Reason for CRM: Pt stated she would like to schedule her labs before her AWV on 01/13/2022. Pt stated she wants to have them done so she can review with PCP. Pt stated she does not want a nurse to call her back about her labs, she wants to discuss with her PCP at her appointment.

## 2022-01-06 NOTE — Telephone Encounter (Signed)
Lab order done. Patient advised.

## 2022-01-06 NOTE — Telephone Encounter (Signed)
Ok. She'll need to have them drawn at least 2 days before appt. Ok to order: CMP, lipids, A1c, TSH please.

## 2022-01-06 NOTE — Addendum Note (Signed)
Addended by: Shawna Orleans on: 01/06/2022 11:29 AM   Modules accepted: Orders

## 2022-01-07 DIAGNOSIS — E785 Hyperlipidemia, unspecified: Secondary | ICD-10-CM | POA: Diagnosis not present

## 2022-01-07 DIAGNOSIS — E1069 Type 1 diabetes mellitus with other specified complication: Secondary | ICD-10-CM | POA: Diagnosis not present

## 2022-01-07 DIAGNOSIS — E103293 Type 1 diabetes mellitus with mild nonproliferative diabetic retinopathy without macular edema, bilateral: Secondary | ICD-10-CM | POA: Diagnosis not present

## 2022-01-07 DIAGNOSIS — E1059 Type 1 diabetes mellitus with other circulatory complications: Secondary | ICD-10-CM | POA: Diagnosis not present

## 2022-01-07 DIAGNOSIS — E039 Hypothyroidism, unspecified: Secondary | ICD-10-CM | POA: Diagnosis not present

## 2022-01-07 DIAGNOSIS — I152 Hypertension secondary to endocrine disorders: Secondary | ICD-10-CM | POA: Diagnosis not present

## 2022-01-08 LAB — TSH: TSH: 1.45 u[IU]/mL (ref 0.450–4.500)

## 2022-01-08 LAB — COMPREHENSIVE METABOLIC PANEL
ALT: 24 IU/L (ref 0–32)
AST: 20 IU/L (ref 0–40)
Albumin/Globulin Ratio: 1.6 (ref 1.2–2.2)
Albumin: 4.7 g/dL (ref 3.8–4.8)
Alkaline Phosphatase: 123 IU/L — ABNORMAL HIGH (ref 44–121)
BUN/Creatinine Ratio: 16 (ref 12–28)
BUN: 13 mg/dL (ref 8–27)
Bilirubin Total: 0.5 mg/dL (ref 0.0–1.2)
CO2: 29 mmol/L (ref 20–29)
Calcium: 10 mg/dL (ref 8.7–10.3)
Chloride: 92 mmol/L — ABNORMAL LOW (ref 96–106)
Creatinine, Ser: 0.82 mg/dL (ref 0.57–1.00)
Globulin, Total: 2.9 g/dL (ref 1.5–4.5)
Glucose: 101 mg/dL — ABNORMAL HIGH (ref 70–99)
Potassium: 4.8 mmol/L (ref 3.5–5.2)
Sodium: 131 mmol/L — ABNORMAL LOW (ref 134–144)
Total Protein: 7.6 g/dL (ref 6.0–8.5)
eGFR: 77 mL/min/{1.73_m2} (ref >59)

## 2022-01-08 LAB — LIPID PANEL WITH LDL/HDL RATIO
Cholesterol, Total: 200 mg/dL — ABNORMAL HIGH (ref 100–199)
HDL: 78 mg/dL (ref >39)
LDL Chol Calc (NIH): 108 mg/dL — ABNORMAL HIGH (ref 0–99)
LDL/HDL Ratio: 1.4 ratio (ref 0.0–3.2)
Triglycerides: 77 mg/dL (ref 0–149)
VLDL Cholesterol Cal: 14 mg/dL (ref 5–40)

## 2022-01-08 LAB — HEMOGLOBIN A1C
Est. average glucose Bld gHb Est-mCnc: 169 mg/dL
Hgb A1c MFr Bld: 7.5 % — ABNORMAL HIGH (ref 4.8–5.6)

## 2022-01-10 NOTE — Progress Notes (Signed)
Annual Wellness Visit     Patient: Karla Patton, Female    DOB: 26-Dec-1950, 71 y.o.   MRN: [id number] Visit Date: 01/13/2022  Today's Provider: [name], MD   Chief Complaint  Patient presents with   Annual Exam   Subjective    Karla Patton is a 71 y.o. female who presents today for her Annual Wellness Visit.  She generally feels fairly well. She reports sleeping well. She does not have additional problems to discuss today.   HPI Pap: 02/01/2020 Mammogram:06/07/21 Bone Density:02/02/20 Colonoscopy:10/20/18  Medications: Outpatient Medications Prior to Visit  Medication Sig   acetaminophen (TYLENOL) 650 MG CR tablet Take 650 mg by mouth every 8 (eight) hours as needed for pain.   ALPRAZolam (XANAX) 0.5 MG tablet TAKE 1 TABLET(0.5 MG) BY MOUTH TWICE DAILY prn   APPLE CIDER VINEGAR PO Take 600 mg by mouth daily.    aspirin EC 81 MG tablet Take 81 mg by mouth daily. Takes at bedtime   beta carotene 25000 UNIT capsule Take 25,000 Units by mouth daily.   Cholecalciferol (VITAMIN D3) 250 MCG (10000 UT) capsule Take 10,000 Units by mouth daily.   CONTOUR NEXT TEST test strip CHECK BLOOD SUGAR BEFORE MEALS AND AT BEDTIME   Ferrous Sulfate Dried (HIGH POTENCY IRON) 65 MG TABS Take 1 tablet by mouth daily.   fluticasone (FLONASE) 50 MCG/ACT nasal spray Place 2 sprays into both nostrils daily.   furosemide (LASIX) 20 MG tablet TAKE 1 TABLET(20 MG) BY MOUTH DAILY (Patient taking differently: Take 20 mg by mouth daily. TAKE 1 TABLET(20 MG) BY MOUTH DAILY)   insulin aspart (NOVOLOG FLEXPEN) 100 UNIT/ML FlexPen Inject 5-10 Units into the skin 3 (three) times daily with meals. Per sliding scale   insulin glargine (LANTUS SOLOSTAR) 100 UNIT/ML Solostar Pen Inject 20 Units into the skin daily with breakfast.   Insulin Pen Needle 31G X 8 MM MISC Use to inject insulin 4-6 times daily as prescribed   levothyroxine (SYNTHROID) 100 MCG tablet TAKE 1 TABLET BY MOUTH DAILY BEFORE  BREAKFAST   MAGNESIUM-ZINC PO Take 1 tablet by mouth daily.   Multiple Vitamins-Minerals (ABC PLUS SENIOR ADULTS 50+ PO) Take 1 tablet by mouth daily.   omeprazole (PRILOSEC) 40 MG capsule TAKE 1 CAPSULE(40 MG) BY MOUTH DAILY   pregabalin (LYRICA) 225 MG capsule TAKE 1 CAPSULE(225 MG) BY MOUTH DAILY   rosuvastatin (CRESTOR) 5 MG tablet Take 1 tablet (5 mg total) by mouth daily.   [DISCONTINUED] acetaminophen (TYLENOL) 500 MG tablet Take 1,000 mg by mouth daily as needed.   [DISCONTINUED] Coenzyme Q10 (COQ10) 100 MG CAPS Take 100 mg by mouth daily. With cinnamon   No facility-administered medications prior to visit.    No Known Allergies  Patient Care Team: Mikey Kirschner, PA-C as PCP - General (Physician Assistant) Dasher, Rayvon Char, MD (Dermatology) Kem Parkinson, MD (Ophthalmology) Bary Castilla Forest Gleason, MD as Consulting Physician (General Surgery) Anabel Bene, MD as Referring Physician (Neurology) Germaine Pomfret, [facility] (Pharmacist) Neldon Labella, RN as Case Manager  Review of Systems  Constitutional:  Positive for activity change and fatigue.  HENT:  Positive for postnasal drip and sneezing.   Eyes:  Positive for redness and itching.  Respiratory:  Positive for cough.   Cardiovascular:  Positive for palpitations and leg swelling.  Gastrointestinal: Negative.   Endocrine: Positive for cold intolerance.  Genitourinary: Negative.   Musculoskeletal:  Positive for arthralgias and back pain.  Skin: Negative.   Allergic/Immunologic:  Positive for environmental allergies.  Neurological:  Positive for light-headedness.  Hematological: Negative.   Psychiatric/Behavioral: Negative.          Objective    Vitals: BP (!) 166/84 (BP Location: Right Arm, Patient Position: Sitting, Cuff Size: Normal)    Pulse 90    Temp 98.2 F (36.8 C) (Oral)    Wt 181 lb (82.1 kg)    SpO2 98%    BMI 30.12 kg/m  Vitals:   01/13/22 1027  BP: (!) 166/84  Pulse: 90  Temp: 98.2 F (36.8 C)   TempSrc: Oral  SpO2: 98%  Weight: 181 lb (82.1 kg)      Physical Exam Constitutional:      Appearance: Normal appearance. She is normal weight.  HENT:     Head: Normocephalic and atraumatic.     Right Ear: Tympanic membrane, ear canal and external ear normal.     Left Ear: Tympanic membrane, ear canal and external ear normal.     Nose: Nose normal.     Mouth/Throat:     Mouth: Mucous membranes are moist.     Pharynx: Oropharynx is clear.  Eyes:     Extraocular Movements: Extraocular movements intact.     Conjunctiva/sclera: Conjunctivae normal.     Pupils: Pupils are equal, round, and reactive to light.  Cardiovascular:     Rate and Rhythm: Normal rate and regular rhythm.     Pulses: Normal pulses.     Heart sounds: Normal heart sounds.  Pulmonary:     Effort: Pulmonary effort is normal.     Breath sounds: Normal breath sounds.  Abdominal:     General: Abdomen is flat. Bowel sounds are normal.     Palpations: Abdomen is soft.  Musculoskeletal:        General: Normal range of motion.     Cervical back: Normal range of motion and neck supple.  Skin:    General: Skin is warm and dry.  Neurological:     General: No focal deficit present.     Mental Status: She is alert and oriented to person, place, and time. Mental status is at baseline.  Psychiatric:        Mood and Affect: Mood normal.        Behavior: Behavior normal.        Thought Content: Thought content normal.        Judgment: Judgment normal.     Most recent functional status assessment: In your present state of health, do you have any difficulty performing the following activities: 01/13/2022  Hearing? N  Vision? N  Difficulty concentrating or making decisions? N  Walking or climbing stairs? N  Dressing or bathing? N  Doing errands, shopping? N  Some recent data might be hidden   Most recent fall risk assessment: Fall Risk  01/13/2022  Falls in the past year? 0  Comment -  Number falls in past yr: 0   Injury with Fall? 1  Risk for fall due to : -  Risk for fall due to: Comment -  Follow up -    Most recent depression screenings: PHQ 2/9 Scores 01/13/2022 02/21/2021  PHQ - 2 Score 0 0  PHQ- 9 Score 0 -   Most recent cognitive screening: 6CIT Screen 04/24/2020  What Year? 0 points  What month? 0 points  What time? 0 points  Count back from 20 0 points  Months in reverse 0 points  Repeat phrase 0 points  Total Score  0   Most recent Audit-C alcohol use screening Alcohol Use Disorder Test (AUDIT) 01/13/2022  1. How often do you have a drink containing alcohol? 4  2. How many drinks containing alcohol do you have on a typical day when you are drinking? 0  3. How often do you have six or more drinks on one occasion? 0  AUDIT-C Score 4  4. How often during the last year have you found that you were not able to stop drinking once you had started? -  5. How often during the last year have you failed to do what was normally expected from you because of drinking? -  6. How often during the last year have you needed a first drink in the morning to get yourself going after a heavy drinking session? -  7. How often during the last year have you had a feeling of guilt of remorse after drinking? -  8. How often during the last year have you been unable to remember what happened the night before because you had been drinking? -  9. Have you or someone else been injured as a result of your drinking? -  10. Has a relative or friend or a doctor or another health worker been concerned about your drinking or suggested you cut down? -  Alcohol Use Disorder Identification Test Final Score (AUDIT) -  Alcohol Brief Interventions/Follow-up -   A score of 3 or more in women, and 4 or more in men indicates increased risk for alcohol abuse, EXCEPT if all of the points are from question 1   No results found for any visits on 01/13/22.  Assessment & Plan     Annual wellness visit done today including the all  of the following: Reviewed patient's Family Medical History Reviewed and updated list of patient's medical providers Assessment of cognitive impairment was done Assessed patient's functional ability Established a written schedule for health screening [place] Completed and Reviewed  Exercise Activities and Dietary recommendations  Goals      DIET - INCREASE WATER INTAKE     Recommend to drink at least 6-8 8oz glasses of water per day.     Monitor and Manage My Blood Sugar-Diabetes Type 1     Timeframe:  Long-Range Goal Priority:  High Start Date: 02/26/2021                            Expected End Date: 08/29/2022                      Follow Up within 90 days    - check blood sugar at prescribed times - check blood sugar if I feel it is too high or too low - take the blood sugar meter to all doctor visits    Why is this important?   Checking your blood sugar at home helps to keep it from getting very high or very low.  Writing the results in a diary or log helps the doctor know how to care for you.  Your blood sugar log should have the time, the date and the results.  Also, write down the amount of insulin or other medicine you take.  Other information like what you ate, exercise done and how you were feeling will also be helpful..     Notes:         Immunization History  Administered Date(s) Administered  Influenza Split 09/02/2014   Influenza, High Dose Seasonal PF 09/01/2018   Influenza,inj,Quad PF,6+ Mos 08/28/2017   Influenza-Unspecified 10/17/2016, 09/21/2019, 09/15/2021   PFIZER(Purple Top)SARS-COV-2 Vaccination 02/01/2020, 02/22/2020, 09/18/2020, 09/23/2021   Pneumococcal Conjugate-13 02/01/2014   Pneumococcal Polysaccharide-23 09/02/2014, 10/19/2019   Tdap 11/26/2016   Zoster, Live 02/28/2014    Health Maintenance  Topic Date Due   Zoster Vaccines- Shingrix (1 of 2) Never done   URINE MICROALBUMIN  07/23/2021   OPHTHALMOLOGY EXAM   08/20/2021   COVID-19 Vaccine (5 - Booster for Pfizer series) 11/18/2021   FOOT EXAM  06/13/2022   HEMOGLOBIN A1C  07/07/2022   DEXA SCAN  01/13/2023   MAMMOGRAM  06/07/2023   TETANUS/TDAP  11/26/2026   COLONOSCOPY (Pts 45-28yrs Insurance coverage will need to be confirmed)  10/20/2028   Pneumonia Vaccine 22+ Years old  Completed   INFLUENZA VACCINE  Completed   Hepatitis C Screening  Completed   HPV VACCINES  Aged Out     Discussed health benefits of physical activity, and encouraged her to engage in regular exercise appropriate for her age and condition.    Problem List Items Addressed This Visit       Cardiovascular and Mediastinum   Hypertension associated with diabetes ([place])    Uncontrolled Will add lisinopril 10 mg daily Recheck BMP in 2 wks Did not tolerate metolazone      Relevant Medications   lisinopril (ZESTRIL) 10 MG tablet   Other Relevant Orders   Basic Metabolic Panel (BMET)     Endocrine   Hypothyroidism    Well controlled on recent TSH Will continue synthroid at current dose      Background diabetic retinopathy ([place])    Recent eye exam - will send ROI      Relevant Medications   lisinopril (ZESTRIL) 10 MG tablet   Other Relevant Orders   Ambulatory referral to Endocrinology   Type 1 diabetes mellitus with polyneuropathy (HCC)    Chronic and uncontrolled a1c 7.5 continue insulin Will be establishing with Endocrinology UTD on screening      Relevant Medications   lisinopril (ZESTRIL) 10 MG tablet   Other Relevant Orders   Urine Microalbumin w/creat. ratio   Ambulatory referral to Endocrinology   Hyperlipidemia due to type 1 diabetes mellitus ([place])    Reviewed last lipid panel Doing well on crestor - will continue      Relevant Medications   lisinopril (ZESTRIL) 10 MG tablet     Other   Obesity    Discussed importance of healthy weight management Discussed diet and exercise       Other Visit Diagnoses     Encounter for annual  wellness visit (AWV) in Medicare patient    -  Primary        Return in about 6 weeks (around 02/24/2022) for With new PCP, BP f/u.      Ar[place] Ponder [place],acting as a scribe for [name], MD.,have documented all relevant documentation on the behalf of [name], MD,as directed by  [name], MD while in the presence of [name], MD.  I, [name], MD, have reviewed all documentation for this visit. The documentation on 01/13/22 for the exam, diagnosis, procedures, and orders are all accurate and complete.   Trumaine Wimer, Dionne Bucy, MD, MPH [facility]

## 2022-01-13 ENCOUNTER — Encounter: Payer: Self-pay | Admitting: Physician Assistant

## 2022-01-13 ENCOUNTER — Ambulatory Visit (INDEPENDENT_AMBULATORY_CARE_PROVIDER_SITE_OTHER): Payer: Medicare Other | Admitting: Family Medicine

## 2022-01-13 ENCOUNTER — Other Ambulatory Visit: Payer: Self-pay

## 2022-01-13 ENCOUNTER — Encounter: Payer: Self-pay | Admitting: Family Medicine

## 2022-01-13 VITALS — BP 140/85 | HR 90 | Temp 98.2°F | Wt 181.0 lb

## 2022-01-13 DIAGNOSIS — E1159 Type 2 diabetes mellitus with other circulatory complications: Secondary | ICD-10-CM

## 2022-01-13 DIAGNOSIS — Z6830 Body mass index (BMI) 30.0-30.9, adult: Secondary | ICD-10-CM | POA: Diagnosis not present

## 2022-01-13 DIAGNOSIS — E785 Hyperlipidemia, unspecified: Secondary | ICD-10-CM

## 2022-01-13 DIAGNOSIS — E669 Obesity, unspecified: Secondary | ICD-10-CM | POA: Diagnosis not present

## 2022-01-13 DIAGNOSIS — E113299 Type 2 diabetes mellitus with mild nonproliferative diabetic retinopathy without macular edema, unspecified eye: Secondary | ICD-10-CM

## 2022-01-13 DIAGNOSIS — E66811 Obesity, class 1: Secondary | ICD-10-CM

## 2022-01-13 DIAGNOSIS — E1042 Type 1 diabetes mellitus with diabetic polyneuropathy: Secondary | ICD-10-CM | POA: Diagnosis not present

## 2022-01-13 DIAGNOSIS — Z0000 Encounter for general adult medical examination without abnormal findings: Secondary | ICD-10-CM | POA: Diagnosis not present

## 2022-01-13 DIAGNOSIS — E1069 Type 1 diabetes mellitus with other specified complication: Secondary | ICD-10-CM

## 2022-01-13 DIAGNOSIS — I152 Hypertension secondary to endocrine disorders: Secondary | ICD-10-CM | POA: Diagnosis not present

## 2022-01-13 DIAGNOSIS — E039 Hypothyroidism, unspecified: Secondary | ICD-10-CM | POA: Diagnosis not present

## 2022-01-13 MED ORDER — LISINOPRIL 10 MG PO TABS: 10.0000 mg | ORAL_TABLET | Freq: Every day | ORAL | 3 refills | Status: DC

## 2022-01-13 NOTE — Patient Instructions (Signed)
   The CDC recommends two doses of Shingrix (the shingles vaccine) separated by 2 to 6 months for adults age 70 years and older. I recommend checking with your insurance plan regarding coverage for this vaccine.   

## 2022-01-13 NOTE — Assessment & Plan Note (Signed)
Reviewed last lipid panel Doing well on crestor - will continue

## 2022-01-13 NOTE — Assessment & Plan Note (Signed)
Uncontrolled Will add lisinopril 10 mg daily Recheck BMP in 2 wks Did not tolerate metolazone

## 2022-01-13 NOTE — Assessment & Plan Note (Signed)
Discussed importance of healthy weight management Discussed diet and exercise  

## 2022-01-13 NOTE — Assessment & Plan Note (Signed)
Well controlled on recent TSH Will continue synthroid at current dose

## 2022-01-13 NOTE — Assessment & Plan Note (Signed)
Chronic and uncontrolled a1c 7.5 continue insulin Will be establishing with Endocrinology UTD on screening

## 2022-01-13 NOTE — Assessment & Plan Note (Signed)
Recent eye exam - will send ROI

## 2022-01-14 ENCOUNTER — Telehealth: Payer: Self-pay

## 2022-01-14 LAB — SPECIMEN STATUS REPORT

## 2022-01-14 LAB — MICROALBUMIN / CREATININE URINE RATIO
Creatinine, Urine: 10.1 mg/dL
Microalb/Creat Ratio: <30 mg/g creat (ref 0–29)
Microalbumin, Urine: <3 ug/mL

## 2022-01-14 NOTE — Telephone Encounter (Signed)
Copied from [place] [phone]. Topic: General - Other >> Jan 14, 2022 11:34 AM Karla Patton wrote: Reason for CRM: pt called stating that she is returning a call. Pt says that she was unable to answer, pt says she received a call from Judson Roch asking her to return call. Pt is unsure of what she is calling for. Please assist pt further.

## 2022-01-24 ENCOUNTER — Telehealth: Payer: Self-pay

## 2022-01-24 NOTE — Telephone Encounter (Signed)
Copied from [place] [phone]. Topic: Referral - Status >> Jan 23, 2022  2:53 PM Yvette Rack wrote: Reason for CRM: Pt stated she has not heard from anyone regarding the referral to Endocrinology

## 2022-01-27 DIAGNOSIS — M9902 Segmental and somatic dysfunction of thoracic region: Secondary | ICD-10-CM | POA: Diagnosis not present

## 2022-01-27 DIAGNOSIS — M5387 Other specified dorsopathies, lumbosacral region: Secondary | ICD-10-CM | POA: Diagnosis not present

## 2022-01-27 DIAGNOSIS — M9904 Segmental and somatic dysfunction of sacral region: Secondary | ICD-10-CM | POA: Diagnosis not present

## 2022-01-27 DIAGNOSIS — I152 Hypertension secondary to endocrine disorders: Secondary | ICD-10-CM | POA: Diagnosis not present

## 2022-01-27 DIAGNOSIS — M9903 Segmental and somatic dysfunction of lumbar region: Secondary | ICD-10-CM | POA: Diagnosis not present

## 2022-01-27 DIAGNOSIS — M9901 Segmental and somatic dysfunction of cervical region: Secondary | ICD-10-CM | POA: Diagnosis not present

## 2022-01-27 DIAGNOSIS — M5136 Other intervertebral disc degeneration, lumbar region: Secondary | ICD-10-CM | POA: Diagnosis not present

## 2022-01-27 DIAGNOSIS — M5383 Other specified dorsopathies, cervicothoracic region: Secondary | ICD-10-CM | POA: Diagnosis not present

## 2022-01-27 DIAGNOSIS — E1159 Type 2 diabetes mellitus with other circulatory complications: Secondary | ICD-10-CM | POA: Diagnosis not present

## 2022-01-28 LAB — BASIC METABOLIC PANEL
BUN/Creatinine Ratio: 16 (ref 12–28)
BUN: 11 mg/dL (ref 8–27)
CO2: 26 mmol/L (ref 20–29)
Calcium: 9.5 mg/dL (ref 8.7–10.3)
Chloride: 89 mmol/L — ABNORMAL LOW (ref 96–106)
Creatinine, Ser: 0.69 mg/dL (ref 0.57–1.00)
Glucose: 152 mg/dL — ABNORMAL HIGH (ref 70–99)
Potassium: 5.2 mmol/L (ref 3.5–5.2)
Sodium: 130 mmol/L — ABNORMAL LOW (ref 134–144)
eGFR: 94 mL/min/{1.73_m2} (ref >59)

## 2022-02-04 ENCOUNTER — Telehealth: Payer: Self-pay

## 2022-02-04 NOTE — Progress Notes (Signed)
Per Clinical pharmacist,please return patient call.  Patient states she has had her Dexcom for over 2 years , and she has miss place her Dexcom receivers.Patient states she has reach everyone she knows  to try to get a replacement for her Dexcom receivers with no luck. Patient is asking if the clinical pharmacist could assist with replacing her Dexcom receiver.Notified Clinical pharmacist.  Patient states never mind as her husband just found her Dexcom receivers in her car.  Per Clinical pharmacist,patient could also try contacting [phone] in the future to see about getting a replacement if needed.   [place] Pharmacist Assistant [phone]

## 2022-02-04 NOTE — Telephone Encounter (Signed)
Copied from [place] [phone]. Topic: General - Other >> Feb 04, 2022  2:16 PM Valere Dross wrote: Reason for CRM: Pt called in stating she needs to speak with Cristie Hem about some supply issues she is having, please advise.

## 2022-02-04 NOTE — Telephone Encounter (Signed)
Per health concierge, patient lost her Dexcom receivers, but found them in her in car. No further action necessary.   [name], PharmD, [place], CPP  Clinical Pharmacist Practitioner  [facility] [phone]

## 2022-02-11 DIAGNOSIS — M9903 Segmental and somatic dysfunction of lumbar region: Secondary | ICD-10-CM | POA: Diagnosis not present

## 2022-02-11 DIAGNOSIS — M9904 Segmental and somatic dysfunction of sacral region: Secondary | ICD-10-CM | POA: Diagnosis not present

## 2022-02-11 DIAGNOSIS — M5383 Other specified dorsopathies, cervicothoracic region: Secondary | ICD-10-CM | POA: Diagnosis not present

## 2022-02-11 DIAGNOSIS — M5136 Other intervertebral disc degeneration, lumbar region: Secondary | ICD-10-CM | POA: Diagnosis not present

## 2022-02-11 DIAGNOSIS — M9902 Segmental and somatic dysfunction of thoracic region: Secondary | ICD-10-CM | POA: Diagnosis not present

## 2022-02-11 DIAGNOSIS — M9901 Segmental and somatic dysfunction of cervical region: Secondary | ICD-10-CM | POA: Diagnosis not present

## 2022-02-11 DIAGNOSIS — M5387 Other specified dorsopathies, lumbosacral region: Secondary | ICD-10-CM | POA: Diagnosis not present

## 2022-02-17 ENCOUNTER — Other Ambulatory Visit: Payer: Self-pay | Admitting: Family Medicine

## 2022-02-17 DIAGNOSIS — F411 Generalized anxiety disorder: Secondary | ICD-10-CM

## 2022-02-18 NOTE — Telephone Encounter (Signed)
pmdp checked last fill 12/17/21. By her fill pattern it appears she takes one daily as a #60 rx lasts her two months. The dx code associated with this is GAD but this has NEVER been addressed in an office visit since patient was established in 2016/17. Her xanax use for anxiety has not been addressed in years if ever at [facility] from my chart review. I find it highly inappropriate to fill a full #60 rx without discussing this with the patient, I will fill 1 pill daily with some extra in case of scheduling conflicts based on her refill habits until our next follow up visit 3/7 where we will discuss.

## 2022-02-19 ENCOUNTER — Other Ambulatory Visit (INDEPENDENT_AMBULATORY_CARE_PROVIDER_SITE_OTHER): Payer: Self-pay | Admitting: Nurse Practitioner

## 2022-02-19 DIAGNOSIS — I83893 Varicose veins of bilateral lower extremities with other complications: Secondary | ICD-10-CM

## 2022-02-20 ENCOUNTER — Other Ambulatory Visit: Payer: Self-pay

## 2022-02-20 ENCOUNTER — Encounter (INDEPENDENT_AMBULATORY_CARE_PROVIDER_SITE_OTHER): Payer: Self-pay | Admitting: Nurse Practitioner

## 2022-02-20 ENCOUNTER — Ambulatory Visit (INDEPENDENT_AMBULATORY_CARE_PROVIDER_SITE_OTHER): Payer: Medicare Other | Admitting: Nurse Practitioner

## 2022-02-20 ENCOUNTER — Ambulatory Visit (INDEPENDENT_AMBULATORY_CARE_PROVIDER_SITE_OTHER): Payer: Medicare Other

## 2022-02-20 VITALS — BP 178/107 | HR 83 | Resp 16 | Wt 182.4 lb

## 2022-02-20 DIAGNOSIS — I152 Hypertension secondary to endocrine disorders: Secondary | ICD-10-CM | POA: Diagnosis not present

## 2022-02-20 DIAGNOSIS — I83893 Varicose veins of bilateral lower extremities with other complications: Secondary | ICD-10-CM

## 2022-02-20 DIAGNOSIS — E1159 Type 2 diabetes mellitus with other circulatory complications: Secondary | ICD-10-CM | POA: Diagnosis not present

## 2022-02-25 ENCOUNTER — Telehealth: Payer: Medicare Other

## 2022-02-25 ENCOUNTER — Encounter: Payer: Self-pay | Admitting: Physician Assistant

## 2022-02-25 ENCOUNTER — Ambulatory Visit (INDEPENDENT_AMBULATORY_CARE_PROVIDER_SITE_OTHER): Payer: Medicare Other | Admitting: Physician Assistant

## 2022-02-25 ENCOUNTER — Other Ambulatory Visit: Payer: Self-pay

## 2022-02-25 VITALS — BP 146/76 | HR 74 | Ht 65.0 in | Wt 181.1 lb

## 2022-02-25 DIAGNOSIS — I152 Hypertension secondary to endocrine disorders: Secondary | ICD-10-CM | POA: Diagnosis not present

## 2022-02-25 DIAGNOSIS — E785 Hyperlipidemia, unspecified: Secondary | ICD-10-CM

## 2022-02-25 DIAGNOSIS — E1159 Type 2 diabetes mellitus with other circulatory complications: Secondary | ICD-10-CM | POA: Diagnosis not present

## 2022-02-25 DIAGNOSIS — E1069 Type 1 diabetes mellitus with other specified complication: Secondary | ICD-10-CM

## 2022-02-25 NOTE — Progress Notes (Addendum)
I,Sha'taria Tyson,acting as a Education administrator for [employer], PA-C.,have documented all relevant documentation on the behalf of Mikey Kirschner, PA-C,as directed by  Mikey Kirschner, PA-C while in the presence of Mikey Kirschner, PA-C.  Established Patient Office Visit  Subjective:  Patient ID: Karla Patton, female    DOB: Sep 11, 1951  Age: 71 y.o. MRN: [id number]  CC: HTN f/u  HPI Karla Patton presents for hypertension follow-up. Hypertension, follow-up  BP Readings from Last 3 Encounters:  02/25/22 (!) 146/76  02/20/22 (!) 178/107  01/13/22 140/85   Wt Readings from Last 3 Encounters:  02/25/22 181 lb 1.6 oz (82.1 kg)  02/20/22 182 lb 6.4 oz (82.7 kg)  01/13/22 181 lb (82.1 kg)     She was last seen for hypertension 6 weeks ago.  BP at that visit was 140/85. Management since that visit includes add lisinopril 10 mg daily.  She reports fair compliance with treatment. She is having side effects. Coughing from lisinopril. She stopped this medication as well as the Crestor as she read that can cause coughing as well. She is following a Low Sodium diet. She is exercising. She does not smoke.  Use of agents associated with hypertension: none.   Outside blood pressures are: highest 146/76, lowest 118/71.   Symptoms: No chest pain No chest pressure  No palpitations No syncope  No dyspnea No orthopnea  No paroxysmal nocturnal dyspnea No lower extremity edema   Pertinent labs: Lab Results  Component Value Date   CHOL 200 (H) 01/07/2022   HDL 78 01/07/2022   LDLCALC 108 (H) 01/07/2022   TRIG 77 01/07/2022   CHOLHDL 3.4 01/20/2020   Lab Results  Component Value Date   NA 130 (L) 01/27/2022   K 5.2 01/27/2022   CREATININE 0.69 01/27/2022   EGFR 94 01/27/2022   GLUCOSE 152 (H) 01/27/2022   TSH 1.450 01/07/2022     The 10-year ASCVD risk score (Arnett DK, et al., 2019) is: 24.4%    ---------------------------------------------------------------------------------------------------   Past Medical History:  Diagnosis Date   Anemia    Anxiety    Arthritis    Back pain    Diabetes mellitus without complication (HCC)    type 1   GERD (gastroesophageal reflux disease)    Hypothyroidism    Thyroid disease    Varicose vein     Past Surgical History:  Procedure Laterality Date   ABDOMINAL HYSTERECTOMY  1994   CHOLECYSTECTOMY  09-07-13   COLONOSCOPY  2009   normal   COLONOSCOPY WITH PROPOFOL N/A 10/20/2018   Procedure: COLONOSCOPY WITH PROPOFOL;  Surgeon: Robert Bellow, MD;  Location: [facility] ENDOSCOPY;  Service: Endoscopy;  Laterality: N/A;   INCONTINENCE SURGERY  2003   left leg vein ligation  2015   VARICOSE VEIN SURGERY  1990's   Dr Bary Castilla   VEIN SURGERY Left 11/17/2017   vein closure procedure/Dr Jamal Collin    Family History  Problem Relation Age of Onset   Diabetes Mother    Pancreatitis Mother    Heart disease Father    Stroke Father        x 2   Hypertension Father    Fibromyalgia Sister    Scoliosis Sister    Arthritis Sister    Diabetes Sister    Breast cancer Neg Hx     Social History   Socioeconomic History   Marital status: Divorced    Spouse name: Not on file   Number of children: 2   Years of  education: 12   Highest education level: High school graduate  Occupational History   Occupation: Retired  Tobacco Use   Smoking status: Never   Smokeless tobacco: Never  Vaping Use   Vaping Use: Never used  Substance and Sexual Activity   Alcohol use: Yes    Alcohol/week: 7.0 standard drinks    Types: 7 Shots of liquor per week    Comment: vodka 1 drink nightly  and/ or wine   Drug use: No   Sexual activity: Never    Partners: Male    Birth control/protection: None, Surgical  Other Topics Concern   Not on file  Social History Narrative   Retired from [employer] as Engineer, maintenance   2 children Josephine (1977, [place])    [place] 3 blood related   5 step-grandchildren    Enjoys going to [employer] and reading   No pets    Social Determinants of Radio broadcast assistant Strain: Low Risk    Difficulty of Paying Living Expenses: Not hard at all  Food Insecurity: Not on file  Transportation Needs: Not on file  Physical Activity: Not on file  Stress: Not on file  Social Connections: Not on file  Intimate Partner Violence: Not on file    Outpatient Medications Prior to Visit  Medication Sig Dispense Refill   acetaminophen (TYLENOL) 650 MG CR tablet Take 650 mg by mouth every 8 (eight) hours as needed for pain.     ALPRAZolam (XANAX) 0.5 MG tablet TAKE 1 TABLET(0.5 MG) BY MOUTH once daily as needed 10 tablet 0   APPLE CIDER VINEGAR PO Take 600 mg by mouth daily.      aspirin EC 81 MG tablet Take 81 mg by mouth daily. Takes at bedtime     beta carotene 25000 UNIT capsule Take 25,000 Units by mouth daily.     Cholecalciferol (VITAMIN D3) 250 MCG (10000 UT) capsule Take 10,000 Units by mouth daily.     CONTOUR NEXT TEST test strip CHECK BLOOD SUGAR BEFORE MEALS AND AT BEDTIME 400 each 3   Ferrous Sulfate Dried (HIGH POTENCY IRON) 65 MG TABS Take 1 tablet by mouth daily.     furosemide (LASIX) 20 MG tablet TAKE 1 TABLET(20 MG) BY MOUTH DAILY (Patient taking differently: Take 20 mg by mouth daily. TAKE 1 TABLET(20 MG) BY MOUTH DAILY) 90 tablet 3   insulin aspart (NOVOLOG FLEXPEN) 100 UNIT/ML FlexPen Inject 5-10 Units into the skin 3 (three) times daily with meals. Per sliding scale     insulin glargine (LANTUS SOLOSTAR) 100 UNIT/ML Solostar Pen Inject 20 Units into the skin daily with breakfast. 15 mL 3   Insulin Pen Needle 31G X 8 MM MISC Use to inject insulin 4-6 times daily as prescribed 100 each 5   levothyroxine (SYNTHROID) 100 MCG tablet TAKE 1 TABLET BY MOUTH DAILY BEFORE BREAKFAST 90 tablet 1   MAGNESIUM-ZINC PO Take 1 tablet by mouth daily.     Multiple Vitamins-Minerals (ABC PLUS SENIOR ADULTS 50+  PO) Take 1 tablet by mouth daily.     omeprazole (PRILOSEC) 40 MG capsule TAKE 1 CAPSULE(40 MG) BY MOUTH DAILY 90 capsule 1   potassium chloride SA (KLOR-CON M) 20 MEQ tablet Take 20 mEq by mouth every other day.     pregabalin (LYRICA) 225 MG capsule TAKE 1 CAPSULE(225 MG) BY MOUTH DAILY 90 capsule 1   fluticasone (FLONASE) 50 MCG/ACT nasal spray Place 2 sprays into both nostrils daily. 16 g 6  rosuvastatin (CRESTOR) 5 MG tablet Take 1 tablet (5 mg total) by mouth daily. 90 tablet 0   lisinopril (ZESTRIL) 10 MG tablet Take 1 tablet (10 mg total) by mouth daily. 90 tablet 3   No facility-administered medications prior to visit.    Allergies  Allergen Reactions   Lisinopril Cough    ROS Review of Systems  Constitutional:  Negative for fatigue and fever.  Respiratory:  Positive for cough. Negative for shortness of breath.   Cardiovascular:  Negative for chest pain and leg swelling.  Gastrointestinal:  Negative for abdominal pain.  Neurological:  Negative for dizziness and headaches.     Objective:    Physical Exam Constitutional:      Appearance: Normal appearance. She is not ill-appearing.  HENT:     Head: Normocephalic.  Eyes:     Conjunctiva/sclera: Conjunctivae normal.  Cardiovascular:     Rate and Rhythm: Normal rate and regular rhythm.     Pulses: Normal pulses.     Heart sounds: Murmur heard.  Pulmonary:     Effort: Pulmonary effort is normal.     Breath sounds: Normal breath sounds.  Musculoskeletal:     Right lower leg: No edema.     Left lower leg: No edema.  Neurological:     Mental Status: She is oriented to person, place, and time.  Psychiatric:        Mood and Affect: Mood normal.        Behavior: Behavior normal.    BP (!) 146/76 Comment: home value from 02/25/2022   Pulse 74    Ht 5' 5"  (1.651 m)    Wt 181 lb 1.6 oz (82.1 kg)    SpO2 99%    BMI 30.14 kg/m  Wt Readings from Last 3 Encounters:  02/25/22 181 lb 1.6 oz (82.1 kg)  02/20/22 182 lb 6.4 oz  (82.7 kg)  01/13/22 181 lb (82.1 kg)     Health Maintenance Due  Topic Date Due   Zoster Vaccines- Shingrix (1 of 2) Never done   COVID-19 Vaccine (5 - Booster for Pfizer series) 11/18/2021    There are no preventive care reminders to display for this patient.  Lab Results  Component Value Date   TSH 1.450 01/07/2022   Lab Results  Component Value Date   WBC 7.4 06/13/2021   HGB 11.3 06/13/2021   HCT 34.5 06/13/2021   MCV 85 06/13/2021   PLT 440 06/13/2021   Lab Results  Component Value Date   NA 130 (L) 01/27/2022   K 5.2 01/27/2022   CO2 26 01/27/2022   GLUCOSE 152 (H) 01/27/2022   BUN 11 01/27/2022   CREATININE 0.69 01/27/2022   BILITOT 0.5 01/07/2022   ALKPHOS 123 (H) 01/07/2022   AST 20 01/07/2022   ALT 24 01/07/2022   PROT 7.6 01/07/2022   ALBUMIN 4.7 01/07/2022   CALCIUM 9.5 01/27/2022   ANIONGAP 7 03/19/2021   EGFR 94 01/27/2022   GFR 91.51 03/01/2015   Lab Results  Component Value Date   CHOL 200 (H) 01/07/2022   Lab Results  Component Value Date   HDL 78 01/07/2022   Lab Results  Component Value Date   LDLCALC 108 (H) 01/07/2022   Lab Results  Component Value Date   TRIG 77 01/07/2022   Lab Results  Component Value Date   CHOLHDL 3.4 01/20/2020   Lab Results  Component Value Date   HGBA1C 7.5 (H) 01/07/2022      Assessment &  Plan:   Problem List Items Addressed This Visit       Cardiovascular and Mediastinum   Hypertension associated with diabetes ([place]) - Primary    Cough from lisinopril. D/c. Added to CI list. Home values average within normal limits.  Currently taking lasix daily, may contribute to lower BP. High in office, may be an element of white coat HTN Advised pt to continue to check 2-3 times a week in AM and PM.  F/u 3 mo, advised pt to call if numbers trending upward at home before appt.          Endocrine   Hyperlipidemia due to type 1 diabetes mellitus ([place])    Advised to restart crestor. The 10-year  ASCVD risk score (Arnett DK, et al., 2019) is: 31.3% Discussed benefits of medication. She will restart, we can recheck cholesterol in 3 mo.         Follow-up: Return in about 3 months (around 05/28/2022) for hypertension.    I, Mikey Kirschner, PA-C have reviewed all documentation for this visit. The documentation on  02/25/2022  for the exam, diagnosis, procedures, and orders are all accurate and complete.   Mikey Kirschner, PA-C [facility] [address] [place], [place], [address] Office: [phone] Fax: [phone]

## 2022-02-25 NOTE — Assessment & Plan Note (Signed)
Advised to restart crestor. ?The 10-year ASCVD risk score (Arnett DK, et al., 2019) is: 31.3% ?Discussed benefits of medication. ?She will restart, we can recheck cholesterol in 3 mo.  ?

## 2022-02-25 NOTE — Assessment & Plan Note (Signed)
Cough from lisinopril. D/c. Added to CI list. ?Home values average within normal limits.  ?Currently taking lasix daily, may contribute to lower BP. High in office, may be an element of white coat HTN ?Advised pt to continue to check 2-3 times a week in AM and PM.  ?F/u 3 mo, advised pt to call if numbers trending upward at home before appt.  ? ?

## 2022-02-26 DIAGNOSIS — M9902 Segmental and somatic dysfunction of thoracic region: Secondary | ICD-10-CM | POA: Diagnosis not present

## 2022-02-26 DIAGNOSIS — M9901 Segmental and somatic dysfunction of cervical region: Secondary | ICD-10-CM | POA: Diagnosis not present

## 2022-02-26 DIAGNOSIS — M9903 Segmental and somatic dysfunction of lumbar region: Secondary | ICD-10-CM | POA: Diagnosis not present

## 2022-02-26 DIAGNOSIS — M5136 Other intervertebral disc degeneration, lumbar region: Secondary | ICD-10-CM | POA: Diagnosis not present

## 2022-02-26 DIAGNOSIS — M5383 Other specified dorsopathies, cervicothoracic region: Secondary | ICD-10-CM | POA: Diagnosis not present

## 2022-02-26 DIAGNOSIS — M5387 Other specified dorsopathies, lumbosacral region: Secondary | ICD-10-CM | POA: Diagnosis not present

## 2022-02-26 DIAGNOSIS — M9904 Segmental and somatic dysfunction of sacral region: Secondary | ICD-10-CM | POA: Diagnosis not present

## 2022-02-27 ENCOUNTER — Other Ambulatory Visit: Payer: Self-pay

## 2022-02-27 ENCOUNTER — Encounter: Payer: Self-pay | Admitting: Internal Medicine

## 2022-02-27 ENCOUNTER — Other Ambulatory Visit: Payer: Self-pay | Admitting: Internal Medicine

## 2022-02-27 ENCOUNTER — Ambulatory Visit (INDEPENDENT_AMBULATORY_CARE_PROVIDER_SITE_OTHER): Payer: Medicare Other | Admitting: Internal Medicine

## 2022-02-27 VITALS — BP 146/96 | HR 75 | Ht 65.0 in | Wt 180.0 lb

## 2022-02-27 DIAGNOSIS — E103299 Type 1 diabetes mellitus with mild nonproliferative diabetic retinopathy without macular edema, unspecified eye: Secondary | ICD-10-CM | POA: Diagnosis not present

## 2022-02-27 DIAGNOSIS — E1065 Type 1 diabetes mellitus with hyperglycemia: Secondary | ICD-10-CM | POA: Insufficient documentation

## 2022-02-27 DIAGNOSIS — E1042 Type 1 diabetes mellitus with diabetic polyneuropathy: Secondary | ICD-10-CM | POA: Insufficient documentation

## 2022-02-27 DIAGNOSIS — E039 Hypothyroidism, unspecified: Secondary | ICD-10-CM | POA: Diagnosis not present

## 2022-02-27 MED ORDER — BD PEN NEEDLE MICRO U/F 32G X 6 MM MISC: 1.0000 | Freq: Four times a day (QID) | 3 refills | Status: DC

## 2022-02-27 MED ORDER — INSULIN LISPRO (1 UNIT DIAL) 100 UNIT/ML (KWIKPEN)
PEN_INJECTOR | SUBCUTANEOUS | 4 refills | Status: DC
Start: 2022-02-27 — End: 2022-05-27

## 2022-02-27 MED ORDER — NOVOLOG FLEXPEN 100 UNIT/ML ~~LOC~~ SOPN: PEN_INJECTOR | SUBCUTANEOUS | 3 refills | Status: DC

## 2022-02-27 MED ORDER — LANTUS SOLOSTAR 100 UNIT/ML ~~LOC~~ SOPN: 16.0000 [IU] | PEN_INJECTOR | Freq: Every day | SUBCUTANEOUS | 6 refills | Status: DC

## 2022-02-27 MED ORDER — LEVOTHYROXINE SODIUM 100 MCG PO TABS: 100.0000 ug | ORAL_TABLET | Freq: Every day | ORAL | 3 refills | Status: DC

## 2022-02-27 NOTE — Patient Instructions (Addendum)
-   Decrease Lantus 16 units daily  ?- Novolog take one unit for every 13 grams of carbohydrates ( try to take it immediately before the meal or half way through the meal  ?-Novolog correctional insulin: ADD extra units on insulin to your meal-time Novolog dose if your blood sugars are higher than 180. Use the scale below to help guide you:  ? ?Blood sugar before meal Number of units to inject  ?Less than 180 0 unit  ?181 -  230 1 units  ?231 -  280 2 units  ?281 -  330 3 units  ?331 -  380 4 units  ?381 -  430 5 units  ? ? ? ?Check sugar before you take a walk , if sugar is less than 150 , eat a protein bar before you start the walk  ? ?HOW TO TREAT LOW BLOOD SUGARS (Blood sugar LESS THAN 70 MG/DL) ?Please follow the RULE OF 15 for the treatment of hypoglycemia treatment (when your (blood sugars are less than 70 mg/dL)  ? ?STEP 1: Take 15 grams of carbohydrates when your blood sugar is low, which includes:  ?3-4 GLUCOSE TABS  OR ?3-4 OZ OF JUICE OR REGULAR SODA OR ?ONE TUBE OF GLUCOSE GEL   ? ?STEP 2: RECHECK blood sugar in 15 MINUTES ?STEP 3: If your blood sugar is still low at the 15 minute recheck --> then, go back to STEP 1 and treat AGAIN with another 15 grams of carbohydrates. ? ?

## 2022-02-27 NOTE — Progress Notes (Signed)
Name: Karla Patton  MRN/ DOB: [id number], [birth year]   Age/ Sex: 71 y.o., female    PCP: Karla Kirschner, PA-C   Reason for Endocrinology Evaluation: Type 1 Diabetes Mellitus     Date of Initial Endocrinology Visit: 02/27/2022     PATIENT IDENTIFIER: Karla Patton is a 71 y.o. female with a past medical history of T1DM, Hypothyroidism, Dyslipidemia . The patient presented for initial endocrinology clinic visit on 02/27/2022 for consultative assistance with her diabetes management.    HPI: Karla Patton was    Diagnosed with DM at age 57  Prior Medications tried/Intolerance: She was on [employer] but did not like the tubing   Currently checking blood sugars multiple  x / day,  through CGM Hypoglycemia episodes : yes                Symptoms: yes                  Frequency: occasionally  Hemoglobin A1c has ranged from 6.8% in 2022, peaking at 8.9% in 2019. Patient required assistance for hypoglycemia: yes x2 over the years  Patient has required hospitalization within the last 1 year from hyper or hypoglycemia: no  In terms of diet, the patient eats 3 meals, avoids sugar sweetened beverages    HOME DIABETES REGIMEN: Lantus 10  units BID  Novolog I:C ratio of 1:15   > 250 = 2 units   Statin: yes ACE-I/ARB: yes     CONTINUOUS GLUCOSE MONITORING RECORD INTERPRETATION    Dates of Recording: 2/24/-02/27/2022  Sensor description: dexcom  Results statistics:   CGM use % of time 93  Average and SD 183/73  Time in range    53    %  % Time Above 180 26  % Time above 250 19  % Time Below target 1      Glycemic patterns summary:  hyperglycemia during the day and optimal at night   Hyperglycemic episodes  postprandial  Hypoglycemic episodes occurred variable  Overnight periods: trends down       DIABETIC COMPLICATIONS: Microvascular complications:  Non proliferative DR, neuropathy  Denies: CKD  Last eye exam: Completed 01/03/2022  Macrovascular complications:    Denies: CAD, PVD, CVA   PAST HISTORY: Past Medical History:  Past Medical History:  Diagnosis Date   Anemia    Anxiety    Arthritis    Back pain    Diabetes mellitus without complication (HCC)    type 1   GERD (gastroesophageal reflux disease)    Hypothyroidism    Thyroid disease    Varicose vein    Past Surgical History:  Past Surgical History:  Procedure Laterality Date   ABDOMINAL HYSTERECTOMY  1994   CHOLECYSTECTOMY  09-07-13   COLONOSCOPY  2009   normal   COLONOSCOPY WITH PROPOFOL N/A 10/20/2018   Procedure: COLONOSCOPY WITH PROPOFOL;  Surgeon: Robert Bellow, MD;  Location: [facility] ENDOSCOPY;  Service: Endoscopy;  Laterality: N/A;   INCONTINENCE SURGERY  2003   left leg vein ligation  2015   VARICOSE VEIN SURGERY  1990's   Dr Bary Castilla   VEIN SURGERY Left 11/17/2017   vein closure procedure/Dr Jamal Collin    Social History:  reports that she has never smoked. She has never used smokeless tobacco. She reports current alcohol use of about 7.0 standard drinks per week. She reports that she does not use drugs. Family History:  Family History  Problem Relation Age of Onset   Diabetes Mother  Pancreatitis Mother    Heart disease Father    Stroke Father        x 2   Hypertension Father    Fibromyalgia Sister    Scoliosis Sister    Arthritis Sister    Diabetes Sister    Breast cancer Neg Hx      HOME MEDICATIONS: Allergies as of 02/27/2022       Reactions   Lisinopril Cough        Medication List        Accurate as of February 27, 2022 11:07 AM. If you have any questions, ask your nurse or doctor.          ABC PLUS SENIOR ADULTS 50+ PO Take 1 tablet by mouth daily.   acetaminophen 650 MG CR tablet Commonly known as: TYLENOL Take 650 mg by mouth every 8 (eight) hours as needed for pain.   ALPRAZolam 0.5 MG tablet Commonly known as: XANAX TAKE 1 TABLET(0.5 MG) BY MOUTH once daily as needed   APPLE CIDER VINEGAR PO Take 600 mg by mouth daily.    aspirin EC 81 MG tablet Take 81 mg by mouth daily. Takes at bedtime   beta carotene 25000 UNIT capsule Take 25,000 Units by mouth daily.   Contour Next Test test strip Generic drug: glucose blood CHECK BLOOD SUGAR BEFORE MEALS AND AT BEDTIME   furosemide 20 MG tablet Commonly known as: LASIX TAKE 1 TABLET(20 MG) BY MOUTH DAILY What changed:  how much to take how to take this when to take this   High Potency Iron 65 MG Tabs Take 1 tablet by mouth daily.   Insulin Pen Needle 31G X 8 MM Misc Use to inject insulin 4-6 times daily as prescribed   Lantus SoloStar 100 UNIT/ML Solostar Pen Generic drug: insulin glargine Inject 20 Units into the skin daily with breakfast.   levothyroxine 100 MCG tablet Commonly known as: SYNTHROID TAKE 1 TABLET BY MOUTH DAILY BEFORE BREAKFAST   MAGNESIUM-ZINC PO Take 1 tablet by mouth daily.   NovoLOG FlexPen 100 UNIT/ML FlexPen Generic drug: insulin aspart Inject 5-10 Units into the skin 3 (three) times daily with meals. Per sliding scale   omeprazole 40 MG capsule Commonly known as: PRILOSEC TAKE 1 CAPSULE(40 MG) BY MOUTH DAILY   potassium chloride SA 20 MEQ tablet Commonly known as: KLOR-CON M Take 20 mEq by mouth every other day.   pregabalin 225 MG capsule Commonly known as: LYRICA TAKE 1 CAPSULE(225 MG) BY MOUTH DAILY   rosuvastatin 5 MG tablet Commonly known as: Crestor Take 1 tablet (5 mg total) by mouth daily.   Vitamin D3 250 MCG (10000 UT) capsule Take 10,000 Units by mouth daily.         ALLERGIES: Allergies  Allergen Reactions   Lisinopril Cough     REVIEW OF SYSTEMS: A comprehensive ROS was conducted with the patient and is negative except as per HPI and below:  Review of Systems  Gastrointestinal:  Positive for constipation. Negative for diarrhea, nausea and vomiting.     OBJECTIVE:   VITAL SIGNS: BP (!) 146/96 (BP Location: Left Arm, Patient Position: Sitting, Cuff Size: Large)    Pulse 75    Ht  '5\' 5"'$  (1.651 m)    Wt 180 lb (81.6 kg)    SpO2 97%    BMI 29.95 kg/m      PHYSICAL EXAM:  General: Pt appears well and is in NAD  Neck: General: Supple without adenopathy or carotid  bruits. Thyroid: Thyroid size normal.  No goiter or nodules appreciated.  Lungs: Clear with good BS bilat with no rales, rhonchi, or wheezes  Heart: RRR with normal S1 and S2 and no gallops; no murmurs; no rub  Abdomen: Normoactive bowel sounds, soft, nontender, without masses or organomegaly palpable  Extremities:  Lower extremities - No pretibial edema. No lesions.  Neuro: MS is good with appropriate affect, pt is alert and Ox3    DM foot exam: 02/27/2022  The skin of the feet is without sores or ulcerations, right foot deformity  The pedal pulses are 2+ on right and 2+ on left. The sensation is decreased  to a screening 5.07, 10 gram monofilament on the right     DATA REVIEWED:  Lab Results  Component Value Date   HGBA1C 7.5 (H) 01/07/2022   HGBA1C 7.6 (A) 06/13/2021   HGBA1C 7.9 (H) 03/18/2021   Lab Results  Component Value Date   MICROALBUR negative 07/23/2020   LDLCALC 108 (H) 01/07/2022   CREATININE 0.69 01/27/2022   Lab Results  Component Value Date   MICRALBCREAT <30 01/13/2022    Lab Results  Component Value Date   CHOL 200 (H) 01/07/2022   HDL 78 01/07/2022   LDLCALC 108 (H) 01/07/2022   TRIG 77 01/07/2022   CHOLHDL 3.4 01/20/2020        Latest Reference Range & Units 05/01/15 14:41  Glutamic Acid Decarb Ab < IU/mL >250 (H)   ASSESSMENT / PLAN / RECOMMENDATIONS:   1) Type 1 Diabetes Mellitus, Sub-OPtimally controlled, With neuropathic and retinopathic complications - Most recent A1c of 7.5 %. Goal A1c < 7.0 %.    Plan: GENERAL: I have discussed with the patient the pathophysiology of diabetes. I explained the complications associated with diabetes including retinopathy, nephropathy, neuropathy as well as increased risk of cardiovascular disease. We went over the  benefit seen with glycemic control.   I explained to the patient that diabetic patients are at higher than normal risk for amputations.  She is only interested in tubeless pumps  Discussed pharmacokinetics of basal/bolus insulin and the importance of taking prandial insulin with meals.  We also discussed avoiding sugar-sweetened beverages and snacks, when possible.  Patient tends to take prandial insulin after meals, I have advised her to either take it before or halfway through the meal. She also uses correction scale based on postprandial glucose reading, I have advised the patient to use correction scale based on Premeal glucose reading to prevent hypoglycemia Patient was also instructed to check glucose before walking, if BG<150 mg/DL to take protein bar In review of her CGM download today, patient has been noted with overnight hypoglycemia, I will reduce her Lantus as below, she was also advised that she could take Lantus together instead of dividing the dose, as separating the dose is not going to make Lantus any less effective or more for that matter I will also adjust her insulin to carb ratio  MEDICATIONS: Decrease Lantus to 16 units daily NovoLog 1: 13 grams of CHO Correction factor : NovoLog  (BG -130/50)   EDUCATION / INSTRUCTIONS: BG monitoring instructions: Patient is instructed to check her blood sugars 3 times a day, before meals. Call [facility] if: BG persistently < 70 I reviewed the Rule of 15 for the treatment of hypoglycemia in detail with the patient. Literature supplied.   2) Diabetic complications:  Eye: Does  have known diabetic retinopathy.  Neuro/ Feet: Does  have known diabetic peripheral  neuropathy. Renal: Patient does not have known baseline CKD. She is  on an ACEI/ARB at present.      3) Hypothyroidism:   -We will refill based on patient request   Medication Levothyroxine 100 mcg daily      Signed electronically by: Mack Guise, MD  [facility]  [facility] [address] [place], [place] [address] Phone: [phone] FAX: [phone]   CC: Emelia Loron [address] [place] [address] Phone: [phone]  Fax: [phone]    Return to Endocrinology clinic as below: Future Appointments  Date Time Provider [place]  05/28/2022  9:20 AM Karla Kirschner, PA-C [facility]

## 2022-02-28 ENCOUNTER — Other Ambulatory Visit: Payer: Self-pay | Admitting: Internal Medicine

## 2022-03-02 ENCOUNTER — Encounter (INDEPENDENT_AMBULATORY_CARE_PROVIDER_SITE_OTHER): Payer: Self-pay | Admitting: Nurse Practitioner

## 2022-03-03 NOTE — Progress Notes (Signed)
Subjective:    Patient ID: Karla Patton, female    DOB: 1951/09/08, 70 y.o.   MRN: [id number] Chief Complaint  Patient presents with   New Patient (Initial Visit)    Ref Rollene Rotunda varicose veins of lower extremity with edema    The patient returns for followup evaluation 2 years after the initial visit. The patient continues to have pain and swelling in the lower extremities with dependency. The pain is lessened with elevation. Graduated compression stockings, Class I (20-30 mmHg), have been worn but the stockings do not eliminate the leg pain. Over-the-counter analgesics do not improve the symptoms. The degree of discomfort continues to interfere with daily activities. The patient notes the pain in the legs is causing problems with daily exercise, at the workplace and even with household activities and maintenance such as standing in the kitchen preparing meals and doing dishes.   Venous ultrasound shows normal deep venous system, no evidence of acute or chronic DVT.  Superficial reflux is present in the right great saphenous vein from the mid thigh to the proximal calf.  Left lower extremity also has reflux at the proximal thigh.  Previous noninvasive studies show evidence of ablation in the left lower extremity although none is noted today.  Previous study also showed that the great saphenous vein was being fed by a perforator.   Review of Systems  Cardiovascular:  Positive for leg swelling.  All other systems reviewed and are negative.     Objective:   Physical Exam Vitals reviewed.  HENT:     Head: Normocephalic.  Cardiovascular:     Rate and Rhythm: Normal rate.  Pulmonary:     Effort: Pulmonary effort is normal.  Skin:    General: Skin is dry.  Neurological:     Mental Status: She is alert and oriented to person, place, and time.  Psychiatric:        Mood and Affect: Mood normal.        Behavior: Behavior normal.        Thought Content: Thought content normal.         Judgment: Judgment normal.    BP (!) 178/107 (BP Location: Left Arm)    Pulse 83    Resp 16    Wt 182 lb 6.4 oz (82.7 kg)    BMI 30.35 kg/m   Past Medical History:  Diagnosis Date   Anemia    Anxiety    Arthritis    Back pain    Diabetes mellitus without complication (HCC)    type 1   GERD (gastroesophageal reflux disease)    Hypothyroidism    Thyroid disease    Varicose vein     Social History   Socioeconomic History   Marital status: Single    Spouse name: Not on file   Number of children: 2   Years of education: 12   Highest education level: High school graduate  Occupational History   Occupation: Retired  Tobacco Use   Smoking status: Never   Smokeless tobacco: Never  Vaping Use   Vaping Use: Never used  Substance and Sexual Activity   Alcohol use: Yes    Alcohol/week: 7.0 standard drinks    Types: 7 Shots of liquor per week    Comment: vodka 1 drink nightly  and/ or wine   Drug use: No   Sexual activity: Never    Partners: Male    Birth control/protection: None, Surgical  Other Topics Concern   Not  on file  Social History Narrative   Retired from [employer] as Engineer, maintenance   2 children [place] (1977, 1979)   [place] 3 blood related   5 step-grandchildren    Enjoys going to [employer] and reading   No pets    Social Determinants of Radio broadcast assistant Strain: Low Risk    Difficulty of Paying Living Expenses: Not hard at all  Food Insecurity: Not on file  Transportation Needs: Not on file  Physical Activity: Not on file  Stress: Not on file  Social Connections: Not on file  Intimate Partner Violence: Not on file    Past Surgical History:  Procedure Laterality Date   [place]  09-07-13   COLONOSCOPY  2009   normal   COLONOSCOPY WITH PROPOFOL N/A 10/20/2018   Procedure: COLONOSCOPY WITH PROPOFOL;  Surgeon: Robert Bellow, MD;  Location: [facility] ENDOSCOPY;  Service: Endoscopy;  Laterality:  N/A;   INCONTINENCE SURGERY  2003   left leg vein ligation  2015   VARICOSE VEIN SURGERY  1990's   Dr Bary Castilla   VEIN SURGERY Left 11/17/2017   vein closure procedure/Dr Jamal Collin    Family History  Problem Relation Age of Onset   Diabetes Mother    Pancreatitis Mother    Heart disease Father    Stroke Father        x 2   Hypertension Father    Fibromyalgia Sister    Scoliosis Sister    Arthritis Sister    Diabetes Sister    Breast cancer Neg Hx     Allergies  Allergen Reactions   Lisinopril Cough    CBC Latest Ref Rng & Units 06/13/2021 03/19/2021 03/18/2021  WBC 3.4 - 10.8 x10E3/uL 7.4 9.8 -  Hemoglobin 11.1 - 15.9 g/dL 11.3 9.3(L) 10.9(L)  Hematocrit 34.0 - 46.6 % 34.5 28.1(L) 32.0(L)  Platelets 150 - 450 x10E3/uL 440 327 -      CMP     Component Value Date/Time   NA 130 (L) 01/27/2022 0913   K 5.2 01/27/2022 0913   CL 89 (L) 01/27/2022 0913   CO2 26 01/27/2022 0913   GLUCOSE 152 (H) 01/27/2022 0913   GLUCOSE 347 (H) 03/19/2021 0420   BUN 11 01/27/2022 0913   CREATININE 0.69 01/27/2022 0913   CALCIUM 9.5 01/27/2022 0913   PROT 7.6 01/07/2022 0928   ALBUMIN 4.7 01/07/2022 0928   AST 20 01/07/2022 0928   ALT 24 01/07/2022 0928   ALKPHOS 123 (H) 01/07/2022 0928   BILITOT 0.5 01/07/2022 0928   GFRNONAA >60 03/19/2021 0420   GFRAA 95 01/28/2021 1129     No results found.     Assessment & Plan:   1. Varicose veins of lower extremity with edema, bilateral Recommend  I have reviewed my previous  discussion with the patient regarding  varicose veins and why they cause symptoms. Patient will continue  wearing graduated compression stockings class 1 on a daily basis, beginning first thing in the morning and removing them in the evening.    In addition, behavioral modification including elevation during the day was again discussed and this will continue.  The patient has utilized over the counter pain medications and has been exercising.  However, at this  time conservative therapy has not alleviated the patient's symptoms of leg pain and swelling  Recommend: laser ablation of the right and  left great saphenous veins to eliminate the symptoms of pain and swelling  of the lower extremities caused by the severe superficial venous reflux disease.    2. Hypertension associated with diabetes ([place]) Continue antihypertensive medications as already ordered, these medications have been reviewed and there are no changes at this time.    Current Outpatient Medications on File Prior to Visit  Medication Sig Dispense Refill   acetaminophen (TYLENOL) 650 MG CR tablet Take 650 mg by mouth every 8 (eight) hours as needed for pain.     ALPRAZolam (XANAX) 0.5 MG tablet TAKE 1 TABLET(0.5 MG) BY MOUTH once daily as needed 10 tablet 0   APPLE CIDER VINEGAR PO Take 600 mg by mouth daily.      aspirin EC 81 MG tablet Take 81 mg by mouth daily. Takes at bedtime     beta carotene 25000 UNIT capsule Take 25,000 Units by mouth daily.     Cholecalciferol (VITAMIN D3) 250 MCG (10000 UT) capsule Take 10,000 Units by mouth daily.     CONTOUR NEXT TEST test strip CHECK BLOOD SUGAR BEFORE MEALS AND AT BEDTIME (Patient not taking: Reported on 02/27/2022) 400 each 3   Ferrous Sulfate Dried (HIGH POTENCY IRON) 65 MG TABS Take 1 tablet by mouth daily.     furosemide (LASIX) 20 MG tablet TAKE 1 TABLET(20 MG) BY MOUTH DAILY (Patient taking differently: Take 20 mg by mouth daily. TAKE 1 TABLET(20 MG) BY MOUTH DAILY) 90 tablet 3   MAGNESIUM-ZINC PO Take 1 tablet by mouth daily.     Multiple Vitamins-Minerals (ABC PLUS SENIOR ADULTS 50+ PO) Take 1 tablet by mouth daily.     omeprazole (PRILOSEC) 40 MG capsule TAKE 1 CAPSULE(40 MG) BY MOUTH DAILY 90 capsule 1   potassium chloride SA (KLOR-CON M) 20 MEQ tablet Take 20 mEq by mouth every other day.     pregabalin (LYRICA) 225 MG capsule TAKE 1 CAPSULE(225 MG) BY MOUTH DAILY 90 capsule 1   rosuvastatin (CRESTOR) 5 MG tablet Take 1 tablet (5  mg total) by mouth daily. 90 tablet 0   No current facility-administered medications on file prior to visit.    There are no Patient Instructions on file for this visit. No follow-ups on file.   Kris Hartmann, NP

## 2022-03-04 ENCOUNTER — Other Ambulatory Visit: Payer: Self-pay | Admitting: Physician Assistant

## 2022-03-04 ENCOUNTER — Other Ambulatory Visit: Payer: Self-pay | Admitting: Family Medicine

## 2022-03-04 ENCOUNTER — Other Ambulatory Visit: Payer: Self-pay

## 2022-03-04 ENCOUNTER — Other Ambulatory Visit (HOSPITAL_COMMUNITY): Payer: Self-pay

## 2022-03-04 DIAGNOSIS — I152 Hypertension secondary to endocrine disorders: Secondary | ICD-10-CM

## 2022-03-04 DIAGNOSIS — R600 Localized edema: Secondary | ICD-10-CM

## 2022-03-04 MED ORDER — NOVOLOG FLEXPEN 100 UNIT/ML ~~LOC~~ SOPN: PEN_INJECTOR | SUBCUTANEOUS | 3 refills | Status: DC

## 2022-03-04 NOTE — Telephone Encounter (Signed)
Requested Prescriptions  ?Pending Prescriptions Disp Refills  ?? rosuvastatin (CRESTOR) 5 MG tablet [Pharmacy Med Name: ROSUVASTATIN CALCIUM 5 MG TAB] 90 tablet 2  ?  Sig: TAKE 1 TABLET (5 MG TOTAL) BY MOUTH DAILY.  ?  ? Cardiovascular:  Antilipid - Statins 2 Failed - 03/04/2022  8:52 AM  ?  ?  Failed - Lipid Panel in normal range within the last 12 months  ?  Cholesterol, Total  ?Date Value Ref Range Status  ?01/07/2022 200 (H) 100 - 199 mg/dL Final  ? ?LDL Chol Calc (NIH)  ?Date Value Ref Range Status  ?01/07/2022 108 (H) 0 - 99 mg/dL Final  ? ?HDL  ?Date Value Ref Range Status  ?01/07/2022 78 >39 mg/dL Final  ? ?Triglycerides  ?Date Value Ref Range Status  ?01/07/2022 77 0 - 149 mg/dL Final  ? ?  ?  ?  Passed - Cr in normal range and within 360 days  ?  Creatinine, Ser  ?Date Value Ref Range Status  ?01/27/2022 0.69 0.57 - 1.00 mg/dL Final  ? ?Creatinine,U  ?Date Value Ref Range Status  ?11/21/2014 40.3 mg/dL Final  ?   ?  ?  Passed - Patient is not pregnant  ?  ?  Passed - Valid encounter within last 12 months  ?  Recent Outpatient Visits   ?      ? 1 week ago Hypertension associated with diabetes ([place])  ? [facility] Thedore Mins, Ria Comment, PA-C  ? 1 month ago Encounter for annual wellness visit (AWV) in Medicare patient  ? [facility] [place], Dionne Bucy, MD  ? 6 months ago Pain and swelling of right ankle  ? [facility] Jerrol Banana., MD  ? 6 months ago Varicose veins of lower extremity with edema, bilateral  ? [facility] Gwyneth Sprout, FNP  ? 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 [facility] [place], Dionne Bucy, MD  ?  ?  ?Future Appointments   ?        ? In 2 months Drubel, Delman [facility], [facility]  ?  ? ?  ?  ?  ? ? ?

## 2022-03-05 DIAGNOSIS — M9903 Segmental and somatic dysfunction of lumbar region: Secondary | ICD-10-CM | POA: Diagnosis not present

## 2022-03-05 DIAGNOSIS — M5387 Other specified dorsopathies, lumbosacral region: Secondary | ICD-10-CM | POA: Diagnosis not present

## 2022-03-05 DIAGNOSIS — M9904 Segmental and somatic dysfunction of sacral region: Secondary | ICD-10-CM | POA: Diagnosis not present

## 2022-03-05 DIAGNOSIS — M9901 Segmental and somatic dysfunction of cervical region: Secondary | ICD-10-CM | POA: Diagnosis not present

## 2022-03-05 DIAGNOSIS — M9902 Segmental and somatic dysfunction of thoracic region: Secondary | ICD-10-CM | POA: Diagnosis not present

## 2022-03-05 DIAGNOSIS — M5383 Other specified dorsopathies, cervicothoracic region: Secondary | ICD-10-CM | POA: Diagnosis not present

## 2022-03-05 DIAGNOSIS — M5136 Other intervertebral disc degeneration, lumbar region: Secondary | ICD-10-CM | POA: Diagnosis not present

## 2022-03-06 DIAGNOSIS — M9903 Segmental and somatic dysfunction of lumbar region: Secondary | ICD-10-CM | POA: Diagnosis not present

## 2022-03-06 DIAGNOSIS — M5387 Other specified dorsopathies, lumbosacral region: Secondary | ICD-10-CM | POA: Diagnosis not present

## 2022-03-06 DIAGNOSIS — M9901 Segmental and somatic dysfunction of cervical region: Secondary | ICD-10-CM | POA: Diagnosis not present

## 2022-03-06 DIAGNOSIS — M5136 Other intervertebral disc degeneration, lumbar region: Secondary | ICD-10-CM | POA: Diagnosis not present

## 2022-03-06 DIAGNOSIS — M9902 Segmental and somatic dysfunction of thoracic region: Secondary | ICD-10-CM | POA: Diagnosis not present

## 2022-03-06 DIAGNOSIS — M9904 Segmental and somatic dysfunction of sacral region: Secondary | ICD-10-CM | POA: Diagnosis not present

## 2022-03-06 DIAGNOSIS — M5383 Other specified dorsopathies, cervicothoracic region: Secondary | ICD-10-CM | POA: Diagnosis not present

## 2022-03-12 DIAGNOSIS — M5136 Other intervertebral disc degeneration, lumbar region: Secondary | ICD-10-CM | POA: Diagnosis not present

## 2022-03-12 DIAGNOSIS — M9902 Segmental and somatic dysfunction of thoracic region: Secondary | ICD-10-CM | POA: Diagnosis not present

## 2022-03-12 DIAGNOSIS — M9904 Segmental and somatic dysfunction of sacral region: Secondary | ICD-10-CM | POA: Diagnosis not present

## 2022-03-12 DIAGNOSIS — M9901 Segmental and somatic dysfunction of cervical region: Secondary | ICD-10-CM | POA: Diagnosis not present

## 2022-03-12 DIAGNOSIS — M9903 Segmental and somatic dysfunction of lumbar region: Secondary | ICD-10-CM | POA: Diagnosis not present

## 2022-03-12 DIAGNOSIS — M5387 Other specified dorsopathies, lumbosacral region: Secondary | ICD-10-CM | POA: Diagnosis not present

## 2022-03-12 DIAGNOSIS — M5383 Other specified dorsopathies, cervicothoracic region: Secondary | ICD-10-CM | POA: Diagnosis not present

## 2022-03-13 ENCOUNTER — Telehealth (INDEPENDENT_AMBULATORY_CARE_PROVIDER_SITE_OTHER): Payer: Self-pay | Admitting: Vascular Surgery

## 2022-03-13 NOTE — Telephone Encounter (Signed)
Pt. Called and wanted to have both legs done at the same time.  She only have the left leg appointment for a laser scheduled.  She states FB told her both legs needs to be done.  Please advise. ?

## 2022-03-13 NOTE — Telephone Encounter (Signed)
Patient was made aware that our providers only do one leg at a time and someone will reach out to her to have the right leg schedule for laser ablation ?

## 2022-03-14 ENCOUNTER — Ambulatory Visit (INDEPENDENT_AMBULATORY_CARE_PROVIDER_SITE_OTHER): Payer: Medicare Other

## 2022-03-14 DIAGNOSIS — R300 Dysuria: Secondary | ICD-10-CM

## 2022-03-14 DIAGNOSIS — E103293 Type 1 diabetes mellitus with mild nonproliferative diabetic retinopathy without macular edema, bilateral: Secondary | ICD-10-CM

## 2022-03-14 DIAGNOSIS — I152 Hypertension secondary to endocrine disorders: Secondary | ICD-10-CM

## 2022-03-14 DIAGNOSIS — E1069 Type 1 diabetes mellitus with other specified complication: Secondary | ICD-10-CM

## 2022-03-14 NOTE — Chronic Care Management (AMB) (Addendum)
Chronic Care Management   CCM RN Visit Note  03/14/2022 Name: Karla Patton MRN: [id number] DOB: 12-28-1950  Subjective: Karla Patton is a 71 y.o. year old female who is a primary care patient of Mikey Kirschner, [place]. The care management team was consulted for assistance with disease management and care coordination needs.    Engaged with patient by telephone for follow up visit in response to provider referral for case management and care coordination services.   Consent to Services:  The patient was given information about Chronic Care Management services, agreed to services, and gave verbal consent prior to initiation of services.  Please see initial visit note for detailed documentation.   Assessment: Review of patient past medical history, allergies, medications, health status, including review of consultants reports, laboratory and other test data, was performed as part of comprehensive evaluation and provision of chronic care management services.   SDOH (Social Determinants of Health) assessments and interventions performed:  No CCM Care Plan  Allergies  Allergen Reactions   Lisinopril Cough    Outpatient Encounter Medications as of 03/14/2022  Medication Sig Note   acetaminophen (TYLENOL) 650 MG CR tablet Take 650 mg by mouth every 8 (eight) hours as needed for pain.    ALPRAZolam (XANAX) 0.5 MG tablet TAKE 1 TABLET(0.5 MG) BY MOUTH once daily as needed    APPLE CIDER VINEGAR PO Take 600 mg by mouth daily.     aspirin EC 81 MG tablet Take 81 mg by mouth daily. Takes at bedtime    beta carotene 25000 UNIT capsule Take 25,000 Units by mouth daily.    Cholecalciferol (VITAMIN D3) 250 MCG (10000 UT) capsule Take 10,000 Units by mouth daily.    CONTOUR NEXT TEST test strip CHECK BLOOD SUGAR BEFORE MEALS AND AT BEDTIME (Patient not taking: Reported on 02/27/2022)    Ferrous Sulfate Dried (HIGH POTENCY IRON) 65 MG TABS Take 1 tablet by mouth daily.    furosemide (LASIX)  20 MG tablet TAKE 1 TABLET BY MOUTH DAILY    insulin aspart (NOVOLOG FLEXPEN) 100 UNIT/ML FlexPen Max daily 30 units    insulin glargine (LANTUS SOLOSTAR) 100 UNIT/ML Solostar Pen Inject 16 Units into the skin daily with breakfast.    insulin lispro (HUMALOG KWIKPEN) 100 UNIT/ML KwikPen Max Daily 30 units    Insulin Pen Needle (BD PEN NEEDLE MICRO U/F) 32G X 6 MM MISC 1 Device by Does not apply route in the morning, at noon, in the evening, and at bedtime.    levothyroxine (SYNTHROID) 100 MCG tablet Take 1 tablet (100 mcg total) by mouth daily before breakfast.    MAGNESIUM-ZINC PO Take 1 tablet by mouth daily.    Multiple Vitamins-Minerals (ABC PLUS SENIOR ADULTS 50+ PO) Take 1 tablet by mouth daily. 02/26/2021: Centrum Silver    omeprazole (PRILOSEC) 40 MG capsule TAKE 1 CAPSULE(40 MG) BY MOUTH DAILY    potassium chloride SA (KLOR-CON M) 20 MEQ tablet Take 20 mEq by mouth every other day.    pregabalin (LYRICA) 225 MG capsule TAKE 1 CAPSULE(225 MG) BY MOUTH DAILY    rosuvastatin (CRESTOR) 5 MG tablet TAKE 1 TABLET (5 MG TOTAL) BY MOUTH DAILY.    No facility-administered encounter medications on file as of 03/14/2022.    Patient Active Problem List   Diagnosis Date Noted   Type 1 diabetes mellitus with hyperglycemia ([place]) 02/27/2022   Type 1 diabetes mellitus with mild nonproliferative retinopathy ([place]) 02/27/2022   Type 1 diabetes mellitus with diabetic  polyneuropathy ([place]) 02/27/2022   Varicose veins of lower extremity with edema, bilateral 08/23/2021   JVD (jugular venous distension) 08/23/2021   Venous insufficiency of both lower extremities 08/23/2021   Hyperlipidemia due to type 1 diabetes mellitus ([place]) 06/13/2021   Anemia 06/13/2021   Obesity 06/13/2021   Spondylolisthesis of lumbar region 03/19/2021   Spondylolisthesis, lumbar region 03/18/2021   Lymphedema 10/30/2019   Type 1 diabetes mellitus with polyneuropathy ([place]) 09/01/2018   Hypertension associated with diabetes ([place])  03/03/2018   Precordial pain 03/03/2018   Headache disorder 02/10/2018   Osteopenia 01/13/2018   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 ([place]) 08/28/2017   Hepatic cyst 08/28/2017   Tinnitus of right ear 08/28/2017   Hearing loss of right ear 08/28/2017   Background diabetic retinopathy ([place]) 05/22/2015   Hypothyroidism 10/10/2014   Diabetic hypoglycemia ([place]) 10/10/2014   Varicose veins of lower extremities with other complications 40/98/1191        Patient Care Plan: RN Care Managment Plan of Care     Problem Identified: DM, HTN, Fall Risk      Long-Range Goal: Disease Progression Prevented or Minimized   Start Date: 03/14/2022  Expected End Date: 06/12/2022  Priority: High  Note:   Current Barriers:  Chronic Disease Management support and education needs related to HTN, DMII and Fall Risk.  RNCM Clinical Goal(s):  Patient will demonstrate Improved adherence to prescribed treatment plan for HTN, DMII and Fall Risk through collaboration with the provider, RN Care manager and the care team.   Interventions: 1:1 collaboration with primary care provider regarding development and update of comprehensive plan of care as evidenced by provider attestation and co-signature Inter-disciplinary care team collaboration (see longitudinal plan of care) Evaluation of current treatment plan related to  self management and patient's adherence to plan as established by provider   Diabetes Interventions:   Assessed patient's understanding of A1c goal: <7% Lab Results  Component Value Date   HGBA1C 7.5 (H) 01/07/2022  Reviewed plan for diabetes management. Reports compliance with medications and treatment plan. Reports most fasting readings have been within range. Reports a few alarms with her Dexcom G6 for elevate readings but overall doing well. Reviewed symptoms. Denied hypoglycemic or hyperglycemic episodes. Reviewed  nutritional intake. Reports doing well and attempting to adhere to the recommended ADA/modified carb diet. Advised to continue completing footcare as recommended and ensure diabetic eye exams are completed as scheduled.   Hypertension Interventions:   Last practice recorded BP readings:  BP Readings from Last 3 Encounters:  02/27/22 (!) 146/96  02/25/22 (!) 146/76  02/20/22 (!) 178/107  Reviewed plan for hypertension management. Reports compliance with medications and treatment plan. Reviewed established blood pressure parameters. Reports readings have been within range. Reports using the BP app on her phone to monitor readings. Denies episodes of chest discomfort, palpitations, headaches, or dizziness.   Discussed nutritional intake. Encouraged to read nutrition labels, monitor sodium intake, and avoid highly processed foods when possible. Reviewed s/sx of heart attack, stroke and worsening symptoms that require immediate medical attention.   Suspected UTI: Discussed concerns related to suspected urinary tract infection. Reports her urine developed a strong odor approximately three days ago. Denies urine discoloration. Denies pain when urinating. Reports taking AZO d/t the supplement being effective in the past.  Offered appointment for urine sampling to r/o UTI. Patient declined. Reports she feels well and will report the Urgent Care if the symptoms worsen over the weekend.  Reviewed worsening symptoms and indications for seeking medical follow up.    Patient Goals/Self-Care Activities: Take all medications as prescribed Attend all scheduled provider appointments Call pharmacy for medication refills 3-7 days in advance of running out of medications Call provider office for new concerns or questions    Follow Up Plan:  Will follow up next week.    PLAN: A member of the care management team will follow up next week.   Cristy Fried[facility] Care Management [facility] [phone]

## 2022-03-17 ENCOUNTER — Ambulatory Visit: Payer: Self-pay

## 2022-03-17 DIAGNOSIS — R300 Dysuria: Secondary | ICD-10-CM

## 2022-03-17 DIAGNOSIS — E1042 Type 1 diabetes mellitus with diabetic polyneuropathy: Secondary | ICD-10-CM

## 2022-03-17 NOTE — Chronic Care Management (AMB) (Addendum)
Chronic Care Management   CCM RN Visit Note  03/17/2022 Name: Karla Patton MRN: [id number] DOB: 01-05-1951  Subjective: Karla Patton is a 71 y.o. year old female who is a primary care patient of Karla Patton, [place]. The care management team was consulted for assistance with disease management and care coordination needs.    Engaged with patient by telephone for follow up visit in response to provider referral for case management and/or care coordination services.   Consent to Services:  The patient was given information about Chronic Care Management services, agreed to services, and gave verbal consent prior to initiation of services.  Please see initial visit note for detailed documentation.   Patient agreed to services and verbal consent obtained.   Assessment: Review of patient past medical history, allergies, medications, health status, including review of consultants reports, laboratory and other test data, was performed as part of comprehensive evaluation and provision of chronic care management services.   SDOH (Social Determinants of Health) assessments and interventions performed:    CCM Care Plan  Allergies  Allergen Reactions   Lisinopril Cough    Outpatient Encounter Medications as of 03/17/2022  Medication Sig Note   acetaminophen (TYLENOL) 650 MG CR tablet Take 650 mg by mouth every 8 (eight) hours as needed for pain.    ALPRAZolam (XANAX) 0.5 MG tablet TAKE 1 TABLET(0.5 MG) BY MOUTH once daily as needed    APPLE CIDER VINEGAR PO Take 600 mg by mouth daily.     aspirin EC 81 MG tablet Take 81 mg by mouth daily. Takes at bedtime    beta carotene 25000 UNIT capsule Take 25,000 Units by mouth daily.    Cholecalciferol (VITAMIN D3) 250 MCG (10000 UT) capsule Take 10,000 Units by mouth daily.    CONTOUR NEXT TEST test strip CHECK BLOOD SUGAR BEFORE MEALS AND AT BEDTIME (Patient not taking: Reported on 02/27/2022)    Ferrous Sulfate Dried (HIGH POTENCY IRON) 65  MG TABS Take 1 tablet by mouth daily.    furosemide (LASIX) 20 MG tablet TAKE 1 TABLET BY MOUTH DAILY    insulin aspart (NOVOLOG FLEXPEN) 100 UNIT/ML FlexPen Max daily 30 units    insulin glargine (LANTUS SOLOSTAR) 100 UNIT/ML Solostar Pen Inject 16 Units into the skin daily with breakfast.    insulin lispro (HUMALOG KWIKPEN) 100 UNIT/ML KwikPen Max Daily 30 units    Insulin Pen Needle (BD PEN NEEDLE MICRO U/F) 32G X 6 MM MISC 1 Device by Does not apply route in the morning, at noon, in the evening, and at bedtime.    levothyroxine (SYNTHROID) 100 MCG tablet Take 1 tablet (100 mcg total) by mouth daily before breakfast.    MAGNESIUM-ZINC PO Take 1 tablet by mouth daily.    Multiple Vitamins-Minerals (ABC PLUS SENIOR ADULTS 50+ PO) Take 1 tablet by mouth daily. 02/26/2021: Centrum Silver    omeprazole (PRILOSEC) 40 MG capsule TAKE 1 CAPSULE(40 MG) BY MOUTH DAILY    potassium chloride SA (KLOR-CON M) 20 MEQ tablet Take 20 mEq by mouth every other day.    pregabalin (LYRICA) 225 MG capsule TAKE 1 CAPSULE(225 MG) BY MOUTH DAILY    rosuvastatin (CRESTOR) 5 MG tablet TAKE 1 TABLET (5 MG TOTAL) BY MOUTH DAILY.    No facility-administered encounter medications on file as of 03/17/2022.    Patient Active Problem List   Diagnosis Date Noted   Type 1 diabetes mellitus with hyperglycemia ([place]) 02/27/2022   Type 1 diabetes mellitus with mild nonproliferative  retinopathy ([place]) 02/27/2022   Type 1 diabetes mellitus with diabetic polyneuropathy ([place]) 02/27/2022   Varicose veins of lower extremity with edema, bilateral 08/23/2021   JVD (jugular venous distension) 08/23/2021   Venous insufficiency of both lower extremities 08/23/2021   Hyperlipidemia due to type 1 diabetes mellitus ([place]) 06/13/2021   Anemia 06/13/2021   Obesity 06/13/2021   Spondylolisthesis of lumbar region 03/19/2021   Spondylolisthesis, lumbar region 03/18/2021   Lymphedema 10/30/2019   Type 1 diabetes mellitus with polyneuropathy ([place])  09/01/2018   Hypertension associated with diabetes ([place]) 03/03/2018   Precordial pain 03/03/2018   Headache disorder 02/10/2018   Osteopenia 01/13/2018   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 ([place]) 08/28/2017   Hepatic cyst 08/28/2017   Tinnitus of right ear 08/28/2017   Hearing loss of right ear 08/28/2017   Background diabetic retinopathy ([place]) 05/22/2015   Hypothyroidism 10/10/2014   Diabetic hypoglycemia ([place]) 10/10/2014   Varicose veins of lower extremities with other complications 78/29/5621       Patient Care Plan: RN Care Managment Plan of Care     Problem Identified: DM, HTN, Fall Risk      Long-Range Goal: Disease Progression Prevented or Minimized   Start Date: 03/14/2022  Expected End Date: 06/12/2022  Priority: High  Note:   Current Barriers:  Chronic Disease Management support and education needs related to HTN, DMII and Fall Risk.  RNCM Clinical Goal(s):  Patient will demonstrate Improved adherence to prescribed treatment plan for HTN, DMII and Fall Risk through collaboration with the provider, RN Care manager and the care team.   Interventions: 1:1 collaboration with primary care provider regarding development and update of comprehensive plan of care as evidenced by provider attestation and co-signature Inter-disciplinary care team collaboration (see longitudinal plan of care) Evaluation of current treatment plan related to  self management and patient's adherence to plan as established by provider   Diabetes Interventions:   Assessed patient's understanding of A1c goal: <7% Lab Results  Component Value Date   HGBA1C 7.5 (H) 01/07/2022  Reviewed plan for diabetes management. Reports compliance with medications and treatment plan. Reports most fasting readings have been within range. Reports a few alarms with her Dexcom G6 for elevate readings but overall doing well. Reviewed symptoms. Denied  hypoglycemic or hyperglycemic episodes. Reviewed nutritional intake. Reports doing well and attempting to adhere to the recommended ADA/modified carb diet. Advised to continue completing footcare as recommended and ensure diabetic eye exams are completed as scheduled.   Hypertension Interventions:   Last practice recorded BP readings:  BP Readings from Last 3 Encounters:  02/27/22 (!) 146/96  02/25/22 (!) 146/76  02/20/22 (!) 178/107  Reviewed plan for hypertension management. Reports compliance with medications and treatment plan. Reviewed established blood pressure parameters. Reports readings have been within range. Reports using the BP app on her phone to monitor readings. Denies episodes of chest discomfort, palpitations, headaches, or dizziness.   Discussed nutritional intake. Encouraged to read nutrition labels, monitor sodium intake, and avoid highly processed foods when possible. Reviewed s/sx of heart attack, stroke and worsening symptoms that require immediate medical attention.   Suspected UTI: Discussed concerns related to suspected urinary tract infection. Reports her urine developed a strong odor approximately three days ago. Denies urine discoloration. Denies pain when urinating. Reports taking AZO d/t the supplement being effective in the past.  Offered appointment for urine sampling to r/o UTI. Patient declined. Reports she feels well and will report  the Urgent Care if the symptoms worsen over the weekend.  Reviewed worsening symptoms and indications for seeking medical follow up. Update  03/17/22: Reviewed symptoms and recommendations for follow up. Feels that symptoms have resolved, however she is planning for a beach trip. Declines need for clinic follow up or lab/urine testing. Thorough discussion regarding importance of monitoring symptoms d/t history of UTI. Agreed to seek assistance in the nearest Urgent Care if symptoms return and worsen while she is out of town.    Patient Goals/Self-Care Activities: Take all medications as prescribed Attend all scheduled provider appointments Call pharmacy for medication refills 3-7 days in advance of running out of medications Call provider office for new concerns or questions    Follow Up Plan:  Will follow up in three months       PLAN A member of the care management team will follow up in three months.   Cristy [facility] Care Management [facility] [phone]

## 2022-03-18 DIAGNOSIS — M9901 Segmental and somatic dysfunction of cervical region: Secondary | ICD-10-CM | POA: Diagnosis not present

## 2022-03-18 DIAGNOSIS — M5383 Other specified dorsopathies, cervicothoracic region: Secondary | ICD-10-CM | POA: Diagnosis not present

## 2022-03-18 DIAGNOSIS — M5136 Other intervertebral disc degeneration, lumbar region: Secondary | ICD-10-CM | POA: Diagnosis not present

## 2022-03-18 DIAGNOSIS — M9902 Segmental and somatic dysfunction of thoracic region: Secondary | ICD-10-CM | POA: Diagnosis not present

## 2022-03-18 DIAGNOSIS — M9903 Segmental and somatic dysfunction of lumbar region: Secondary | ICD-10-CM | POA: Diagnosis not present

## 2022-03-18 DIAGNOSIS — M5387 Other specified dorsopathies, lumbosacral region: Secondary | ICD-10-CM | POA: Diagnosis not present

## 2022-03-18 DIAGNOSIS — M9904 Segmental and somatic dysfunction of sacral region: Secondary | ICD-10-CM | POA: Diagnosis not present

## 2022-03-21 DIAGNOSIS — E103293 Type 1 diabetes mellitus with mild nonproliferative diabetic retinopathy without macular edema, bilateral: Secondary | ICD-10-CM | POA: Diagnosis not present

## 2022-03-21 DIAGNOSIS — E1159 Type 2 diabetes mellitus with other circulatory complications: Secondary | ICD-10-CM

## 2022-03-21 DIAGNOSIS — E1042 Type 1 diabetes mellitus with diabetic polyneuropathy: Secondary | ICD-10-CM

## 2022-03-21 DIAGNOSIS — I152 Hypertension secondary to endocrine disorders: Secondary | ICD-10-CM | POA: Diagnosis not present

## 2022-03-25 DIAGNOSIS — M5383 Other specified dorsopathies, cervicothoracic region: Secondary | ICD-10-CM | POA: Diagnosis not present

## 2022-03-25 DIAGNOSIS — M9901 Segmental and somatic dysfunction of cervical region: Secondary | ICD-10-CM | POA: Diagnosis not present

## 2022-03-25 DIAGNOSIS — M9902 Segmental and somatic dysfunction of thoracic region: Secondary | ICD-10-CM | POA: Diagnosis not present

## 2022-03-25 DIAGNOSIS — M9903 Segmental and somatic dysfunction of lumbar region: Secondary | ICD-10-CM | POA: Diagnosis not present

## 2022-03-25 DIAGNOSIS — M5136 Other intervertebral disc degeneration, lumbar region: Secondary | ICD-10-CM | POA: Diagnosis not present

## 2022-03-25 DIAGNOSIS — M5387 Other specified dorsopathies, lumbosacral region: Secondary | ICD-10-CM | POA: Diagnosis not present

## 2022-03-25 DIAGNOSIS — M9904 Segmental and somatic dysfunction of sacral region: Secondary | ICD-10-CM | POA: Diagnosis not present

## 2022-03-27 ENCOUNTER — Telehealth: Payer: Self-pay

## 2022-03-27 ENCOUNTER — Other Ambulatory Visit: Payer: Self-pay | Admitting: Family Medicine

## 2022-03-27 ENCOUNTER — Other Ambulatory Visit: Payer: Self-pay | Admitting: Physician Assistant

## 2022-03-27 NOTE — Telephone Encounter (Signed)
Requested medication (s) are due for refill today: Yes ? ?Requested medication (s) are on the active medication list: No ? ?Last refill:  Unknown ? ?Future visit scheduled: No ? ?Notes to clinic:  Unable to refill per protocol, medication not on current med list ? ? ? ? ?Requested Prescriptions  ?Pending Prescriptions Disp Refills  ? fluticasone (FLONASE) 50 MCG/ACT nasal spray [Pharmacy Med Name: FLUTICASONE PROP 50 MCG SPRAY] 48 mL 1  ?  Sig: INSTILL 2 SPRAYS INTO EACH NOSTRIL DAILY  ?  ? Not Delegated - Ear, Nose, and Throat: Nasal Preparations - Corticosteroids Failed - 03/27/2022  8:39 AM  ?  ?  Failed - This refill cannot be delegated  ?  ?  Passed - Valid encounter within last 12 months  ?  Recent Outpatient Visits   ? ?      ? 1 month ago Hypertension associated with diabetes ([place])  ? [facility] Thedore Mins, Ria Comment, PA-C  ? 2 months ago Encounter for annual wellness visit (AWV) in Medicare patient  ? [facility] [place], Dionne Bucy, MD  ? 6 months ago Pain and swelling of right ankle  ? [facility] Jerrol Banana., MD  ? 7 months ago Varicose veins of lower extremity with edema, bilateral  ? [facility] Gwyneth Sprout, FNP  ? 9 months ago 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 (HCC)  ? [facility] [place], Dionne Bucy, MD  ? ?  ?  ?Future Appointments   ? ?        ? In 2 months Drubel, Delman Cheadle [facility], PEC  ? ?  ? ?  ?  ?  ? ? ? ? ?

## 2022-03-27 NOTE — Telephone Encounter (Signed)
Copied from [place] [id number]. Topic: General - Other ?>> Mar 27, 2022  8:43 AM Tessa Lerner A wrote: ?Reason for CRM: [facility] will be [facility] for the patient's Dexcom G6 supplies  ? ?The patient would like to be made aware of when this request is completed ? ?The patient's preferred pharmacy, has shared that they will be contacting the practice shortly  ? ?[id number] [place] [facility] [address] [id number]?Phone: [phone]: [phone]?Hours: Not open 24 hours ? ?Please contact further if needed ?

## 2022-04-02 ENCOUNTER — Encounter: Payer: Self-pay | Admitting: Physician Assistant

## 2022-04-04 NOTE — Telephone Encounter (Signed)
Patient called in to inform Mikey Kirschner that she would like Rx for 90 day supply to be sent to [facility] in [place] for the Continuous Blood Gluc Sensor ([place]) MISC and Dexcom Transmitter Can be reached at Ph# [phone] ?

## 2022-04-07 ENCOUNTER — Telehealth: Payer: Self-pay

## 2022-04-07 NOTE — Telephone Encounter (Signed)
Dexcom order has been sent through [facility] ?

## 2022-04-08 ENCOUNTER — Other Ambulatory Visit: Payer: Self-pay

## 2022-04-08 ENCOUNTER — Other Ambulatory Visit: Payer: Self-pay | Admitting: Physician Assistant

## 2022-04-08 MED ORDER — DEXCOM G6 SENSOR MISC: 5 refills | Status: DC

## 2022-04-09 ENCOUNTER — Telehealth (INDEPENDENT_AMBULATORY_CARE_PROVIDER_SITE_OTHER): Payer: Self-pay | Admitting: Vascular Surgery

## 2022-04-09 NOTE — Telephone Encounter (Signed)
Patient called and wanted to know if she could send her driver up to pick up the package for her before the surgery tomorrow.  I asked her if it was a package or a prescription to be picked up at the pharmacy but she stated [facility] at [facility]am this morning and they did not have a prescription for her.  Please advise. ?

## 2022-04-10 ENCOUNTER — Encounter (INDEPENDENT_AMBULATORY_CARE_PROVIDER_SITE_OTHER): Payer: Self-pay | Admitting: Vascular Surgery

## 2022-04-10 ENCOUNTER — Ambulatory Visit (INDEPENDENT_AMBULATORY_CARE_PROVIDER_SITE_OTHER): Payer: Medicare Other | Admitting: Vascular Surgery

## 2022-04-10 VITALS — BP 150/74 | HR 82 | Resp 16 | Wt 181.2 lb

## 2022-04-10 DIAGNOSIS — I83812 Varicose veins of left lower extremities with pain: Secondary | ICD-10-CM

## 2022-04-10 DIAGNOSIS — I83819 Varicose veins of unspecified lower extremities with pain: Secondary | ICD-10-CM

## 2022-04-10 NOTE — Progress Notes (Signed)
? ? ?  MRN : [id number] ? ?Karla Patton is a 71 y.o. (05/30/1951) female who presents with chief complaint of painful varicose veins. ? ? ? ?The patient's left lower extremity was sterilely prepped and draped.  The ultrasound machine was used to visualize the left great saphenous vein throughout its course.  A segment in the mid thigh was selected for access.  The saphenous vein was accessed without difficulty using ultrasound guidance with a micropuncture needle.   An 0.018  wire was placed beyond the saphenofemoral junction through the sheath and the microneedle was removed.  The 65 cm sheath was then placed over the wire and the wire and dilator were removed.  The laser fiber was placed through the sheath and its tip was placed approximately 2 cm below the saphenofemoral junction.  Tumescent anesthesia was then created with a dilute lidocaine solution.  Laser energy was then delivered with constant withdrawal of the sheath and laser fiber.  Approximately 680 Joules of energy were delivered over a length of 10 cm.  Sterile dressings were placed.  The patient tolerated the procedure well without complications.  ?

## 2022-04-15 ENCOUNTER — Other Ambulatory Visit (INDEPENDENT_AMBULATORY_CARE_PROVIDER_SITE_OTHER): Payer: Self-pay | Admitting: Vascular Surgery

## 2022-04-15 DIAGNOSIS — I83819 Varicose veins of unspecified lower extremities with pain: Secondary | ICD-10-CM

## 2022-04-15 DIAGNOSIS — M9901 Segmental and somatic dysfunction of cervical region: Secondary | ICD-10-CM | POA: Diagnosis not present

## 2022-04-15 DIAGNOSIS — M9903 Segmental and somatic dysfunction of lumbar region: Secondary | ICD-10-CM | POA: Diagnosis not present

## 2022-04-15 DIAGNOSIS — M5136 Other intervertebral disc degeneration, lumbar region: Secondary | ICD-10-CM | POA: Diagnosis not present

## 2022-04-15 DIAGNOSIS — M5383 Other specified dorsopathies, cervicothoracic region: Secondary | ICD-10-CM | POA: Diagnosis not present

## 2022-04-15 DIAGNOSIS — M5387 Other specified dorsopathies, lumbosacral region: Secondary | ICD-10-CM | POA: Diagnosis not present

## 2022-04-15 DIAGNOSIS — M9902 Segmental and somatic dysfunction of thoracic region: Secondary | ICD-10-CM | POA: Diagnosis not present

## 2022-04-15 DIAGNOSIS — M9904 Segmental and somatic dysfunction of sacral region: Secondary | ICD-10-CM | POA: Diagnosis not present

## 2022-04-17 ENCOUNTER — Ambulatory Visit (INDEPENDENT_AMBULATORY_CARE_PROVIDER_SITE_OTHER): Payer: Medicare Other

## 2022-04-17 DIAGNOSIS — I83819 Varicose veins of unspecified lower extremities with pain: Secondary | ICD-10-CM | POA: Diagnosis not present

## 2022-04-25 ENCOUNTER — Ambulatory Visit: Payer: Medicare Other | Admitting: Family Medicine

## 2022-05-13 DIAGNOSIS — M5383 Other specified dorsopathies, cervicothoracic region: Secondary | ICD-10-CM | POA: Diagnosis not present

## 2022-05-13 DIAGNOSIS — M9902 Segmental and somatic dysfunction of thoracic region: Secondary | ICD-10-CM | POA: Diagnosis not present

## 2022-05-13 DIAGNOSIS — M9903 Segmental and somatic dysfunction of lumbar region: Secondary | ICD-10-CM | POA: Diagnosis not present

## 2022-05-13 DIAGNOSIS — M9904 Segmental and somatic dysfunction of sacral region: Secondary | ICD-10-CM | POA: Diagnosis not present

## 2022-05-13 DIAGNOSIS — M5387 Other specified dorsopathies, lumbosacral region: Secondary | ICD-10-CM | POA: Diagnosis not present

## 2022-05-13 DIAGNOSIS — M5136 Other intervertebral disc degeneration, lumbar region: Secondary | ICD-10-CM | POA: Diagnosis not present

## 2022-05-13 DIAGNOSIS — M9901 Segmental and somatic dysfunction of cervical region: Secondary | ICD-10-CM | POA: Diagnosis not present

## 2022-05-20 DIAGNOSIS — E7800 Pure hypercholesterolemia, unspecified: Secondary | ICD-10-CM | POA: Insufficient documentation

## 2022-05-20 NOTE — Progress Notes (Signed)
MRN : [id number]  Karla Patton is a 71 y.o. (04-09-1951) female who presents with chief complaint of s/p laser.  History of Present Illness:   The patient returns to the office for followup status post laser ablation of the left great saphenous vein on 04/10/2022.   The patient notes multiple residual varicosities bilaterally which continued to hurt with dependent positions and remained tender to palpation. The patient's swelling is unchanged from preoperative status. The patient continues to wear graduated compression stockings on a daily basis but these are not eliminating the pain and discomfort. The patient continues to use over-the-counter anti-inflammatory medications to treat the pain and related symptoms but this has not given the patient relief. The patient notes the pain in the lower extremities is causing problems with daily exercise, problems at work and even with household activities such as preparing meals and doing dishes.  The patient is otherwise done well and there have been no complications related to the laser procedure or interval changes in the patient's overall   Venous ultrasound post laser shows successful laser ablation of the left great saphenous vein, no DVT identified.   No outpatient medications have been marked as taking for the 05/22/22 encounter (Appointment) with Delana Meyer, Dolores Lory, MD.    Past Medical History:  Diagnosis Date   Anemia    Anxiety    Arthritis    Back pain    Diabetes mellitus without complication ([place])    type 1   GERD (gastroesophageal reflux disease)    Hypothyroidism    Thyroid disease    Varicose vein     Past Surgical History:  Procedure Laterality Date   ABDOMINAL HYSTERECTOMY  1994   CHOLECYSTECTOMY  09-07-13   COLONOSCOPY  2009   normal   COLONOSCOPY WITH PROPOFOL N/A 10/20/2018   Procedure: COLONOSCOPY WITH PROPOFOL;  Surgeon: Robert Bellow, MD;  Location: [facility] ENDOSCOPY;  Service: Endoscopy;   Laterality: N/A;   INCONTINENCE SURGERY  2003   left leg vein ligation  2015   VARICOSE VEIN SURGERY  1990's   Dr Bary Castilla   VEIN SURGERY Left 11/17/2017   vein closure procedure/Dr Jamal Collin    Social History Social History   Tobacco Use   Smoking status: Never   Smokeless tobacco: Never  Vaping Use   Vaping Use: Never used  Substance Use Topics   Alcohol use: Yes    Alcohol/week: 7.0 standard drinks    Types: 7 Shots of liquor per week    Comment: vodka 1 drink nightly  and/ or wine   Drug use: No    Family History Family History  Problem Relation Age of Onset   Diabetes Mother    Pancreatitis Mother    Heart disease Father    Stroke Father        x 2   Hypertension Father    Fibromyalgia Sister    Scoliosis Sister    Arthritis Sister    Diabetes Sister    Breast cancer Neg Hx     Allergies  Allergen Reactions   Lisinopril Cough     REVIEW OF SYSTEMS (Negative unless checked)  Constitutional: '[]'$ Weight loss  '[]'$ Fever  '[]'$ Chills Cardiac: '[]'$ Chest pain   '[]'$ Chest pressure   '[]'$ Palpitations   '[]'$ Shortness of breath when laying flat   '[]'$ Shortness of breath with exertion. Vascular:  '[]'$ Pain in legs with walking   '[x]'$ Pain in legs at rest  '[]'$ History of DVT   '[]'$ Phlebitis   '[x]'$ Swelling in  legs   '[x]'$ Varicose veins   '[]'$ Non-healing ulcers Pulmonary:   '[]'$ Uses home oxygen   '[]'$ Productive cough   '[]'$ Hemoptysis   '[]'$ Wheeze  '[]'$ COPD   '[]'$ Asthma Neurologic:  '[]'$ Dizziness   '[]'$ Seizures   '[]'$ History of stroke   '[]'$ History of TIA  '[]'$ Aphasia   '[]'$ Vissual changes   '[]'$ Weakness or numbness in arm   '[]'$ Weakness or numbness in leg Musculoskeletal:   '[]'$ Joint swelling   '[]'$ Joint pain   '[]'$ Low back pain Hematologic:  '[]'$ Easy bruising  '[]'$ Easy bleeding   '[]'$ Hypercoagulable state   '[]'$ Anemic Gastrointestinal:  '[]'$ Diarrhea   '[]'$ Vomiting  '[]'$ Gastroesophageal reflux/heartburn   '[]'$ Difficulty swallowing. Genitourinary:  '[]'$ Chronic kidney disease   '[]'$ Difficult urination  '[]'$ Frequent urination   '[]'$ Blood in urine Skin:  '[]'$ Rashes    '[]'$ Ulcers  Psychological:  '[]'$ History of anxiety   '[]'$  History of major depression.  Physical Examination  There were no vitals filed for this visit. There is no height or weight on file to calculate BMI. Gen: WD/WN, NAD Head: [place]/AT, No temporalis wasting.  Ear/Nose/Throat: Hearing grossly intact, nares w/o erythema or drainage, pinna without lesions Eyes: PER, EOMI, sclera nonicteric.  Neck: Supple, no gross masses.  No JVD.  Pulmonary:  Good air movement, no audible wheezing, no use of accessory muscles.  Cardiac: RRR, precordium not hyperdynamic. Vascular:  scattered varicosities present bilaterally with greater than 10 mm residual varicosities noted on the left.  Mild Ehmann venous stasis changes to the legs bilaterally.  2+ soft pitting edema  Vessel Right Left  Radial Palpable Palpable  Gastrointestinal: soft, non-distended. No guarding/no peritoneal signs.  Musculoskeletal: M/S 5/5 throughout.  No deformity.  Neurologic: CN 2-12 intact. Pain and light touch intact in extremities.  Symmetrical.  Speech is fluent. Motor exam as listed above. Psychiatric: Judgment intact, Mood & affect appropriate for pt's clinical situation. Dermatologic: Venous rashes no ulcers noted.  No changes consistent with cellulitis. Lymph : No lichenification or skin changes of chronic lymphedema.  CBC Lab Results  Component Value Date   WBC 7.4 06/13/2021   HGB 11.3 06/13/2021   HCT 34.5 06/13/2021   MCV 85 06/13/2021   PLT 440 06/13/2021    BMET    Component Value Date/Time   NA 130 (L) 01/27/2022 0913   K 5.2 01/27/2022 0913   CL 89 (L) 01/27/2022 0913   CO2 26 01/27/2022 0913   GLUCOSE 152 (H) 01/27/2022 0913   GLUCOSE 347 (H) 03/19/2021 0420   BUN 11 01/27/2022 0913   CREATININE 0.69 01/27/2022 0913   CALCIUM 9.5 01/27/2022 0913   GFRNONAA >60 03/19/2021 0420   GFRAA 95 01/28/2021 1129   CrCl cannot be calculated (Patient's most recent lab result is older than the maximum 21 days  allowed.).  COAG No results found for: INR, PROTIME  Radiology No results found.   Assessment/Plan 1. Varicose veins with pain Recommend:  The patient has had successful ablation of the previously incompetent saphenous venous system but still has persistent symptoms of pain and swelling that are having a negative impact on daily life and daily activities.  Patient should undergo injection sclerotherapy to treat the residual varicosities.  The risks, benefits and alternative therapies were reviewed in detail with the patient.  All questions were answered.  The patient agrees to proceed with sclerotherapy at their convenience.  The patient will continue wearing the graduated compression stockings and using the over-the-counter pain medications to treat her symptoms.       2. Venous insufficiency of both lower extremities Recommend:  The patient has had successful ablation of the previously incompetent saphenous venous system but still has persistent symptoms of pain and swelling that are having a negative impact on daily life and daily activities.  Patient should undergo injection sclerotherapy to treat the residual varicosities.  The risks, benefits and alternative therapies were reviewed in detail with the patient.  All questions were answered.  The patient agrees to proceed with sclerotherapy at their convenience.  The patient will continue wearing the graduated compression stockings and using the over-the-counter pain medications to treat her symptoms.       3. Essential hypertension Continue antihypertensive medications as already ordered, these medications have been reviewed and there are no changes at this time.   4. Type 1 diabetes mellitus with polyneuropathy ([place]) Continue hypoglycemic medications as already ordered, these medications have been reviewed and there are no changes at this time.  Hgb A1C to be monitored as already arranged by primary service   5. Pure  hypercholesterolemia Continue statin as ordered and reviewed, no changes at this time     Hortencia Pilar, MD  05/20/2022 10:29 AM

## 2022-05-22 ENCOUNTER — Ambulatory Visit (INDEPENDENT_AMBULATORY_CARE_PROVIDER_SITE_OTHER): Payer: Medicare Other | Admitting: Vascular Surgery

## 2022-05-22 ENCOUNTER — Encounter (INDEPENDENT_AMBULATORY_CARE_PROVIDER_SITE_OTHER): Payer: Self-pay | Admitting: Vascular Surgery

## 2022-05-22 VITALS — BP 177/74 | HR 71 | Resp 17 | Ht 65.0 in | Wt 182.0 lb

## 2022-05-22 DIAGNOSIS — I10 Essential (primary) hypertension: Secondary | ICD-10-CM

## 2022-05-22 DIAGNOSIS — M47816 Spondylosis without myelopathy or radiculopathy, lumbar region: Secondary | ICD-10-CM | POA: Insufficient documentation

## 2022-05-22 DIAGNOSIS — G8929 Other chronic pain: Secondary | ICD-10-CM | POA: Insufficient documentation

## 2022-05-22 DIAGNOSIS — E1042 Type 1 diabetes mellitus with diabetic polyneuropathy: Secondary | ICD-10-CM

## 2022-05-22 DIAGNOSIS — I872 Venous insufficiency (chronic) (peripheral): Secondary | ICD-10-CM | POA: Diagnosis not present

## 2022-05-22 DIAGNOSIS — M545 Low back pain, unspecified: Secondary | ICD-10-CM | POA: Insufficient documentation

## 2022-05-22 DIAGNOSIS — I83819 Varicose veins of unspecified lower extremities with pain: Secondary | ICD-10-CM

## 2022-05-22 DIAGNOSIS — M48061 Spinal stenosis, lumbar region without neurogenic claudication: Secondary | ICD-10-CM | POA: Insufficient documentation

## 2022-05-22 DIAGNOSIS — E7800 Pure hypercholesterolemia, unspecified: Secondary | ICD-10-CM

## 2022-05-24 ENCOUNTER — Other Ambulatory Visit: Payer: Self-pay | Admitting: Family Medicine

## 2022-05-24 DIAGNOSIS — E1140 Type 2 diabetes mellitus with diabetic neuropathy, unspecified: Secondary | ICD-10-CM

## 2022-05-25 ENCOUNTER — Encounter (INDEPENDENT_AMBULATORY_CARE_PROVIDER_SITE_OTHER): Payer: Self-pay | Admitting: Vascular Surgery

## 2022-05-27 ENCOUNTER — Encounter: Payer: Self-pay | Admitting: Family Medicine

## 2022-05-27 ENCOUNTER — Ambulatory Visit (INDEPENDENT_AMBULATORY_CARE_PROVIDER_SITE_OTHER): Payer: Medicare Other | Admitting: Family Medicine

## 2022-05-27 VITALS — BP 140/68 | HR 84 | Temp 97.4°F | Ht 63.0 in | Wt 179.1 lb

## 2022-05-27 DIAGNOSIS — E1069 Type 1 diabetes mellitus with other specified complication: Secondary | ICD-10-CM

## 2022-05-27 DIAGNOSIS — I10 Essential (primary) hypertension: Secondary | ICD-10-CM | POA: Diagnosis not present

## 2022-05-27 DIAGNOSIS — E1140 Type 2 diabetes mellitus with diabetic neuropathy, unspecified: Secondary | ICD-10-CM

## 2022-05-27 DIAGNOSIS — I83893 Varicose veins of bilateral lower extremities with other complications: Secondary | ICD-10-CM | POA: Diagnosis not present

## 2022-05-27 DIAGNOSIS — E113299 Type 2 diabetes mellitus with mild nonproliferative diabetic retinopathy without macular edema, unspecified eye: Secondary | ICD-10-CM | POA: Diagnosis not present

## 2022-05-27 DIAGNOSIS — K219 Gastro-esophageal reflux disease without esophagitis: Secondary | ICD-10-CM | POA: Insufficient documentation

## 2022-05-27 DIAGNOSIS — R011 Cardiac murmur, unspecified: Secondary | ICD-10-CM | POA: Insufficient documentation

## 2022-05-27 DIAGNOSIS — E785 Hyperlipidemia, unspecified: Secondary | ICD-10-CM | POA: Diagnosis not present

## 2022-05-27 DIAGNOSIS — E2839 Other primary ovarian failure: Secondary | ICD-10-CM

## 2022-05-27 DIAGNOSIS — E039 Hypothyroidism, unspecified: Secondary | ICD-10-CM

## 2022-05-27 DIAGNOSIS — E1042 Type 1 diabetes mellitus with diabetic polyneuropathy: Secondary | ICD-10-CM | POA: Diagnosis not present

## 2022-05-27 DIAGNOSIS — M8580 Other specified disorders of bone density and structure, unspecified site: Secondary | ICD-10-CM | POA: Diagnosis not present

## 2022-05-27 DIAGNOSIS — Z794 Long term (current) use of insulin: Secondary | ICD-10-CM

## 2022-05-27 MED ORDER — ROSUVASTATIN CALCIUM 10 MG PO TABS: 10.0000 mg | ORAL_TABLET | Freq: Every day | ORAL | 1 refills | Status: DC

## 2022-05-27 MED ORDER — PREGABALIN ER 165 MG PO TB24: 165.0000 | ORAL_TABLET | Freq: Every evening | ORAL | 0 refills | Status: DC

## 2022-05-27 NOTE — Assessment & Plan Note (Signed)
Lab Results  Component Value Date   LDLCALC 108 (H) 01/07/2022   Started crestor 5 mg in August. Will increase as not at goal.

## 2022-05-27 NOTE — Assessment & Plan Note (Signed)
Lab Results  Component Value Date   TSH 1.450 01/07/2022   On levothyroxine 100 mcg. Controled. Cont medication

## 2022-05-27 NOTE — Assessment & Plan Note (Signed)
Follows with vascular. Taking lasix and potassium to help with swelling. Controlled.   Lab Results  Component Value Date   K 5.2 01/27/2022

## 2022-05-27 NOTE — Assessment & Plan Note (Signed)
Follows with ophthalmology

## 2022-05-27 NOTE — Assessment & Plan Note (Signed)
No prior echo. Systolic. On lasix w hx of edema 2/2 to varicose veins. Discussed evaluating for severity. Currently asymptomatic.

## 2022-05-27 NOTE — Assessment & Plan Note (Signed)
BP controlled off medication on home monitoring. Suspect white coat htn

## 2022-05-27 NOTE — Assessment & Plan Note (Signed)
Advised trying to reduce omeprazole as able due to impact on bone health.

## 2022-05-27 NOTE — Patient Instructions (Addendum)
Increase crestor Try long acting lyrica if covered, if not will do split dosing    Your DEXA showed osteopenia.   This means that she is at risk for developing osteoporosis and have some signs of low bone mass.   Would recommend the following:   1) 800 units of Vitamin D daily 2) Get 1200 mg of elemental calcium --- this is best from your diet. Try to track how much calcium you get on a typical day. You could find ways to add more (dairy products, leafy greens). Take a supplement for whatever you don't typically get so you reach 1200 mg of calcium.  3) Physical activity (ideally weight bearing) - like walking briskly 30 minutes 5 days a week.   Osteoporosis increases your risk of having a fracture.   Bisphosphonate therapy is the first line treatment for prevention of fractures.   Alendronate (daily or weekly) Risedronate (daily or weekly) Zoledronic acid (IV 5 mg yearly)  Common Side effects: Heartburn and Acid Reflux symptoms  Less common:  1) Osteonecrosis of the Jaw - presents with jaw pain, swelling, local infection 2) Femur Fracture 3) Ocular Side effects - pain, blurred vision, conjunctivitis

## 2022-05-27 NOTE — Progress Notes (Signed)
Subjective:     Karla Patton is a 71 y.o. female presenting for Establish Care     HPI  #HTN/White coat - not on medication - well controlled at home - no cp, dizziness - does have a HA this morning - has some drainage  #HLD - started meds in August  #T1 DM - sugars at home - sees endocrine  #worry - no longer taking    Review of Systems  Constitutional:  Positive for fatigue.  Respiratory:  Negative for shortness of breath.     Social History   Tobacco Use  Smoking Status Never  Smokeless Tobacco Never        Objective:    BP Readings from Last 3 Encounters:  05/27/22 140/68  05/22/22 (!) 177/74  04/10/22 (!) 150/74   Wt Readings from Last 3 Encounters:  05/27/22 179 lb 2 oz (81.3 kg)  05/22/22 182 lb (82.6 kg)  04/10/22 181 lb 3.2 oz (82.2 kg)    BP 140/68   Pulse 84   Temp (!) 97.4 F (36.3 C) (Temporal)   Ht '5\' 3"'$  (1.6 m)   Wt 179 lb 2 oz (81.3 kg)   SpO2 97%   BMI 31.73 kg/m    Physical Exam Constitutional:      General: She is not in acute distress.    Appearance: She is well-developed. She is not diaphoretic.  HENT:     Right Ear: External ear normal.     Left Ear: External ear normal.     Nose: Nose normal.  Eyes:     Conjunctiva/sclera: Conjunctivae normal.  Cardiovascular:     Rate and Rhythm: Normal rate and regular rhythm.     Heart sounds: Murmur heard.  Pulmonary:     Effort: Pulmonary effort is normal. No respiratory distress.     Breath sounds: Normal breath sounds. No wheezing.  Musculoskeletal:     Cervical back: Neck supple.  Skin:    General: Skin is warm and dry.     Capillary Refill: Capillary refill takes less than 2 seconds.  Neurological:     Mental Status: She is alert. Mental status is at baseline.  Psychiatric:        Mood and Affect: Mood normal.        Behavior: Behavior normal.          Assessment & Plan:   Problem List Items Addressed This Visit       Cardiovascular and  Mediastinum   Essential hypertension    BP controlled off medication on home monitoring. Suspect white coat htn       Relevant Medications   rosuvastatin (CRESTOR) 10 MG tablet   Varicose veins of lower extremity with edema, bilateral    Follows with vascular. Taking lasix and potassium to help with swelling. Controlled.   Lab Results  Component Value Date   K 5.2 01/27/2022         Relevant Medications   rosuvastatin (CRESTOR) 10 MG tablet     Digestive   Gastroesophageal reflux disease without esophagitis    Advised trying to reduce omeprazole as able due to impact on bone health.          Endocrine   Hypothyroidism    Lab Results  Component Value Date   TSH 1.450 01/07/2022  On levothyroxine 100 mcg. Controled. Cont medication      Background diabetic retinopathy ([place])    Follows with ophthalmology  Relevant Medications   rosuvastatin (CRESTOR) 10 MG tablet   Hyperlipidemia due to type 1 diabetes mellitus [facility])    Lab Results  Component Value Date   LDLCALC 108 (H) 01/07/2022  Started crestor 5 mg in August. Will increase as not at goal.       Relevant Medications   rosuvastatin (CRESTOR) 10 MG tablet   Type 1 diabetes mellitus with diabetic polyneuropathy ([place]) - Primary    Lab Results  Component Value Date   HGBA1C 7.5 (H) 01/07/2022  Has Dexcom. Taking insulin 16 units lantus and then sliding scale. Cont endocrine support. Neuropathy with limited control. Will try lyrica long acting if covered, if not will switch to twice daily dosing.       Relevant Medications   rosuvastatin (CRESTOR) 10 MG tablet   Pregabalin ER (LYRICA CR) 165 MG TB24     Musculoskeletal and Integument   Osteopenia    5 years since last dexa. Fracture last year. Discussed briefly she may benefit from bisphosphonate therapy. Advised repeat DEXA first. Handout for treatment and prevention provided. F/u 3 months after dexa       Relevant Orders   DG Bone Density      Other   Murmur    No prior echo. Systolic. On lasix w hx of edema 2/2 to varicose veins. Discussed evaluating for severity. Currently asymptomatic.        Relevant Orders   ECHOCARDIOGRAM COMPLETE   Other Visit Diagnoses     Type 2 diabetes mellitus with diabetic neuropathy, with long-term current use of insulin (HCC)       Relevant Medications   rosuvastatin (CRESTOR) 10 MG tablet   Pregabalin ER (LYRICA CR) 165 MG TB24   Estrogen deficiency       Relevant Orders   DG Bone Density        Return in about 3 months (around 08/27/2022) for cholesterol.  Lesleigh Noe, MD

## 2022-05-27 NOTE — Assessment & Plan Note (Signed)
5 years since last dexa. Fracture last year. Discussed briefly she may benefit from bisphosphonate therapy. Advised repeat DEXA first. Handout for treatment and prevention provided. F/u 3 months after dexa

## 2022-05-27 NOTE — Assessment & Plan Note (Addendum)
Lab Results  Component Value Date   HGBA1C 7.5 (H) 01/07/2022  Has Dexcom. Taking insulin 16 units lantus and then sliding scale. Cont endocrine support. Neuropathy with limited control. Will try lyrica long acting if covered, if not will switch to twice daily dosing.

## 2022-05-28 ENCOUNTER — Ambulatory Visit: Payer: Medicare Other | Admitting: Physician Assistant

## 2022-05-28 ENCOUNTER — Other Ambulatory Visit: Payer: Self-pay

## 2022-05-28 DIAGNOSIS — E1140 Type 2 diabetes mellitus with diabetic neuropathy, unspecified: Secondary | ICD-10-CM

## 2022-05-28 MED ORDER — PREGABALIN ER 165 MG PO TB24: 1.0000 | ORAL_TABLET | Freq: Every evening | ORAL | 0 refills | Status: DC

## 2022-05-29 ENCOUNTER — Other Ambulatory Visit: Payer: Self-pay | Admitting: Family Medicine

## 2022-05-29 DIAGNOSIS — Z1231 Encounter for screening mammogram for malignant neoplasm of breast: Secondary | ICD-10-CM

## 2022-05-30 ENCOUNTER — Ambulatory Visit (HOSPITAL_COMMUNITY)
Admission: RE | Admit: 2022-05-30 | Discharge: 2022-05-30 | Disposition: A | Discharge status: Home / Self Care | Payer: Medicare Other | Source: Ambulatory Visit | Attending: Family Medicine | Admitting: Family Medicine

## 2022-05-30 DIAGNOSIS — I10 Essential (primary) hypertension: Secondary | ICD-10-CM | POA: Insufficient documentation

## 2022-05-30 DIAGNOSIS — R011 Cardiac murmur, unspecified: Secondary | ICD-10-CM | POA: Diagnosis not present

## 2022-05-30 DIAGNOSIS — I083 Combined rheumatic disorders of mitral, aortic and tricuspid valves: Secondary | ICD-10-CM | POA: Insufficient documentation

## 2022-05-30 DIAGNOSIS — E119 Type 2 diabetes mellitus without complications: Secondary | ICD-10-CM | POA: Insufficient documentation

## 2022-05-30 LAB — ECHOCARDIOGRAM COMPLETE
AR max vel: 1.49 cm2
AV Area VTI: 1.43 cm2
AV Area mean vel: 1.45 cm2
AV Mean grad: 9.3 mmHg
AV Peak grad: 15.8 mmHg
Ao pk vel: 1.99 m/s
Area-P 1/2: 2.78 cm2
Calc EF: 57.8 %
S' Lateral: 2.7 cm
Single Plane A2C EF: 59.7 %
Single Plane A4C EF: 56 %

## 2022-06-16 ENCOUNTER — Other Ambulatory Visit: Payer: Self-pay | Admitting: Family Medicine

## 2022-06-16 DIAGNOSIS — E039 Hypothyroidism, unspecified: Secondary | ICD-10-CM

## 2022-06-17 DIAGNOSIS — M5387 Other specified dorsopathies, lumbosacral region: Secondary | ICD-10-CM | POA: Diagnosis not present

## 2022-06-17 DIAGNOSIS — M5383 Other specified dorsopathies, cervicothoracic region: Secondary | ICD-10-CM | POA: Diagnosis not present

## 2022-06-17 DIAGNOSIS — M9902 Segmental and somatic dysfunction of thoracic region: Secondary | ICD-10-CM | POA: Diagnosis not present

## 2022-06-17 DIAGNOSIS — M9904 Segmental and somatic dysfunction of sacral region: Secondary | ICD-10-CM | POA: Diagnosis not present

## 2022-06-17 DIAGNOSIS — M9903 Segmental and somatic dysfunction of lumbar region: Secondary | ICD-10-CM | POA: Diagnosis not present

## 2022-06-17 DIAGNOSIS — M5136 Other intervertebral disc degeneration, lumbar region: Secondary | ICD-10-CM | POA: Diagnosis not present

## 2022-06-17 DIAGNOSIS — M9901 Segmental and somatic dysfunction of cervical region: Secondary | ICD-10-CM | POA: Diagnosis not present

## 2022-06-18 ENCOUNTER — Ambulatory Visit: Payer: Medicare Other | Admitting: Physician Assistant

## 2022-06-19 ENCOUNTER — Other Ambulatory Visit: Payer: Self-pay | Admitting: Family Medicine

## 2022-06-19 DIAGNOSIS — K219 Gastro-esophageal reflux disease without esophagitis: Secondary | ICD-10-CM

## 2022-06-20 ENCOUNTER — Ambulatory Visit: Payer: Self-pay

## 2022-06-20 ENCOUNTER — Telehealth: Payer: Self-pay

## 2022-06-20 DIAGNOSIS — E1042 Type 1 diabetes mellitus with diabetic polyneuropathy: Secondary | ICD-10-CM

## 2022-06-20 DIAGNOSIS — I10 Essential (primary) hypertension: Secondary | ICD-10-CM

## 2022-06-20 NOTE — Chronic Care Management (AMB) (Signed)
Chronic Care Management   CCM RN Visit Note  06/20/2022 Name: [name] MRN: [id number] DOB: 19-Mar-1951  Subjective: Karla Patton is a 71 y.o. year old female previously assigned to Karla Patton, [place]. The care management team was consulted for assistance with disease management and care coordination needs.    Engaged with patient by telephone for follow up visit in response to provider referral for case management and care coordination services.   Consent to Services:  The patient was given information about Chronic Care Management services, agreed to services, and gave verbal consent prior to initiation of services.  Please see initial visit note for detailed documentation.   Assessment: Review of patient past medical history, allergies, medications, health status, including review of consultants reports, laboratory and other test data, was performed as part of comprehensive evaluation and provision of chronic care management services.   SDOH (Social Determinants of Health) assessments and interventions performed: No  CCM Care Plan  Allergies  Allergen Reactions   Lisinopril Cough    Outpatient Encounter Medications as of 06/20/2022  Medication Sig Note   acetaminophen (TYLENOL) 650 MG CR tablet Take 650 mg by mouth every 8 (eight) hours as needed for pain.    APPLE CIDER VINEGAR PO Take 600 mg by mouth daily.     aspirin EC 81 MG tablet Take 81 mg by mouth daily. Takes at bedtime    beta carotene 25000 UNIT capsule Take 25,000 Units by mouth daily.    Cholecalciferol (VITAMIN D3) 250 MCG (10000 UT) capsule Take 10,000 Units by mouth daily.    Continuous Blood Gluc Sensor (DEXCOM G6 SENSOR) MISC UsE TO CHECK BLOOD SUGAR THROUGHOUT THE DAY REPLACE EVERY 14 DAYS    CONTOUR NEXT TEST test strip CHECK BLOOD SUGAR BEFORE MEALS AND AT BEDTIME    Ferrous Sulfate Dried (HIGH POTENCY IRON) 65 MG TABS Take 1 tablet by mouth daily.    fluticasone (FLONASE) 50 MCG/ACT nasal  spray INSTILL 2 SPRAYS INTO EACH NOSTRIL DAILY    furosemide (LASIX) 20 MG tablet TAKE 1 TABLET BY MOUTH DAILY    insulin aspart (NOVOLOG FLEXPEN) 100 UNIT/ML FlexPen Max daily 30 units    insulin glargine (LANTUS SOLOSTAR) 100 UNIT/ML Solostar Pen Inject 16 Units into the skin daily with breakfast.    Insulin Pen Needle (BD PEN NEEDLE MICRO U/F) 32G X 6 MM MISC 1 Device by Does not apply route in the morning, at noon, in the evening, and at bedtime.    levothyroxine (SYNTHROID) 100 MCG tablet Take 1 tablet (100 mcg total) by mouth daily before breakfast.    MAGNESIUM-ZINC PO Take 1 tablet by mouth daily.    Multiple Vitamins-Minerals (ABC PLUS SENIOR ADULTS 50+ PO) Take 1 tablet by mouth daily. 02/26/2021: Centrum Silver    omeprazole (PRILOSEC) 40 MG capsule TAKE 1 CAPSULE(40 MG) BY MOUTH DAILY    potassium chloride SA (KLOR-CON M) 20 MEQ tablet TAKE 1 TABLET BY MOUTH EVERY DAY WITH FUROSEMIDE AS NEEDED AS DIRECTED    Pregabalin ER (LYRICA CR) 165 MG TB24 Take 1 tablet by mouth at bedtime.    rosuvastatin (CRESTOR) 10 MG tablet Take 1 tablet (10 mg total) by mouth daily.    No facility-administered encounter medications on file as of 06/20/2022.    Patient Active Problem List   Diagnosis Date Noted   Murmur 05/27/2022   Gastroesophageal reflux disease without esophagitis 05/27/2022   Spinal stenosis of lumbar region 05/22/2022   Lumbar foraminal stenosis 05/22/2022  Arthropathy of lumbar facet joint 05/22/2022   Lumbar spondylosis 05/22/2022   Type 1 diabetes mellitus with hyperglycemia ([place]) 02/27/2022   Type 1 diabetes mellitus with mild nonproliferative retinopathy ([place]) 02/27/2022   Type 1 diabetes mellitus with diabetic polyneuropathy ([place]) 02/27/2022   Arthritis of right knee 12/06/2021   Varicose veins of lower extremity with edema, bilateral 08/23/2021   Venous insufficiency of both lower extremities 08/23/2021   Hyperlipidemia due to type 1 diabetes mellitus ([place]) 06/13/2021    Obesity 06/13/2021   Spondylolisthesis of lumbar region 03/19/2021   Spondylolisthesis, lumbar region 03/18/2021   Lymphedema 10/30/2019   Essential hypertension 03/03/2018   Headache disorder 02/10/2018   Osteopenia 01/13/2018   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 ([place]) 08/28/2017   Hepatic cyst 08/28/2017   Hearing loss of right ear 08/28/2017   Background diabetic retinopathy ([place]) 05/22/2015   Hypothyroidism 10/10/2014   Diabetic hypoglycemia ([place]) 10/10/2014   Type 2 diabetes mellitus with hyperglycemia ([place]) 10/10/2014   Varicose veins with pain 11/11/2013    Thorough conversation regarding plan for management of hypertension, diabetes and fall risk. Reports compliance with medications and treatment plans. Anticipates discussing plan to restart use of an insulin pump with her new Endocrinologist/Dr. Kelton Pillar. Reports transitioning to a new primary care Journalist, newspaper at [facility]. Confirmed completion of initial evaluation in June. Agreed to notify the clinic if she requires additional care management outreach. A care team member will gladly assist.    Horris Latino [facility] [place] [facility] Care Management [phone]

## 2022-06-20 NOTE — Patient Instructions (Signed)
Thank you for allowing the Chronic Care Management team to participate in your care. It was great speaking with you today! Please don't hesitate to reach out if you require additional care management services.   [facility] Care Management [phone]

## 2022-06-20 NOTE — Telephone Encounter (Signed)
  Care Management   Follow Up Note   06/20/2022 Name: Karla Patton MRN: [id number] DOB: 07/09/1951   Primary Care Provider: Lesleigh Noe, MD Reason for referral : Chronic Care Management   An unsuccessful telephone outreach was attempted today. The patient was referred to the case management team for assistance with care management and care coordination.   Follow Up Plan:  A HIPAA compliant voice message was left today requesting a return call.   [facility] Care Management [phone]

## 2022-06-30 ENCOUNTER — Encounter: Payer: Self-pay | Admitting: Internal Medicine

## 2022-06-30 ENCOUNTER — Other Ambulatory Visit: Payer: Self-pay

## 2022-06-30 ENCOUNTER — Ambulatory Visit (INDEPENDENT_AMBULATORY_CARE_PROVIDER_SITE_OTHER): Payer: Medicare Other | Admitting: Internal Medicine

## 2022-06-30 VITALS — BP 140/80 | HR 70 | Ht 63.0 in | Wt 182.0 lb

## 2022-06-30 DIAGNOSIS — E1065 Type 1 diabetes mellitus with hyperglycemia: Secondary | ICD-10-CM

## 2022-06-30 DIAGNOSIS — E039 Hypothyroidism, unspecified: Secondary | ICD-10-CM

## 2022-06-30 DIAGNOSIS — E1042 Type 1 diabetes mellitus with diabetic polyneuropathy: Secondary | ICD-10-CM

## 2022-06-30 DIAGNOSIS — E103299 Type 1 diabetes mellitus with mild nonproliferative diabetic retinopathy without macular edema, unspecified eye: Secondary | ICD-10-CM | POA: Diagnosis not present

## 2022-06-30 LAB — POCT GLYCOSYLATED HEMOGLOBIN (HGB A1C): Hemoglobin A1C: 7.7 % — AB (ref 4.0–5.6)

## 2022-06-30 MED ORDER — OMNIPOD 5 DEXG7G6 INTRO GEN 5 KIT: 1.0000 | PACK | 0 refills | Status: DC

## 2022-06-30 MED ORDER — OMNIPOD 5 DEXG7G6 PODS GEN 5 MISC: 1.0000 | 3 refills | Status: DC

## 2022-06-30 NOTE — Progress Notes (Signed)
Name: Karla Patton  MRN/ DOB: [id number], 1951-06-16   Age/ Sex: 71 y.o., female    PCP: Lesleigh Noe, MD   Reason for Endocrinology Evaluation: Type 1 Diabetes Mellitus     Date of Initial Endocrinology Visit: 02/27/2022    PATIENT IDENTIFIER: [name] is a 71 y.o. female with a past medical history of T1DM, Hypothyroidism, Dyslipidemia . The patient presented for initial endocrinology clinic visit on 02/27/2022 for consultative assistance with her diabetes management.    HPI: [name] was    Diagnosed with DM at age 89  Prior Medications tried/Intolerance: She was on Medtr[employer] but did not like the tubing   Hemoglobin A1c has ranged from 6.8% in 2022, peaking at 8.9% in 2019. Patient required assistance for hypoglycemia: yes x2 over the years    On her initial visit to our clinic, her A1c is 7.5% she was on Lantus, and NovoLog which we adjusted  SUBJECTIVE:   During the last visit (02/27/2022): A1c 7.5%, adjusted MDI  Today (06/30/22): [name] is here for follow-up on diabetes management.  She checks her blood sugars multiple  times daily, through CGM .The patient has  had hypoglycemic episodes since the last clinic visit     Has constipation but no nausea, uses Senna    HOME DIABETES REGIMEN: Lantus 16 units daily  Novolog I:C ratio of 1:13   Correction factor : Novolog (BG-130/50)     Statin: yes ACE-I/ARB: yes     CONTINUOUS GLUCOSE MONITORING RECORD INTERPRETATION    Dates of Recording: 6/27-7/09/2022  Sensor description: dexcom  Results statistics:   CGM use % of time 93  Average and SD 178/68  Time in range    58%  % Time Above 180 29  % Time above 250 12  % Time Below target < 1       Glycemic patterns summary:  hyperglycemia continues to be during the day and Bg's optimal at night   Hyperglycemic episodes  postprandial  Hypoglycemic episodes occurred post prandial   Overnight periods: stable        DIABETIC  COMPLICATIONS: Microvascular complications:  Non proliferative DR, neuropathy  Denies: CKD  Last eye exam: Completed 01/03/2022  Macrovascular complications:   Denies: CAD, PVD, CVA   PAST HISTORY: Past Medical History:  Past Medical History:  Diagnosis Date   Allergy 11/2021   Lisinolpril-cough   Anemia    Anxiety    Arthritis    Back pain    Diabetes mellitus without complication (HCC)    type 1   GERD (gastroesophageal reflux disease)    Heart murmur    Hypothyroidism    Thyroid disease    Varicose vein    Past Surgical History:  Past Surgical History:  Procedure Laterality Date   ABDOMINAL HYSTERECTOMY  1994   CHOLECYSTECTOMY  09/07/2013   COLONOSCOPY  2009   normal   COLONOSCOPY WITH PROPOFOL N/A 10/20/2018   Procedure: COLONOSCOPY WITH PROPOFOL;  Surgeon: Robert Bellow, MD;  Location: [facility] ENDOSCOPY;  Service: Endoscopy;  Laterality: N/A;   INCONTINENCE SURGERY  2003   left leg vein ligation  2015   SPINE SURGERY  03-18-2021   VARICOSE VEIN SURGERY  1990's   Dr Bary Castilla   VEIN SURGERY Left 11/17/2017   vein closure procedure/Dr Jamal Collin    Social History:  reports that she has never smoked. She has never used smokeless tobacco. She reports current alcohol use of about 9.0 standard drinks of  alcohol per week. She reports that she does not use drugs. Family History:  Family History  Problem Relation Age of Onset   Diabetes Mother    Pancreatitis Mother    Heart disease Father    Stroke Father        x 2   Hypertension Father    Varicose Veins Father    Fibromyalgia Sister    Scoliosis Sister    Arthritis Sister    Diabetes Sister    Breast cancer Neg Hx      HOME MEDICATIONS: Allergies as of 06/30/2022       Reactions   Lisinopril Cough        Medication List        Accurate as of June 30, 2022  9:53 AM. If you have any questions, ask your nurse or doctor.          ABC PLUS SENIOR ADULTS 50+ PO Take 1 tablet by mouth daily.    acetaminophen 650 MG CR tablet Commonly known as: TYLENOL Take 650 mg by mouth every 8 (eight) hours as needed for pain.   APPLE CIDER VINEGAR PO Take 600 mg by mouth daily.   aspirin EC 81 MG tablet Take 81 mg by mouth daily. Takes at bedtime   BD Pen Needle Micro U/F 32G X 6 MM Misc Generic drug: Insulin Pen Needle 1 Device by Does not apply route in the morning, at noon, in the evening, and at bedtime.   beta carotene 25000 UNIT capsule Take 25,000 Units by mouth daily.   Contour Next Test test strip Generic drug: glucose blood CHECK BLOOD SUGAR BEFORE MEALS AND AT BEDTIME   Dexcom G6 Sensor Misc UsE TO CHECK BLOOD SUGAR THROUGHOUT THE DAY REPLACE EVERY 14 DAYS   fluticasone 50 MCG/ACT nasal spray Commonly known as: FLONASE INSTILL 2 SPRAYS INTO EACH NOSTRIL DAILY   furosemide 20 MG tablet Commonly known as: LASIX TAKE 1 TABLET BY MOUTH DAILY   High Potency Iron 65 MG Tabs Take 1 tablet by mouth daily.   Lantus SoloStar 100 UNIT/ML Solostar Pen Generic drug: insulin glargine Inject 16 Units into the skin daily with breakfast.   levothyroxine 100 MCG tablet Commonly known as: SYNTHROID Take 1 tablet (100 mcg total) by mouth daily before breakfast.   MAGNESIUM-ZINC PO Take 1 tablet by mouth daily.   NovoLOG FlexPen 100 UNIT/ML FlexPen Generic drug: insulin aspart Max daily 30 units   omeprazole 40 MG capsule Commonly known as: PRILOSEC TAKE 1 CAPSULE(40 MG) BY MOUTH DAILY   potassium chloride SA 20 MEQ tablet Commonly known as: KLOR-CON M TAKE 1 TABLET BY MOUTH EVERY DAY WITH FUROSEMIDE AS NEEDED AS DIRECTED   pregabalin 225 MG capsule Commonly known as: LYRICA Take 225 mg by mouth daily.   Pregabalin ER 165 MG Tb24 Commonly known as: Lyrica CR Take 1 tablet by mouth at bedtime.   rosuvastatin 10 MG tablet Commonly known as: CRESTOR Take 1 tablet (10 mg total) by mouth daily.   Vitamin D3 250 MCG (10000 UT) capsule Take 10,000 Units by mouth  daily.         ALLERGIES: Allergies  Allergen Reactions   Lisinopril Cough         OBJECTIVE:   VITAL SIGNS: BP 140/80 (BP Location: Left Arm, Patient Position: Sitting, Cuff Size: Large)   Pulse 70   Ht '5\' 3"'$  (1.6 m)   Wt 182 lb (82.6 kg)   SpO2 98%   BMI 32.24  kg/m      PHYSICAL EXAM:  General: Pt appears well and is in NAD  Neck: General: Supple without adenopathy or carotid bruits. Thyroid: Thyroid size normal.  No goiter or nodules appreciated.  Lungs: Clear with good BS bilat with no rales, rhonchi, or wheezes  Heart: RRR with normal S1 and S2 and no gallops; no murmurs; no rub  Abdomen: Normoactive bowel sounds, soft, nontender, without masses or organomegaly palpable  Extremities:  Lower extremities - No pretibial edema. No lesions.  Neuro: MS is good with appropriate affect, pt is alert and Ox3    DM foot exam: 02/27/2022  The skin of the feet is without sores or ulcerations, right foot deformity  The pedal pulses are 2+ on right and 2+ on left. The sensation is decreased  to a screening 5.07, 10 gram monofilament on the right     DATA REVIEWED:  Lab Results  Component Value Date   HGBA1C 7.7 (A) 06/30/2022   HGBA1C 7.5 (H) 01/07/2022   HGBA1C 7.6 (A) 06/13/2021   Lab Results  Component Value Date   MICROALBUR negative 07/23/2020   LDLCALC 108 (H) 01/07/2022   CREATININE 0.69 01/27/2022   Lab Results  Component Value Date   MICRALBCREAT <30 01/13/2022    Lab Results  Component Value Date   CHOL 200 (H) 01/07/2022   HDL 78 01/07/2022   LDLCALC 108 (H) 01/07/2022   TRIG 77 01/07/2022   CHOLHDL 3.4 01/20/2020        Latest Reference Range & Units 05/01/15 14:41  Glutamic Acid Decarb Ab < IU/mL >250 (H)   ASSESSMENT / PLAN / RECOMMENDATIONS:   1) Type 1 Diabetes Mellitus, Sub-OPtimally controlled, With neuropathic and retinopathic complications - Most recent A1c of 7.7 %. Goal A1c < 7.0 %.    -A1c remains above goal -In review of  CGM download the patient has been noted with severe hyperglycemia in the middle of the day, this is due to insulin to carb mismatch, will adjust NovoLog as below - Pt would like to try the Omnipod , prescroption will be faxed to [facility]  - A referral has been for OmniPod training - Encouraged to bolus right before the meal and not after - Encouraged the use of Correction scale provided , as she continues to use her own -We again discussed the eating a protein bar before exercise if her BG <150 mg/dL    MEDICATIONS: Continue Lantus 16 units daily Change NovoLog 1: 13 grams of CHO for Breakfast , 1:11 for lunch and 1:13 for supper  Correction factor : NovoLog  (BG -130/50)   EDUCATION / INSTRUCTIONS: BG monitoring instructions: Patient is instructed to check her blood sugars 3 times a day, before meals. Call [facility] [facility] if: BG persistently < 70 I reviewed the Rule of 15 for the treatment of hypoglycemia in detail with the patient. Literature supplied.   2) Diabetic complications:  Eye: Does  have known diabetic retinopathy.  Neuro/ Feet: Does  have known diabetic peripheral neuropathy. Renal: Patient does not have known baseline CKD. She is  on an ACEI/ARB at present.      3) Hypothyroidism:   -TSH has been within range  Medication Levothyroxine 100 mcg daily   F/U in 4 months    Signed electronically by: Mack Guise, MD  [facility]  [address] [place], [place] [address] Phone: [phone] FAX: [phone]   CC: Lesleigh Noe, MD  [place] [address] Phone: [phone]  Fax: [phone]    Return to Endocrinology clinic as below: Future Appointments  Date Time Provider [place]  07/11/2022 10:15 AM Kris Hartmann, NP [facility] None  07/23/2022  2:00 PM [facility] MM GV-1 [facility] [place]  07/23/2022  2:20 PM [place] MM GV-DEXA [facility] [place]  08/12/2022  1:00 PM  Kris Hartmann, NP [facility] None  08/27/2022 11:00 AM Lesleigh Noe, MD [facility]  09/09/2022  1:00 PM Kris Hartmann, NP [facility] None

## 2022-06-30 NOTE — Patient Instructions (Addendum)
-   Continue Lantus 16 units daily  - Novolog take one unit for every 13 grams of carbohydrates for Breakfast , 1:11 for Lunch and 1:13 for supper  -Novolog correctional insulin: ADD extra units on insulin to your meal-time Novolog dose if your blood sugars are higher than 180. Use the scale below to help guide you:   Blood sugar before meal Number of units to inject  Less than 180 0 unit  181 -  230 1 units  231 -  280 2 units  281 -  330 3 units  331 -  380 4 units  381 -  430 5 units     Check sugar before you take a walk , if sugar is less than 150 , eat a protein bar before you start the walk   HOW TO TREAT LOW BLOOD SUGARS (Blood sugar LESS THAN 70 MG/DL) Please follow the RULE OF 15 for the treatment of hypoglycemia treatment (when your (blood sugars are less than 70 mg/dL)   STEP 1: Take 15 grams of carbohydrates when your blood sugar is low, which includes:  3-4 GLUCOSE TABS  OR 3-4 OZ OF JUICE OR REGULAR SODA OR ONE TUBE OF GLUCOSE GEL    STEP 2: RECHECK blood sugar in 15 MINUTES STEP 3: If your blood sugar is still low at the 15 minute recheck --> then, go back to STEP 1 and treat AGAIN with another 15 grams of carbohydrates.

## 2022-07-01 DIAGNOSIS — Z6830 Body mass index (BMI) 30.0-30.9, adult: Secondary | ICD-10-CM | POA: Diagnosis not present

## 2022-07-01 DIAGNOSIS — Z981 Arthrodesis status: Secondary | ICD-10-CM | POA: Diagnosis not present

## 2022-07-01 DIAGNOSIS — M4316 Spondylolisthesis, lumbar region: Secondary | ICD-10-CM | POA: Diagnosis not present

## 2022-07-07 ENCOUNTER — Telehealth (INDEPENDENT_AMBULATORY_CARE_PROVIDER_SITE_OTHER): Payer: Self-pay

## 2022-07-07 DIAGNOSIS — E119 Type 2 diabetes mellitus without complications: Secondary | ICD-10-CM | POA: Diagnosis not present

## 2022-07-07 DIAGNOSIS — M13862 Other specified arthritis, left knee: Secondary | ICD-10-CM | POA: Diagnosis not present

## 2022-07-07 DIAGNOSIS — M25562 Pain in left knee: Secondary | ICD-10-CM | POA: Diagnosis not present

## 2022-07-07 NOTE — Telephone Encounter (Signed)
No it should not

## 2022-07-08 NOTE — Telephone Encounter (Signed)
Patient was made aware with medical recommendations  

## 2022-07-11 ENCOUNTER — Ambulatory Visit (INDEPENDENT_AMBULATORY_CARE_PROVIDER_SITE_OTHER): Payer: Medicare Other | Admitting: Nurse Practitioner

## 2022-07-11 ENCOUNTER — Encounter (INDEPENDENT_AMBULATORY_CARE_PROVIDER_SITE_OTHER): Payer: Self-pay | Admitting: Nurse Practitioner

## 2022-07-11 VITALS — BP 150/66 | HR 85 | Resp 17 | Ht 64.0 in | Wt 184.0 lb

## 2022-07-11 DIAGNOSIS — I872 Venous insufficiency (chronic) (peripheral): Secondary | ICD-10-CM

## 2022-07-11 DIAGNOSIS — I83819 Varicose veins of unspecified lower extremities with pain: Secondary | ICD-10-CM | POA: Diagnosis not present

## 2022-07-15 DIAGNOSIS — M9901 Segmental and somatic dysfunction of cervical region: Secondary | ICD-10-CM | POA: Diagnosis not present

## 2022-07-15 DIAGNOSIS — M9903 Segmental and somatic dysfunction of lumbar region: Secondary | ICD-10-CM | POA: Diagnosis not present

## 2022-07-15 DIAGNOSIS — M5383 Other specified dorsopathies, cervicothoracic region: Secondary | ICD-10-CM | POA: Diagnosis not present

## 2022-07-15 DIAGNOSIS — M5136 Other intervertebral disc degeneration, lumbar region: Secondary | ICD-10-CM | POA: Diagnosis not present

## 2022-07-15 DIAGNOSIS — M5387 Other specified dorsopathies, lumbosacral region: Secondary | ICD-10-CM | POA: Diagnosis not present

## 2022-07-15 DIAGNOSIS — M9904 Segmental and somatic dysfunction of sacral region: Secondary | ICD-10-CM | POA: Diagnosis not present

## 2022-07-15 DIAGNOSIS — M9902 Segmental and somatic dysfunction of thoracic region: Secondary | ICD-10-CM | POA: Diagnosis not present

## 2022-07-22 ENCOUNTER — Encounter: Payer: Self-pay | Admitting: Family Medicine

## 2022-07-22 ENCOUNTER — Other Ambulatory Visit: Payer: Self-pay

## 2022-07-22 ENCOUNTER — Encounter: Payer: Self-pay | Admitting: Internal Medicine

## 2022-07-22 DIAGNOSIS — E1065 Type 1 diabetes mellitus with hyperglycemia: Secondary | ICD-10-CM

## 2022-07-22 MED ORDER — OMNIPOD 5 DEXG7G6 INTRO GEN 5 KIT: 1.0000 | PACK | 0 refills | Status: DC

## 2022-07-22 MED ORDER — OMNIPOD 5 DEXG7G6 PODS GEN 5 MISC: 1.0000 | 3 refills | Status: DC

## 2022-07-23 ENCOUNTER — Ambulatory Visit
Admission: RE | Admit: 2022-07-23 | Discharge: 2022-07-23 | Disposition: A | Discharge status: Home / Self Care | Payer: Medicare Other | Source: Ambulatory Visit | Attending: Family Medicine | Admitting: Family Medicine

## 2022-07-23 DIAGNOSIS — E2839 Other primary ovarian failure: Secondary | ICD-10-CM | POA: Insufficient documentation

## 2022-07-23 DIAGNOSIS — M810 Age-related osteoporosis without current pathological fracture: Secondary | ICD-10-CM | POA: Diagnosis not present

## 2022-07-23 DIAGNOSIS — M8580 Other specified disorders of bone density and structure, unspecified site: Secondary | ICD-10-CM | POA: Diagnosis not present

## 2022-07-23 DIAGNOSIS — Z1231 Encounter for screening mammogram for malignant neoplasm of breast: Secondary | ICD-10-CM

## 2022-07-23 DIAGNOSIS — M85832 Other specified disorders of bone density and structure, left forearm: Secondary | ICD-10-CM | POA: Diagnosis not present

## 2022-07-28 ENCOUNTER — Encounter (INDEPENDENT_AMBULATORY_CARE_PROVIDER_SITE_OTHER): Payer: Self-pay | Admitting: Nurse Practitioner

## 2022-07-28 NOTE — Progress Notes (Signed)
Varicose veins of left lower extremity with inflammation (454.1  I83.10) Current Plans   Indication: Patient presents with symptomatic varicose veins of the left lower extremity.   Procedure: Sclerotherapy using hypertonic saline mixed with 1% Lidocaine was performed on the left lower extremity. Compression wraps were placed. The patient tolerated the procedure well. 

## 2022-07-29 ENCOUNTER — Telehealth: Payer: Self-pay | Admitting: Pharmacy Technician

## 2022-07-29 ENCOUNTER — Other Ambulatory Visit (HOSPITAL_COMMUNITY): Payer: Self-pay

## 2022-07-29 NOTE — Telephone Encounter (Signed)
Patient Advocate Encounter   Received notification from [facility] that prior authorization for Omnipod 5 G6 (kit & pods) is required/requested.   PA submitted on 07/29/22 to [facility] via [place] (kit); [id number] (pods)   Kit: Prior Authorization has been approved.    Effective dates: 12/22/21 through 12/21/22 PA case ID: [id number]  Per Test Claim Patients co-pay is $146.53.   Pods: Prior Authorization has been approved.    PA# PA case ID: [id number]  Effective dates: 12/22/21 through 12/21/22  Per Test Claim Patients co-pay is $146.53.

## 2022-08-12 ENCOUNTER — Encounter (INDEPENDENT_AMBULATORY_CARE_PROVIDER_SITE_OTHER): Payer: Self-pay | Admitting: Nurse Practitioner

## 2022-08-12 ENCOUNTER — Ambulatory Visit (INDEPENDENT_AMBULATORY_CARE_PROVIDER_SITE_OTHER): Payer: Medicare Other | Admitting: Nurse Practitioner

## 2022-08-12 VITALS — BP 125/71 | HR 88 | Resp 16 | Wt 181.0 lb

## 2022-08-12 DIAGNOSIS — I8312 Varicose veins of left lower extremity with inflammation: Secondary | ICD-10-CM

## 2022-08-12 DIAGNOSIS — M9902 Segmental and somatic dysfunction of thoracic region: Secondary | ICD-10-CM | POA: Diagnosis not present

## 2022-08-12 DIAGNOSIS — M5136 Other intervertebral disc degeneration, lumbar region: Secondary | ICD-10-CM | POA: Diagnosis not present

## 2022-08-12 DIAGNOSIS — M9904 Segmental and somatic dysfunction of sacral region: Secondary | ICD-10-CM | POA: Diagnosis not present

## 2022-08-12 DIAGNOSIS — M9903 Segmental and somatic dysfunction of lumbar region: Secondary | ICD-10-CM | POA: Diagnosis not present

## 2022-08-12 DIAGNOSIS — M5383 Other specified dorsopathies, cervicothoracic region: Secondary | ICD-10-CM | POA: Diagnosis not present

## 2022-08-12 DIAGNOSIS — M5387 Other specified dorsopathies, lumbosacral region: Secondary | ICD-10-CM | POA: Diagnosis not present

## 2022-08-12 DIAGNOSIS — I83819 Varicose veins of unspecified lower extremities with pain: Secondary | ICD-10-CM

## 2022-08-12 DIAGNOSIS — M9901 Segmental and somatic dysfunction of cervical region: Secondary | ICD-10-CM | POA: Diagnosis not present

## 2022-08-18 DIAGNOSIS — M170 Bilateral primary osteoarthritis of knee: Secondary | ICD-10-CM | POA: Diagnosis not present

## 2022-08-22 ENCOUNTER — Other Ambulatory Visit: Payer: Self-pay | Admitting: Internal Medicine

## 2022-08-22 ENCOUNTER — Telehealth: Payer: Self-pay

## 2022-08-22 MED ORDER — INSULIN ASPART 100 UNIT/ML IJ SOLN: INTRAMUSCULAR | 11 refills | Status: DC

## 2022-08-22 NOTE — Telephone Encounter (Signed)
Patient needs insulin vials sent in for her pump training.

## 2022-08-26 ENCOUNTER — Ambulatory Visit: Payer: Medicare Other | Admitting: Nutrition

## 2022-08-27 ENCOUNTER — Ambulatory Visit: Payer: Medicare Other | Admitting: Family Medicine

## 2022-08-27 ENCOUNTER — Encounter: Payer: Medicare Other | Attending: Internal Medicine | Admitting: Nutrition

## 2022-08-27 DIAGNOSIS — E103293 Type 1 diabetes mellitus with mild nonproliferative diabetic retinopathy without macular edema, bilateral: Secondary | ICD-10-CM | POA: Insufficient documentation

## 2022-08-28 ENCOUNTER — Ambulatory Visit (INDEPENDENT_AMBULATORY_CARE_PROVIDER_SITE_OTHER): Payer: Medicare Other | Admitting: Family Medicine

## 2022-08-28 ENCOUNTER — Telehealth: Payer: Self-pay | Admitting: Nutrition

## 2022-08-28 VITALS — BP 110/74 | HR 69 | Temp 97.7°F | Ht 65.0 in | Wt 182.1 lb

## 2022-08-28 DIAGNOSIS — E1042 Type 1 diabetes mellitus with diabetic polyneuropathy: Secondary | ICD-10-CM | POA: Diagnosis not present

## 2022-08-28 DIAGNOSIS — L299 Pruritus, unspecified: Secondary | ICD-10-CM | POA: Diagnosis not present

## 2022-08-28 DIAGNOSIS — E785 Hyperlipidemia, unspecified: Secondary | ICD-10-CM | POA: Diagnosis not present

## 2022-08-28 DIAGNOSIS — E1069 Type 1 diabetes mellitus with other specified complication: Secondary | ICD-10-CM

## 2022-08-28 DIAGNOSIS — R6889 Other general symptoms and signs: Secondary | ICD-10-CM

## 2022-08-28 DIAGNOSIS — J309 Allergic rhinitis, unspecified: Secondary | ICD-10-CM | POA: Diagnosis not present

## 2022-08-28 DIAGNOSIS — Z23 Encounter for immunization: Secondary | ICD-10-CM | POA: Diagnosis not present

## 2022-08-28 NOTE — Telephone Encounter (Signed)
Patient reported no difficulty giving her boluses last night for supper and again this morning.  Says FBS today at 6AM was 145.  But before eating at 8 AM blood sugar was 210.  She was told that this pump needs 6 days before it will help make adjustments to her blood sugar readings, but that it will stop the flow if insulin if the blood sugars drop low.  She good understanding of this and had no questions for me at this time.

## 2022-08-28 NOTE — Patient Instructions (Addendum)
Itching - Continue the eye drops for dye eyes - Start Zyrtec daily for 2 weeks if no improvement - Ok for benadryl as need - restart flonase   Try Cerave brand Lotion   Labs fasting in 1 week

## 2022-08-28 NOTE — Patient Instructions (Signed)
REad over manual and starter booklet Call OmniPod if questions on how to use the pump Call office if blood sugars drop low or remain over 225.

## 2022-08-28 NOTE — Assessment & Plan Note (Signed)
Lab Results  Component Value Date   HGBA1C 7.7 (A) 06/30/2022   Following with endocrinology. Recently started on insulin pump. Appreciate support. Continue diabetic diet.

## 2022-08-28 NOTE — Assessment & Plan Note (Signed)
Return for fasting labs. Cont crestor 10 mg

## 2022-08-28 NOTE — Progress Notes (Signed)
Subjective:     Karla Patton is a 71 y.o. female presenting for Follow-up     HPI  Itching - face, eyes - eyes were red - and did drops - head/face/eyes - no watery eyes - no redness apart from rubbing - cough x 1 year - which is getting better - did have some sinus drainage and had a little clear sputum but this has resolved - post nasal drip - treatment: benadryl - which helps but causes sedation - no smoke exposure - 4-5 months of symptoms -   Does use flonase but not recently   Diabetes - just recently got a pump - seeing endocrine -  Lab Results  Component Value Date   HGBA1C 7.7 (A) 06/30/2022     Review of Systems   Social History   Tobacco Use  Smoking Status Never  Smokeless Tobacco Never        Objective:    BP Readings from Last 3 Encounters:  08/28/22 110/74  08/12/22 125/71  07/11/22 (!) 150/66   Wt Readings from Last 3 Encounters:  08/28/22 182 lb 2 oz (82.6 kg)  08/12/22 181 lb (82.1 kg)  07/11/22 184 lb (83.5 kg)    BP 110/74   Pulse 69   Temp 97.7 F (36.5 C) (Temporal)   Ht '5\' 5"'$  (1.651 m)   Wt 182 lb 2 oz (82.6 kg)   SpO2 96%   BMI 30.31 kg/m    Physical Exam Constitutional:      General: She is not in acute distress.    Appearance: She is well-developed. She is not diaphoretic.  HENT:     Head: Normocephalic and atraumatic.     Right Ear: Tympanic membrane and external ear normal.     Left Ear: Tympanic membrane and external ear normal.     Nose: Nose normal.     Mouth/Throat:     Mouth: Mucous membranes are moist.  Eyes:     Extraocular Movements: Extraocular movements intact.     Conjunctiva/sclera: Conjunctivae normal.     Pupils: Pupils are equal, round, and reactive to light.     Comments: Some skin redness around the eye  Cardiovascular:     Rate and Rhythm: Normal rate.     Heart sounds: Murmur heard.  Pulmonary:     Effort: Pulmonary effort is normal.  Musculoskeletal:     Cervical  back: Neck supple.  Skin:    General: Skin is warm and dry.     Capillary Refill: Capillary refill takes less than 2 seconds.     Comments: No erythema. No scalp lesions or rashes.  Neurological:     Mental Status: She is alert. Mental status is at baseline.  Psychiatric:        Mood and Affect: Mood normal.        Behavior: Behavior normal.           Assessment & Plan:   Problem List Items Addressed This Visit       Respiratory   Allergic rhinitis - Primary    Suspect itching is related to allergies. Trial of zyrtec, flonase. Cont prn benadry.         Endocrine   Hyperlipidemia due to type 1 diabetes mellitus ([place])    Return for fasting labs. Cont crestor 10 mg      Relevant Orders   Lipid panel   Type 1 diabetes mellitus with diabetic polyneuropathy (HCC)    Lab Results  Component Value Date   HGBA1C 7.7 (A) 06/30/2022  Following with endocrinology. Recently started on insulin pump. Appreciate support. Continue diabetic diet.         Other   Pruritus    No skin changes will evaluate liver/kidney function. Trial of cerave and allergy meds. Cont prn benadryl.       Relevant Orders   Comprehensive metabolic panel   Other Visit Diagnoses     Need for influenza vaccination       Relevant Orders   Flu Vaccine QUAD High Dose(Fluad)   Itchy eyes            Return in about 4 months (around 12/28/2022) for TOC with Dr. Diona Browner or Silvio Pate.  Lesleigh Noe, MD

## 2022-08-28 NOTE — Assessment & Plan Note (Signed)
Suspect itching is related to allergies. Trial of zyrtec, flonase. Cont prn benadry.

## 2022-08-28 NOTE — Assessment & Plan Note (Signed)
No skin changes will evaluate liver/kidney function. Trial of cerave and allergy meds. Cont prn benadryl.

## 2022-08-28 NOTE — Progress Notes (Signed)
Patient is here with her husband to learn how to use the OmniPod 5 insulin pump.  She is currently wearing a dexcom CGM.  We discussed how this pump will deliver insulin, using the Dexcom to help her regulate her blood sugar readings, and she reported good understanding of this. She filled a pod with Novolog insulin and linked the pod to the Dexcom and to [employer] through [employer].  Settings were put in per Dr. Quin Hoop orders:  basal rate: 0.55, I/C: 13, ISF: 50, timing: 4 hours, target: 110 with corrections over 110, max bolus: 10, and max basal rate: 1.2u/hr. She was shown how to bolus and and when to do correction boluses, and she re demonstrated this correctly.  She reported good understanding of this.  We reviewed all topics on the checklist and she signed the checklist as understanding all topics with no final questions. She will return in one week to review blood sugar readings and answer questions.  She was encouraged to call the office if blood sugars drop low, or remain over 225.  She agreed to do this.

## 2022-08-29 DIAGNOSIS — M1712 Unilateral primary osteoarthritis, left knee: Secondary | ICD-10-CM | POA: Diagnosis not present

## 2022-08-31 ENCOUNTER — Encounter (INDEPENDENT_AMBULATORY_CARE_PROVIDER_SITE_OTHER): Payer: Self-pay | Admitting: Nurse Practitioner

## 2022-08-31 NOTE — Progress Notes (Signed)
Varicose veins of left lower extremity with inflammation (454.1  I83.10) Current Plans   Indication: Patient presents with symptomatic varicose veins of the left lower extremity.   Procedure: Sclerotherapy using hypertonic saline mixed with 1% Lidocaine was performed on the left lower extremity. Compression wraps were placed. The patient tolerated the procedure well. 

## 2022-09-02 DIAGNOSIS — M9903 Segmental and somatic dysfunction of lumbar region: Secondary | ICD-10-CM | POA: Diagnosis not present

## 2022-09-02 DIAGNOSIS — M5383 Other specified dorsopathies, cervicothoracic region: Secondary | ICD-10-CM | POA: Diagnosis not present

## 2022-09-02 DIAGNOSIS — M9902 Segmental and somatic dysfunction of thoracic region: Secondary | ICD-10-CM | POA: Diagnosis not present

## 2022-09-02 DIAGNOSIS — M9901 Segmental and somatic dysfunction of cervical region: Secondary | ICD-10-CM | POA: Diagnosis not present

## 2022-09-02 DIAGNOSIS — M9904 Segmental and somatic dysfunction of sacral region: Secondary | ICD-10-CM | POA: Diagnosis not present

## 2022-09-02 DIAGNOSIS — M5136 Other intervertebral disc degeneration, lumbar region: Secondary | ICD-10-CM | POA: Diagnosis not present

## 2022-09-02 DIAGNOSIS — M5387 Other specified dorsopathies, lumbosacral region: Secondary | ICD-10-CM | POA: Diagnosis not present

## 2022-09-03 ENCOUNTER — Other Ambulatory Visit (INDEPENDENT_AMBULATORY_CARE_PROVIDER_SITE_OTHER): Payer: Medicare Other

## 2022-09-03 ENCOUNTER — Other Ambulatory Visit: Payer: Self-pay | Admitting: Internal Medicine

## 2022-09-03 ENCOUNTER — Other Ambulatory Visit: Payer: Medicare Other

## 2022-09-03 ENCOUNTER — Encounter: Payer: Medicare Other | Admitting: Nutrition

## 2022-09-03 DIAGNOSIS — L299 Pruritus, unspecified: Secondary | ICD-10-CM | POA: Diagnosis not present

## 2022-09-03 DIAGNOSIS — E1069 Type 1 diabetes mellitus with other specified complication: Secondary | ICD-10-CM | POA: Diagnosis not present

## 2022-09-03 DIAGNOSIS — E785 Hyperlipidemia, unspecified: Secondary | ICD-10-CM

## 2022-09-03 DIAGNOSIS — E1065 Type 1 diabetes mellitus with hyperglycemia: Secondary | ICD-10-CM

## 2022-09-03 DIAGNOSIS — E103293 Type 1 diabetes mellitus with mild nonproliferative diabetic retinopathy without macular edema, bilateral: Secondary | ICD-10-CM

## 2022-09-03 LAB — LIPID PANEL
Cholesterol: 159 mg/dL (ref 0–200)
HDL: 65 mg/dL (ref >39.00)
LDL Cholesterol: 78 mg/dL (ref 0–99)
NonHDL: 93.84
Total CHOL/HDL Ratio: 2
Triglycerides: 80 mg/dL (ref 0.0–149.0)
VLDL: 16 mg/dL (ref 0.0–40.0)

## 2022-09-03 LAB — COMPREHENSIVE METABOLIC PANEL
ALT: 22 U/L (ref 0–35)
AST: 27 U/L (ref 0–37)
Albumin: 4.2 g/dL (ref 3.5–5.2)
Alkaline Phosphatase: 88 U/L (ref 39–117)
BUN: 12 mg/dL (ref 6–23)
CO2: 28 mEq/L (ref 19–32)
Calcium: 9.3 mg/dL (ref 8.4–10.5)
Chloride: 95 mEq/L — ABNORMAL LOW (ref 96–112)
Creatinine, Ser: 0.7 mg/dL (ref 0.40–1.20)
GFR: 87.95 mL/min (ref >60.00)
Glucose, Bld: 180 mg/dL — ABNORMAL HIGH (ref 70–99)
Potassium: 4.4 mEq/L (ref 3.5–5.1)
Sodium: 130 mEq/L — ABNORMAL LOW (ref 135–145)
Total Bilirubin: 0.6 mg/dL (ref 0.2–1.2)
Total Protein: 7 g/dL (ref 6.0–8.3)

## 2022-09-04 ENCOUNTER — Other Ambulatory Visit: Payer: Medicare Other

## 2022-09-04 NOTE — Patient Instructions (Addendum)
Call OmniPod if questions  or problems with pump usage. Call office if blood sugars drop low on a consistant basis or remail high.[name]

## 2022-09-04 NOTE — Progress Notes (Addendum)
Patient reported no difficulty with bolusing, changing pods or communication issues with dexcom.  Patient appears to be bolusing before meals and has done 2 correction doses as needed.  79% of readings are in the target range for the first week of usage.  We reviewed remaining topics on pump checklist and patient reported good understanding and no final questions.

## 2022-09-05 DIAGNOSIS — M1712 Unilateral primary osteoarthritis, left knee: Secondary | ICD-10-CM | POA: Diagnosis not present

## 2022-09-09 ENCOUNTER — Ambulatory Visit (INDEPENDENT_AMBULATORY_CARE_PROVIDER_SITE_OTHER): Payer: Medicare Other | Admitting: Nurse Practitioner

## 2022-09-09 ENCOUNTER — Encounter (INDEPENDENT_AMBULATORY_CARE_PROVIDER_SITE_OTHER): Payer: Self-pay | Admitting: Nurse Practitioner

## 2022-09-09 VITALS — BP 152/70 | HR 87 | Resp 18 | Ht 65.0 in | Wt 179.4 lb

## 2022-09-09 DIAGNOSIS — I83819 Varicose veins of unspecified lower extremities with pain: Secondary | ICD-10-CM

## 2022-09-09 NOTE — Progress Notes (Signed)
The patient presented today for sclerotherapy however she has returned from recent sclerotherapy sessions.  She noted that they are more varicose veins today as well as some dark hardened areas from previous treatments.  We discussed that sometimes the case sclerotherapy additional varicosities can present.  We also discussed that due to the nature of sclerotherapy in her larger vessels the superficial varicosities can present with phlebitis like effects.  Currently she does not have any open wounds or ulcerations.  She is currently on a baby aspirin.  We discussed having the patient follow-up for reevaluation in several weeks however at this time the patient does not wish to have follow-up scheduled.  She notes that she will contact her office if there issues.  She will follow-up PRN.

## 2022-09-10 ENCOUNTER — Encounter (INDEPENDENT_AMBULATORY_CARE_PROVIDER_SITE_OTHER): Payer: Self-pay | Admitting: Nurse Practitioner

## 2022-09-17 ENCOUNTER — Other Ambulatory Visit: Payer: Self-pay | Admitting: Family Medicine

## 2022-09-25 ENCOUNTER — Other Ambulatory Visit: Payer: Self-pay | Admitting: Physician Assistant

## 2022-09-27 ENCOUNTER — Other Ambulatory Visit: Payer: Self-pay | Admitting: Family Medicine

## 2022-09-27 DIAGNOSIS — E785 Hyperlipidemia, unspecified: Secondary | ICD-10-CM

## 2022-09-30 DIAGNOSIS — M9904 Segmental and somatic dysfunction of sacral region: Secondary | ICD-10-CM | POA: Diagnosis not present

## 2022-09-30 DIAGNOSIS — M9901 Segmental and somatic dysfunction of cervical region: Secondary | ICD-10-CM | POA: Diagnosis not present

## 2022-09-30 DIAGNOSIS — M5136 Other intervertebral disc degeneration, lumbar region: Secondary | ICD-10-CM | POA: Diagnosis not present

## 2022-09-30 DIAGNOSIS — M5383 Other specified dorsopathies, cervicothoracic region: Secondary | ICD-10-CM | POA: Diagnosis not present

## 2022-09-30 DIAGNOSIS — M9903 Segmental and somatic dysfunction of lumbar region: Secondary | ICD-10-CM | POA: Diagnosis not present

## 2022-09-30 DIAGNOSIS — M5387 Other specified dorsopathies, lumbosacral region: Secondary | ICD-10-CM | POA: Diagnosis not present

## 2022-09-30 DIAGNOSIS — M9902 Segmental and somatic dysfunction of thoracic region: Secondary | ICD-10-CM | POA: Diagnosis not present

## 2022-10-05 ENCOUNTER — Other Ambulatory Visit: Payer: Self-pay | Admitting: Internal Medicine

## 2022-10-05 DIAGNOSIS — E1065 Type 1 diabetes mellitus with hyperglycemia: Secondary | ICD-10-CM

## 2022-10-20 ENCOUNTER — Encounter (INDEPENDENT_AMBULATORY_CARE_PROVIDER_SITE_OTHER): Payer: Self-pay

## 2022-10-28 DIAGNOSIS — M9901 Segmental and somatic dysfunction of cervical region: Secondary | ICD-10-CM | POA: Diagnosis not present

## 2022-10-28 DIAGNOSIS — M9904 Segmental and somatic dysfunction of sacral region: Secondary | ICD-10-CM | POA: Diagnosis not present

## 2022-10-28 DIAGNOSIS — M5387 Other specified dorsopathies, lumbosacral region: Secondary | ICD-10-CM | POA: Diagnosis not present

## 2022-10-28 DIAGNOSIS — M9902 Segmental and somatic dysfunction of thoracic region: Secondary | ICD-10-CM | POA: Diagnosis not present

## 2022-10-28 DIAGNOSIS — M5136 Other intervertebral disc degeneration, lumbar region: Secondary | ICD-10-CM | POA: Diagnosis not present

## 2022-10-28 DIAGNOSIS — M5383 Other specified dorsopathies, cervicothoracic region: Secondary | ICD-10-CM | POA: Diagnosis not present

## 2022-10-28 DIAGNOSIS — M9903 Segmental and somatic dysfunction of lumbar region: Secondary | ICD-10-CM | POA: Diagnosis not present

## 2022-11-11 DIAGNOSIS — D225 Melanocytic nevi of trunk: Secondary | ICD-10-CM | POA: Diagnosis not present

## 2022-11-11 DIAGNOSIS — D2262 Melanocytic nevi of left upper limb, including shoulder: Secondary | ICD-10-CM | POA: Diagnosis not present

## 2022-11-11 DIAGNOSIS — L570 Actinic keratosis: Secondary | ICD-10-CM | POA: Diagnosis not present

## 2022-11-11 DIAGNOSIS — D2261 Melanocytic nevi of right upper limb, including shoulder: Secondary | ICD-10-CM | POA: Diagnosis not present

## 2022-11-11 DIAGNOSIS — L821 Other seborrheic keratosis: Secondary | ICD-10-CM | POA: Diagnosis not present

## 2022-11-11 DIAGNOSIS — D2272 Melanocytic nevi of left lower limb, including hip: Secondary | ICD-10-CM | POA: Diagnosis not present

## 2022-11-11 DIAGNOSIS — D2271 Melanocytic nevi of right lower limb, including hip: Secondary | ICD-10-CM | POA: Diagnosis not present

## 2022-11-12 ENCOUNTER — Ambulatory Visit (INDEPENDENT_AMBULATORY_CARE_PROVIDER_SITE_OTHER): Payer: Medicare Other | Admitting: Internal Medicine

## 2022-11-12 ENCOUNTER — Encounter: Payer: Self-pay | Admitting: Internal Medicine

## 2022-11-12 VITALS — BP 136/84 | HR 72 | Ht 65.0 in | Wt 182.0 lb

## 2022-11-12 DIAGNOSIS — E039 Hypothyroidism, unspecified: Secondary | ICD-10-CM | POA: Diagnosis not present

## 2022-11-12 DIAGNOSIS — M9902 Segmental and somatic dysfunction of thoracic region: Secondary | ICD-10-CM | POA: Diagnosis not present

## 2022-11-12 DIAGNOSIS — M5387 Other specified dorsopathies, lumbosacral region: Secondary | ICD-10-CM | POA: Diagnosis not present

## 2022-11-12 DIAGNOSIS — M5136 Other intervertebral disc degeneration, lumbar region: Secondary | ICD-10-CM | POA: Diagnosis not present

## 2022-11-12 DIAGNOSIS — M9901 Segmental and somatic dysfunction of cervical region: Secondary | ICD-10-CM | POA: Diagnosis not present

## 2022-11-12 DIAGNOSIS — M5383 Other specified dorsopathies, cervicothoracic region: Secondary | ICD-10-CM | POA: Diagnosis not present

## 2022-11-12 DIAGNOSIS — M9903 Segmental and somatic dysfunction of lumbar region: Secondary | ICD-10-CM | POA: Diagnosis not present

## 2022-11-12 DIAGNOSIS — E1065 Type 1 diabetes mellitus with hyperglycemia: Secondary | ICD-10-CM

## 2022-11-12 DIAGNOSIS — M9904 Segmental and somatic dysfunction of sacral region: Secondary | ICD-10-CM | POA: Diagnosis not present

## 2022-11-12 LAB — MICROALBUMIN / CREATININE URINE RATIO
Creatinine,U: 24.4 mg/dL
Microalb Creat Ratio: 2.9 mg/g (ref 0.0–30.0)
Microalb, Ur: <0.7 mg/dL (ref 0.0–1.9)

## 2022-11-12 LAB — POCT GLYCOSYLATED HEMOGLOBIN (HGB A1C): Hemoglobin A1C: 7 % — AB (ref 4.0–5.6)

## 2022-11-12 LAB — TSH: TSH: 1.56 u[IU]/mL (ref 0.35–5.50)

## 2022-11-12 MED ORDER — OMNIPOD 5 DEXG7G6 PODS GEN 5 MISC: 1.0000 | 3 refills | Status: DC

## 2022-11-12 MED ORDER — ACCU-CHEK GUIDE VI STRP: 1.0000 | ORAL_STRIP | Freq: Every day | 3 refills | Status: DC

## 2022-11-12 NOTE — Progress Notes (Signed)
Name: Karla Patton  MRN/ DOB: [id number], 1951/06/04   Age/ Sex: 71 y.o., female    PCP: Waunita Schooner, MD   Reason for Endocrinology Evaluation: Type 1 Diabetes Mellitus     Date of Initial Endocrinology Visit: 02/27/2022    PATIENT IDENTIFIER: Karla Patton is a 71 y.o. female with a past medical history of T1DM, Hypothyroidism, Dyslipidemia . The patient presented for initial endocrinology clinic visit on 02/27/2022 for consultative assistance with her diabetes management.    HPI: Karla Patton was    Diagnosed with DM at age 56  Prior Medications tried/Intolerance: She was on Medtronic but did not like the tubing   Hemoglobin A1c has ranged from 6.8% in 2022, peaking at 8.9% in 2019. Patient required assistance for hypoglycemia: yes x2 over the years    On her initial visit to our clinic, her A1c is 7.5% she was on Lantus, and NovoLog which we adjusted   She was started on OmniPod in September 2023  SUBJECTIVE:   During the last visit (02/27/2022): A1c 7.7%   Today (11/12/22): Karla Patton is here for follow-up on diabetes management.  She checks her blood sugars multiple  times daily, through CGM .The patient has  had hypoglycemic episodes since the last clinic visit     She continues with constipation, uses Senna  Denies nausea or vomiting  She started on Golo tablets but has not strictly followed the rest of the program  This patient with type 1 diabetes is treated with OmniPod (insulin pump). During the visit the pump basal and bolus doses were reviewed including carb/insulin rations and supplemental doses. The clinical list was updated. The glucose meter download was reviewed in detail to determine if the current pump settings are providing the best glycemic control without excessive hypoglycemia.  Pump and meter download:    Pump   OmniPod Settings   Insulin type   NovoLog   Basal rate       0000 0.55 u/h               I:C ratio       0000  1:13                    Sensitivity       0000  50      Goal       0000  110             Type & Model of Pump: OmniPod Insulin Type: Currently using NovoLog.  Body mass index is 30.29 kg/m.  PUMP STATISTICS: Average BG: 151  Average Daily Carbs (g): 155.5  Average Total Daily Insulin: 30.1  Average Daily Basal: 18.1 (60 %) Average Daily Bolus: 12 (40 %)     HOME DIABETES REGIMEN: NovoLog     Statin: yes ACE-I/ARB: yes     CONTINUOUS GLUCOSE MONITORING RECORD INTERPRETATION    Dates of Recording:11/9-11/22/2023  Sensor description: dexcom  Results statistics:   CGM use % of time 92.5  Average and SD 151/42  Time in range  75 %  % Time Above 180 22  % Time above 250 2  % Time Below target < 1       Glycemic patterns summary: BG's optimal overnight and trend up during the day   Hyperglycemic episodes  postprandial  Hypoglycemic episodes occurred sporadically during the day and night   Overnight periods: stable        DIABETIC COMPLICATIONS: Microvascular complications:  Non proliferative DR, neuropathy  Denies: CKD  Last eye exam: Completed 01/03/2022  Macrovascular complications:   Denies: CAD, PVD, CVA   PAST HISTORY: Past Medical History:  Past Medical History:  Diagnosis Date   Allergy 11/2021   Lisinolpril-cough   Anemia    Anxiety    Arthritis    Back pain    Diabetes mellitus without complication (HCC)    type 1   GERD (gastroesophageal reflux disease)    Heart murmur    Hypothyroidism    Thyroid disease    Varicose vein    Past Surgical History:  Past Surgical History:  Procedure Laterality Date   ABDOMINAL HYSTERECTOMY  1994   CHOLECYSTECTOMY  09/07/2013   COLONOSCOPY  2009   normal   COLONOSCOPY WITH PROPOFOL N/A 10/20/2018   Procedure: COLONOSCOPY WITH PROPOFOL;  Surgeon: Robert Bellow, MD;  Location: [facility] ENDOSCOPY;  Service: Endoscopy;  Laterality: N/A;   INCONTINENCE SURGERY  2003   left leg vein  ligation  2015   SPINE SURGERY  03-18-2021   VARICOSE VEIN SURGERY  1990's   Dr Bary Castilla   VEIN SURGERY Left 11/17/2017   vein closure procedure/Dr Jamal Collin    Social History:  reports that she has never smoked. She has never used smokeless tobacco. She reports current alcohol use of about 9.0 standard drinks of alcohol per week. She reports that she does not use drugs. Family History:  Family History  Problem Relation Age of Onset   Diabetes Mother    Pancreatitis Mother    Heart disease Father    Stroke Father        x 2   Hypertension Father    Varicose Veins Father    Fibromyalgia Sister    Scoliosis Sister    Arthritis Sister    Diabetes Sister    Breast cancer Neg Hx      HOME MEDICATIONS: Allergies as of 11/12/2022       Reactions   Lisinopril Cough        Medication List        Accurate as of November 12, 2022  9:26 AM. If you have any questions, ask your nurse or doctor.          STOP taking these medications    Contour Next Test test strip Generic drug: glucose blood Stopped by: Dorita Sciara, MD       TAKE these medications    ABC PLUS SENIOR ADULTS 50+ PO Take 1 tablet by mouth daily.   acetaminophen 650 MG CR tablet Commonly known as: TYLENOL Take 650 mg by mouth every 8 (eight) hours as needed for pain.   APPLE CIDER VINEGAR PO Take 600 mg by mouth daily.   aspirin EC 81 MG tablet Take 81 mg by mouth daily. Takes at bedtime   beta carotene 25000 UNIT capsule Take 25,000 Units by mouth daily.   Dexcom G6 Sensor Misc UsE TO CHECK BLOOD SUGAR THROUGHOUT THE DAY REPLACE EVERY 14 DAYS   fluticasone 50 MCG/ACT nasal spray Commonly known as: FLONASE SPRAY 2 SPRAYS INTO EACH NOSTRIL EVERY DAY   furosemide 20 MG tablet Commonly known as: LASIX TAKE 1 TABLET BY MOUTH DAILY   High Potency Iron 65 MG Tabs Take 1 tablet by mouth daily.   insulin aspart 100 UNIT/ML injection Commonly known as: NovoLOG Max Daily 50 units per  pump   levothyroxine 100 MCG tablet Commonly known as: SYNTHROID Take 1 tablet (100 mcg total) by mouth  daily before breakfast.   MAGNESIUM-ZINC PO Take 1 tablet by mouth daily.   omeprazole 40 MG capsule Commonly known as: PRILOSEC TAKE 1 CAPSULE(40 MG) BY MOUTH DAILY   Omnipod 5 G6 Pod (Gen 5) Misc 1 Device by Does not apply route every 3 (three) days.   Omnipod 5 G6 Intro (Gen 5) Kit USE 1 DEVICE EVERY 3 DAYS   potassium chloride SA 20 MEQ tablet Commonly known as: KLOR-CON M TAKE 1 TABLET BY MOUTH EVERY DAY WITH FUROSEMIDE AS NEEDED AS DIRECTED   pregabalin 225 MG capsule Commonly known as: LYRICA Take 225 mg by mouth daily.   rosuvastatin 10 MG tablet Commonly known as: CRESTOR TAKE 1 TABLET BY MOUTH EVERY DAY   Vitamin D3 250 MCG (10000 UT) capsule Take 10,000 Units by mouth daily.         ALLERGIES: Allergies  Allergen Reactions   Lisinopril Cough         OBJECTIVE:   VITAL SIGNS: BP 136/84 (BP Location: Left Arm, Patient Position: Sitting, Cuff Size: Large)   Pulse 72   Ht _0  (1.651 m)   Wt 182 lb (82.6 kg)   SpO2 99%   BMI 30.29 kg/m      PHYSICAL EXAM:  General: Pt appears well and is in NAD  Lungs: Clear with good BS bilat with no rales, rhonchi, or wheezes  Heart: RRR   Abdomen: Normoactive bowel sounds, soft, nontender, without masses or organomegaly palpable  Extremities:  Lower extremities - trace    Neuro: MS is good with appropriate affect, pt is alert and Ox3    DM foot exam: 02/27/2022  The skin of the feet is without sores or ulcerations, right foot deformity  The pedal pulses are 2+ on right and 2+ on left. The sensation is decreased  to a screening 5.07, 10 gram monofilament on the right     DATA REVIEWED:  Lab Results  Component Value Date   HGBA1C 7.0 (A) 11/12/2022   HGBA1C 7.7 (A) 06/30/2022   HGBA1C 7.5 (H) 01/07/2022   Lab Results  Component Value Date   CHOL 159 09/03/2022   HDL 65.00 09/03/2022    LDLCALC 78 09/03/2022   TRIG 80.0 09/03/2022   CHOLHDL 2 09/03/2022        Latest Reference Range & Units 11/12/22 09:40  MICROALB/CREAT RATIO 0.0 - 30.0 mg/g 2.9    Latest Reference Range & Units 11/12/22 09:40  TSH 0.35 - 5.50 uIU/mL 1.56    Latest Reference Range & Units 11/12/22 09:40  Creatinine,U mg/dL 24.4  Microalb, Ur 0.0 - 1.9 mg/dL <0.7  MICROALB/CREAT RATIO 0.0 - 30.0 mg/g 2.9     Latest Reference Range & Units 05/01/15 14:41  Glutamic Acid Decarb Ab < IU/mL >250 (H)   ASSESSMENT / PLAN / RECOMMENDATIONS:   1) Type 1 Diabetes Mellitus, OPtimally controlled, With neuropathic and retinopathic complications - Most recent A1c of 7.0 %. Goal A1c < 7.0 %.    - Her A1c has optimized  - In reviewing her dexcom/pump download her within target range has improved from 58% to 75%  -I will reduce her basal rate of insulin as she has been noted with occasional hypoglycemia during the day, I will also adjust her insulin to carb ratio as she has been noted with hyperglycemia post meals   MEDICATIONS:     Pump   OmniPod Settings   Insulin type   NovoLog   Basal rate  0000 0.50 u/h               I:C ratio       0000  1:11                  Sensitivity       0000  50      Goal       0000  110          EDUCATION / INSTRUCTIONS: BG monitoring instructions: Patient is instructed to check her blood sugars 3 times a day, before meals. Call [place] [facility] clinic if: BG persistently < 70 I reviewed the Rule of 15 for the treatment of hypoglycemia in detail with the patient. Literature supplied.   2) Diabetic complications:  Eye: Does  have known diabetic retinopathy.  Neuro/ Feet: Does  have known diabetic peripheral neuropathy. Renal: Patient does not have known baseline CKD. She is  on an ACEI/ARB at present.      3) Hypothyroidism:   -TSH is normal   Medication Levothyroxine 100 mcg daily   F/U in 6 months    Signed electronically  by: Mack Guise, MD  [facility]  [address] [place], [place] [address] Phone: [phone] FAX: [phone]   CC: Waunita Schooner, [place] [address] Phone: [phone]  Fax: [phone]    Return to Endocrinology clinic as below: Future Appointments  Date Time Provider [place]  11/12/2022  9:30 AM Tyqwan Pink, Melanie Crazier, MD [facility] None  12/29/2022 10:00 AM Jinny Sanders, MD [facility]

## 2022-11-12 NOTE — Patient Instructions (Signed)

## 2022-11-17 MED ORDER — LEVOTHYROXINE SODIUM 100 MCG PO TABS: 100.0000 ug | ORAL_TABLET | Freq: Every day | ORAL | 3 refills | Status: DC

## 2022-11-20 ENCOUNTER — Other Ambulatory Visit: Payer: Self-pay | Admitting: Internal Medicine

## 2022-11-20 DIAGNOSIS — E1065 Type 1 diabetes mellitus with hyperglycemia: Secondary | ICD-10-CM

## 2022-11-26 DIAGNOSIS — M9904 Segmental and somatic dysfunction of sacral region: Secondary | ICD-10-CM | POA: Diagnosis not present

## 2022-11-26 DIAGNOSIS — M5387 Other specified dorsopathies, lumbosacral region: Secondary | ICD-10-CM | POA: Diagnosis not present

## 2022-11-26 DIAGNOSIS — M9902 Segmental and somatic dysfunction of thoracic region: Secondary | ICD-10-CM | POA: Diagnosis not present

## 2022-11-26 DIAGNOSIS — M9901 Segmental and somatic dysfunction of cervical region: Secondary | ICD-10-CM | POA: Diagnosis not present

## 2022-11-26 DIAGNOSIS — M9903 Segmental and somatic dysfunction of lumbar region: Secondary | ICD-10-CM | POA: Diagnosis not present

## 2022-11-26 DIAGNOSIS — M5136 Other intervertebral disc degeneration, lumbar region: Secondary | ICD-10-CM | POA: Diagnosis not present

## 2022-11-26 DIAGNOSIS — M5383 Other specified dorsopathies, cervicothoracic region: Secondary | ICD-10-CM | POA: Diagnosis not present

## 2022-12-09 DIAGNOSIS — Z23 Encounter for immunization: Secondary | ICD-10-CM | POA: Diagnosis not present

## 2022-12-10 DIAGNOSIS — M9904 Segmental and somatic dysfunction of sacral region: Secondary | ICD-10-CM | POA: Diagnosis not present

## 2022-12-10 DIAGNOSIS — M9903 Segmental and somatic dysfunction of lumbar region: Secondary | ICD-10-CM | POA: Diagnosis not present

## 2022-12-10 DIAGNOSIS — M5383 Other specified dorsopathies, cervicothoracic region: Secondary | ICD-10-CM | POA: Diagnosis not present

## 2022-12-10 DIAGNOSIS — M5387 Other specified dorsopathies, lumbosacral region: Secondary | ICD-10-CM | POA: Diagnosis not present

## 2022-12-10 DIAGNOSIS — M9901 Segmental and somatic dysfunction of cervical region: Secondary | ICD-10-CM | POA: Diagnosis not present

## 2022-12-10 DIAGNOSIS — M5136 Other intervertebral disc degeneration, lumbar region: Secondary | ICD-10-CM | POA: Diagnosis not present

## 2022-12-10 DIAGNOSIS — M9902 Segmental and somatic dysfunction of thoracic region: Secondary | ICD-10-CM | POA: Diagnosis not present

## 2022-12-18 ENCOUNTER — Other Ambulatory Visit: Payer: Self-pay | Admitting: Internal Medicine

## 2022-12-18 DIAGNOSIS — E1065 Type 1 diabetes mellitus with hyperglycemia: Secondary | ICD-10-CM

## 2022-12-21 ENCOUNTER — Encounter: Payer: Self-pay | Admitting: Internal Medicine

## 2022-12-24 DIAGNOSIS — M9901 Segmental and somatic dysfunction of cervical region: Secondary | ICD-10-CM | POA: Diagnosis not present

## 2022-12-24 DIAGNOSIS — M5136 Other intervertebral disc degeneration, lumbar region: Secondary | ICD-10-CM | POA: Diagnosis not present

## 2022-12-24 DIAGNOSIS — M9902 Segmental and somatic dysfunction of thoracic region: Secondary | ICD-10-CM | POA: Diagnosis not present

## 2022-12-24 DIAGNOSIS — M5387 Other specified dorsopathies, lumbosacral region: Secondary | ICD-10-CM | POA: Diagnosis not present

## 2022-12-24 DIAGNOSIS — M9903 Segmental and somatic dysfunction of lumbar region: Secondary | ICD-10-CM | POA: Diagnosis not present

## 2022-12-24 DIAGNOSIS — M5383 Other specified dorsopathies, cervicothoracic region: Secondary | ICD-10-CM | POA: Diagnosis not present

## 2022-12-24 DIAGNOSIS — M9904 Segmental and somatic dysfunction of sacral region: Secondary | ICD-10-CM | POA: Diagnosis not present

## 2022-12-29 ENCOUNTER — Encounter: Payer: Medicare Other | Admitting: Family Medicine

## 2022-12-29 DIAGNOSIS — Z23 Encounter for immunization: Secondary | ICD-10-CM | POA: Diagnosis not present

## 2022-12-31 ENCOUNTER — Encounter: Payer: Self-pay | Admitting: Family Medicine

## 2022-12-31 ENCOUNTER — Ambulatory Visit (INDEPENDENT_AMBULATORY_CARE_PROVIDER_SITE_OTHER): Payer: Medicare Other | Admitting: Family Medicine

## 2022-12-31 VITALS — BP 138/68 | HR 74 | Temp 97.8°F | Resp 16 | Ht 65.0 in | Wt 180.5 lb

## 2022-12-31 DIAGNOSIS — I152 Hypertension secondary to endocrine disorders: Secondary | ICD-10-CM

## 2022-12-31 DIAGNOSIS — E669 Obesity, unspecified: Secondary | ICD-10-CM | POA: Diagnosis not present

## 2022-12-31 DIAGNOSIS — E113299 Type 2 diabetes mellitus with mild nonproliferative diabetic retinopathy without macular edema, unspecified eye: Secondary | ICD-10-CM | POA: Diagnosis not present

## 2022-12-31 DIAGNOSIS — I83893 Varicose veins of bilateral lower extremities with other complications: Secondary | ICD-10-CM | POA: Diagnosis not present

## 2022-12-31 DIAGNOSIS — E1159 Type 2 diabetes mellitus with other circulatory complications: Secondary | ICD-10-CM | POA: Diagnosis not present

## 2022-12-31 DIAGNOSIS — R600 Localized edema: Secondary | ICD-10-CM

## 2022-12-31 DIAGNOSIS — M545 Low back pain, unspecified: Secondary | ICD-10-CM | POA: Diagnosis not present

## 2022-12-31 DIAGNOSIS — M1712 Unilateral primary osteoarthritis, left knee: Secondary | ICD-10-CM | POA: Insufficient documentation

## 2022-12-31 DIAGNOSIS — E1043 Type 1 diabetes mellitus with diabetic autonomic (poly)neuropathy: Secondary | ICD-10-CM | POA: Diagnosis not present

## 2022-12-31 DIAGNOSIS — K219 Gastro-esophageal reflux disease without esophagitis: Secondary | ICD-10-CM | POA: Diagnosis not present

## 2022-12-31 DIAGNOSIS — E1069 Type 1 diabetes mellitus with other specified complication: Secondary | ICD-10-CM | POA: Diagnosis not present

## 2022-12-31 DIAGNOSIS — G8929 Other chronic pain: Secondary | ICD-10-CM

## 2022-12-31 DIAGNOSIS — E103293 Type 1 diabetes mellitus with mild nonproliferative diabetic retinopathy without macular edema, bilateral: Secondary | ICD-10-CM

## 2022-12-31 DIAGNOSIS — E039 Hypothyroidism, unspecified: Secondary | ICD-10-CM | POA: Diagnosis not present

## 2022-12-31 DIAGNOSIS — E785 Hyperlipidemia, unspecified: Secondary | ICD-10-CM

## 2022-12-31 DIAGNOSIS — E1143 Type 2 diabetes mellitus with diabetic autonomic (poly)neuropathy: Secondary | ICD-10-CM | POA: Insufficient documentation

## 2022-12-31 DIAGNOSIS — D1803 Hemangioma of intra-abdominal structures: Secondary | ICD-10-CM

## 2022-12-31 LAB — HM DIABETES FOOT EXAM

## 2022-12-31 MED ORDER — FUROSEMIDE 20 MG PO TABS: ORAL_TABLET | ORAL | 3 refills | Status: DC

## 2022-12-31 MED ORDER — POTASSIUM CHLORIDE CRYS ER 20 MEQ PO TBCR: 20.0000 meq | EXTENDED_RELEASE_TABLET | Freq: Every day | ORAL | 3 refills | Status: DC

## 2022-12-31 NOTE — Assessment & Plan Note (Signed)
Chronic, Almost at goal < 70 on crestor 10 mg daily

## 2022-12-31 NOTE — Patient Instructions (Signed)
You can try to wean off  omeprazole with OTC 20 mg if able over time.

## 2022-12-31 NOTE — Assessment & Plan Note (Addendum)
S/P steroid injections and hyaluronic acid injections q 6 months. [facility] Dr. Harlow Mares. Currently using acetaminophen prn.

## 2022-12-31 NOTE — Assessment & Plan Note (Signed)
Stable, chronic.  Continue current medication.  Levo 100 mcg daily

## 2022-12-31 NOTE — Assessment & Plan Note (Deleted)
Chronic, followed by endocrine Stable control on Lyrica to 25 mg p.o. daily

## 2022-12-31 NOTE — Assessment & Plan Note (Signed)
Well controlled on no medication  

## 2022-12-31 NOTE — Assessment & Plan Note (Addendum)
Chronic, followed by endocrine Stable control on Lyrica to 225 mg p.o. daily

## 2022-12-31 NOTE — Assessment & Plan Note (Signed)
Encouraged exercise, weight loss, healthy eating habits. ? ?

## 2022-12-31 NOTE — Assessment & Plan Note (Signed)
2019 Stable hyperechoic mass in the right lobe of liver supporting benign etiology. It is consistent with a hemangioma. No new liver mass.

## 2022-12-31 NOTE — Assessment & Plan Note (Signed)
Due to diabetes, followed by ophthalmology [facility].

## 2022-12-31 NOTE — Progress Notes (Signed)
Patient ID: Karla Patton, female    DOB: January 16, 1951, 72 y.o.   MRN: [id number]  This visit was conducted in person.  BP 138/68   Pulse 74   Temp 97.8 F (36.6 C)   Resp 16   Ht '5\' 5"'$  (1.651 m)   Wt 180 lb 8 oz (81.9 kg)   SpO2 98%   BMI 30.04 kg/m    CC:  Chief Complaint  Patient presents with   Establish Care    Transferring from Dr. Einar Pheasant    Subjective:   HPI: Karla Patton is a 72 y.o. female presenting on 12/31/2022 for Establish Care (Transferring from Dr. Einar Pheasant)  Last PCP: Einar Pheasant Last CPX:  ?  Has AMW scheduled in 01/07/2022.  Lasb CPX done at [facility].  Uptodate with health Maintenance  Diabetes type 1 ( Dx 33) , hypothyroidism followed by [facility].  Reviewed recent office visit note from November 12, 2022 Was on insulin pump ( but now is no longer covered) and continuous blood sugar meter.  Lantus  20 units daily, SSI novolog. Lab Results  Component Value Date   HGBA1C 7.0 (A) 11/12/2022    Diabetes is associated with neuropathy and retinopathy   Hypercholesterol: Almost at goal < 70 on crestor 10 mg daily Lab Results  Component Value Date   CHOL 159 09/03/2022   HDL 65.00 09/03/2022   LDLCALC 78 09/03/2022   TRIG 80.0 09/03/2022   CHOLHDL 2 09/03/2022     HTN: Well-controlled in past.. pt not aware of Dx in past.  Ay home 130/65 BP Readings from Last 3 Encounters:  12/31/22 138/68  11/12/22 136/84  09/09/22 (!) 152/70   Varicose veins with pain and swelling followed by vascular.  Using Lasix 20 mg p.o. daily  Relevant past medical, surgical, family and social history reviewed and updated as indicated. Interim medical history since our last visit reviewed. Allergies and medications reviewed and updated. Outpatient Medications Prior to Visit  Medication Sig Dispense Refill   acetaminophen (TYLENOL) 650 MG CR tablet Take 650 mg by mouth every 8 (eight) hours as needed for pain.     APPLE CIDER VINEGAR PO Take 600 mg by mouth  daily.      aspirin EC 81 MG tablet Take 81 mg by mouth daily. Takes at bedtime     beta carotene 25000 UNIT capsule Take 25,000 Units by mouth daily.     Cholecalciferol (VITAMIN D3) 250 MCG (10000 UT) capsule Take 10,000 Units by mouth daily.     Continuous Blood Gluc Sensor (DEXCOM G6 SENSOR) MISC UsE TO CHECK BLOOD SUGAR THROUGHOUT THE DAY REPLACE EVERY 14 DAYS 9 each 5   Ferrous Sulfate Dried (HIGH POTENCY IRON) 65 MG TABS Take 1 tablet by mouth daily.     fluticasone (FLONASE) 50 MCG/ACT nasal spray SPRAY 2 SPRAYS INTO EACH NOSTRIL EVERY DAY 48 mL 1   furosemide (LASIX) 20 MG tablet TAKE 1 TABLET BY MOUTH DAILY 90 tablet 2   glucose blood (ACCU-CHEK GUIDE) test strip 1 each by Other route daily in the afternoon. Use as instructed 100 each 3   insulin aspart (NOVOLOG) 100 UNIT/ML injection Max Daily 50 units per pump 50 mL 11   levothyroxine (SYNTHROID) 100 MCG tablet Take 1 tablet (100 mcg total) by mouth daily before breakfast. 90 tablet 3   MAGNESIUM-ZINC PO Take 1 tablet by mouth daily.     Multiple Vitamins-Minerals (ABC PLUS SENIOR ADULTS 50+ PO) Take 1 tablet  by mouth daily.     omeprazole (PRILOSEC) 40 MG capsule TAKE 1 CAPSULE(40 MG) BY MOUTH DAILY 90 capsule 1   potassium chloride SA (KLOR-CON M) 20 MEQ tablet TAKE 1 TABLET BY MOUTH EVERY DAY WITH FUROSEMIDE AS NEEDED AS DIRECTED 90 tablet 0   pregabalin (LYRICA) 225 MG capsule Take 225 mg by mouth daily.     rosuvastatin (CRESTOR) 10 MG tablet TAKE 1 TABLET BY MOUTH EVERY DAY 90 tablet 1   Insulin Disposable Pump (OMNIPOD 5 G6 INTRO, GEN 5,) KIT USE 1 DEVICE EVERY 3 DAYS (Patient not taking: Reported on 12/31/2022) 1 kit 0   Insulin Disposable Pump (OMNIPOD 5 G6 POD, GEN 5,) MISC 1 Device by Does not apply route every 3 (three) days. (Patient not taking: Reported on 12/31/2022) 6 each 3   No facility-administered medications prior to visit.     Per HPI unless specifically indicated in ROS section below Review of Systems   Constitutional:  Negative for fatigue and fever.  HENT:  Negative for congestion.   Eyes:  Negative for pain.  Respiratory:  Negative for cough and shortness of breath.   Cardiovascular:  Negative for chest pain, palpitations and leg swelling.  Gastrointestinal:  Negative for abdominal pain.  Genitourinary:  Negative for dysuria and vaginal bleeding.  Musculoskeletal:  Negative for back pain.  Neurological:  Negative for syncope, light-headedness and headaches.  Psychiatric/Behavioral:  Negative for dysphoric mood.    Objective:  BP 138/68   Pulse 74   Temp 97.8 F (36.6 C)   Resp 16   Ht '5\' 5"'$  (1.651 m)   Wt 180 lb 8 oz (81.9 kg)   SpO2 98%   BMI 30.04 kg/m   Wt Readings from Last 3 Encounters:  12/31/22 180 lb 8 oz (81.9 kg)  11/12/22 182 lb (82.6 kg)  09/09/22 179 lb 6.4 oz (81.4 kg)      Physical Exam Constitutional:      General: She is not in acute distress.    Appearance: Normal appearance. She is well-developed. She is obese. She is not ill-appearing or toxic-appearing.  HENT:     Head: Normocephalic.     Right Ear: Hearing, tympanic membrane, ear canal and external ear normal. Tympanic membrane is not erythematous, retracted or bulging.     Left Ear: Hearing, tympanic membrane, ear canal and external ear normal. Tympanic membrane is not erythematous, retracted or bulging.     Nose: No mucosal edema or rhinorrhea.     Right Sinus: No maxillary sinus tenderness or frontal sinus tenderness.     Left Sinus: No maxillary sinus tenderness or frontal sinus tenderness.     Mouth/Throat:     Pharynx: Uvula midline.  Eyes:     General: Lids are normal. Lids are everted, no foreign bodies appreciated.     Conjunctiva/sclera: Conjunctivae normal.     Pupils: Pupils are equal, round, and reactive to light.  Neck:     Thyroid: No thyroid mass or thyromegaly.     Vascular: No carotid bruit.     Trachea: Trachea normal.  Cardiovascular:     Rate and Rhythm: Normal rate  and regular rhythm.     Pulses: Normal pulses.     Heart sounds: Normal heart sounds, S1 normal and S2 normal. No murmur heard.    No friction rub. No gallop.  Pulmonary:     Effort: Pulmonary effort is normal. No tachypnea or respiratory distress.     Breath sounds: Normal breath  sounds. No decreased breath sounds, wheezing, rhonchi or rales.  Abdominal:     General: Bowel sounds are normal.     Palpations: Abdomen is soft.     Tenderness: There is no abdominal tenderness.  Musculoskeletal:     Cervical back: Normal range of motion and neck supple.  Skin:    General: Skin is warm and dry.     Findings: No rash.  Neurological:     Mental Status: She is alert.  Psychiatric:        Mood and Affect: Mood is not anxious or depressed.        Speech: Speech normal.        Behavior: Behavior normal. Behavior is cooperative.        Thought Content: Thought content normal.        Judgment: Judgment normal.    Diabetic foot exam: Normal inspection except old injury to right great toe No skin breakdown, bilateral varicose veins. No calluses  Normal DP pulses Normal sensation to light touch and monofilament except in right great toe tip Nails normal     Results for orders placed or performed in visit on 11/12/22  TSH  Result Value Ref Range   TSH 1.56 0.35 - 5.50 uIU/mL  Microalbumin / creatinine urine ratio  Result Value Ref Range   Microalb, Ur <0.7 0.0 - 1.9 mg/dL   Creatinine,U 24.4 mg/dL   Microalb Creat Ratio 2.9 0.0 - 30.0 mg/g  POCT glycosylated hemoglobin (Hb A1C)  Result Value Ref Range   Hemoglobin A1C 7.0 (A) 4.0 - 5.6 %   HbA1c POC (<> result, manual entry)     HbA1c, POC (prediabetic range)     HbA1c, POC (controlled diabetic range)      Assessment and Plan 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  Reviewed recent office visit note from  November 12, 2022 On insulin pump and continuous blood sugar meter.   Hypertension associated with diabetes ([place]) Assessment & Plan:  Well controlled on no medication.  Orders: -     Furosemide; TAKE 1 TABLET BY MOUTH DAILY  Dispense: 90 tablet; Refill: 3  Bilateral lower extremity edema -     Furosemide; TAKE 1 TABLET BY MOUTH DAILY  Dispense: 90 tablet; Refill: 3  Background diabetic retinopathy ([place]) Assessment & Plan: Due to diabetes, followed by ophthalmology [facility].   Diabetic autonomic neuropathy associated with type 1 diabetes mellitus ([place]) Assessment & Plan: Chronic, followed by endocrine Stable control on Lyrica to 225 mg p.o. daily   Hypothyroidism, unspecified type Assessment & Plan: Stable, chronic.  Continue current medication.  Levo 100 mcg daily   Varicose veins of lower extremity with edema, bilateral Assessment & Plan: Varicose veins with pain and swelling followed by vascular. Using Lasix 20 mg p.o. daily along with OTC potassium.   Chronic bilateral low back pain without sciatica Assessment & Plan:  S/p laminectomy 2022. Dr. Arnoldo Morale Due to arthritis and spinal stenosis. Currently using acetaminophen prn.   Localized osteoarthritis of left knee Assessment & Plan:  S/P steroid injections and hyaluronic acid injections q 6 months. [facility] Dr. Harlow Mares. Currently using acetaminophen prn.   Gastroesophageal reflux disease without esophagitis Assessment & Plan: Stable, chronic.    Omeprazole 40 mg daily.   Hyperlipidemia due to type 1 diabetes mellitus ([place]) Assessment & Plan:  Chronic, Almost at goal < 70 on crestor  10 mg daily   Obesity (BMI 30-39.9) Assessment & Plan: Encouraged exercise, weight loss, healthy eating habits.    Liver hemangioma Assessment & Plan: 2019 Stable hyperechoic mass in the right lobe of liver supporting benign etiology. It is consistent with a hemangioma. No new liver mass.   Other  orders -     Potassium Chloride Crys ER; Take 1 tablet (20 mEq total) by mouth daily. Once daily along with lasix.  Dispense: 90 tablet; Refill: 3     No follow-ups on file.   Eliezer Lofts, MD

## 2022-12-31 NOTE — Assessment & Plan Note (Addendum)
Stable, chronic.    Omeprazole 40 mg daily.

## 2022-12-31 NOTE — Assessment & Plan Note (Addendum)
S/p laminectomy 2022. Dr. Arnoldo Morale Due to arthritis and spinal stenosis. Currently using acetaminophen prn.

## 2022-12-31 NOTE — Assessment & Plan Note (Addendum)
Varicose veins with pain and swelling followed by vascular. Using Lasix 20 mg p.o. daily along with OTC potassium.

## 2022-12-31 NOTE — Assessment & Plan Note (Signed)
Chronic, followed by [facility].  Reviewed recent office visit note from November 12, 2022 On insulin pump and continuous blood sugar meter.

## 2023-01-06 DIAGNOSIS — Z981 Arthrodesis status: Secondary | ICD-10-CM | POA: Diagnosis not present

## 2023-01-06 DIAGNOSIS — M4316 Spondylolisthesis, lumbar region: Secondary | ICD-10-CM | POA: Diagnosis not present

## 2023-01-07 ENCOUNTER — Ambulatory Visit: Payer: Medicare Other

## 2023-01-07 DIAGNOSIS — M5387 Other specified dorsopathies, lumbosacral region: Secondary | ICD-10-CM | POA: Diagnosis not present

## 2023-01-07 DIAGNOSIS — M5136 Other intervertebral disc degeneration, lumbar region: Secondary | ICD-10-CM | POA: Diagnosis not present

## 2023-01-07 DIAGNOSIS — M9901 Segmental and somatic dysfunction of cervical region: Secondary | ICD-10-CM | POA: Diagnosis not present

## 2023-01-07 DIAGNOSIS — M5383 Other specified dorsopathies, cervicothoracic region: Secondary | ICD-10-CM | POA: Diagnosis not present

## 2023-01-07 DIAGNOSIS — M9903 Segmental and somatic dysfunction of lumbar region: Secondary | ICD-10-CM | POA: Diagnosis not present

## 2023-01-07 DIAGNOSIS — M9904 Segmental and somatic dysfunction of sacral region: Secondary | ICD-10-CM | POA: Diagnosis not present

## 2023-01-07 DIAGNOSIS — M9902 Segmental and somatic dysfunction of thoracic region: Secondary | ICD-10-CM | POA: Diagnosis not present

## 2023-01-20 ENCOUNTER — Encounter: Payer: Self-pay | Admitting: Family Medicine

## 2023-01-20 ENCOUNTER — Encounter: Payer: Self-pay | Admitting: Internal Medicine

## 2023-01-20 MED ORDER — LANTUS SOLOSTAR 100 UNIT/ML ~~LOC~~ SOPN: 16.0000 [IU] | PEN_INJECTOR | Freq: Every day | SUBCUTANEOUS | 4 refills | Status: DC

## 2023-01-20 MED ORDER — INSULIN PEN NEEDLE 32G X 4 MM MISC: 1.0000 | Freq: Four times a day (QID) | 3 refills | Status: DC

## 2023-01-20 MED ORDER — PREGABALIN 225 MG PO CAPS: 225.0000 mg | ORAL_CAPSULE | Freq: Every day | ORAL | 1 refills | Status: DC

## 2023-01-20 NOTE — Telephone Encounter (Signed)
Last visit 12/31/2022 No future appoinment with PCP Refilled 06/16/2022

## 2023-01-21 DIAGNOSIS — M9903 Segmental and somatic dysfunction of lumbar region: Secondary | ICD-10-CM | POA: Diagnosis not present

## 2023-01-21 DIAGNOSIS — M9901 Segmental and somatic dysfunction of cervical region: Secondary | ICD-10-CM | POA: Diagnosis not present

## 2023-01-21 DIAGNOSIS — M9902 Segmental and somatic dysfunction of thoracic region: Secondary | ICD-10-CM | POA: Diagnosis not present

## 2023-01-21 DIAGNOSIS — M5136 Other intervertebral disc degeneration, lumbar region: Secondary | ICD-10-CM | POA: Diagnosis not present

## 2023-01-21 DIAGNOSIS — M5387 Other specified dorsopathies, lumbosacral region: Secondary | ICD-10-CM | POA: Diagnosis not present

## 2023-01-21 DIAGNOSIS — M1712 Unilateral primary osteoarthritis, left knee: Secondary | ICD-10-CM | POA: Diagnosis not present

## 2023-01-21 DIAGNOSIS — M9904 Segmental and somatic dysfunction of sacral region: Secondary | ICD-10-CM | POA: Diagnosis not present

## 2023-01-21 DIAGNOSIS — M5383 Other specified dorsopathies, cervicothoracic region: Secondary | ICD-10-CM | POA: Diagnosis not present

## 2023-02-06 DIAGNOSIS — E119 Type 2 diabetes mellitus without complications: Secondary | ICD-10-CM | POA: Diagnosis not present

## 2023-02-06 LAB — HM DIABETES EYE EXAM

## 2023-02-11 ENCOUNTER — Ambulatory Visit (INDEPENDENT_AMBULATORY_CARE_PROVIDER_SITE_OTHER): Payer: Medicare Other

## 2023-02-11 VITALS — Ht 65.0 in | Wt 180.0 lb

## 2023-02-11 DIAGNOSIS — Z0000 Encounter for general adult medical examination without abnormal findings: Secondary | ICD-10-CM | POA: Diagnosis not present

## 2023-02-11 DIAGNOSIS — Z5986 Financial insecurity: Secondary | ICD-10-CM | POA: Diagnosis not present

## 2023-02-11 NOTE — Progress Notes (Signed)
I connected with  Waynard Edwards on 02/11/23 by a audio enabled telemedicine application and verified that I am speaking with the correct person using two identifiers.  Patient Location: Home  Provider Location: Home Office  I discussed the limitations of evaluation and management by telemedicine. The patient expressed understanding and agreed to proceed.  Subjective:   Karla Patton is a 72 y.o. female who presents for Medicare Annual (Subsequent) preventive examination.  Review of Systems     Cardiac Risk Factors include: advanced age (>20mn, >>65women);diabetes mellitus;hypertension;dyslipidemia     Objective:    Today's Vitals   02/11/23 1521  Weight: 180 lb (81.6 kg)  Height: 5' 5"$  (1.651 m)   Body mass index is 29.95 kg/m.     02/11/2023    3:36 PM 03/18/2021    3:30 PM 04/24/2020    9:22 AM 10/20/2018    7:19 AM 05/29/2017    9:25 AM 02/26/2017   10:09 AM  Advanced Directives  Does Patient Have a Medical Advance Directive? Yes No Yes Yes Yes Yes  Type of AParamedicof APalouseLiving will  [place]Living will  [place] GroveLiving will [place]Living will  Copy of HBuchananin Chart? No - copy requested  No - copy requested     Would patient like information on creating a medical advance directive?  No - Patient declined        Current Medications (verified) Outpatient Encounter Medications as of 02/11/2023  Medication Sig   acetaminophen (TYLENOL) 650 MG CR tablet Take 650 mg by mouth every 8 (eight) hours as needed for pain.   APPLE CIDER VINEGAR PO Take 600 mg by mouth daily.    aspirin EC 81 MG tablet Take 81 mg by mouth daily. Takes at bedtime   beta carotene 25000 UNIT capsule Take 25,000 Units by mouth daily.   Cholecalciferol (VITAMIN D3) 250 MCG (10000 UT) capsule Take 10,000 Units by mouth daily.   Continuous Blood Gluc Sensor (DEXCOM G6 SENSOR) MISC  UsE TO CHECK BLOOD SUGAR THROUGHOUT THE DAY REPLACE EVERY 14 DAYS   Ferrous Sulfate Dried (HIGH POTENCY IRON) 65 MG TABS Take 1 tablet by mouth daily.   fluticasone (FLONASE) 50 MCG/ACT nasal spray SPRAY 2 SPRAYS INTO EACH NOSTRIL EVERY DAY   furosemide (LASIX) 20 MG tablet TAKE 1 TABLET BY MOUTH DAILY   glucose blood (ACCU-CHEK GUIDE) test strip 1 each by Other route daily in the afternoon. Use as instructed   insulin aspart (NOVOLOG) 100 UNIT/ML injection Max Daily 50 units per pump   insulin glargine (LANTUS SOLOSTAR) 100 UNIT/ML Solostar Pen Inject 16 Units into the skin daily.   Insulin Pen Needle 32G X 4 MM MISC 1 Device by Does not apply route in the morning, at noon, in the evening, and at bedtime.   levothyroxine (SYNTHROID) 100 MCG tablet Take 1 tablet (100 mcg total) by mouth daily before breakfast.   MAGNESIUM-ZINC PO Take 1 tablet by mouth daily.   Multiple Vitamins-Minerals (ABC PLUS SENIOR ADULTS 50+ PO) Take 1 tablet by mouth daily.   omeprazole (PRILOSEC) 40 MG capsule TAKE 1 CAPSULE(40 MG) BY MOUTH DAILY   potassium chloride SA (KLOR-CON M) 20 MEQ tablet Take 1 tablet (20 mEq total) by mouth daily. Once daily along with lasix.   pregabalin (LYRICA) 225 MG capsule Take 1 capsule (225 mg total) by mouth daily.   rosuvastatin (CRESTOR) 10 MG tablet TAKE  1 TABLET BY MOUTH EVERY DAY   No facility-administered encounter medications on file as of 02/11/2023.    Allergies (verified) Lisinopril   History: Past Medical History:  Diagnosis Date   Allergy 11/2021   Lisinolpril-cough   Anemia    Anxiety    Arthritis    Back pain    Diabetes mellitus without complication (HCC)    type 1   Emphysema of lung (HCC)    GERD (gastroesophageal reflux disease)    Heart murmur    Hypertension associated with diabetes ([place]) 03/03/2018   Hypothyroidism    Thyroid disease    Varicose vein    Past Surgical History:  Procedure Laterality Date   ABDOMINAL HYSTERECTOMY  1994    CHOLECYSTECTOMY  09/07/2013   COLONOSCOPY  2009   normal   COLONOSCOPY WITH PROPOFOL N/A 10/20/2018   Procedure: COLONOSCOPY WITH PROPOFOL;  Surgeon: Robert Bellow, MD;  Location: [facility] ENDOSCOPY;  Service: Endoscopy;  Laterality: N/A;   INCONTINENCE SURGERY  2003   left leg vein ligation  2015   SPINE SURGERY  03-18-2021   VARICOSE VEIN SURGERY  1990's   Dr Bary Castilla   VEIN SURGERY Left 11/17/2017   vein closure procedure/Dr Jamal Collin   Family History  Problem Relation Age of Onset   Diabetes Mother    Pancreatitis Mother    Heart disease Father    Stroke Father        x 2   Hypertension Father    Varicose Veins Father    Fibromyalgia Sister    Scoliosis Sister    Arthritis Sister    Diabetes Sister    Breast cancer Neg Hx    Social History   Socioeconomic History   Marital status: Soil scientist    Spouse name: Not on file   Number of children: 2   Years of education: 12   Highest education level: High school graduate  Occupational History   Occupation: Retired  Tobacco Use   Smoking status: Never   Smokeless tobacco: Never  Vaping Use   Vaping Use: Never used  Substance and Sexual Activity   Alcohol use: Yes    Alcohol/week: 9.0 standard drinks of alcohol    Types: 7 Glasses of wine, 2 Shots of liquor per week    Comment: vodka 1 drink nightly  and/ or wine   Drug use: No   Sexual activity: Not Currently    Partners: Male    Birth control/protection: Surgical, None  Other Topics Concern   Not on file  Social History Narrative   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   2 children Sons (Jones)   [place] 3 blood related   5 step-grandchildren    Enjoys going to [employer] and reading   No pets    Social Determinants of Health   Financial Resource Strain: Medium Risk (02/11/2023)   Overall Financial Resource Strain (CARDIA)    Difficulty of Paying Living Expenses: Somewhat hard  Food Insecurity: No Food Insecurity (02/11/2023)   Hunger  Vital Sign    Worried About Running Out of Food in the Last Year: Never true    Ran Out of Food in the Last Year: Never true  Transportation Needs: No Transportation Needs (02/11/2023)   PRAPARE - Hydrologist (Medical): No    Lack of Transportation (Non-Medical): No  Physical Activity: Sufficiently Active (02/11/2023)   Exercise Vital Sign    Days of Exercise per Week: 7  days    Minutes of Exercise per Session: 60 min  Stress: No Stress Concern Present (02/11/2023)   [place]    Feeling of Stress : Not at all  Social Connections: Moderately Isolated (02/11/2023)   Social Connection and Isolation Panel [NHANES]    Frequency of Communication with Friends and Family: More than three times a week    Frequency of Social Gatherings with Friends and Family: More than three times a week    Attends Religious Services: Never    Marine scientist or Organizations: No    Attends Music therapist: Never    Marital Status: Living with partner    Tobacco Counseling Counseling given: Not Answered   Clinical Intake:  Pre-visit preparation completed: Yes  Pain : No/denies pain     Nutritional Risks: None Diabetes: Yes CBG done?: No Did pt. bring in CBG monitor from home?: No  How often do you need to have someone help you when you read instructions, pamphlets, or other written materials from your doctor or pharmacy?: 1 - Never  Diabetic?Nutrition Risk Assessment:  Has the patient had any N/V/D within the last 2 months?  No  Does the patient have any non-healing wounds?  No  Has the patient had any unintentional weight loss or weight gain?  No   Diabetes:  Is the patient diabetic?  Yes  If diabetic, was a CBG obtained today?  Yes  pt 203 Did the patient bring in their glucometer from home?  No  How often do you monitor your CBG's? Continuous monitor.   Financial Strains  and Diabetes Management:  Are you having any financial strains with the device, your supplies or your medication? Yes . Community referral placed. Does the patient want to be seen by Chronic Care Management for management of their diabetes?  No  Would the patient like to be referred to a Nutritionist or for Diabetic Management?  No   Diabetic Exams:  Diabetic Eye Exam: Completed 01/03/2022 [facility] Diabetic Foot Exam: Completed 12/31/2022 PCP    Interpreter Needed?: No  Information entered by :: C.Esta Carmon LPN   Activities of Daily Living    02/11/2023    3:38 PM  In your present state of health, do you have any difficulty performing the following activities:  Hearing? 0  Vision? 0  Difficulty concentrating or making decisions? 0  Walking or climbing stairs? 0  Dressing or bathing? 0  Doing errands, shopping? 0  Preparing Food and eating ? N  Using the Toilet? N  In the past six months, have you accidently leaked urine? Y  Comment Weard pads has had bladder tacked.  Do you have problems with loss of bowel control? N  Managing your Medications? N  Managing your Finances? N  Housekeeping or managing your Housekeeping? N    Patient Care Team: Jinny Sanders, MD as PCP - General (Family Medicine) Dasher, Rayvon Char, MD (Dermatology) Bary Castilla Forest Gleason, MD as Consulting Physician (General Surgery) [facility], Melanie Crazier, MD as Attending Physician (Endocrinology)  Indicate any recent Medical Services you may have received from other than [facility] providers in the past year (date may be approximate).     Assessment:   This is a routine wellness examination for Anjalina.  Hearing/Vision screen Hearing Screening - Comments:: No aids Vision Screening - Comments:: Wears readers - [facility]  Dietary issues and exercise activities discussed: Current Exercise Habits: Home exercise routine, Type  of exercise: walking, Time (Minutes): 60, Frequency (Times/Week): 7, Weekly  Exercise (Minutes/Week): 420, Intensity: Mild   Goals Addressed             This Visit's Progress    Patient Stated       Get back on insulin pump and lose 20lbs.       Depression Screen    02/11/2023    3:35 PM 08/28/2022   11:12 AM 05/27/2022   11:13 AM 01/13/2022   10:30 AM 02/21/2021    2:00 PM 01/28/2021   10:45 AM 10/19/2020    3:11 PM  PHQ 2/9 Scores  PHQ - 2 Score 0 0 1 0 0 0 0  PHQ- 9 Score  4 2 0  1 3    Fall Risk    02/11/2023    3:38 PM 08/28/2022   10:12 AM 01/13/2022   10:30 AM 09/06/2021    2:33 PM 05/21/2021    9:18 AM  Fall Risk   Falls in the past year? 0 1 0 1   Comment     Reports accidental fall within the last week.  Number falls in past yr: 0 0 0 1 0  Injury with Fall? 0 1 1 1 $ 0  Risk for fall due to : No Fall Risks Medication side effect  History of fall(s);Medication side effect;Impaired mobility;Orthopedic patient History of fall(s);Medication side effect;Impaired balance/gait  Follow up Falls prevention discussed;Falls evaluation completed   Falls prevention discussed Falls evaluation completed    FALL RISK PREVENTION PERTAINING TO THE HOME:  Any stairs in or around the home? Yes  If so, are there any without handrails? Yes back steps have no rails. Home free of loose throw rugs in walkways, pet beds, electrical cords, etc? Yes  Adequate lighting in your home to reduce risk of falls? Yes   ASSISTIVE DEVICES UTILIZED TO PREVENT FALLS:  Life alert? No  Use of a cane, walker or w/c? No  Grab bars in the bathroom? Yes  Shower chair or bench in shower? Yes  Elevated toilet seat or a handicapped toilet? Yes   Cognitive Function:        02/11/2023    3:48 PM 04/24/2020    9:28 AM  6CIT Screen  What Year? 0 points 0 points  What month? 0 points 0 points  What time? 0 points 0 points  Count back from 20 0 points 0 points  Months in reverse 0 points 0 points  Repeat phrase 0 points 0 points  Total Score 0 points 0 points     Immunizations Immunization History  Administered Date(s) Administered   Fluad Quad(high Dose 65+) 08/28/2022   Influenza Split 09/02/2014   Influenza, High Dose Seasonal PF 09/01/2018   Influenza,inj,Quad PF,6+ Mos 08/28/2017   Influenza-Unspecified 10/17/2016, 09/21/2019, 09/11/2020, 09/15/2021   PFIZER(Purple Top)SARS-COV-2 Vaccination 02/01/2020, 02/22/2020, 09/18/2020, 09/23/2021   Pneumococcal Conjugate-13 02/01/2014   Pneumococcal Polysaccharide-23 09/02/2014, 10/19/2019   Tdap 11/26/2016   Zoster Recombinat (Shingrix) 03/10/2022, 05/18/2022   Zoster, Live 02/28/2014    TDAP status: Up to date  Flu Vaccine status: Up to date  Pneumococcal vaccine status: Up to date  Covid-19 vaccine status: Information provided on how to obtain vaccines.   Qualifies for Shingles Vaccine? No   Zostavax completed No   Shingrix Completed?: Yes  Screening Tests Health Maintenance  Topic Date Due   COVID-19 Vaccine (5 - 2023-24 season) 08/22/2022   OPHTHALMOLOGY EXAM  01/03/2023   HEMOGLOBIN A1C  05/13/2023  MAMMOGRAM  07/24/2023   Diabetic kidney evaluation - eGFR measurement  09/04/2023   Diabetic kidney evaluation - Urine ACR  11/13/2023   FOOT EXAM  01/01/2024   Medicare Annual Wellness (AWV)  02/12/2024   DTaP/Tdap/Td (2 - Td or Tdap) 11/26/2026   DEXA SCAN  07/24/2027   COLONOSCOPY (Pts 45-45yr Insurance coverage will need to be confirmed)  10/20/2028   Pneumonia Vaccine 72 Years old  Completed   INFLUENZA VACCINE  Completed   Hepatitis C Screening  Completed   Zoster Vaccines- Shingrix  Completed   HPV VACCINES  Aged Out    Health Maintenance  Health Maintenance Due  Topic Date Due   COVID-19 Vaccine (5 - 2023-24 season) 08/22/2022   OPHTHALMOLOGY EXAM  01/03/2023    Colorectal cancer screening: Type of screening: Colonoscopy. Completed 10/20/2018. Repeat every 10 years  Mammogram status: Completed 07/23/2022. Repeat every year  Bone Density status:  Completed 07/23/2022. Results reflect: Bone density results: OSTEOPOROSIS. Repeat every 2 years.  Lung Cancer Screening: (Low Dose CT Chest recommended if Age 71-80years, 30 pack-year currently smoking OR have quit w/in 15years.) does not qualify.   Lung Cancer Screening Referral: no  Additional Screening:  Hepatitis C Screening: does not qualify; Completed 09/08/2017  Vision Screening: Recommended annual ophthalmology exams for early detection of glaucoma and other disorders of the eye. Is the patient up to date with their annual eye exam?  Yes  Who is the provider or what is the name of the office in which the patient attends annual eye exams[place] is not established with a provider, would they like to be referred to a provider to establish care? No .   Dental Screening: Recommended annual dental exams for proper oral hygiene  Community Resource Referral / Chronic Care Management: CRR required this visit?  Yes   CCM required this visit?  No      Plan:     I have personally reviewed and noted the following in the patient's chart:   Medical and social history Use of alcohol, tobacco or illicit drugs  Current medications and supplements including opioid prescriptions. Patient is not currently taking opioid prescriptions. Functional ability and status Nutritional status Physical activity Advanced directives List of other physicians Hospitalizations, surgeries, and ER visits in previous 12 months Vitals Screenings to include cognitive, depression, and falls Referrals and appointments  In addition, I have reviewed and discussed with patient certain preventive protocols, quality metrics, and best practice recommendations. A written personalized care plan for preventive services as well as general preventive health recommendations were provided to patient.     CLebron Conners LPN   [id number]  Nurse Notes: Community referral placed for assistance with  financial insecurity to see if any assistance available obtaining diabetic supplies.

## 2023-02-11 NOTE — Patient Instructions (Signed)
Karla Patton , Thank you for taking time to come for your Medicare Wellness Visit. I appreciate your ongoing commitment to your health goals. Please review the following plan we discussed and let me know if I can assist you in the future.   These are the goals we discussed:  Goals      DIET - INCREASE WATER INTAKE     Recommend to drink at least 6-8 8oz glasses of water per day.     Monitor and Manage My Blood Sugar-Diabetes Type 1     Timeframe:  Long-Range Goal Priority:  High Start Date: 02/26/2021                            Expected End Date: 08/29/2022                      Follow Up within 90 days    - check blood sugar at prescribed times - check blood sugar if I feel it is too high or too low - take the blood sugar meter to all doctor visits    Why is this important?   Checking your blood sugar at home helps to keep it from getting very high or very low.  Writing the results in a diary or log helps the doctor know how to care for you.  Your blood sugar log should have the time, the date and the results.  Also, write down the amount of insulin or other medicine you take.  Other information like what you ate, exercise done and how you were feeling will also be helpful..     Notes:      Patient Stated     Get back on insulin pump and lose 20lbs.        This is a list of the screening recommended for you and due dates:  Health Maintenance  Topic Date Due   COVID-19 Vaccine (5 - 2023-24 season) 08/22/2022   Eye exam for diabetics  01/03/2023   Hemoglobin A1C  05/13/2023   Mammogram  07/24/2023   Yearly kidney function blood test for diabetes  09/04/2023   Yearly kidney health urinalysis for diabetes  11/13/2023   Complete foot exam   01/01/2024   Medicare Annual Wellness Visit  02/12/2024   DTaP/Tdap/Td vaccine (2 - Td or Tdap) 11/26/2026   DEXA scan (bone density measurement)  07/24/2027   Colon Cancer Screening  10/20/2028   Pneumonia Vaccine  Completed   Flu Shot   Completed   Hepatitis C Screening: USPSTF Recommendation to screen - Ages 83-79 yo.  Completed   Zoster (Shingles) Vaccine  Completed   HPV Vaccine  Aged Out    Advanced directives: Please bring a copy of your health care power of attorney and living will to the office to be added to your chart at your convenience.   Next appointment: Follow up in one year for your annual wellness visit 02/18/2024 @ 2pm via telephone   Preventive Care 65 Years and Older, Female Preventive care refers to lifestyle choices and visits with your health care provider that can promote health and wellness. What does preventive care include? A yearly physical exam. This is also called an annual well check. Dental exams once or twice a year. Routine eye exams. Ask your health care provider how often you should have your eyes checked. Personal lifestyle choices, including: Daily care of your teeth and gums. Regular physical  activity. Eating a healthy diet. Avoiding tobacco and drug use. Limiting alcohol use. Practicing safe sex. Taking low-dose aspirin every day. Taking vitamin and mineral supplements as recommended by your health care provider. What happens during an annual well check? The services and screenings done by your health care provider during your annual well check will depend on your age, overall health, lifestyle risk factors, and family history of disease. Counseling  Your health care provider may ask you questions about your: Alcohol use. Tobacco use. Drug use. Emotional well-being. Home and relationship well-being. Sexual activity. Eating habits. History of falls. Memory and ability to understand (cognition). Work and work Statistician. Reproductive health. Screening  You may have the following tests or measurements: Height, weight, and BMI. Blood pressure. Lipid and cholesterol levels. These may be checked every 5 years, or more frequently if you are over 21 years old. Skin  check. Lung cancer screening. You may have this screening every year starting at age 80 if you have a 30-pack-year history of smoking and currently smoke or have quit within the past 15 years. Fecal occult blood test (FOBT) of the stool. You may have this test every year starting at age 10. Flexible sigmoidoscopy or colonoscopy. You may have a sigmoidoscopy every 5 years or a colonoscopy every 10 years starting at age 19. Hepatitis C blood test. Hepatitis B blood test. Sexually transmitted disease (STD) testing. Diabetes screening. This is done by checking your blood sugar (glucose) after you have not eaten for a while (fasting). You may have this done every 1-3 years. Bone density scan. This is done to screen for osteoporosis. You may have this done starting at age 77. Mammogram. This may be done every 1-2 years. Talk to your health care provider about how often you should have regular mammograms. Talk with your health care provider about your test results, treatment options, and if necessary, the need for more tests. Vaccines  Your health care provider may recommend certain vaccines, such as: Influenza vaccine. This is recommended every year. Tetanus, diphtheria, and acellular pertussis (Tdap, Td) vaccine. You may need a Td booster every 10 years. Zoster vaccine. You may need this after age 40. Pneumococcal 13-valent conjugate (PCV13) vaccine. One dose is recommended after age 77. Pneumococcal polysaccharide (PPSV23) vaccine. One dose is recommended after age 18. Talk to your health care provider about which screenings and vaccines you need and how often you need them. This information is not intended to replace advice given to you by your health care provider. Make sure you discuss any questions you have with your health care provider. Document Released: 01/04/2016 Document Revised: 08/27/2016 Document Reviewed: 10/09/2015 Elsevier Interactive Patient Education  2017 [place]  Prevention in the Home Falls can cause injuries. They can happen to people of all ages. There are many things you can do to make your home safe and to help prevent falls. What can I do on the outside of my home? Regularly fix the edges of walkways and driveways and fix any cracks. Remove anything that might make you trip as you walk through a door, such as a raised step or threshold. Trim any bushes or trees on the path to your home. Use bright outdoor lighting. Clear any walking paths of anything that might make someone trip, such as rocks or tools. Regularly check to see if handrails are loose or broken. Make sure that both sides of any steps have handrails. Any raised decks and porches should have guardrails on  the edges. Have any leaves, snow, or ice cleared regularly. Use sand or salt on walking paths during winter. Clean up any spills in your garage right away. This includes oil or grease spills. What can I do in the bathroom? Use night lights. Install grab bars by the toilet and in the tub and shower. Do not use towel bars as grab bars. Use non-skid mats or decals in the tub or shower. If you need to sit down in the shower, use a plastic, non-slip stool. Keep the floor dry. Clean up any water that spills on the floor as soon as it happens. Remove soap buildup in the tub or shower regularly. Attach bath mats securely with double-sided non-slip rug tape. Do not have throw rugs and other things on the floor that can make you trip. What can I do in the bedroom? Use night lights. Make sure that you have a light by your bed that is easy to reach. Do not use any sheets or blankets that are too big for your bed. They should not hang down onto the floor. Have a firm chair that has side arms. You can use this for support while you get dressed. Do not have throw rugs and other things on the floor that can make you trip. What can I do in the kitchen? Clean up any spills right away. Avoid  walking on wet floors. Keep items that you use a lot in easy-to-reach places. If you need to reach something above you, use a strong step stool that has a grab bar. Keep electrical cords out of the way. Do not use floor polish or wax that makes floors slippery. If you must use wax, use non-skid floor wax. Do not have throw rugs and other things on the floor that can make you trip. What can I do with my stairs? Do not leave any items on the stairs. Make sure that there are handrails on both sides of the stairs and use them. Fix handrails that are broken or loose. Make sure that handrails are as long as the stairways. Check any carpeting to make sure that it is firmly attached to the stairs. Fix any carpet that is loose or worn. Avoid having throw rugs at the top or bottom of the stairs. If you do have throw rugs, attach them to the floor with carpet tape. Make sure that you have a light switch at the top of the stairs and the bottom of the stairs. If you do not have them, ask someone to add them for you. What else can I do to help prevent falls? Wear shoes that: Do not have high heels. Have rubber bottoms. Are comfortable and fit you well. Are closed at the toe. Do not wear sandals. If you use a stepladder: Make sure that it is fully opened. Do not climb a closed stepladder. Make sure that both sides of the stepladder are locked into place. Ask someone to hold it for you, if possible. Clearly mark and make sure that you can see: Any grab bars or handrails. First and last steps. Where the edge of each step is. Use tools that help you move around (mobility aids) if they are needed. These include: Canes. Walkers. Scooters. Crutches. Turn on the lights when you go into a dark area. Replace any light bulbs as soon as they burn out. Set up your furniture so you have a clear path. Avoid moving your furniture around. If any of your floors are uneven,  fix them. If there are any pets around  you, be aware of where they are. Review your medicines with your doctor. Some medicines can make you feel dizzy. This can increase your chance of falling. Ask your doctor what other things that you can do to help prevent falls. This information is not intended to replace advice given to you by your health care provider. Make sure you discuss any questions you have with your health care provider. Document Released: 10/04/2009 Document Revised: 05/15/2016 Document Reviewed: 01/12/2015 Elsevier Interactive Patient Education  2017 [employer].

## 2023-02-12 ENCOUNTER — Telehealth: Payer: Self-pay

## 2023-02-12 NOTE — Telephone Encounter (Signed)
   Telephone encounter was:  Unsuccessful.  02/12/2023 Name: Chaniah Tokash MRN: [id number] DOB: May 20, 1951  Unsuccessful outbound call made today to assist with:   RX  Outreach Attempt:  2nd Attempt  A HIPAA compliant voice message was left requesting a return call.  Instructed patient to call back    [facility] [phone] [address], [place], [place] [address] Phone: [phone] Email: [email or web address]

## 2023-02-12 NOTE — Telephone Encounter (Signed)
   Telephone encounter was:  Successful.  02/12/2023 Name: Karla Patton MRN: [id number] DOB: 09-30-1951  Karla Patton is a 72 y.o. year old female who is a primary care patient of Bedsole, Amy E, MD . The community resource team was consulted for assistance with  Rx  Care guide performed the following interventions: Patient provided with information about care guide support team and interviewed to confirm resource needs.Patient stated her insulin cost $284 and she is having financial strain paying for all medications I have sent a message for assistance with Pharmacy needs  Follow Up Plan:  No further follow up planned at this time. The patient has been provided with needed resources.    [facility] [phone] [address], [place], [place] [address] Phone: [phone] Email: [email or web address]

## 2023-02-19 ENCOUNTER — Encounter: Payer: Self-pay | Admitting: Internal Medicine

## 2023-02-20 ENCOUNTER — Telehealth: Payer: Self-pay

## 2023-02-20 DIAGNOSIS — Z596 Low income: Secondary | ICD-10-CM

## 2023-02-20 NOTE — Progress Notes (Signed)
[facility] Care Management  [place]   02/20/2023  Jewelia Moros 02-Dec-1951 [id number]  Reason for referral: Medication assistance for Insulins   Referral source: Larena Sox, [place] Guide  Referral medication(s): Insulins (per referral) Current insurance:[employer]   [place] team received a referral for medication assistance with insulins. Per discussion with patient, she was previously on receiving insulin via OmniPod pump. However, when she switched insurances the cost of Omni pump significantly increased and patient is no longer able to afford the device. She reports recently transitioning to insulin pens and does not feel her blood glucose is adequately controlled now. Overall, her main focus was to receive assistance for the pump and not insulin. She denies cost issues with affording Novolog, Lantus, or the [employer]. However, she admits that Lantus cost $90.62 for 88 days supply and $75 for 90 days supply of Novolog. She has plenty of insulin on hand currently.   This pharmacist called the manufacturer of Omni Pump/Pods to reach the medication assistance representative with the patient on the phone. Unfortunately, the patient was unresponsive on the phone after multiple attempts to get her attention, not sure if this was related to a connectivity issue, and I was unable to obtain the income criteria without the patient on the phone.   During the telephone discussion, I was unable to verify whether patient qualified for PAP for Novolog or Lantus.   Assessment:  Medication Review Findings:  Obtain eligibility information for Omini Pump/Pod, Lantus (or comparable insulin), and Novolog (or comparable insulin)   Plan: I will follow-up with patient in 3-5 business days to try to contact manufacturer again and gather more medication assistance information.   Thank you for allowing pharmacy to be a part of this patient's  care. Kristeen Miss, PharmD Clinical Pharmacist [place] Cell: [phone]

## 2023-02-26 ENCOUNTER — Telehealth: Payer: Self-pay

## 2023-02-26 NOTE — Progress Notes (Signed)
[facility]) [place]   02/26/2023  Harjot Govea 18-Sep-1951 [id number]  Reason for referral: Medication Assistance  Referral source:  [place]  Current insurance: [employer]na - per patient report during prior encounter   Reason for call: Medication Assistance for Omni Pump/Insulins   Outreach:  Unsuccessful telephone call attempt #1 to patient.   HIPAA compliant voicemail left requesting a return call  Plan:  -I will make another outreach attempt to patient in 5-7 business days.  Thank you for allowing [facility] pharmacy to be a part of this patient's care.  Kristeen Miss, PharmD Clinical Pharmacist [place] Cell: [phone]

## 2023-03-05 ENCOUNTER — Telehealth: Payer: Self-pay

## 2023-03-05 NOTE — Progress Notes (Signed)
[facility])  [place] Team   03/05/2023  Ayliah Flick 1951-06-26 [id number]  Reason for referral: Medication assistance with Lantus and Novolog   Referral source:  [place] Management  Current insurance: [employer] Falling  Outreach:  Successful telephone call with Ms. Brzozowski.  HIPAA identifiers verified.   Medication Review Findings:  Lantus and Novolog are both eligible for medication assistance given reported income is < 400% FPL. Patient informed medications could be shipped to her home or MD office.  She does not qualify for financial assistance for the Omni Pod/Pump as she has [employer], per [place] administrator with Omni Pump. Ms. Postal was limited with time and we were unable to contact Aetna/Medicare part B on three-way conference call. Discussed with patient to call [employer] for preferred insulin pump or we could contact them together for information on preferred pump. Per patient, OmniPod [facility] and Dexcom was billed through part B. I called [facility] to confirm billing for OmniPod.  Given patient's income, she does not qualify for LIS.   Medication Assistance Findings:  Medication assistance needs identified: Lantus and Novolog   Extra Help:  Not eligible for Extra Help Low Income Subsidy based on reported income and assets  Additional medication assistance options reviewed with patient as warranted:  No other options identified  Plan: I will route patient assistance letter to [place] technician who will coordinate patient assistance program application process for medications listed above.  [facility] pharmacy technician will assist with obtaining all required documents from both patient and provider(s) and submit application(s) once completed. Will contact Ms. Pech in 5-7 business days regarding telephone call with [place] Falling to determine preferred insulin pump.  Thank you for allowing [facility]  pharmacy to be a part of this patient's care.   Kristeen Miss, PharmD Clinical Pharmacist [place] Cell: [phone]

## 2023-03-11 ENCOUNTER — Telehealth: Payer: Self-pay

## 2023-03-11 NOTE — Telephone Encounter (Signed)
Clinical notes faxed to [facility] at [phone] per fax request

## 2023-03-12 ENCOUNTER — Encounter: Payer: Self-pay | Admitting: Family Medicine

## 2023-03-12 ENCOUNTER — Ambulatory Visit (INDEPENDENT_AMBULATORY_CARE_PROVIDER_SITE_OTHER): Payer: Medicare Other | Admitting: Family Medicine

## 2023-03-12 VITALS — BP 134/66 | HR 71 | Temp 97.4°F | Ht 65.0 in | Wt 183.2 lb

## 2023-03-12 DIAGNOSIS — R011 Cardiac murmur, unspecified: Secondary | ICD-10-CM | POA: Diagnosis not present

## 2023-03-12 DIAGNOSIS — R0683 Snoring: Secondary | ICD-10-CM | POA: Diagnosis not present

## 2023-03-12 DIAGNOSIS — R5383 Other fatigue: Secondary | ICD-10-CM

## 2023-03-12 LAB — COMPREHENSIVE METABOLIC PANEL
ALT: 23 U/L (ref 0–35)
AST: 30 U/L (ref 0–37)
Albumin: 4.4 g/dL (ref 3.5–5.2)
Alkaline Phosphatase: 81 U/L (ref 39–117)
BUN: 10 mg/dL (ref 6–23)
CO2: 34 mEq/L — ABNORMAL HIGH (ref 19–32)
Calcium: 9.6 mg/dL (ref 8.4–10.5)
Chloride: 95 mEq/L — ABNORMAL LOW (ref 96–112)
Creatinine, Ser: 0.64 mg/dL (ref 0.40–1.20)
GFR: 89.54 mL/min (ref >60.00)
Glucose, Bld: 53 mg/dL — ABNORMAL LOW (ref 70–99)
Potassium: 4.1 mEq/L (ref 3.5–5.1)
Sodium: 134 mEq/L — ABNORMAL LOW (ref 135–145)
Total Bilirubin: 0.5 mg/dL (ref 0.2–1.2)
Total Protein: 7.7 g/dL (ref 6.0–8.3)

## 2023-03-12 LAB — CBC WITH DIFFERENTIAL/PLATELET
Basophils Absolute: 0.1 10*3/uL (ref 0.0–0.1)
Basophils Relative: 2.1 % (ref 0.0–3.0)
Eosinophils Absolute: 0.2 10*3/uL (ref 0.0–0.7)
Eosinophils Relative: 2.9 % (ref 0.0–5.0)
HCT: 35.2 % — ABNORMAL LOW (ref 36.0–46.0)
Hemoglobin: 11.6 g/dL — ABNORMAL LOW (ref 12.0–15.0)
Lymphocytes Relative: 39.8 % (ref 12.0–46.0)
Lymphs Abs: 2.1 10*3/uL (ref 0.7–4.0)
MCHC: 32.9 g/dL (ref 30.0–36.0)
MCV: 83.5 fl (ref 78.0–100.0)
Monocytes Absolute: 0.7 10*3/uL (ref 0.1–1.0)
Monocytes Relative: 12.7 % — ABNORMAL HIGH (ref 3.0–12.0)
Neutro Abs: 2.2 10*3/uL (ref 1.4–7.7)
Neutrophils Relative %: 42.5 % — ABNORMAL LOW (ref 43.0–77.0)
Platelets: 432 10*3/uL — ABNORMAL HIGH (ref 150.0–400.0)
RBC: 4.22 Mil/uL (ref 3.87–5.11)
RDW: 15.4 % (ref 11.5–15.5)
WBC: 5.3 10*3/uL (ref 4.0–10.5)

## 2023-03-12 LAB — TSH: TSH: 0.97 u[IU]/mL (ref 0.35–5.50)

## 2023-03-12 LAB — VITAMIN B12: Vitamin B-12: 1300 pg/mL — ABNORMAL HIGH (ref 211–911)

## 2023-03-12 MED ORDER — PREGABALIN 100 MG PO CAPS: 100.0000 mg | ORAL_CAPSULE | Freq: Two times a day (BID) | ORAL | 0 refills | Status: DC

## 2023-03-12 NOTE — Assessment & Plan Note (Signed)
Acute.  Recent echo.  Appears stable

## 2023-03-12 NOTE — Assessment & Plan Note (Addendum)
Acute ongoing for the last 2 months.  Patient reports this is more falling asleep unexpectedly as opposed to really feeling tired overall.  She does believe she snores at night and has no one to observe her to determine if she is having any apneic spells.  Will evaluate with labs to make sure no poorly controlled thyroid issue on levothyroxine, no new liver or kidney issue, no electrolyte imbalance.  She denies blood sugar drops and has continuous blood sugar monitor She has history of anemia, will reevaluate.  She is on multiple sedating medications including Lyrica 225 mg in the morning that she has been on for years.  She states most of her pain from her neuropathy is at bedtime so we will switch this to 100 mg twice daily as opposed to all at once in the morning. She is also recently started tramadol but this is only been in the last 2 weeks and she does not feel like this is sedating

## 2023-03-12 NOTE — Patient Instructions (Addendum)
Decrease lyrica to  100 mg twice daily instead in AM at higher dose  Please stop at the lab to have labs drawn. Referral to pulmonary for sleep apnea evaluation.

## 2023-03-12 NOTE — Progress Notes (Signed)
Patient ID: Karla Patton, female    DOB: [birth year], 72 y.o.   MRN: [id number]  This visit was conducted in person.  BP 134/66   Pulse 71   Temp (!) 97.4 F (36.3 C) (Temporal)   Ht 5\' 5"  (1.651 m)   Wt 183 lb 4 oz (83.1 kg)   SpO2 97%   BMI 30.49 kg/m    CC:  Chief Complaint  Patient presents with   Fatigue     C/o falling asleep while driving. Started about 2 mos ago.    Subjective:   HPI: Karla Patton is a 72 y.o. female history of Hypothyroid presenting on 03/12/2023 for Fatigue ( C/o falling asleep while driving. Started about 2 mos ago.)  She reports that in last 2 months she has been falling asleep when driving.  She is also falling asleep when using  computer or on cough watching TV.  Overall energy is pretty good.  She is sleeping well.[name] 7-9 hr a night, using bathroom once nightly.  She does snore, but sleeps alone. Never been tested for sleep apnea.  No chest pain, no dizziness associated. One episode of chest pain  not related to sleepiness. Started back on omeprazole.   No proceeding changes...  New med started tramadol in last 14 days.. using tramadol 50 mg 1-2 times daily for OA knee pain.  Also on lyrica for years 225 mg    Using compression hose for sewlling.. more when on feet.   On levo 100 mcg Lab Results  Component Value Date   TSH 1.56 11/12/2022   Patient has murmur and reviewed echocardiogram from 2023.     Relevant past medical, surgical, family and social history reviewed and updated as indicated. Interim medical history since our last visit reviewed. Allergies and medications reviewed and updated. Outpatient Medications Prior to Visit  Medication Sig Dispense Refill   acetaminophen (TYLENOL) 650 MG CR tablet Take 650 mg by mouth every 8 (eight) hours as needed for pain.     APPLE CIDER VINEGAR PO Take 600 mg by mouth daily.      aspirin EC 81 MG tablet Take 81 mg by mouth daily. Takes at bedtime     beta carotene 25000  UNIT capsule Take 25,000 Units by mouth daily.     Cholecalciferol (VITAMIN D3) 250 MCG (10000 UT) capsule Take 10,000 Units by mouth daily.     Continuous Blood Gluc Sensor (DEXCOM G6 SENSOR) MISC UsE TO CHECK BLOOD SUGAR THROUGHOUT THE DAY REPLACE EVERY 14 DAYS 9 each 5   Ferrous Sulfate Dried (HIGH POTENCY IRON) 65 MG TABS Take 1 tablet by mouth daily.     fluticasone (FLONASE) 50 MCG/ACT nasal spray SPRAY 2 SPRAYS INTO EACH NOSTRIL EVERY DAY 48 mL 1   furosemide (LASIX) 20 MG tablet TAKE 1 TABLET BY MOUTH DAILY 90 tablet 3   glucose blood (ACCU-CHEK GUIDE) test strip 1 each by Other route daily in the afternoon. Use as instructed 100 each 3   insulin aspart (NOVOLOG) 100 UNIT/ML injection Max Daily 50 units per pump 50 mL 11   insulin glargine (LANTUS SOLOSTAR) 100 UNIT/ML Solostar Pen Inject 16 Units into the skin daily. 15 mL 4   Insulin Pen Needle 32G X 4 MM MISC 1 Device by Does not apply route in the morning, at noon, in the evening, and at bedtime. 400 each 3   levothyroxine (SYNTHROID) 100 MCG tablet Take 1 tablet (100 mcg total) by mouth  daily before breakfast. 90 tablet 3   MAGNESIUM-ZINC PO Take 1 tablet by mouth daily.     Multiple Vitamins-Minerals (ABC PLUS SENIOR ADULTS 50+ PO) Take 1 tablet by mouth daily.     omeprazole (PRILOSEC OTC) 20 MG tablet Take 20 mg by mouth daily.     potassium chloride SA (KLOR-CON M) 20 MEQ tablet Take 1 tablet (20 mEq total) by mouth daily. Once daily along with lasix. 90 tablet 3   rosuvastatin (CRESTOR) 10 MG tablet TAKE 1 TABLET BY MOUTH EVERY DAY 90 tablet 1   traMADol (ULTRAM) 50 MG tablet Take 50 mg by mouth every 6 (six) hours as needed.     pregabalin (LYRICA) 225 MG capsule Take 1 capsule (225 mg total) by mouth daily. 90 capsule 1   omeprazole (PRILOSEC) 40 MG capsule TAKE 1 CAPSULE(40 MG) BY MOUTH DAILY 90 capsule 1   No facility-administered medications prior to visit.     Per HPI unless specifically indicated in ROS section  below Review of Systems  Constitutional:  Negative for fatigue and fever.  HENT:  Negative for congestion.   Eyes:  Negative for pain.  Respiratory:  Negative for cough and shortness of breath.   Cardiovascular:  Negative for chest pain, palpitations and leg swelling.  Gastrointestinal:  Negative for abdominal pain.  Genitourinary:  Negative for dysuria and vaginal bleeding.  Musculoskeletal:  Negative for back pain.  Neurological:  Negative for syncope, light-headedness and headaches.  Psychiatric/Behavioral:  Negative for dysphoric mood.    Objective:  BP 134/66   Pulse 71   Temp (!) 97.4 F (36.3 C) (Temporal)   Ht 5\' 5"  (1.651 m)   Wt 183 lb 4 oz (83.1 kg)   SpO2 97%   BMI 30.49 kg/m   Wt Readings from Last 3 Encounters:  03/12/23 183 lb 4 oz (83.1 kg)  02/11/23 180 lb (81.6 kg)  12/31/22 180 lb 8 oz (81.9 kg)      Physical Exam Constitutional:      General: She is not in acute distress.    Appearance: Normal appearance. She is well-developed. She is not ill-appearing or toxic-appearing.  HENT:     Head: Normocephalic.     Right Ear: Hearing, tympanic membrane, ear canal and external ear normal. Tympanic membrane is not erythematous, retracted or bulging.     Left Ear: Hearing, tympanic membrane, ear canal and external ear normal. Tympanic membrane is not erythematous, retracted or bulging.     Nose: No mucosal edema or rhinorrhea.     Right Sinus: No maxillary sinus tenderness or frontal sinus tenderness.     Left Sinus: No maxillary sinus tenderness or frontal sinus tenderness.     Mouth/Throat:     Pharynx: Uvula midline.  Eyes:     General: Lids are normal. Lids are everted, no foreign bodies appreciated.     Conjunctiva/sclera: Conjunctivae normal.     Pupils: Pupils are equal, round, and reactive to light.  Neck:     Thyroid: No thyroid mass or thyromegaly.     Vascular: No carotid bruit.     Trachea: Trachea normal.  Cardiovascular:     Rate and Rhythm:  Normal rate and regular rhythm.     Pulses: Normal pulses.     Heart sounds: S1 normal and S2 normal. Murmur heard.     Systolic murmur is present with a grade of 2/6.     No friction rub. No gallop.  Pulmonary:  Effort: Pulmonary effort is normal. No tachypnea or respiratory distress.     Breath sounds: Normal breath sounds. No decreased breath sounds, wheezing, rhonchi or rales.  Abdominal:     General: Bowel sounds are normal.     Palpations: Abdomen is soft.     Tenderness: There is no abdominal tenderness.  Musculoskeletal:     Cervical back: Normal range of motion and neck supple.     Right lower leg: Edema present.     Left lower leg: Edema present.  Skin:    General: Skin is warm and dry.     Findings: No rash.  Neurological:     Mental Status: She is alert.  Psychiatric:        Mood and Affect: Mood is not anxious or depressed.        Speech: Speech normal.        Behavior: Behavior normal. Behavior is cooperative.        Thought Content: Thought content normal.        Judgment: Judgment normal.       Results for orders placed or performed in visit on 02/18/23  HM DIABETES EYE EXAM  Result Value Ref Range   HM Diabetic Eye Exam No Retinopathy No Retinopathy    Assessment and Plan  Other fatigue Assessment & Plan: Acute ongoing for the last 2 months.  Patient reports this is more falling asleep unexpectedly as opposed to really feeling tired overall.  She does believe she snores at night and has no one to observe her to determine if she is having any apneic spells.  Will evaluate with labs to make sure no poorly controlled thyroid issue on levothyroxine, no new liver or kidney issue, no electrolyte imbalance.  She denies blood sugar drops and has continuous blood sugar monitor She has history of anemia, will reevaluate.  She is on multiple sedating medications including Lyrica 225 mg in the morning that she has been on for years.  She states most of her pain  from her neuropathy is at bedtime so we will switch this to 100 mg twice daily as opposed to all at once in the morning. She is also recently started tramadol but this is only been in the last 2 weeks and she does not feel like this is sedating  Orders: -     Comprehensive metabolic panel -     CBC with Differential/Platelet -     Vitamin B12 -     TSH -     Ambulatory referral to Pulmonology  Snoring -     Ambulatory referral to Pulmonology  Murmur Assessment & Plan: Acute.  Recent echo.  Appears stable   Other orders -     Pregabalin; Take 1 capsule (100 mg total) by mouth 2 (two) times daily.  Dispense: 60 capsule; Refill: 0    No follow-ups on file.   Eliezer Lofts, MD

## 2023-03-13 NOTE — Progress Notes (Unsigned)
03/13/23- 74 yoF never smoker for sleep evaluation courtesy of Dr Diona Browner with concernss of snoring and fatigue. Medical problem list includes HTN, Periphjeral Varices, Allergic Rhinitis, COPD/Emphysema, GERD, Hypothyroid, DM1, Hyperlipidemia, Autonomic Neuropathy, Obesity, Lumbar Spinal Stenosis,  Epworth score- Body weight today- Covid vax-

## 2023-03-16 ENCOUNTER — Telehealth: Payer: Self-pay | Admitting: Pharmacist

## 2023-03-16 ENCOUNTER — Encounter: Payer: Self-pay | Admitting: Internal Medicine

## 2023-03-16 ENCOUNTER — Ambulatory Visit (INDEPENDENT_AMBULATORY_CARE_PROVIDER_SITE_OTHER): Payer: Medicare Other | Admitting: Internal Medicine

## 2023-03-16 ENCOUNTER — Telehealth: Payer: Self-pay | Admitting: Pharmacy Technician

## 2023-03-16 VITALS — BP 144/68 | HR 86 | Ht 65.0 in | Wt 180.2 lb

## 2023-03-16 DIAGNOSIS — R400 Somnolence: Secondary | ICD-10-CM | POA: Diagnosis not present

## 2023-03-16 DIAGNOSIS — R011 Cardiac murmur, unspecified: Secondary | ICD-10-CM | POA: Diagnosis not present

## 2023-03-16 DIAGNOSIS — Z596 Low income: Secondary | ICD-10-CM

## 2023-03-16 DIAGNOSIS — G4733 Obstructive sleep apnea (adult) (pediatric): Secondary | ICD-10-CM | POA: Insufficient documentation

## 2023-03-16 NOTE — Assessment & Plan Note (Signed)
Echo in 2023 identified mitral regurgitation.  Murmur seems loud for that.

## 2023-03-16 NOTE — Progress Notes (Signed)
[place] [facility])                                            [place] Team    03/16/2023  Karla Patton 11/21/1951 [id number]  Placed telephonic outreach to Karla Patton to confirm reason for no longer using the Omni insulin pump. Patient reports that she changed her prescription drug plan to [facility] and the pump is no longer affordable. Will notify patient's endocrinologist.   Loretha Brasil, PharmD [facility] Clinical Pharmacist Direct Dial: [phone]

## 2023-03-16 NOTE — Patient Instructions (Signed)
Order- home sleep test      dx somnolence  Please call 2 weeks after your sleep test for result and recommendations  Play attention to those palpitations- that might be a clue.

## 2023-03-16 NOTE — Progress Notes (Signed)
[place] [facility])                                            [place] Team    03/16/2023  Karla Patton [birth year] [id number]                                      Medication Assistance Referral  Referral From:  [facility] R[facility]  Karla Patton  Medication/Company: [employer]ant[employer] / [employer] Patient application portion:  Education officer, museum portion: Faxed  to Dr. Collier Flowers Provider address/fax verified via: Office website  Medication/Company: [employer] / Novo Nordisk Patient application portion:  Education officer, museum portion: Faxed  to Dr. Collier Flowers Provider address/fax verified via: Office website  Karla Patton P. Lemma [place], [place]  [phone]

## 2023-03-16 NOTE — Assessment & Plan Note (Signed)
Brief episodes of what might be intrusive sleep occurring mostly during times when she is sitting quietly such as reading or driving.  These do not seem to be triggered events like cataplexy.  Her observation that palpitations may be associated is of interest and it may be helpful to have her wear an arrhythmia monitor.  We will try to address the sleep question. Plan-schedule home sleep test.

## 2023-03-24 ENCOUNTER — Telehealth: Payer: Self-pay

## 2023-03-24 ENCOUNTER — Ambulatory Visit: Payer: Medicare Other

## 2023-03-24 DIAGNOSIS — G4733 Obstructive sleep apnea (adult) (pediatric): Secondary | ICD-10-CM

## 2023-03-24 DIAGNOSIS — R400 Somnolence: Secondary | ICD-10-CM

## 2023-03-24 NOTE — Telephone Encounter (Signed)
Clinical notes faxed through Epic to [place] per fax request.

## 2023-03-25 ENCOUNTER — Other Ambulatory Visit: Payer: Self-pay | Admitting: Internal Medicine

## 2023-03-25 ENCOUNTER — Other Ambulatory Visit: Payer: Self-pay | Admitting: *Deleted

## 2023-03-25 DIAGNOSIS — E039 Hypothyroidism, unspecified: Secondary | ICD-10-CM

## 2023-03-25 DIAGNOSIS — E1069 Type 1 diabetes mellitus with other specified complication: Secondary | ICD-10-CM

## 2023-03-25 MED ORDER — ROSUVASTATIN CALCIUM 10 MG PO TABS: 10.0000 mg | ORAL_TABLET | Freq: Every day | ORAL | 1 refills | Status: DC

## 2023-03-26 DIAGNOSIS — M1712 Unilateral primary osteoarthritis, left knee: Secondary | ICD-10-CM | POA: Diagnosis not present

## 2023-03-26 DIAGNOSIS — M13862 Other specified arthritis, left knee: Secondary | ICD-10-CM | POA: Diagnosis not present

## 2023-04-02 DIAGNOSIS — M1712 Unilateral primary osteoarthritis, left knee: Secondary | ICD-10-CM | POA: Diagnosis not present

## 2023-04-08 DIAGNOSIS — G4733 Obstructive sleep apnea (adult) (pediatric): Secondary | ICD-10-CM | POA: Diagnosis not present

## 2023-04-09 ENCOUNTER — Other Ambulatory Visit: Payer: Self-pay | Admitting: Internal Medicine

## 2023-04-09 ENCOUNTER — Encounter: Payer: Self-pay | Admitting: Emergency Medicine

## 2023-04-09 ENCOUNTER — Ambulatory Visit
Admission: EM | Admit: 2023-04-09 | Discharge: 2023-04-09 | Disposition: A | Discharge status: Home / Self Care | Payer: Medicare Other | Attending: Emergency Medicine | Admitting: Emergency Medicine

## 2023-04-09 DIAGNOSIS — W57XXXA Bitten or stung by nonvenomous insect and other nonvenomous arthropods, initial encounter: Secondary | ICD-10-CM

## 2023-04-09 DIAGNOSIS — S30860A Insect bite (nonvenomous) of lower back and pelvis, initial encounter: Secondary | ICD-10-CM | POA: Diagnosis not present

## 2023-04-09 MED ORDER — DOXYCYCLINE HYCLATE 100 MG PO CAPS: 200.0000 mg | ORAL_CAPSULE | Freq: Every day | ORAL | 0 refills | Status: AC

## 2023-04-09 NOTE — ED Provider Notes (Signed)
[facility]    CSN: [id number] Arrival date & time: 04/09/23  1642      History   Chief Complaint Chief Complaint  Patient presents with   Tick Removal    HPI Karla Patton is a 72 y.o. female.   Patient presents for evaluation of site of tick bite that occurred 3 days ago.  Bite occurred to the center of the lower back, tick removed in its entirety by her husband.  Site is pruritic and she believes there is now a blister present.  Has been having someone take picture every morning and denies bull's-eye appearance.  Has been applying topical antibiotic ointment which has been minimally effective.  Denies fevers or drainage.  Past Medical History:  Diagnosis Date   Allergy 11/2021   Lisinolpril-cough   Anemia    Anxiety    Arthritis    Back pain    Diabetes mellitus without complication    type 1   Emphysema of lung    GERD (gastroesophageal reflux disease)    Heart murmur    Hypertension associated with diabetes 03/03/2018   Hypothyroidism    Thyroid disease    Varicose vein     Patient Active Problem List   Diagnosis Date Noted   Somnolence 03/16/2023   Other fatigue 03/12/2023   Autonomic neuropathy due to diabetes 12/31/2022   Localized osteoarthritis of left knee 12/31/2022   Allergic rhinitis 08/28/2022   Pruritus 08/28/2022   Murmur 05/27/2022   Gastroesophageal reflux disease without esophagitis 05/27/2022   Spinal stenosis of lumbar region 05/22/2022   Chronic lumbar pain 05/22/2022   Varicose veins of lower extremity with edema, bilateral 08/23/2021   Venous insufficiency of both lower extremities 08/23/2021   Hyperlipidemia due to type 1 diabetes mellitus 06/13/2021   Obesity (BMI 30-39.9) 06/13/2021   Hypertension associated with diabetes 03/03/2018   Osteopenia 01/13/2018   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 08/28/2017   Liver hemangioma 08/28/2017   Background  diabetic retinopathy 05/22/2015   Hypothyroidism 10/10/2014    Past Surgical History:  Procedure Laterality Date   ABDOMINAL HYSTERECTOMY  1994   CHOLECYSTECTOMY  09/07/2013   COLONOSCOPY  2009   normal   COLONOSCOPY WITH PROPOFOL N/A 10/20/2018   Procedure: COLONOSCOPY WITH PROPOFOL;  Surgeon: Earline Mayotte, MD;  Location: [facility] END[facility]COPY;  Service: Endoscopy;  Laterality: N/A;   INCONTINENCE SURGERY  2003   left leg vein ligation  2015   SPINE SURGERY  03-18-2021   VARICOSE VEIN SURGERY  1990's   Dr Lemar Livings   VEIN SURGERY Left 11/17/2017   vein closure procedure/Dr Evette Cristal    OB History     Gravida  2   Para  2   Term  2   Preterm      AB      Living  2      SAB      IAB      Ectopic      Multiple      Live Births  1        Obstetric Comments  1st Menstrual Cycle:  14 1st Pregnancy:  24          Home Medications    Prior to Admission medications   Medication Sig Start Date End Date Taking? Authorizing Provider  doxycycline (VIBRAMYCIN) 100 MG capsule Take 2 capsules (200 mg total) by mouth daily for 1 dose. 04/09/23 04/10/23 Yes Benelli Winther,  Elita Boone, NP  acetaminophen (TYLENOL) 650 MG CR tablet Take 650 mg by mouth every 8 (eight) hours as needed for pain.    [provider]  APPLE CIDER VINEGAR PO Take 600 mg by mouth daily.     [provider]  aspirin EC 81 MG tablet Take 81 mg by mouth daily. Takes at bedtime    [provider]  beta carotene 16109 UNIT capsule Take 25,000 Units by mouth daily.    [provider]  Cholecalciferol (VITAMIN D3) 250 MCG (10000 UT) capsule Take 10,000 Units by mouth daily.    [provider]  Continuous Blood Gluc Sensor (DEXCOM G6 SENSOR) MISC UsE TO CHECK BLOOD SUGAR THROUGHOUT THE DAY REPLACE EVERY 14 DAYS 04/08/22   Drubel, Lillia Abed, PA-C  Ferrous Sulfate Dried (HIGH POTENCY IRON) 65 MG TABS Take 1 tablet by mouth daily.    [provider]  fluticasone  (FLONASE) 50 MCG/ACT nasal spray SPRAY 2 SPRAYS INTO EACH NOSTRIL EVERY DAY 09/25/22   Drubel, Lillia Abed, PA-C  furosemide (LASIX) 20 MG tablet TAKE 1 TABLET BY MOUTH DAILY 12/31/22   Bedsole, Amy E, MD  glucose blood (ACCU-CHEK GUIDE) test strip 1 each by Other route daily in the afternoon. Use as instructed 11/12/22   Shamleffer, Konrad Dolores, MD  insulin aspart (NOVOLOG) 100 UNIT/ML injection Max Daily 50 units per pump 08/22/22   Shamleffer, Konrad Dolores, MD  insulin glargine (LANTUS SOLOSTAR) 100 UNIT/ML Solostar Pen Inject 16 Units into the skin daily. 01/20/23   Shamleffer, Konrad Dolores, MD  Insulin Pen Needle 32G X 4 MM MISC 1 Device by Does not apply route in the morning, at noon, in the evening, and at bedtime. 01/20/23   Shamleffer, Konrad Dolores, MD  levothyroxine (SYNTHROID) 100 MCG tablet TAKE 1 TABLET BY MOUTH DAILY BEFORE BREAKFAST. 03/25/23   Shamleffer, Konrad Dolores, MD  MAGNESIUM-ZINC PO Take 1 tablet by mouth daily.    [provider]  Multiple Vitamins-Minerals (ABC PLUS SENIOR ADULTS 50+ PO) Take 1 tablet by mouth daily. 11/19/20   [provider]  omeprazole (PRILOSEC OTC) 20 MG tablet Take 20 mg by mouth daily.    [provider]  potassium chloride SA (KLOR-CON M) 20 MEQ tablet Take 1 tablet (20 mEq total) by mouth daily. Once daily along with lasix. 12/31/22   Bedsole, Amy E, MD  pregabalin (LYRICA) 100 MG capsule Take 1 capsule (100 mg total) by mouth 2 (two) times daily. 03/12/23   Bedsole, Amy E, MD  rosuvastatin (CRESTOR) 10 MG tablet Take 1 tablet (10 mg total) by mouth daily. 03/25/23   Bedsole, Amy E, MD  traMADol (ULTRAM) 50 MG tablet Take 50 mg by mouth every 6 (six) hours as needed. Patient not taking: Reported on 03/16/2023 03/09/23   [provider]    Family History Family History  Problem Relation Age of Onset   Diabetes Mother    Pancreatitis Mother    Heart disease Father    Stroke Father        x 2   Hypertension  Father    Varicose Veins Father    Fibromyalgia Sister    Scoliosis Sister    Arthritis Sister    Diabetes Sister    Breast cancer Neg Hx     Social History Social History   Tobacco Use   Smoking status: Never   Smokeless tobacco: Never  Vaping Use   Vaping Use: Never used  Substance Use Topics   Alcohol  use: Yes    Alcohol/week: 9.0 standard drinks of alcohol    Types: 7 Glasses of wine, 2 Shots of liquor per week    Comment: vodka 1 drink nightly  and/ or wine   Drug use: No     Allergies   Lisinopril   Review of Systems Review of Systems   Physical Exam Triage Vital Signs ED Triage Vitals  Enc Vitals Group     BP 04/09/23 1742 (!) 143/75     Pulse Rate 04/09/23 1742 69     Resp 04/09/23 1742 18     Temp 04/09/23 1742 98.4 F (36.9 C)     Temp Source 04/09/23 1742 Oral     SpO2 04/09/23 1742 94 %     Weight --      Height --      Head Circumference --      Peak Flow --      Pain Score 04/09/23 1741 0     Pain Loc --      Pain Edu? --      Excl. in GC? --    No data found.  Updated Vital Signs BP (!) 143/75 (BP Location: Right Arm)   Pulse 69   Temp 98.4 F (36.9 C) (Oral)   Resp 18   SpO2 94%   Visual Acuity Right Eye Distance:   Left Eye Distance:   Bilateral Distance:    Right Eye Near:   Left Eye Near:    Bilateral Near:     Physical Exam Constitutional:      Appearance: Normal appearance.  Eyes:     Extraocular Movements: Extraocular movements intact.  Pulmonary:     Effort: Pulmonary effort is normal.  Skin:    Comments: 0.5 cm circular firm lesion present to the center of the lower back, erythematous but nontender, nondraining  Neurological:     Mental Status: She is alert and oriented to person, place, and time. Mental status is at baseline.      UC Treatments / Results  Labs (all labs ordered are listed, but only abnormal results are displayed) Labs Reviewed - No data to display  EKG   Radiology No results  found.  Procedures Procedures (including critical care time)  Medications Ordered in UC Medications - No data to display  Initial Impression / Assessment and Plan / UC Course  I have reviewed the triage vital signs and the nursing notes.  Pertinent labs & imaging results that were available during my care of the patient were reviewed by me and considered in my medical decision making (see chart for details).  Tick bite of lower back, initial encounter  Appears to be skin irritation, discussed with patient, no current signs of infection, prophylactically providing 200 mg of doxycycline, recommended continued use of topical antibiotic ointment with avoidance of scratching, advised hydrocortisone for management of pruritus, advised to monitor closely and follow-up for any concerns regarding healing Final Clinical Impressions(s) / UC Diagnoses   Final diagnoses:  Tick bite of lower back, initial encounter     Discharge Instructions      On evaluation there is a raised firm bump onto the back and area is red,  this is most likely due to irritation due to the bite, no current signs of infection, appearance of the area is not consistent with a bull's-eye appearance of Lyme disease  Prophylactically take doxycycline, take both tablets at the same time, this is the prophylactic dose to prevent infection  for tick bites  You may continue use of topical antibiotic ointment over the area especially if you begin to scratch  You may also apply topical hydrocortisone cream over the affected area twice a day as needed, this will help to calm the itching  You may follow-up with his urgent care as needed for any further concerns     ED Prescriptions     Medication Sig Dispense Auth. Provider   doxycycline (VIBRAMYCIN) 100 MG capsule Take 2 capsules (200 mg total) by mouth daily for 1 dose. 2 capsule Valinda Hoar, NP      PDMP not reviewed this encounter.   Valinda Hoar,  [place] 04/09/23 [phone]

## 2023-04-09 NOTE — Discharge Instructions (Signed)
On evaluation there is a raised firm bump onto the back and area is red,  this is most likely due to irritation due to the bite, no current signs of infection, appearance of the area is not consistent with a bull's-eye appearance of Lyme disease  Prophylactically take doxycycline, take both tablets at the same time, this is the prophylactic dose to prevent infection for tick bites  You may continue use of topical antibiotic ointment over the area especially if you begin to scratch  You may also apply topical hydrocortisone cream over the affected area twice a day as needed, this will help to calm the itching  You may follow-up with his urgent care as needed for any further concerns

## 2023-04-09 NOTE — ED Triage Notes (Signed)
Pts husband removed a tick from her lower back 4 days. She now has a blister in that area.

## 2023-04-10 ENCOUNTER — Other Ambulatory Visit: Payer: Self-pay

## 2023-04-10 ENCOUNTER — Other Ambulatory Visit: Payer: Self-pay | Admitting: Internal Medicine

## 2023-04-10 MED ORDER — NOVOLOG FLEXPEN 100 UNIT/ML ~~LOC~~ SOPN: PEN_INJECTOR | SUBCUTANEOUS | 3 refills | Status: DC

## 2023-04-17 ENCOUNTER — Telehealth: Payer: Self-pay | Admitting: Pharmacist

## 2023-04-17 NOTE — Progress Notes (Signed)
[facility] Customer service manager [facility])                                            [facility] Quality Pharmacy Team    04/17/2023  Ashleyann Shoun 01-Oct-1951 [id number]  Placed telephone outreach to Ms. Myah Guynes to follow up on patient assistance applications for [employer]. All questions were answered and patient was appreciative of my call.    Reynold Bowen, PharmD [facility]  [facility] Clinical Pharmacist Direct Dial: [phone]

## 2023-04-22 ENCOUNTER — Other Ambulatory Visit: Payer: Self-pay | Admitting: Family Medicine

## 2023-04-22 MED ORDER — PREGABALIN 100 MG PO CAPS: 100.0000 mg | ORAL_CAPSULE | Freq: Two times a day (BID) | ORAL | 0 refills | Status: DC

## 2023-04-22 MED ORDER — PREGABALIN 100 MG PO CAPS: 100.0000 mg | ORAL_CAPSULE | Freq: Two times a day (BID) | ORAL | 1 refills | Status: DC

## 2023-04-22 NOTE — Progress Notes (Signed)
Spoke with patient.  She uses [facility].  I tried to send Rx to [facility] but the Rx still printed so I faxed the prescription to [phone].

## 2023-04-22 NOTE — Progress Notes (Signed)
Re: Lyrica prescription   please call patient: The pharmacy we have listed results in a printed prescription.  This is the pharmacy she wants [place] to send it to?  If so please print for me as I cannot print from this computer.

## 2023-04-22 NOTE — Telephone Encounter (Signed)
Prescription Request  04/22/2023  LOV: 03/12/2023  What is the name of the medication or equipment?  pregabalin pregabalin (LYRICA) 100 MG capsule  Have you contacted your pharmacy to request a refill? No   Which pharmacy would you like this sent to?  [id number] [place], [place] - [address] AT [facility] OF [address] [place] [address] Phone: [phone] Fax: [phone]    Patient notified that their request is being sent to the clinical staff for review and that they should receive a response within 2 business days.   Please advise at Mobile [phone] (mobile)

## 2023-04-22 NOTE — Telephone Encounter (Signed)
Last office visit 03/12/23 Last refill 03/12/23 #60

## 2023-04-23 NOTE — Telephone Encounter (Signed)
Patient called in and stated that the medication with [facility] is $400. She would like for this refill request to be sent over to [id number] - [place], [place] - [address] Thank you!

## 2023-04-24 MED ORDER — PREGABALIN 100 MG PO CAPS: 100.0000 mg | ORAL_CAPSULE | Freq: Two times a day (BID) | ORAL | 0 refills | Status: DC

## 2023-04-24 NOTE — Telephone Encounter (Signed)
Patient notified as instructed by telephone and verbalized understanding. 

## 2023-04-24 NOTE — Addendum Note (Signed)
Addended by: Kerby Nora E on: 04/24/2023 09:15 AM   Modules accepted: Orders

## 2023-04-24 NOTE — Telephone Encounter (Signed)
PDMP reviewed.  Medication not filled at [facility]. [facility]  Let patient know. Also let patient know that I did send in a 90-day supply so she would like to have 30-day supply at a time to make this more affordable each month she can.
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
## 2023-04-24 NOTE — Telephone Encounter (Signed)
Left VM requesting pt to call the office back 

## 2023-04-25 ENCOUNTER — Other Ambulatory Visit: Payer: Self-pay | Admitting: Family Medicine

## 2023-04-27 ENCOUNTER — Encounter: Payer: Self-pay | Admitting: Internal Medicine

## 2023-04-27 ENCOUNTER — Ambulatory Visit (INDEPENDENT_AMBULATORY_CARE_PROVIDER_SITE_OTHER): Payer: Medicare Other

## 2023-04-27 ENCOUNTER — Ambulatory Visit (INDEPENDENT_AMBULATORY_CARE_PROVIDER_SITE_OTHER): Payer: Medicare Other | Admitting: Internal Medicine

## 2023-04-27 VITALS — BP 140/82 | HR 76 | Ht 65.0 in | Wt 177.6 lb

## 2023-04-27 DIAGNOSIS — G4733 Obstructive sleep apnea (adult) (pediatric): Secondary | ICD-10-CM | POA: Diagnosis not present

## 2023-04-27 DIAGNOSIS — R053 Chronic cough: Secondary | ICD-10-CM

## 2023-04-27 DIAGNOSIS — K219 Gastro-esophageal reflux disease without esophagitis: Secondary | ICD-10-CM

## 2023-04-27 NOTE — Patient Instructions (Addendum)
Order- referral to Dr Althea Grimmer, orthodontist     consider oral appliance for OSA  Order- CXR   dx chronic cough

## 2023-04-27 NOTE — Telephone Encounter (Signed)
See previous refill Refill was sent to [facility][id number] on 04/24/23

## 2023-05-14 ENCOUNTER — Encounter: Payer: Self-pay | Admitting: Internal Medicine

## 2023-05-14 ENCOUNTER — Ambulatory Visit (INDEPENDENT_AMBULATORY_CARE_PROVIDER_SITE_OTHER): Payer: Medicare Other | Admitting: Internal Medicine

## 2023-05-14 VITALS — BP 136/84 | HR 61 | Ht 65.0 in | Wt 180.0 lb

## 2023-05-14 DIAGNOSIS — E1065 Type 1 diabetes mellitus with hyperglycemia: Secondary | ICD-10-CM | POA: Diagnosis not present

## 2023-05-14 DIAGNOSIS — E039 Hypothyroidism, unspecified: Secondary | ICD-10-CM | POA: Diagnosis not present

## 2023-05-14 LAB — POCT GLYCOSYLATED HEMOGLOBIN (HGB A1C): Hemoglobin A1C: 7.3 % — AB (ref 4.0–5.6)

## 2023-05-14 MED ORDER — PREGABALIN 225 MG PO CAPS: 225.0000 mg | ORAL_CAPSULE | Freq: Every day | ORAL | 1 refills | Status: DC

## 2023-05-14 MED ORDER — LEVOTHYROXINE SODIUM 100 MCG PO TABS
100.0000 ug | ORAL_TABLET | Freq: Every day | ORAL | 3 refills | Status: DC
Start: 2023-05-14 — End: 2024-03-08

## 2023-05-14 MED ORDER — NOVOLOG FLEXPEN 100 UNIT/ML ~~LOC~~ SOPN: PEN_INJECTOR | SUBCUTANEOUS | 3 refills | Status: DC

## 2023-05-14 MED ORDER — INSULIN PEN NEEDLE 32G X 4 MM MISC: 1.0000 | Freq: Four times a day (QID) | 3 refills | Status: DC

## 2023-05-14 MED ORDER — LANTUS SOLOSTAR 100 UNIT/ML ~~LOC~~ SOPN: 12.0000 [IU] | PEN_INJECTOR | Freq: Every day | SUBCUTANEOUS | 4 refills | Status: DC

## 2023-05-14 NOTE — Patient Instructions (Addendum)
Contact Tandem pump rep to send forms to our office at [phone]    Lantus 12 units daily  Novolog one unit for every 12 grams of carbohydrates  Novolog correctional insulin: ADD extra units on insulin to your meal-time Novolog dose if your blood sugars are higher than 170. Use the scale below to help guide you:   Blood sugar before meal Number of units to inject  Less than 170 0 unit  171 -  210 1 units  211 -  250 2 units  251 -  290 3 units  291 -  330 4 units  331 -  370 5 units  371 -  410 6 units     HOW TO TREAT LOW BLOOD SUGARS (Blood sugar LESS THAN 70 MG/DL) Please follow the RULE OF 15 for the treatment of hypoglycemia treatment (when your (blood sugars are less than 70 mg/dL)   STEP 1: Take 15 grams of carbohydrates when your blood sugar is low, which includes:  3-4 GLUCOSE TABS  OR 3-4 OZ OF JUICE OR REGULAR SODA OR ONE TUBE OF GLUCOSE GEL    STEP 2: RECHECK blood sugar in 15 MINUTES STEP 3: If your blood sugar is still low at the 15 minute recheck --> then, go back to STEP 1 and treat AGAIN with another 15 grams of carbohydrates.

## 2023-05-14 NOTE — Progress Notes (Signed)
Name: Karla Patton  MRN/ DOB: [id number], Mar 10, 1951   Age/ Sex: 72 y.o., female    PCP: Excell Seltzer, MD   Reason for Endocrinology Evaluation: Type 1 Diabetes Mellitus     Date of Initial Endocrinology Visit: 02/27/2022    PATIENT IDENTIFIER: Karla Patton is a 72 y.o. female with a past medical history of T1DM, Hypothyroidism, Dyslipidemia . The patient presented for initial endocrinology clinic visit on 02/27/2022 for consultative assistance with her diabetes management.    HPI: Karla Patton was    Diagnosed with DM at age 4  Prior Medications tried/Intolerance: She was on Medtronic but did not like the tubing   Hemoglobin A1c has ranged from 6.8% in 2022, peaking at 8.9% in 2019. Patient required assistance for hypoglycemia: yes x2 over the years    On her initial visit to our clinic, her A1c is 7.5% she was on Lantus, and NovoLog which we adjusted   She was started on OmniPod in September 2023 but by January 2024 she had to stop the OmniPod due to increasing cost    SUBJECTIVE:   During the last visit (11/12/2022): A1c 7.0%   Today (05/14/23): Karla Patton is here for follow-up on diabetes management.  She checks her blood sugars multiple  times daily, through CGM .The patient has  had hypoglycemic episodes since the last clinic visit   She was evaluated by pulmonology for sleep study due to fatigue and snoring 04/2023, considering oral appliance for OSA  She continues with constipation, uses occasional Senna  Denies nausea or vomiting  Uses the Golo tablets with meals   She is on Lyrica 225 mg, PCP tried to change to 100 mg BID but she has not been able to get it and it makes her sleepy during the day   HOME DIABETES REGIMEN: Lantus 16 units daily  NovoLog 1:15 Lyrica 225 mg  QHS      Statin: yes ACE-I/ARB: yes     CONTINUOUS GLUCOSE MONITORING RECORD INTERPRETATION    Dates of Recording:5/10-5/23/2023  Sensor description:  dexcom  Results statistics:   CGM use % of time 93  Average and SD 186/65  Time in range  51%  % Time Above 180 33  % Time above 250 15  % Time Below target < 1       Glycemic patterns summary: BG's optimal overnight and trend up during the day   Hyperglycemic episodes  postprandial  Hypoglycemic episodes occurred at night  Overnight periods: Trends down      DIABETIC COMPLICATIONS: Microvascular complications:  Non proliferative DR, neuropathy  Denies: CKD  Last eye exam: Completed 02/06/2023  Macrovascular complications:   Denies: CAD, PVD, CVA   PAST HISTORY: Past Medical History:  Past Medical History:  Diagnosis Date   Allergy 11/2021   Lisinolpril-cough   Anemia    Anxiety    Arthritis    Back pain    Diabetes mellitus without complication (HCC)    type 1   Emphysema of lung (HCC)    GERD (gastroesophageal reflux disease)    Heart murmur    Hypertension associated with diabetes (HCC) 03/03/2018   Hypothyroidism    Thyroid disease    Varicose vein    Past Surgical History:  Past Surgical History:  Procedure Laterality Date   ABDOMINAL HYSTERECTOMY  1994   CHOLECYSTECTOMY  09/07/2013   COLONOSCOPY  2009   normal   COLONOSCOPY WITH PROPOFOL N/A 10/20/2018   Procedure: COLONOSCOPY WITH  PROPOFOL;  Surgeon: Earline Mayotte, MD;  Location: [facility]COPY;  Service: Endoscopy;  Laterality: N/A;   INCONTINENCE SURGERY  2003   left leg vein ligation  2015   SPINE SURGERY  03-18-2021   VARICOSE VEIN SURGERY  1990's   Dr Lemar Livings   VEIN SURGERY Left 11/17/2017   vein closure procedure/Dr Evette Cristal    Social History:  reports that she has never smoked. She has never used smokeless tobacco. She reports current alcohol use of about 9.0 standard drinks of alcohol per week. She reports that she does not use drugs. Family History:  Family History  Problem Relation Age of Onset   Diabetes Mother    Pancreatitis Mother    Heart disease Father    Stroke  Father        x 2   Hypertension Father    Varicose Veins Father    Fibromyalgia Sister    Scoliosis Sister    Arthritis Sister    Diabetes Sister    Breast cancer Neg Hx      HOME MEDICATIONS: Allergies as of 05/14/2023       Reactions   Lisinopril Cough        Medication List        Accurate as of May 14, 2023 10:25 AM. If you have any questions, ask your nurse or doctor.          ABC PLUS SENIOR ADULTS 50+ PO Take 1 tablet by mouth daily.   Accu-Chek Guide test strip Generic drug: glucose blood 1 each by Other route daily in the afternoon. Use as instructed   acetaminophen 650 MG CR tablet Commonly known as: TYLENOL Take 650 mg by mouth every 8 (eight) hours as needed for pain.   APPLE CIDER VINEGAR PO Take 600 mg by mouth daily.   aspirin EC 81 MG tablet Take 81 mg by mouth daily. Takes at bedtime   beta carotene 21308 UNIT capsule Take 25,000 Units by mouth daily.   Dexcom G6 Sensor Misc UsE TO CHECK BLOOD SUGAR THROUGHOUT THE DAY REPLACE EVERY 14 DAYS   fluticasone 50 MCG/ACT nasal spray Commonly known as: FLONASE SPRAY 2 SPRAYS INTO EACH NOSTRIL EVERY DAY   furosemide 20 MG tablet Commonly known as: LASIX TAKE 1 TABLET BY MOUTH DAILY   High Potency Iron 65 MG Tabs Take 1 tablet by mouth daily.   insulin aspart 100 UNIT/ML injection Commonly known as: NovoLOG Max Daily 50 units per pump   NovoLOG FlexPen 100 UNIT/ML FlexPen Generic drug: insulin aspart MAX DAILY 30 UNITS   Insulin Pen Needle 32G X 4 MM Misc 1 Device by Does not apply route in the morning, at noon, in the evening, and at bedtime.   Lantus SoloStar 100 UNIT/ML Solostar Pen Generic drug: insulin glargine Inject 16 Units into the skin daily.   levothyroxine 100 MCG tablet Commonly known as: SYNTHROID TAKE 1 TABLET BY MOUTH DAILY BEFORE BREAKFAST.   MAGNESIUM-ZINC PO Take 1 tablet by mouth daily.   omeprazole 20 MG tablet Commonly known as: PRILOSEC OTC Take 20  mg by mouth daily.   potassium chloride SA 20 MEQ tablet Commonly known as: KLOR-CON M Take 1 tablet (20 mEq total) by mouth daily. Once daily along with lasix.   pregabalin 100 MG capsule Commonly known as: Lyrica Take 1 capsule (100 mg total) by mouth 2 (two) times daily.   rosuvastatin 10 MG tablet Commonly known as: CRESTOR Take 1 tablet (10 mg total) by  mouth daily.   traMADol 50 MG tablet Commonly known as: ULTRAM Take 50 mg by mouth every 6 (six) hours as needed.   Vitamin D3 250 MCG (10000 UT) capsule Take 10,000 Units by mouth daily.         ALLERGIES: Allergies  Allergen Reactions   Lisinopril Cough         OBJECTIVE:   VITAL SIGNS: BP 136/84 (BP Location: Left Arm, Patient Position: Sitting, Cuff Size: Large)   Pulse 61   Ht 5\' 5"  (1.651 m)   Wt 180 lb (81.6 kg)   SpO2 99%   BMI 29.95 kg/m      PHYSICAL EXAM:  General: Pt appears well and is in NAD  Lungs: Clear with good BS bilat   Heart: RRR   Abdomen: soft, nontender  Extremities:  Lower extremities - trace    Neuro: MS is good with appropriate affect, pt is alert and Ox3    DM foot exam: 05/14/2023  The skin of the feet is without sores or ulcerations, right foot deformity  The pedal pulses are 2+ on right and 2+ on left. The sensation is decreased  to a screening 5.07, 10 gram monofilament on the right     DATA REVIEWED:  Lab Results  Component Value Date   HGBA1C 7.3 (A) 05/14/2023   HGBA1C 7.0 (A) 11/12/2022   HGBA1C 7.7 (A) 06/30/2022    Latest Reference Range & Units 03/12/23 11:47  Sodium 135 - 145 mEq/L 134 (L)  Potassium 3.5 - 5.1 mEq/L 4.1  Chloride 96 - 112 mEq/L 95 (L)  CO2 19 - 32 mEq/L 34 (H)  Glucose 70 - 99 mg/dL 53 (L)  BUN 6 - 23 mg/dL 10  Creatinine 1.61 - 0.96 mg/dL 0.45  Calcium 8.4 - 40.9 mg/dL 9.6  Alkaline Phosphatase 39 - 117 U/L 81  Albumin 3.5 - 5.2 g/dL 4.4  AST 0 - 37 U/L 30  ALT 0 - 35 U/L 23  Total Protein 6.0 - 8.3 g/dL 7.7  Total  Bilirubin 0.2 - 1.2 mg/dL 0.5  GFR >81.19 mL/min 89.54  (L): Data is abnormally low (H): Data is abnormally high    Latest Reference Range & Units 05/01/15 14:41  Glutamic Acid Decarb Ab < IU/mL >250 (H)   ASSESSMENT / PLAN / RECOMMENDATIONS:   1) Type 1 Diabetes Mellitus, Sub-OPtimally controlled, With neuropathic and retinopathic complications - Most recent A1c of 7.3 %. Goal A1c < 7.0 %.    -A1c has trended up slightly, she had to switch back to MDI January 2024 as the OmniPod cost has increased -She is interested in another pump that will be covered under Plan [employer], I have recommended the tandem pump, she was given contact information to start the paper process, and no cost at this time but she will know once these forms have been faxed to the company -I will reduce her basal insulin as below, patient has been noted with hypoglycemia overnight -I have asked her to continue to count her carbohydrates and will change her insulin to carb ratio from I:15 to 1:12 -She will also be provided with a correction scale to be used before each meal -Patient with fear of hypoglycemia at work, she was advised to reduce her prandial dose by 2 units while at work   MEDICATIONS: Decrease Lantus 12 units daily Novolog 1:C ratio 1:12 TIDQAC Start correction factor: NovoLog (BG -130/40) TID       EDUCATION / INSTRUCTIONS: BG monitoring instructions:  Patient is instructed to check her blood sugars 3 times a day, before meals. Call [place] [facility] if: BG persistently < 70 I reviewed the Rule of 15 for the treatment of hypoglycemia in detail with the patient. Literature supplied.   2) Diabetic complications:  Eye: Does  have known diabetic retinopathy.  Neuro/ Feet: Does  have known diabetic peripheral neuropathy. Renal: Patient does not have known baseline CKD. She is  on an ACEI/ARB at present.      3) Hypothyroidism:   -TSH is normal   Medication Levothyroxine 100 mcg  daily   F/U in 6 months    Signed electronically by: Lyndle Herrlich, MD  [facility]  [facility] [address] [place] [place], [place] [address] Phone: [phone] FAX: [phone]   CC: Excell Seltzer, MD [address] [place] [address] Phone: [phone]  Fax: [phone]    Return to Endocrinology clinic as below: Future Appointments  Date Time Provider Department Center  08/28/2023 10:30 AM Waymon Budge, MD [facility] None

## 2023-05-15 ENCOUNTER — Encounter: Payer: Self-pay | Admitting: Internal Medicine

## 2023-05-20 ENCOUNTER — Encounter: Payer: Self-pay | Admitting: Internal Medicine

## 2023-05-20 DIAGNOSIS — R053 Chronic cough: Secondary | ICD-10-CM | POA: Insufficient documentation

## 2023-05-20 NOTE — Assessment & Plan Note (Signed)
Appropriate discussion done.  Treatment options reviewed. Plan-referral to consider oral appliance for treatment of OSA

## 2023-05-20 NOTE — Assessment & Plan Note (Signed)
We discussed common causes and some simple measures. Plan-CXR

## 2023-05-20 NOTE — Assessment & Plan Note (Signed)
Emphasized attention to reflux precautions since reflux may be contributing to her dry cough.

## 2023-05-27 ENCOUNTER — Telehealth: Payer: Self-pay | Admitting: Pharmacy Technician

## 2023-05-27 DIAGNOSIS — Z5986 Financial insecurity: Secondary | ICD-10-CM

## 2023-05-27 NOTE — Progress Notes (Signed)
[facility])                                            [facility] Quality Pharmacy Team   ADDENDUM 05/28/2023 Resubmitted [employer] applicaton (the new version) per their request.  Noreene Larsson P. Carolyne Fiscal [facility]  [phone]  05/27/2023  Alevia Hanaway [birth year] [phone]  2 care coordination calls placed as outlined below:  First Care coordination call placed to Novo Nordisk in regard to Novolog application.  Spoke to [place] who informs patient is APPROVED 05/25/23-12/22/23. Medication will automatically ship to prescriber's office on file. Subsequent refills will process and ship automatically as well. However, if patient feels like supply is not adequate until next shipment, patient can call Novo Nordisk at [phone] to request refill.  Second Care coordination call placed to [employer] in regard to Lantus application.  Spoke to [place] who informs the application that was submitted was the old version. He informs [employer]i change the application at the end of May. Inquired if an exception could be made as the patient signed the application in April when the old application was still in place. He informs [employer] gives no exception or leeway and that when a new application is released the old one is null. He informs new application would need to be completed by patient and provider. Will resubmit.  Tahnee Cifuentes P. Nga Rabon, CPhT [facility]  [phone]

## 2023-05-28 ENCOUNTER — Encounter: Payer: Self-pay | Admitting: Internal Medicine

## 2023-05-28 ENCOUNTER — Telehealth: Payer: Self-pay

## 2023-05-28 NOTE — Telephone Encounter (Signed)
Patient needs C-pepride and fasting glucose for pump

## 2023-06-02 ENCOUNTER — Telehealth: Payer: Self-pay | Admitting: Pharmacy Technician

## 2023-06-02 DIAGNOSIS — Z5986 Financial insecurity: Secondary | ICD-10-CM

## 2023-06-02 NOTE — Progress Notes (Signed)
[facility])                                            [facility] Quality Pharmacy Team    06/02/2023  Karla Patton 1951/07/02 [id number]  Care coordination call placed to [employer] in regard to Lantus application.  Spoke to [name] who informs patient is APPROVED 05/29/23-12/22/23. Medication will automatically ship to prescriber's office on file. Subsequent refills will process and ship automatically as well. However, if patient feels like supply is not adequate until next shipment, patient can call [employer] at [phone] to request refill.  [name] P. Coulter Oldaker, CPhT [facility]  [phone]

## 2023-06-16 ENCOUNTER — Telehealth: Payer: Self-pay

## 2023-06-16 NOTE — Telephone Encounter (Signed)
Left vm that patient assistance is ready for pick up 

## 2023-06-22 NOTE — Telephone Encounter (Signed)
Patient came in to office today and picked up 1 box of patient assistance Lantus,4 boxes of Novolog, & 2 boxes Pen Needles.

## 2023-07-02 ENCOUNTER — Encounter: Payer: Self-pay | Admitting: Family Medicine

## 2023-07-02 ENCOUNTER — Ambulatory Visit (INDEPENDENT_AMBULATORY_CARE_PROVIDER_SITE_OTHER)
Admission: RE | Admit: 2023-07-02 | Discharge: 2023-07-02 | Disposition: A | Discharge status: Home / Self Care | Payer: Medicare Other | Source: Ambulatory Visit | Attending: Family Medicine | Admitting: Family Medicine

## 2023-07-02 ENCOUNTER — Other Ambulatory Visit: Payer: Self-pay

## 2023-07-02 ENCOUNTER — Ambulatory Visit (INDEPENDENT_AMBULATORY_CARE_PROVIDER_SITE_OTHER): Payer: Medicare Other | Admitting: Family Medicine

## 2023-07-02 VITALS — BP 134/66 | HR 75 | Temp 98.2°F | Ht 65.0 in | Wt 179.2 lb

## 2023-07-02 DIAGNOSIS — M79642 Pain in left hand: Secondary | ICD-10-CM | POA: Diagnosis not present

## 2023-07-02 DIAGNOSIS — M79641 Pain in right hand: Secondary | ICD-10-CM | POA: Diagnosis not present

## 2023-07-02 DIAGNOSIS — M85841 Other specified disorders of bone density and structure, right hand: Secondary | ICD-10-CM | POA: Diagnosis not present

## 2023-07-02 NOTE — Patient Instructions (Addendum)
We will call with X-ray results.  Can apply topical Volatren gel ( diclofenac gel) 4 times daily... wear glove  when working.  Use tylenol ES three times daily.  Can use carpal tunnel braces at night right hand.

## 2023-07-02 NOTE — Progress Notes (Signed)
Patient ID: Karla Patton, female    DOB: 1951-01-15, 72 y.o.   MRN: [id number]  This visit was conducted in person.  BP 134/66 (BP Location: Right Arm, Patient Position: Sitting, Cuff Size: Normal)   Pulse 75   Temp 98.2 F (36.8 C) (Temporal)   Ht 5\' 5"  (1.651 m)   Wt 179 lb 4 oz (81.3 kg)   SpO2 95%   BMI 29.83 kg/m    CC:  Chief Complaint  Patient presents with   Joint Pain    Bilateral Hands   Numbness    Bilateral Thumbs    Subjective:   HPI: Karla Patton is a 72 y.o. female who diabetes, hypertension, osteoarthritis presenting on 07/02/2023 for Joint Pain (Bilateral Hands) and Numbness (Bilateral Thumbs)    In 4 months worse in last month, she noted soreness in bones of MCP joints .[name] Started in right hand. Spread to left hand.  No redness, no swelling.  Decreased  grip strength, trouble opening cans. Occ numbness in right thumb, tingling in other finger tips first three. Long does it take to go to work it out   Stiffness in hands after an hour or 2   She uses hands at new job, grabbing items  and scanning... notes  worse since then.   She is using tylenol  and ibuprofen three times daily.        Relevant past medical, surgical, family and social history reviewed and updated as indicated. Interim medical history since our last visit reviewed. Allergies and medications reviewed and updated. Outpatient Medications Prior to Visit  Medication Sig Dispense Refill   acetaminophen (TYLENOL) 650 MG CR tablet Take 650 mg by mouth every 8 (eight) hours as needed for pain.     APPLE CIDER VINEGAR PO Take 600 mg by mouth daily.      aspirin EC 81 MG tablet Take 81 mg by mouth daily. Takes at bedtime     beta carotene 78295 UNIT capsule Take 25,000 Units by mouth daily.     Cholecalciferol (VITAMIN D3) 250 MCG (10000 UT) capsule Take 10,000 Units by mouth daily.     Continuous Glucose Sensor (DEXCOM G7 SENSOR) MISC by Does not apply route. Use to check  blood sugar continuous     Ferrous Sulfate Dried (HIGH POTENCY IRON) 65 MG TABS Take 1 tablet by mouth daily.     fluticasone (FLONASE) 50 MCG/ACT nasal spray SPRAY 2 SPRAYS INTO EACH NOSTRIL EVERY DAY 48 mL 1   furosemide (LASIX) 20 MG tablet TAKE 1 TABLET BY MOUTH DAILY 90 tablet 3   glucose blood (ACCU-CHEK GUIDE) test strip 1 each by Other route daily in the afternoon. Use as instructed 100 each 3   insulin aspart (NOVOLOG FLEXPEN) 100 UNIT/ML FlexPen MAX DAILY 30 UNITS 30 mL 3   insulin glargine (LANTUS SOLOSTAR) 100 UNIT/ML Solostar Pen Inject 12 Units into the skin daily. 15 mL 4   Insulin Pen Needle 32G X 4 MM MISC 1 Device by Does not apply route in the morning, at noon, in the evening, and at bedtime. 400 each 3   levothyroxine (SYNTHROID) 100 MCG tablet Take 1 tablet (100 mcg total) by mouth daily before breakfast. 90 tablet 3   MAGNESIUM-ZINC PO Take 1 tablet by mouth daily.     Multiple Vitamins-Minerals (ABC PLUS SENIOR ADULTS 50+ PO) Take 1 tablet by mouth daily.     omeprazole (PRILOSEC OTC) 20 MG tablet Take 20 mg by  mouth daily.     potassium chloride SA (KLOR-CON M) 20 MEQ tablet Take 1 tablet (20 mEq total) by mouth daily. Once daily along with lasix. 90 tablet 3   pregabalin (LYRICA) 225 MG capsule Take 1 capsule (225 mg total) by mouth at bedtime. 90 capsule 1   rosuvastatin (CRESTOR) 10 MG tablet Take 1 tablet (10 mg total) by mouth daily. 90 tablet 1   traMADol (ULTRAM) 50 MG tablet Take 50 mg by mouth every 6 (six) hours as needed.     Continuous Blood Gluc Sensor (DEXCOM G6 SENSOR) MISC UsE TO CHECK BLOOD SUGAR THROUGHOUT THE DAY REPLACE EVERY 14 DAYS 9 each 5   No facility-administered medications prior to visit.     Per HPI unless specifically indicated in ROS section below Review of Systems  Constitutional:  Negative for fatigue and fever.  HENT:  Negative for congestion.   Eyes:  Negative for pain.  Respiratory:  Negative for cough and shortness of breath.    Cardiovascular:  Negative for chest pain, palpitations and leg swelling.  Gastrointestinal:  Negative for abdominal pain.  Genitourinary:  Negative for dysuria and vaginal bleeding.  Musculoskeletal:  Negative for back pain.  Neurological:  Negative for syncope, light-headedness and headaches.  Psychiatric/Behavioral:  Negative for dysphoric mood.    Objective:  BP 134/66 (BP Location: Right Arm, Patient Position: Sitting, Cuff Size: Normal)   Pulse 75   Temp 98.2 F (36.8 C) (Temporal)   Ht 5\' 5"  (1.651 m)   Wt 179 lb 4 oz (81.3 kg)   SpO2 95%   BMI 29.83 kg/m   Wt Readings from Last 3 Encounters:  07/02/23 179 lb 4 oz (81.3 kg)  05/14/23 180 lb (81.6 kg)  04/27/23 177 lb 9.6 oz (80.6 kg)      Physical Exam Constitutional:      General: She is not in acute distress.    Appearance: Normal appearance. She is well-developed. She is not ill-appearing or toxic-appearing.  HENT:     Head: Normocephalic.     Right Ear: Hearing, tympanic membrane, ear canal and external ear normal. Tympanic membrane is not erythematous, retracted or bulging.     Left Ear: Hearing, tympanic membrane, ear canal and external ear normal. Tympanic membrane is not erythematous, retracted or bulging.     Nose: No mucosal edema or rhinorrhea.     Right Sinus: No maxillary sinus tenderness or frontal sinus tenderness.     Left Sinus: No maxillary sinus tenderness or frontal sinus tenderness.     Mouth/Throat:     Mouth: Oropharynx is clear and moist and mucous membranes are normal.     Pharynx: Uvula midline.  Eyes:     General: Lids are normal. Lids are everted, no foreign bodies appreciated.     Extraocular Movements: EOM normal.     Conjunctiva/sclera: Conjunctivae normal.     Pupils: Pupils are equal, round, and reactive to light.  Neck:     Thyroid: No thyroid mass or thyromegaly.     Vascular: No carotid bruit.     Trachea: Trachea normal.  Cardiovascular:     Rate and Rhythm: Normal rate and  regular rhythm.     Pulses: Normal pulses.     Heart sounds: Normal heart sounds, S1 normal and S2 normal. No murmur heard.    No friction rub. No gallop.  Pulmonary:     Effort: Pulmonary effort is normal. No tachypnea or respiratory distress.     Breath  sounds: Normal breath sounds. No decreased breath sounds, wheezing, rhonchi or rales.  Abdominal:     General: Bowel sounds are normal.     Palpations: Abdomen is soft.     Tenderness: There is no abdominal tenderness.  Musculoskeletal:     Right wrist: No tenderness or bony tenderness. Normal range of motion.     Left wrist: No tenderness or bony tenderness. Normal range of motion.     Right hand: Bony tenderness present. No swelling. Decreased range of motion.     Left hand: Bony tenderness present. No swelling. Decreased range of motion.     Cervical back: Normal range of motion and neck supple.     Comments: Some bony deformity at base of third finger bilaterally, this is the area of most tenderness Tinel and Phalen negative  Skin:    General: Skin is warm, dry and intact.     Findings: No rash.  Neurological:     Mental Status: She is alert.  Psychiatric:        Mood and Affect: Mood is not anxious or depressed.        Speech: Speech normal.        Behavior: Behavior normal. Behavior is cooperative.        Thought Content: Thought content normal.        Cognition and Memory: Cognition and memory normal.        Judgment: Judgment normal.       Results for orders placed or performed in visit on 05/14/23  POCT glycosylated hemoglobin (Hb A1C)  Result Value Ref Range   Hemoglobin A1C 7.3 (A) 4.0 - 5.6 %   HbA1c POC (<> result, manual entry)     HbA1c, POC (prediabetic range)     HbA1c, POC (controlled diabetic range)      Assessment and Plan  Bilateral hand pain Assessment & Plan: Chronic, her symptoms are most consistent with osteoarthritis, less likely rheumatoid arthritis.  Some symptoms correspond with likely mild  carpal tunnel. Will evaluate with bilateral hand films. Encouraged her to use topical diclofenac gel 4 times daily as needed for pain and inflammation to limit oral intake of NSAID.  She can continue using Tylenol extra strength 2 capsules 3 times daily as needed for pain. She can also start wearing carpal tunnel braces bilaterally at night.  Orders: -     DG Hand Complete Right; Future    No follow-ups on file.   Kerby Nora, MD

## 2023-07-02 NOTE — Assessment & Plan Note (Signed)
Chronic, her symptoms are most consistent with osteoarthritis, less likely rheumatoid arthritis.  Some symptoms correspond with likely mild carpal tunnel. Will evaluate with bilateral hand films. Encouraged her to use topical diclofenac gel 4 times daily as needed for pain and inflammation to limit oral intake of NSAID.  She can continue using Tylenol extra strength 2 capsules 3 times daily as needed for pain. She can also start wearing carpal tunnel braces bilaterally at night.

## 2023-07-31 ENCOUNTER — Telehealth: Payer: Self-pay | Admitting: Family Medicine

## 2023-07-31 DIAGNOSIS — R200 Anesthesia of skin: Secondary | ICD-10-CM | POA: Diagnosis not present

## 2023-07-31 DIAGNOSIS — R29898 Other symptoms and signs involving the musculoskeletal system: Secondary | ICD-10-CM | POA: Diagnosis not present

## 2023-07-31 DIAGNOSIS — M4722 Other spondylosis with radiculopathy, cervical region: Secondary | ICD-10-CM | POA: Diagnosis not present

## 2023-07-31 DIAGNOSIS — M542 Cervicalgia: Secondary | ICD-10-CM | POA: Diagnosis not present

## 2023-07-31 NOTE — Telephone Encounter (Signed)
No concerns about transfer of care, okay to proceed from my viewpoint.

## 2023-07-31 NOTE — Telephone Encounter (Signed)
Pt would like do a TOC from [facility] to [facility] because she want something closer to home

## 2023-08-05 NOTE — Telephone Encounter (Signed)
LVMTCB

## 2023-08-06 ENCOUNTER — Ambulatory Visit (INDEPENDENT_AMBULATORY_CARE_PROVIDER_SITE_OTHER): Payer: Medicare Other | Admitting: Nurse Practitioner

## 2023-08-06 ENCOUNTER — Encounter: Payer: Self-pay | Admitting: Nurse Practitioner

## 2023-08-06 VITALS — BP 138/80 | HR 74 | Temp 98.6°F | Ht 65.0 in | Wt 177.8 lb

## 2023-08-06 DIAGNOSIS — K219 Gastro-esophageal reflux disease without esophagitis: Secondary | ICD-10-CM | POA: Diagnosis not present

## 2023-08-06 DIAGNOSIS — R1011 Right upper quadrant pain: Secondary | ICD-10-CM

## 2023-08-06 DIAGNOSIS — Z1231 Encounter for screening mammogram for malignant neoplasm of breast: Secondary | ICD-10-CM | POA: Diagnosis not present

## 2023-08-06 DIAGNOSIS — E039 Hypothyroidism, unspecified: Secondary | ICD-10-CM

## 2023-08-06 DIAGNOSIS — E1069 Type 1 diabetes mellitus with other specified complication: Secondary | ICD-10-CM

## 2023-08-06 DIAGNOSIS — E103293 Type 1 diabetes mellitus with mild nonproliferative diabetic retinopathy without macular edema, bilateral: Secondary | ICD-10-CM | POA: Diagnosis not present

## 2023-08-06 DIAGNOSIS — E1159 Type 2 diabetes mellitus with other circulatory complications: Secondary | ICD-10-CM

## 2023-08-06 DIAGNOSIS — E785 Hyperlipidemia, unspecified: Secondary | ICD-10-CM

## 2023-08-06 DIAGNOSIS — I152 Hypertension secondary to endocrine disorders: Secondary | ICD-10-CM

## 2023-08-06 DIAGNOSIS — E1043 Type 1 diabetes mellitus with diabetic autonomic (poly)neuropathy: Secondary | ICD-10-CM

## 2023-08-06 NOTE — Patient Instructions (Signed)
YOUR MAMMOGRAM IS DUE, PLEASE CALL AND GET THIS SCHEDULED! [facility] - call [phone]    

## 2023-08-06 NOTE — Progress Notes (Signed)
Karla Dicker, NP-C Phone: [phone]  Karla Patton is a 72 y.o. female who presents today for transfer of care.   Patient with right upper quadrant abdominal pain for several days. The pain is worse after eating and drinking certain foods. She does not have a gallbladder. She denies nausea or vomiting.   HYPERTENSION Disease Monitoring: Blood pressure range- Not checking Chest pain- No      Dyspnea- No Medications: Compliance- None Lightheadedness- No   Edema- No  Lab Results  Component Value Date   NA 129 (L) 08/12/2023   K 4.5 08/12/2023   CO2 29 08/12/2023   GLUCOSE 98 08/12/2023   BUN 13 08/12/2023   CREATININE 0.77 08/12/2023   CALCIUM 9.1 08/12/2023   EGFR 94 01/27/2022   GFRNONAA >60 03/19/2021    DIABETES- Managed by Endocrinology Disease Monitoring: Blood Sugar ranges- Dexcom 14 day average- 201  Polyuria/phagia/dipsia- No      Optho- Yes Medications: Compliance- Lantus and Novolog Hypoglycemic symptoms- Occasionally late at night  Lab Results  Component Value Date   HGBA1C 7.3 (A) 05/14/2023     HYPERLIPIDEMIA Disease Monitoring: See symptoms for Hypertension Medications: Compliance- Crestor Right upper quadrant pain- No  Muscle aches- No  Lab Results  Component Value Date   CHOL 159 09/03/2022   HDL 65.00 09/03/2022   LDLCALC 78 09/03/2022   TRIG 80.0 09/03/2022   CHOLHDL 2 09/03/2022    HYPOTHYROIDISM- Managed by Endocrinology Disease Monitoring Weight changes: No  Skin Changes: No Palpitations: No Heat/Cold intolerance: No Medication Monitoring Compliance:  Levothyroxine   Last TSH:   Lab Results  Component Value Date   TSH 0.97 03/12/2023   GERD:   Reflux symptoms: None   Abd pain: Right upper quadrant   Blood in stool: No  Dysphagia: No   EGD: 30+ years ago  Medication: Omeprazole   Social History   Tobacco Use  Smoking Status Never  Smokeless Tobacco Never    Current Outpatient Medications on File Prior to Visit   Medication Sig Dispense Refill   acetaminophen (TYLENOL) 650 MG CR tablet Take 650 mg by mouth every 8 (eight) hours as needed for pain.     APPLE CIDER VINEGAR PO Take 600 mg by mouth daily.      aspirin EC 81 MG tablet Take 81 mg by mouth daily. Takes at bedtime     beta carotene 09811 UNIT capsule Take 25,000 Units by mouth daily.     Cholecalciferol (VITAMIN D3) 250 MCG (10000 UT) capsule Take 10,000 Units by mouth daily.     Continuous Glucose Sensor (DEXCOM G7 SENSOR) MISC by Does not apply route. Use to check blood sugar continuous     Ferrous Sulfate Dried (HIGH POTENCY IRON) 65 MG TABS Take 1 tablet by mouth daily.     fluticasone (FLONASE) 50 MCG/ACT nasal spray SPRAY 2 SPRAYS INTO EACH NOSTRIL EVERY DAY 48 mL 1   glucose blood (ACCU-CHEK GUIDE) test strip 1 each by Other route daily in the afternoon. Use as instructed 100 each 3   insulin aspart (NOVOLOG FLEXPEN) 100 UNIT/ML FlexPen MAX DAILY 30 UNITS 30 mL 3   insulin glargine (LANTUS SOLOSTAR) 100 UNIT/ML Solostar Pen Inject 12 Units into the skin daily. 15 mL 4   Insulin Pen Needle 32G X 4 MM MISC 1 Device by Does not apply route in the morning, at noon, in the evening, and at bedtime. 400 each 3   levothyroxine (SYNTHROID) 100 MCG tablet Take 1 tablet (  100 mcg total) by mouth daily before breakfast. 90 tablet 3   MAGNESIUM-ZINC PO Take 1 tablet by mouth daily.     Multiple Vitamins-Minerals (ABC PLUS SENIOR ADULTS 50+ PO) Take 1 tablet by mouth daily.     omeprazole (PRILOSEC OTC) 20 MG tablet Take 20 mg by mouth daily.     pregabalin (LYRICA) 225 MG capsule Take 1 capsule (225 mg total) by mouth at bedtime. 90 capsule 1   traMADol (ULTRAM) 50 MG tablet Take 50 mg by mouth every 6 (six) hours as needed.     No current facility-administered medications on file prior to visit.     ROS see history of present illness  Objective  Physical Exam Vitals:   08/06/23 1413  BP: 138/80  Pulse: 74  Temp: 98.6 F (37 C)  SpO2:  97%    BP Readings from Last 3 Encounters:  08/06/23 138/80  07/02/23 134/66  05/14/23 136/84   Wt Readings from Last 3 Encounters:  08/06/23 177 lb 12.8 oz (80.6 kg)  07/02/23 179 lb 4 oz (81.3 kg)  05/14/23 180 lb (81.6 kg)    Physical Exam Constitutional:      General: She is not in acute distress.    Appearance: Normal appearance.  HENT:     Head: Normocephalic.  Cardiovascular:     Rate and Rhythm: Normal rate and regular rhythm.     Heart sounds: Murmur heard.  Pulmonary:     Effort: Pulmonary effort is normal.     Breath sounds: Normal breath sounds.  Abdominal:     General: Abdomen is flat. Bowel sounds are normal.     Palpations: Abdomen is soft.     Tenderness: There is abdominal tenderness (right upper quadrant).  Skin:    General: Skin is warm and dry.  Neurological:     General: No focal deficit present.     Mental Status: She is alert.  Psychiatric:        Mood and Affect: Mood normal.        Behavior: Behavior normal.    Assessment/Plan: Please see individual problem list.  Right upper quadrant abdominal pain Assessment & Plan: Only after eating and drinking certain foods. Will check lab work as outlined. Encouraged to keep food diary/journal of what is causing symptoms to help identify trigger. Return precautions given to patient, she will contact if worsening or changing.   Orders: -     Basic metabolic panel -     Hepatic function panel -     Lipase  Hypertension associated with diabetes (HCC) Assessment & Plan: Chronic. Above goal. Not currently on any medications. Advised to start checking her blood pressure daily at home and keep a log to bring to her next appointment. Will continue to monitor.    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 Followed by Endocrinology. Dexcom 14 day average blood sugar elevated at 201. She will continue her Lanus and  Novolog as directed. Encouraged to follow up with Endocrinology as scheduled.    Hyperlipidemia due to type 1 diabetes mellitus (HCC) Assessment & Plan: Chronic. Stable on Crestor 10 mg daily. Continue.    Hypothyroidism, unspecified type Assessment & Plan: Chronic. Stable on Levothyroxine 100 mcg daily. Continue. Follow up with Endocrinology as scheduled.    Gastroesophageal reflux disease without esophagitis Assessment & Plan: Chronic. Stable on Omeprazole 20 mg daily. Continue. Encouraged to monitor diet for triggers,  such as avoiding spicy and acidic foods and decreasing caffeine and alcohol intake. Dietary information provided to patient. Will continue to monitor.    Screening mammogram for breast cancer -     3D Screening Mammogram, Left and Right; Future   Return in about 6 months (around 02/06/2024) for Follow up.   Karla Dicker, NP-C [facility] - [employer]

## 2023-08-07 LAB — BASIC METABOLIC PANEL
BUN: 10 mg/dL (ref 6–23)
CO2: 31 meq/L (ref 19–32)
Calcium: 9.4 mg/dL (ref 8.4–10.5)
Chloride: 85 meq/L — ABNORMAL LOW (ref 96–112)
Creatinine, Ser: 0.72 mg/dL (ref 0.40–1.20)
GFR: 84.48 mL/min (ref >60.00)
Glucose, Bld: 282 mg/dL — ABNORMAL HIGH (ref 70–99)
Potassium: 4.4 meq/L (ref 3.5–5.1)
Sodium: 123 meq/L — ABNORMAL LOW (ref 135–145)

## 2023-08-07 LAB — HEPATIC FUNCTION PANEL
ALT: 22 U/L (ref 0–35)
AST: 25 U/L (ref 0–37)
Albumin: 4.4 g/dL (ref 3.5–5.2)
Alkaline Phosphatase: 96 U/L (ref 39–117)
Bilirubin, Direct: 0.1 mg/dL (ref 0.0–0.3)
Total Bilirubin: 0.4 mg/dL (ref 0.2–1.2)
Total Protein: 6.9 g/dL (ref 6.0–8.3)

## 2023-08-07 LAB — LIPASE: Lipase: 9 U/L — ABNORMAL LOW (ref 11.0–59.0)

## 2023-08-08 ENCOUNTER — Encounter: Payer: Self-pay | Admitting: Nurse Practitioner

## 2023-08-08 DIAGNOSIS — E1159 Type 2 diabetes mellitus with other circulatory complications: Secondary | ICD-10-CM

## 2023-08-08 DIAGNOSIS — R600 Localized edema: Secondary | ICD-10-CM

## 2023-08-08 DIAGNOSIS — E1069 Type 1 diabetes mellitus with other specified complication: Secondary | ICD-10-CM

## 2023-08-10 MED ORDER — ROSUVASTATIN CALCIUM 10 MG PO TABS
10.0000 mg | ORAL_TABLET | Freq: Every day | ORAL | 1 refills | Status: DC
Start: 2023-08-10 — End: 2024-01-28

## 2023-08-10 MED ORDER — FUROSEMIDE 20 MG PO TABS: ORAL_TABLET | ORAL | 3 refills | Status: DC

## 2023-08-10 MED ORDER — POTASSIUM CHLORIDE CRYS ER 20 MEQ PO TBCR: 20.0000 meq | EXTENDED_RELEASE_TABLET | Freq: Every day | ORAL | 3 refills | Status: DC

## 2023-08-11 ENCOUNTER — Telehealth: Payer: Self-pay

## 2023-08-11 NOTE — Telephone Encounter (Signed)
LMOM for pt to CB in regards to labs 

## 2023-08-11 NOTE — Telephone Encounter (Signed)
Called and Left 2nd VM to CB

## 2023-08-12 ENCOUNTER — Other Ambulatory Visit (INDEPENDENT_AMBULATORY_CARE_PROVIDER_SITE_OTHER): Payer: Medicare Other

## 2023-08-12 ENCOUNTER — Other Ambulatory Visit: Payer: Self-pay | Admitting: Nurse Practitioner

## 2023-08-12 DIAGNOSIS — E871 Hypo-osmolality and hyponatremia: Secondary | ICD-10-CM | POA: Diagnosis not present

## 2023-08-13 LAB — BASIC METABOLIC PANEL
BUN: 13 mg/dL (ref 6–23)
CO2: 29 meq/L (ref 19–32)
Calcium: 9.1 mg/dL (ref 8.4–10.5)
Chloride: 90 mEq/L — ABNORMAL LOW (ref 96–112)
Creatinine, Ser: 0.77 mg/dL (ref 0.40–1.20)
GFR: 77.93 mL/min (ref >60.00)
Glucose, Bld: 98 mg/dL (ref 70–99)
Potassium: 4.5 meq/L (ref 3.5–5.1)
Sodium: 129 meq/L — ABNORMAL LOW (ref 135–145)

## 2023-08-18 ENCOUNTER — Encounter: Payer: Self-pay | Admitting: Nurse Practitioner

## 2023-08-18 ENCOUNTER — Other Ambulatory Visit: Payer: Self-pay | Admitting: Nurse Practitioner

## 2023-08-18 DIAGNOSIS — R1011 Right upper quadrant pain: Secondary | ICD-10-CM

## 2023-08-18 NOTE — Assessment & Plan Note (Signed)
Chronic. Stable on Omeprazole 20 mg daily. Continue. Encouraged to monitor diet for triggers, such as avoiding spicy and acidic foods and decreasing caffeine and alcohol intake. Dietary information provided to patient. Will continue to monitor.

## 2023-08-18 NOTE — Assessment & Plan Note (Signed)
Chronic. Followed by Endocrinology. Dexcom 14 day average blood sugar elevated at 201. She will continue her Lanus and Novolog as directed. Encouraged to follow up with Endocrinology as scheduled.

## 2023-08-18 NOTE — Assessment & Plan Note (Signed)
Only after eating and drinking certain foods. Will check lab work as outlined. Encouraged to keep food diary/journal of what is causing symptoms to help identify trigger. Return precautions given to patient, she will contact if worsening or changing.

## 2023-08-18 NOTE — Assessment & Plan Note (Signed)
Chronic. Stable on Crestor 10 mg daily. Continue.

## 2023-08-18 NOTE — Assessment & Plan Note (Addendum)
Chronic. Stable on Levothyroxine 100 mcg daily. Continue. Follow up with Endocrinology as scheduled.

## 2023-08-18 NOTE — Assessment & Plan Note (Signed)
Chronic. Above goal. Not currently on any medications. Advised to start checking her blood pressure daily at home and keep a log to bring to her next appointment. Will continue to monitor.

## 2023-08-28 ENCOUNTER — Ambulatory Visit: Payer: Medicare Other | Admitting: Internal Medicine

## 2023-08-28 ENCOUNTER — Telehealth: Payer: Self-pay

## 2023-08-28 NOTE — Telephone Encounter (Signed)
I spoke to [facility] and she is aware that her patient assistance is here #1 box of Lantus is here and ready to be picked up

## 2023-09-01 ENCOUNTER — Ambulatory Visit
Admission: RE | Admit: 2023-09-01 | Discharge: 2023-09-01 | Disposition: A | Discharge status: Home / Self Care | Payer: Medicare Other | Source: Ambulatory Visit | Attending: Nurse Practitioner | Admitting: Nurse Practitioner

## 2023-09-01 DIAGNOSIS — Z1231 Encounter for screening mammogram for malignant neoplasm of breast: Secondary | ICD-10-CM | POA: Diagnosis not present

## 2023-09-02 ENCOUNTER — Ambulatory Visit
Admission: RE | Admit: 2023-09-02 | Discharge: 2023-09-02 | Disposition: A | Discharge status: Home / Self Care | Payer: Medicare Other | Source: Ambulatory Visit | Attending: Nurse Practitioner | Admitting: Nurse Practitioner

## 2023-09-02 DIAGNOSIS — R1011 Right upper quadrant pain: Secondary | ICD-10-CM | POA: Insufficient documentation

## 2023-09-02 DIAGNOSIS — K769 Liver disease, unspecified: Secondary | ICD-10-CM | POA: Diagnosis not present

## 2023-09-02 DIAGNOSIS — Z23 Encounter for immunization: Secondary | ICD-10-CM | POA: Diagnosis not present

## 2023-09-09 ENCOUNTER — Other Ambulatory Visit: Payer: Self-pay

## 2023-09-09 ENCOUNTER — Encounter: Payer: Self-pay | Admitting: Internal Medicine

## 2023-09-09 MED ORDER — DEXCOM G7 SENSOR MISC: 3 refills | Status: DC

## 2023-09-10 ENCOUNTER — Telehealth: Payer: Self-pay | Admitting: Pharmacy Technician

## 2023-09-10 ENCOUNTER — Other Ambulatory Visit (HOSPITAL_COMMUNITY): Payer: Self-pay

## 2023-09-10 NOTE — Telephone Encounter (Signed)
error 

## 2023-09-10 NOTE — Telephone Encounter (Signed)
Received fax from [facility] that Dexcom isn't covered by pts ins. She got it once from [facility] for a 3 month supply, but she is using her last sensor. Can you resend the RX to [facility], with the DX code on it and tell them to bill Part B, not Pharmacy benefit? She is also saying there is an issue with [facility] supplying it going forward. Can you follow up with them to get that straightened out for her to get her supplies going forward?

## 2023-09-11 MED ORDER — DEXCOM G7 SENSOR MISC: 3 refills | Status: DC

## 2023-09-11 NOTE — Telephone Encounter (Signed)
Patient aware that script has been sent and would not like to you [facility] any further. Will notify us if [facility] is not able to process

## 2023-09-15 ENCOUNTER — Other Ambulatory Visit: Payer: Self-pay

## 2023-09-18 ENCOUNTER — Encounter: Payer: Medicare Other | Admitting: Nurse Practitioner

## 2023-09-22 DIAGNOSIS — M720 Palmar fascial fibromatosis [Dupuytren]: Secondary | ICD-10-CM | POA: Insufficient documentation

## 2023-09-22 DIAGNOSIS — G5601 Carpal tunnel syndrome, right upper limb: Secondary | ICD-10-CM | POA: Diagnosis not present

## 2023-09-22 DIAGNOSIS — M65331 Trigger finger, right middle finger: Secondary | ICD-10-CM | POA: Diagnosis not present

## 2023-09-24 ENCOUNTER — Ambulatory Visit: Payer: Medicare Other | Admitting: Internal Medicine

## 2023-09-24 NOTE — Telephone Encounter (Signed)
Patient came in to office today and picked up 2 samples of Dexcom G7 sensors.

## 2023-09-24 NOTE — Telephone Encounter (Signed)
Patient came in to office today and picked up 4 boxes of Novolog and 2 boxes of NovoFine Pen Needles.  Both were patient assistance.

## 2023-09-28 ENCOUNTER — Encounter: Payer: Self-pay | Admitting: Nurse Practitioner

## 2023-09-29 ENCOUNTER — Ambulatory Visit: Payer: Medicare Other | Admitting: Nurse Practitioner

## 2023-09-29 ENCOUNTER — Encounter: Payer: Self-pay | Admitting: Nurse Practitioner

## 2023-09-29 VITALS — BP 142/78 | HR 60 | Temp 97.6°F | Ht 65.0 in | Wt 174.6 lb

## 2023-09-29 DIAGNOSIS — R42 Dizziness and giddiness: Secondary | ICD-10-CM | POA: Diagnosis not present

## 2023-09-29 MED ORDER — MECLIZINE HCL 12.5 MG PO TABS
12.5000 mg | ORAL_TABLET | Freq: Three times a day (TID) | ORAL | 0 refills | Status: DC | PRN
Start: 2023-09-29 — End: 2023-10-28

## 2023-09-29 NOTE — Progress Notes (Unsigned)
Established Patient Office Visit  Subjective:  Patient ID: Karla Patton, female    DOB: October 18, 1951  Age: 72 y.o. MRN: [id number]  CC:  Chief Complaint  Patient presents with   Acute Visit    Dizziness     HPI  Karla Patton presents for dizziness that started yesterday morning. She states had one episode of  palpitation yesterday resolved since them.   Dizziness This is a chronic problem. The current episode started yesterday. The problem occurs constantly. The problem has been unchanged. Associated symptoms include nausea and vertigo. Pertinent negatives include no abdominal pain, change in bowel habit, congestion, coughing, headaches, numbness, rash, visual change or vomiting. The symptoms are aggravated by twisting, bending and walking. She has tried nothing for the symptoms.  [name] double vision, weakness of one side.   Past Medical History:  Diagnosis Date   Allergy 11/2021   Lisinolpril-cough   Anemia    Anxiety    Arthritis    Back pain    Diabetes mellitus without complication (HCC)    type 1   Emphysema of lung (HCC)    GERD (gastroesophageal reflux disease)    Heart murmur    Hypertension associated with diabetes (HCC) 03/03/2018   Hypothyroidism    Thyroid disease    Varicose vein     Past Surgical History:  Procedure Laterality Date   ABDOMINAL HYSTERECTOMY  1994   CHOLECYSTECTOMY  09/07/2013   COLONOSCOPY  2009   normal   COLONOSCOPY WITH PROPOFOL N/A 10/20/2018   Procedure: COLONOSCOPY WITH PROPOFOL;  Surgeon: Earline Mayotte, MD;  Location: [facility] ENDOSCOPY;  Service: Endoscopy;  Laterality: N/A;   INCONTINENCE SURGERY  2003   left leg vein ligation  2015   SPINE SURGERY  03-18-2021   VARICOSE VEIN SURGERY  1990's   Dr Lemar Livings   VEIN SURGERY Left 11/17/2017   vein closure procedure/Dr Evette Cristal    Family History  Problem Relation Age of Onset   Diabetes Mother    Pancreatitis Mother    Heart disease Father    Stroke Father         x 2   Hypertension Father    Varicose Veins Father    Fibromyalgia Sister    Scoliosis Sister    Arthritis Sister    Diabetes Sister    Breast cancer Neg Hx     Social History   Socioeconomic History   Marital status: Media planner    Spouse name: Not on file   Number of children: 2   Years of education: 12   Highest education level: High school graduate  Occupational History   Occupation: Retired  Tobacco Use   Smoking status: Never   Smokeless tobacco: Never  Vaping Use   Vaping status: Never Used  Substance and Sexual Activity   Alcohol use: Yes    Alcohol/week: 9.0 standard drinks of alcohol    Types: 7 Glasses of wine, 2 Shots of liquor per week    Comment: vodka 1 drink nightly  and/ or wine   Drug use: No   Sexual activity: Not Currently    Partners: Male    Birth control/protection: Surgical, None  Other Topics Concern   Not on file  Social History Narrative   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   2 children Sons (1977, [id number])   Grandchildren 3 blood related   5 step-grandchildren    Enjoys going to [employer] and reading   No pets  Social Determinants of Health   Financial Resource Strain: Medium Risk (02/11/2023)   Overall Financial Resource Strain (CARDIA)    Difficulty of Paying Living Expenses: Somewhat hard  Food Insecurity: No Food Insecurity (02/11/2023)   Hunger Vital Sign    Worried About Running Out of Food in the Last Year: Never true    Ran Out of Food in the Last Year: Never true  Transportation Needs: No Transportation Needs (02/11/2023)   PRAPARE - Administrator, Civil Service (Medical): No    Lack of Transportation (Non-Medical): No  Physical Activity: Sufficiently Active (02/11/2023)   Exercise Vital Sign    Days of Exercise per Week: 7 days    Minutes of Exercise per Session: 60 min  Stress: No Stress Concern Present (02/11/2023)   [employer] of Occupational Health - Occupational Stress Questionnaire     Feeling of Stress : Not at all  Social Connections: Moderately Isolated (02/11/2023)   Social Connection and Isolation Panel [NHANES]    Frequency of Communication with Friends and Family: More than three times a week    Frequency of Social Gatherings with Friends and Family: More than three times a week    Attends Religious Services: Never    Database administrator or Organizations: No    Attends Banker Meetings: Never    Marital Status: Living with partner  Intimate Partner Violence: Not At Risk (02/11/2023)   Humiliation, Afraid, Rape, and Kick questionnaire    Fear of Current or Ex-Partner: No    Emotionally Abused: No    Physically Abused: No    Sexually Abused: No     Outpatient Medications Prior to Visit  Medication Sig Dispense Refill   acetaminophen (TYLENOL) 650 MG CR tablet Take 650 mg by mouth every 8 (eight) hours as needed for pain.     APPLE CIDER VINEGAR PO Take 600 mg by mouth daily.      aspirin EC 81 MG tablet Take 81 mg by mouth daily. Takes at bedtime     beta carotene 62952 UNIT capsule Take 25,000 Units by mouth daily.     Cholecalciferol (VITAMIN D3) 250 MCG (10000 UT) capsule Take 10,000 Units by mouth daily.     Continuous Glucose Sensor (DEXCOM G7 SENSOR) MISC Change sensors every 10 days 3 each 3   Ferrous Sulfate Dried (HIGH POTENCY IRON) 65 MG TABS Take 1 tablet by mouth daily.     fluticasone (FLONASE) 50 MCG/ACT nasal spray SPRAY 2 SPRAYS INTO EACH NOSTRIL EVERY DAY 48 mL 1   furosemide (LASIX) 20 MG tablet TAKE 1 TABLET BY MOUTH DAILY 90 tablet 3   glucose blood (ACCU-CHEK GUIDE) test strip 1 each by Other route daily in the afternoon. Use as instructed 100 each 3   insulin aspart (NOVOLOG FLEXPEN) 100 UNIT/ML FlexPen MAX DAILY 30 UNITS 30 mL 3   insulin glargine (LANTUS SOLOSTAR) 100 UNIT/ML Solostar Pen Inject 12 Units into the skin daily. 15 mL 4   Insulin Pen Needle 32G X 4 MM MISC 1 Device by Does not apply route in the morning, at  noon, in the evening, and at bedtime. 400 each 3   levothyroxine (SYNTHROID) 100 MCG tablet Take 1 tablet (100 mcg total) by mouth daily before breakfast. 90 tablet 3   MAGNESIUM-ZINC PO Take 1 tablet by mouth daily.     Multiple Vitamins-Minerals (ABC PLUS SENIOR ADULTS 50+ PO) Take 1 tablet by mouth daily.     omeprazole (  PRILOSEC OTC) 20 MG tablet Take 20 mg by mouth daily.     potassium chloride SA (KLOR-CON M) 20 MEQ tablet Take 1 tablet (20 mEq total) by mouth daily. Once daily along with lasix. 90 tablet 3   pregabalin (LYRICA) 225 MG capsule Take 1 capsule (225 mg total) by mouth at bedtime. 90 capsule 1   rosuvastatin (CRESTOR) 10 MG tablet Take 1 tablet (10 mg total) by mouth daily. 90 tablet 1   traMADol (ULTRAM) 50 MG tablet Take 50 mg by mouth every 6 (six) hours as needed.     No facility-administered medications prior to visit.    Allergies  Allergen Reactions   Lisinopril Cough    ROS Review of Systems  HENT:  Negative for congestion.   Respiratory:  Negative for cough.   Gastrointestinal:  Positive for nausea. Negative for abdominal pain, change in bowel habit and vomiting.  Skin:  Negative for rash.  Neurological:  Positive for dizziness and vertigo. Negative for numbness and headaches.   Negative unless indicated in HPI.    Objective:    Physical Exam HENT:     Head: Normocephalic.     Right Ear: Tympanic membrane normal.     Left Ear: Tympanic membrane normal.     Mouth/Throat:     Mouth: Mucous membranes are moist.  Eyes:     Pupils: Pupils are equal, round, and reactive to light.  Cardiovascular:     Rate and Rhythm: Normal rate. Rhythm irregular.  Pulmonary:     Effort: Pulmonary effort is normal.     Breath sounds: Normal breath sounds.  Musculoskeletal:     Cervical back: Normal range of motion.  Skin:    General: Skin is warm.  Neurological:     Mental Status: She is alert.     Comments: Dizziness with changing position   Psychiatric:         Mood and Affect: Mood normal.        Behavior: Behavior normal.     BP (!) 142/78 (BP Location: Left Arm, Patient Position: Standing, Cuff Size: Normal)   Pulse 60   Temp 97.6 F (36.4 C)   Ht 5\' 5"  (1.651 m)   Wt 174 lb 9.6 oz (79.2 kg)   SpO2 97%   BMI 29.05 kg/m  Wt Readings from Last 3 Encounters:  09/29/23 174 lb 9.6 oz (79.2 kg)  08/06/23 177 lb 12.8 oz (80.6 kg)  07/02/23 179 lb 4 oz (81.3 kg)     Health Maintenance  Topic Date Due   Diabetic kidney evaluation - Urine ACR  11/13/2023   HEMOGLOBIN A1C  11/14/2023   COVID-19 Vaccine (6 - 2023-24 season) 01/02/2024   OPHTHALMOLOGY EXAM  02/07/2024   Medicare Annual Wellness (AWV)  02/12/2024   FOOT EXAM  05/13/2024   Diabetic kidney evaluation - eGFR measurement  08/11/2024   MAMMOGRAM  08/31/2024   DTaP/Tdap/Td (2 - Td or Tdap) 11/26/2026   DEXA SCAN  07/24/2027   Colonoscopy  10/20/2028   Pneumonia Vaccine 86+ Years old  Completed   INFLUENZA VACCINE  Completed   Hepatitis C Screening  Completed   Zoster Vaccines- Shingrix  Completed   HPV VACCINES  Aged Out    There are no preventive care reminders to display for this patient.  Lab Results  Component Value Date   TSH 0.97 03/12/2023   Lab Results  Component Value Date   WBC 5.3 03/12/2023   HGB 11.6 (L) 03/12/2023  HCT 35.2 (L) 03/12/2023   MCV 83.5 03/12/2023   PLT 432.0 (H) 03/12/2023   Lab Results  Component Value Date   NA 129 (L) 08/12/2023   K 4.5 08/12/2023   CO2 29 08/12/2023   GLUCOSE 98 08/12/2023   BUN 13 08/12/2023   CREATININE 0.77 08/12/2023   BILITOT 0.4 08/06/2023   ALKPHOS 96 08/06/2023   AST 25 08/06/2023   ALT 22 08/06/2023   PROT 6.9 08/06/2023   ALBUMIN 4.4 08/06/2023   CALCIUM 9.1 08/12/2023   ANIONGAP 7 03/19/2021   EGFR 94 01/27/2022   GFR 77.93 08/12/2023   Lab Results  Component Value Date   CHOL 159 09/03/2022   Lab Results  Component Value Date   HDL 65.00 09/03/2022   Lab Results  Component  Value Date   LDLCALC 78 09/03/2022   Lab Results  Component Value Date   TRIG 80.0 09/03/2022   Lab Results  Component Value Date   CHOLHDL 2 09/03/2022   Lab Results  Component Value Date   HGBA1C 7.3 (A) 05/14/2023      Assessment & Plan:  Dizziness Assessment & Plan: Symptoms consistent with vertigo. EKG no acute changes. Will start on Meclizine. Will refer to ENT for further evaluation.   Orders: -     EKG 12-Lead -     Ambulatory referral to ENT -     Meclizine HCl; Take 1 tablet (12.5 mg total) by mouth 3 (three) times daily as needed for dizziness.  Dispense: 30 tablet; Refill: 0 -     Basic metabolic panel -     CBC    Follow-up: Return if symptoms worsen or fail to improve.   Kara Dies, NP

## 2023-09-29 NOTE — Telephone Encounter (Signed)
Patient called and appointment was made. 

## 2023-09-29 NOTE — Patient Instructions (Signed)
Please go to the lab. Have send meclizine 12.5 mg to the pharamcy. Referral sent to ENT.

## 2023-09-29 NOTE — Assessment & Plan Note (Signed)
Symptoms consistent with vertigo. EKG no acute changes. Will start on Meclizine. Will refer to ENT for further evaluation.

## 2023-09-30 ENCOUNTER — Telehealth: Payer: Self-pay | Admitting: Nurse Practitioner

## 2023-09-30 LAB — BASIC METABOLIC PANEL
BUN: 14 mg/dL (ref 6–23)
CO2: 31 meq/L (ref 19–32)
Calcium: 9.6 mg/dL (ref 8.4–10.5)
Chloride: 90 meq/L — ABNORMAL LOW (ref 96–112)
Creatinine, Ser: 0.72 mg/dL (ref 0.40–1.20)
GFR: 84.39 mL/min (ref >60.00)
Glucose, Bld: 349 mg/dL — ABNORMAL HIGH (ref 70–99)
Potassium: 4.3 meq/L (ref 3.5–5.1)
Sodium: 129 meq/L — ABNORMAL LOW (ref 135–145)

## 2023-09-30 LAB — CBC
HCT: 36.2 % (ref 36.0–46.0)
Hemoglobin: 11.9 g/dL — ABNORMAL LOW (ref 12.0–15.0)
MCHC: 33 g/dL (ref 30.0–36.0)
MCV: 88.9 fL (ref 78.0–100.0)
Platelets: 355 10*3/uL (ref 150.0–400.0)
RBC: 4.07 Mil/uL (ref 3.87–5.11)
RDW: 13.4 % (ref 11.5–15.5)
WBC: 7.5 10*3/uL (ref 4.0–10.5)

## 2023-10-01 NOTE — Telephone Encounter (Signed)
Left message to call the office back regarding how she is doing and if she was able to get an appointment with ENT?

## 2023-10-01 NOTE — Telephone Encounter (Signed)
Patient is scheduled with Dr. Jenne Campus on 10/02/23 at 1:30. Patient received her lab results in my chart and would like to know if the low level labs could be causing what is going on with her and what she could do to help to get straightened back out.

## 2023-10-01 NOTE — Telephone Encounter (Signed)
Spoke to Patient she is still a little dizzy but more unsteady on her feet. Patient had a funeral to go to yesterday so she did not get to call ENT and schedule. I will try to call and get her scheduled.

## 2023-10-02 DIAGNOSIS — H9313 Tinnitus, bilateral: Secondary | ICD-10-CM | POA: Diagnosis not present

## 2023-10-02 DIAGNOSIS — H903 Sensorineural hearing loss, bilateral: Secondary | ICD-10-CM | POA: Diagnosis not present

## 2023-10-02 DIAGNOSIS — R42 Dizziness and giddiness: Secondary | ICD-10-CM | POA: Diagnosis not present

## 2023-10-06 DIAGNOSIS — R42 Dizziness and giddiness: Secondary | ICD-10-CM | POA: Diagnosis not present

## 2023-10-09 ENCOUNTER — Emergency Department: Payer: Medicare Other

## 2023-10-09 ENCOUNTER — Other Ambulatory Visit: Payer: Self-pay

## 2023-10-09 ENCOUNTER — Ambulatory Visit
Admission: EM | Admit: 2023-10-09 | Discharge: 2023-10-09 | Disposition: A | Discharge status: Home / Self Care | Payer: Medicare Other | Attending: Family Medicine | Admitting: Family Medicine

## 2023-10-09 ENCOUNTER — Encounter: Payer: Self-pay | Admitting: Intensive Care

## 2023-10-09 ENCOUNTER — Encounter: Payer: Self-pay | Admitting: Emergency Medicine

## 2023-10-09 ENCOUNTER — Inpatient Hospital Stay
Admission: EM | Admit: 2023-10-09 | Discharge: 2023-10-12 | DRG: 641 | Disposition: A | Discharge status: Home / Self Care | Payer: Medicare Other | Source: Ambulatory Visit | Attending: Internal Medicine | Admitting: Internal Medicine

## 2023-10-09 DIAGNOSIS — R10813 Right lower quadrant abdominal tenderness: Secondary | ICD-10-CM | POA: Diagnosis not present

## 2023-10-09 DIAGNOSIS — Z981 Arthrodesis status: Secondary | ICD-10-CM | POA: Diagnosis not present

## 2023-10-09 DIAGNOSIS — Z9049 Acquired absence of other specified parts of digestive tract: Secondary | ICD-10-CM

## 2023-10-09 DIAGNOSIS — M545 Low back pain, unspecified: Secondary | ICD-10-CM | POA: Diagnosis not present

## 2023-10-09 DIAGNOSIS — Z8261 Family history of arthritis: Secondary | ICD-10-CM

## 2023-10-09 DIAGNOSIS — E1043 Type 1 diabetes mellitus with diabetic autonomic (poly)neuropathy: Secondary | ICD-10-CM | POA: Diagnosis present

## 2023-10-09 DIAGNOSIS — E1065 Type 1 diabetes mellitus with hyperglycemia: Secondary | ICD-10-CM | POA: Diagnosis not present

## 2023-10-09 DIAGNOSIS — Z888 Allergy status to other drugs, medicaments and biological substances status: Secondary | ICD-10-CM

## 2023-10-09 DIAGNOSIS — K5730 Diverticulosis of large intestine without perforation or abscess without bleeding: Secondary | ICD-10-CM | POA: Diagnosis not present

## 2023-10-09 DIAGNOSIS — I152 Hypertension secondary to endocrine disorders: Secondary | ICD-10-CM | POA: Diagnosis present

## 2023-10-09 DIAGNOSIS — Z7982 Long term (current) use of aspirin: Secondary | ICD-10-CM

## 2023-10-09 DIAGNOSIS — Z90710 Acquired absence of both cervix and uterus: Secondary | ICD-10-CM | POA: Diagnosis not present

## 2023-10-09 DIAGNOSIS — Z823 Family history of stroke: Secondary | ICD-10-CM | POA: Diagnosis not present

## 2023-10-09 DIAGNOSIS — Z833 Family history of diabetes mellitus: Secondary | ICD-10-CM

## 2023-10-09 DIAGNOSIS — E039 Hypothyroidism, unspecified: Secondary | ICD-10-CM | POA: Diagnosis present

## 2023-10-09 DIAGNOSIS — T380X5A Adverse effect of glucocorticoids and synthetic analogues, initial encounter: Secondary | ICD-10-CM | POA: Diagnosis not present

## 2023-10-09 DIAGNOSIS — G8929 Other chronic pain: Secondary | ICD-10-CM | POA: Diagnosis present

## 2023-10-09 DIAGNOSIS — E1069 Type 1 diabetes mellitus with other specified complication: Secondary | ICD-10-CM | POA: Diagnosis present

## 2023-10-09 DIAGNOSIS — R42 Dizziness and giddiness: Secondary | ICD-10-CM | POA: Diagnosis present

## 2023-10-09 DIAGNOSIS — Z79890 Hormone replacement therapy (postmenopausal): Secondary | ICD-10-CM | POA: Diagnosis not present

## 2023-10-09 DIAGNOSIS — R1031 Right lower quadrant pain: Principal | ICD-10-CM

## 2023-10-09 DIAGNOSIS — Z794 Long term (current) use of insulin: Secondary | ICD-10-CM | POA: Diagnosis not present

## 2023-10-09 DIAGNOSIS — M79651 Pain in right thigh: Secondary | ICD-10-CM | POA: Diagnosis present

## 2023-10-09 DIAGNOSIS — G4733 Obstructive sleep apnea (adult) (pediatric): Secondary | ICD-10-CM | POA: Diagnosis present

## 2023-10-09 DIAGNOSIS — M5416 Radiculopathy, lumbar region: Secondary | ICD-10-CM | POA: Diagnosis present

## 2023-10-09 DIAGNOSIS — J439 Emphysema, unspecified: Secondary | ICD-10-CM | POA: Diagnosis present

## 2023-10-09 DIAGNOSIS — E103293 Type 1 diabetes mellitus with mild nonproliferative diabetic retinopathy without macular edema, bilateral: Secondary | ICD-10-CM | POA: Diagnosis present

## 2023-10-09 DIAGNOSIS — E785 Hyperlipidemia, unspecified: Secondary | ICD-10-CM | POA: Diagnosis present

## 2023-10-09 DIAGNOSIS — R112 Nausea with vomiting, unspecified: Secondary | ICD-10-CM | POA: Diagnosis not present

## 2023-10-09 DIAGNOSIS — Z8249 Family history of ischemic heart disease and other diseases of the circulatory system: Secondary | ICD-10-CM

## 2023-10-09 DIAGNOSIS — M4319 Spondylolisthesis, multiple sites in spine: Secondary | ICD-10-CM | POA: Diagnosis not present

## 2023-10-09 DIAGNOSIS — Z79899 Other long term (current) drug therapy: Secondary | ICD-10-CM

## 2023-10-09 DIAGNOSIS — J309 Allergic rhinitis, unspecified: Secondary | ICD-10-CM | POA: Diagnosis present

## 2023-10-09 DIAGNOSIS — E103299 Type 1 diabetes mellitus with mild nonproliferative diabetic retinopathy without macular edema, unspecified eye: Secondary | ICD-10-CM | POA: Diagnosis present

## 2023-10-09 DIAGNOSIS — K219 Gastro-esophageal reflux disease without esophagitis: Secondary | ICD-10-CM | POA: Diagnosis present

## 2023-10-09 DIAGNOSIS — K8689 Other specified diseases of pancreas: Secondary | ICD-10-CM | POA: Diagnosis not present

## 2023-10-09 DIAGNOSIS — E871 Hypo-osmolality and hyponatremia: Principal | ICD-10-CM | POA: Diagnosis present

## 2023-10-09 DIAGNOSIS — R600 Localized edema: Secondary | ICD-10-CM

## 2023-10-09 DIAGNOSIS — M4726 Other spondylosis with radiculopathy, lumbar region: Secondary | ICD-10-CM | POA: Diagnosis not present

## 2023-10-09 DIAGNOSIS — M4727 Other spondylosis with radiculopathy, lumbosacral region: Secondary | ICD-10-CM | POA: Diagnosis not present

## 2023-10-09 DIAGNOSIS — M48061 Spinal stenosis, lumbar region without neurogenic claudication: Secondary | ICD-10-CM | POA: Diagnosis not present

## 2023-10-09 DIAGNOSIS — E1159 Type 2 diabetes mellitus with other circulatory complications: Secondary | ICD-10-CM | POA: Diagnosis present

## 2023-10-09 LAB — CBC WITH DIFFERENTIAL/PLATELET
Abs Immature Granulocytes: 0.03 10*3/uL (ref 0.00–0.07)
Basophils Absolute: 0.1 10*3/uL (ref 0.0–0.1)
Basophils Relative: 1 %
Eosinophils Absolute: 0 10*3/uL (ref 0.0–0.5)
Eosinophils Relative: 0 %
HCT: 35.5 % — ABNORMAL LOW (ref 36.0–46.0)
Hemoglobin: 12.2 g/dL (ref 12.0–15.0)
Immature Granulocytes: 0 %
Lymphocytes Relative: 27 %
Lymphs Abs: 2.6 10*3/uL (ref 0.7–4.0)
MCH: 29.7 pg (ref 26.0–34.0)
MCHC: 34.4 g/dL (ref 30.0–36.0)
MCV: 86.4 fL (ref 80.0–100.0)
Monocytes Absolute: 1 10*3/uL (ref 0.1–1.0)
Monocytes Relative: 11 %
Neutro Abs: 5.8 10*3/uL (ref 1.7–7.7)
Neutrophils Relative %: 61 %
Platelets: 422 10*3/uL — ABNORMAL HIGH (ref 150–400)
RBC: 4.11 MIL/uL (ref 3.87–5.11)
RDW: 12.6 % (ref 11.5–15.5)
WBC: 9.5 10*3/uL (ref 4.0–10.5)
nRBC: 0 % (ref 0.0–0.2)

## 2023-10-09 LAB — COMPREHENSIVE METABOLIC PANEL
ALT: 29 U/L (ref 0–44)
AST: 29 U/L (ref 15–41)
Albumin: 4.3 g/dL (ref 3.5–5.0)
Alkaline Phosphatase: 99 U/L (ref 38–126)
Anion gap: 13 (ref 5–15)
BUN: 16 mg/dL (ref 8–23)
CO2: 26 mmol/L (ref 22–32)
Calcium: 9.4 mg/dL (ref 8.9–10.3)
Chloride: 84 mmol/L — ABNORMAL LOW (ref 98–111)
Creatinine, Ser: 0.65 mg/dL (ref 0.44–1.00)
GFR, Estimated: >60 mL/min (ref >60)
Glucose, Bld: 286 mg/dL — ABNORMAL HIGH (ref 70–99)
Potassium: 4 mmol/L (ref 3.5–5.1)
Sodium: 123 mmol/L — ABNORMAL LOW (ref 135–145)
Total Bilirubin: 1.1 mg/dL (ref 0.3–1.2)
Total Protein: 7.7 g/dL (ref 6.5–8.1)

## 2023-10-09 LAB — URINALYSIS, ROUTINE W REFLEX MICROSCOPIC
Bilirubin Urine: NEGATIVE
Glucose, UA: >=500 mg/dL — AB
Hgb urine dipstick: NEGATIVE
Ketones, ur: 20 mg/dL — AB
Leukocytes,Ua: NEGATIVE
Nitrite: NEGATIVE
Protein, ur: NEGATIVE mg/dL
Specific Gravity, Urine: 1.015 (ref 1.005–1.030)
Squamous Epithelial / HPF: 0 /[HPF] (ref 0–5)
pH: 6 (ref 5.0–8.0)

## 2023-10-09 LAB — LIPASE, BLOOD: Lipase: 21 U/L (ref 11–51)

## 2023-10-09 MED ORDER — ONDANSETRON HCL 4 MG/2ML IJ SOLN
4.0000 mg | Freq: Once | INTRAMUSCULAR | Status: AC
  Administered 2023-10-09: 4 mg via INTRAVENOUS
  Filled 2023-10-09: qty 2

## 2023-10-09 MED ORDER — LACTATED RINGERS IV SOLN: INTRAVENOUS | Status: DC

## 2023-10-09 MED ORDER — ACETAMINOPHEN 325 MG PO TABS: 650.0000 mg | ORAL_TABLET | Freq: Four times a day (QID) | ORAL | Status: DC | PRN

## 2023-10-09 MED ORDER — HIGH POTENCY IRON 65 MG PO TABS: 1.0000 | ORAL_TABLET | Freq: Every day | ORAL | Status: DC

## 2023-10-09 MED ORDER — ROSUVASTATIN CALCIUM 10 MG PO TABS
10.0000 mg | ORAL_TABLET | Freq: Every day | ORAL | Status: DC
  Administered 2023-10-10 – 2023-10-12 (×3): 10 mg via ORAL
  Filled 2023-10-09 (×3): qty 1

## 2023-10-09 MED ORDER — HYDRALAZINE HCL 20 MG/ML IJ SOLN: 5.0000 mg | Freq: Four times a day (QID) | INTRAMUSCULAR | Status: DC | PRN

## 2023-10-09 MED ORDER — OMEPRAZOLE MAGNESIUM 20 MG PO TBEC: 20.0000 mg | DELAYED_RELEASE_TABLET | Freq: Every day | ORAL | Status: DC

## 2023-10-09 MED ORDER — HYDROCODONE-ACETAMINOPHEN 5-325 MG PO TABS
1.0000 | ORAL_TABLET | ORAL | Status: DC | PRN
  Administered 2023-10-09 (×2): 2 via ORAL
  Administered 2023-10-10: 1 via ORAL
  Administered 2023-10-12: 2 via ORAL
  Filled 2023-10-09: qty 2
  Filled 2023-10-09: qty 1
  Filled 2023-10-09 (×2): qty 2

## 2023-10-09 MED ORDER — ONDANSETRON HCL 4 MG/2ML IJ SOLN: 4.0000 mg | Freq: Four times a day (QID) | INTRAMUSCULAR | Status: DC | PRN

## 2023-10-09 MED ORDER — PREGABALIN 75 MG PO CAPS
225.0000 mg | ORAL_CAPSULE | Freq: Two times a day (BID) | ORAL | Status: DC
  Administered 2023-10-09 – 2023-10-12 (×5): 225 mg via ORAL
  Filled 2023-10-09 (×6): qty 3

## 2023-10-09 MED ORDER — FERROUS SULFATE 325 (65 FE) MG PO TABS
325.0000 mg | ORAL_TABLET | Freq: Every day | ORAL | Status: DC
  Administered 2023-10-10 – 2023-10-12 (×3): 325 mg via ORAL
  Filled 2023-10-09 (×3): qty 1

## 2023-10-09 MED ORDER — KETOROLAC TROMETHAMINE 30 MG/ML IJ SOLN
30.0000 mg | Freq: Four times a day (QID) | INTRAMUSCULAR | Status: DC | PRN
  Administered 2023-10-09 (×2): 30 mg via INTRAVENOUS
  Filled 2023-10-09 (×2): qty 1

## 2023-10-09 MED ORDER — SORBITOL 70 % SOLN: 30.0000 mL | Freq: Every day | Status: DC | PRN

## 2023-10-09 MED ORDER — FLUTICASONE PROPIONATE 50 MCG/ACT NA SUSP
2.0000 | Freq: Every day | NASAL | Status: DC
  Administered 2023-10-10 – 2023-10-12 (×3): 2 via NASAL
  Filled 2023-10-09: qty 16

## 2023-10-09 MED ORDER — SODIUM CHLORIDE 0.9 % IV BOLUS
1000.0000 mL | Freq: Once | INTRAVENOUS | Status: AC
  Administered 2023-10-09: 1000 mL via INTRAVENOUS

## 2023-10-09 MED ORDER — PANTOPRAZOLE SODIUM 40 MG PO TBEC
40.0000 mg | DELAYED_RELEASE_TABLET | Freq: Every day | ORAL | Status: DC
  Administered 2023-10-10 – 2023-10-12 (×3): 40 mg via ORAL
  Filled 2023-10-09 (×3): qty 1

## 2023-10-09 MED ORDER — ACETAMINOPHEN 650 MG RE SUPP: 650.0000 mg | Freq: Four times a day (QID) | RECTAL | Status: DC | PRN

## 2023-10-09 MED ORDER — IOHEXOL 300 MG/ML  SOLN
100.0000 mL | Freq: Once | INTRAMUSCULAR | Status: AC | PRN
  Administered 2023-10-09: 100 mL via INTRAVENOUS

## 2023-10-09 MED ORDER — INSULIN GLARGINE-YFGN 100 UNIT/ML ~~LOC~~ SOLN
12.0000 [IU] | Freq: Every day | SUBCUTANEOUS | Status: DC
  Administered 2023-10-10 – 2023-10-11 (×2): 12 [IU] via SUBCUTANEOUS
  Filled 2023-10-09 (×2): qty 0.12

## 2023-10-09 MED ORDER — POLYETHYLENE GLYCOL 3350 17 G PO PACK: 17.0000 g | PACK | Freq: Every day | ORAL | Status: DC | PRN

## 2023-10-09 MED ORDER — ASPIRIN 81 MG PO TBEC
81.0000 mg | DELAYED_RELEASE_TABLET | Freq: Every day | ORAL | Status: DC
  Administered 2023-10-10 – 2023-10-12 (×3): 81 mg via ORAL
  Filled 2023-10-09 (×3): qty 1

## 2023-10-09 MED ORDER — INSULIN ASPART 100 UNIT/ML IJ SOLN
0.0000 [IU] | Freq: Three times a day (TID) | INTRAMUSCULAR | Status: DC
  Administered 2023-10-10: 2 [IU] via SUBCUTANEOUS
  Administered 2023-10-10: 5 [IU] via SUBCUTANEOUS
  Administered 2023-10-10: 3 [IU] via SUBCUTANEOUS
  Filled 2023-10-09 (×4): qty 1

## 2023-10-09 MED ORDER — ENOXAPARIN SODIUM 40 MG/0.4ML IJ SOSY
40.0000 mg | PREFILLED_SYRINGE | INTRAMUSCULAR | Status: DC
  Administered 2023-10-09 – 2023-10-11 (×3): 40 mg via SUBCUTANEOUS
  Filled 2023-10-09 (×3): qty 0.4

## 2023-10-09 MED ORDER — MECLIZINE HCL 25 MG PO TABS: 12.5000 mg | ORAL_TABLET | Freq: Three times a day (TID) | ORAL | Status: DC | PRN

## 2023-10-09 MED ORDER — MORPHINE SULFATE (PF) 4 MG/ML IV SOLN
4.0000 mg | Freq: Once | INTRAVENOUS | Status: AC
  Administered 2023-10-09: 4 mg via INTRAVENOUS
  Filled 2023-10-09: qty 1

## 2023-10-09 MED ORDER — ONDANSETRON HCL 4 MG PO TABS: 4.0000 mg | ORAL_TABLET | Freq: Four times a day (QID) | ORAL | Status: DC | PRN

## 2023-10-09 MED ORDER — LEVOTHYROXINE SODIUM 50 MCG PO TABS
100.0000 ug | ORAL_TABLET | Freq: Every day | ORAL | Status: DC
  Administered 2023-10-10 – 2023-10-12 (×3): 100 ug via ORAL
  Filled 2023-10-09 (×3): qty 2

## 2023-10-09 NOTE — ED Triage Notes (Signed)
Patient reports lower back pain that radiates to her right hip and goes down to her right knee that started 4 days ago.  Patient denies injury or fall.  Patient does reports history of back surgery.

## 2023-10-09 NOTE — Assessment & Plan Note (Addendum)
Suspect lumbar radiculopathy is causing pt's RLQ/right groin radiates down to her knee. Hx of lumbar stenosis s/p  L4-5  posterior lateral interbody fusion by Dr. Lovell Sheehan  of [facility] on 03/18/2021.  --IV Toradol PRN --PO Norco PRN --Resume home Lyrica (increase from bedtime to BID for now) --MRI vs CT lumbar spine tomorrow --Trial of steroids, but will try to get sugar down before starting given glucose almost 300 --Will consider consult to Neurosurgery

## 2023-10-09 NOTE — ED Notes (Signed)
Called floor and verified that patient is able to come to floor at this time.

## 2023-10-09 NOTE — ED Provider Notes (Signed)
[facility]    CSN: [id number] Arrival date & time: 10/09/23  2725      History   Chief Complaint Chief Complaint  Patient presents with   Hip Pain    right    HPI  HPI Elbert Jaydie Ziegenhagen is a 72 y.o. female.   Carra presents for lower back pain that radiates to anterior right hip and knee. Symptoms started 4 days go.   Pain is dull in the back but stingling in the anterior hip. No history of kidney stones. Pain gets worse at night. She first noticed the pain when she was going to bed 4 nights ago.  Tried tylenol and Motrin every 8 hours.  Took Tramadol which helped. She didn't get much sleep due to the pain and the pain gets worse at night. No trauma or falls.   She started vomiting with nausea.  No diarrhea. She took Senna as she was concerned she was constipated. She had a couple small bowel movement but her bowel movement last night was good.    Has been vomiting before the pain started a couple night ago.  She last vomited around 9 PM.  Didn't take her medications as she didn't want to throw them back up. She is a T1DM and her blood sugars have been in the 300s.   Has chronic cough that she believes is due to her dog.      Past Medical History:  Diagnosis Date   Allergy 11/2021   Lisinolpril-cough   Anemia    Anxiety    Arthritis    Back pain    Diabetes mellitus without complication (HCC)    type 1   Emphysema of lung (HCC)    GERD (gastroesophageal reflux disease)    Heart murmur    Hypertension associated with diabetes (HCC) 03/03/2018   Hypothyroidism    Thyroid disease    Varicose vein     Patient Active Problem List   Diagnosis Date Noted   Dizziness 09/29/2023   Right upper quadrant abdominal pain 08/06/2023   Bilateral hand pain 07/02/2023   Chronic cough 05/20/2023   OSA (obstructive sleep apnea) 03/16/2023   Other fatigue 03/12/2023   Autonomic neuropathy due to diabetes (HCC) 12/31/2022   Localized osteoarthritis of left knee  12/31/2022   Allergic rhinitis 08/28/2022   Pruritus 08/28/2022   Murmur 05/27/2022   Gastroesophageal reflux disease without esophagitis 05/27/2022   Spinal stenosis of lumbar region 05/22/2022   Chronic lumbar pain 05/22/2022   Varicose veins of lower extremity with edema, bilateral 08/23/2021   Venous insufficiency of both lower extremities 08/23/2021   Hyperlipidemia due to type 1 diabetes mellitus (HCC) 06/13/2021   Obesity (BMI 30-39.9) 06/13/2021   Hypertension associated with diabetes (HCC) 03/03/2018   Osteopenia 01/13/2018   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 (HCC) 08/28/2017   Liver hemangioma 08/28/2017   Background diabetic retinopathy (HCC) 05/22/2015   Hypothyroidism 10/10/2014    Past Surgical History:  Procedure Laterality Date   ABDOMINAL HYSTERECTOMY  1994   CHOLECYSTECTOMY  09/07/2013   COLONOSCOPY  2009   normal   COLONOSCOPY WITH PROPOFOL N/A 10/20/2018   Procedure: COLONOSCOPY WITH PROPOFOL;  Surgeon: Earline Mayotte, MD;  Location: [facility] ENDOSCOPY;  Service: Endoscopy;  Laterality: N/A;   INCONTINENCE SURGERY  2003   left leg vein ligation  2015   SPINE SURGERY  03-18-2021   VARICOSE VEIN SURGERY  1990's   Dr  Byrnett   VEIN SURGERY Left 11/17/2017   vein closure procedure/Dr Evette Cristal    OB History     Gravida  2   Para  2   Term  2   Preterm      AB      Living  2      SAB      IAB      Ectopic      Multiple      Live Births  1        Obstetric Comments  1st Menstrual Cycle:  14 1st Pregnancy:  24          Home Medications    Prior to Admission medications   Medication Sig Start Date End Date Taking? Authorizing Provider  acetaminophen (TYLENOL) 650 MG CR tablet Take 650 mg by mouth every 8 (eight) hours as needed for pain.    [provider]  APPLE CIDER VINEGAR PO Take 600 mg by mouth daily.     [provider]  aspirin EC 81 MG  tablet Take 81 mg by mouth daily. Takes at bedtime    [provider]  beta carotene 91478 UNIT capsule Take 25,000 Units by mouth daily.    [provider]  Cholecalciferol (VITAMIN D3) 250 MCG (10000 UT) capsule Take 10,000 Units by mouth daily.    [provider]  Continuous Glucose Sensor (DEXCOM G7 SENSOR) MISC Change sensors every 10 days 09/11/23   Shamleffer, Konrad Dolores, MD  Ferrous Sulfate Dried (HIGH POTENCY IRON) 65 MG TABS Take 1 tablet by mouth daily.    [provider]  fluticasone (FLONASE) 50 MCG/ACT nasal spray SPRAY 2 SPRAYS INTO EACH NOSTRIL EVERY DAY 09/25/22   Alfredia Ferguson, PA-C  furosemide (LASIX) 20 MG tablet TAKE 1 TABLET BY MOUTH DAILY 08/10/23   Bethanie Dicker, NP  glucose blood (ACCU-CHEK GUIDE) test strip 1 each by Other route daily in the afternoon. Use as instructed 11/12/22   Shamleffer, Konrad Dolores, MD  insulin aspart (NOVOLOG FLEXPEN) 100 UNIT/ML FlexPen MAX DAILY 30 UNITS 05/14/23   Shamleffer, Konrad Dolores, MD  insulin glargine (LANTUS SOLOSTAR) 100 UNIT/ML Solostar Pen Inject 12 Units into the skin daily. 05/14/23   Shamleffer, Konrad Dolores, MD  Insulin Pen Needle 32G X 4 MM MISC 1 Device by Does not apply route in the morning, at noon, in the evening, and at bedtime. 05/14/23   Shamleffer, Konrad Dolores, MD  levothyroxine (SYNTHROID) 100 MCG tablet Take 1 tablet (100 mcg total) by mouth daily before breakfast. 05/14/23   Shamleffer, Konrad Dolores, MD  MAGNESIUM-ZINC PO Take 1 tablet by mouth daily.    [provider]  meclizine (ANTIVERT) 12.5 MG tablet Take 1 tablet (12.5 mg total) by mouth 3 (three) times daily as needed for dizziness. 09/29/23   Kara Dies, NP  Multiple Vitamins-Minerals (ABC PLUS SENIOR ADULTS 50+ PO) Take 1 tablet by mouth daily. 11/19/20   [provider]  omeprazole (PRILOSEC OTC) 20 MG tablet Take 20 mg by mouth daily.    [provider]  potassium chloride  SA (KLOR-CON M) 20 MEQ tablet Take 1 tablet (20 mEq total) by mouth daily. Once daily along with lasix. 08/10/23   Bethanie Dicker, NP  pregabalin (LYRICA) 225 MG capsule Take 1 capsule (225 mg total) by mouth at bedtime. 05/14/23   Shamleffer, Konrad Dolores, MD  rosuvastatin (CRESTOR) 10 MG tablet Take 1 tablet (10 mg total) by mouth daily. 08/10/23   Konrad Dolores,  Arville Lime, NP    Family History Family History  Problem Relation Age of Onset   Diabetes Mother    Pancreatitis Mother    Heart disease Father    Stroke Father        x 2   Hypertension Father    Varicose Veins Father    Fibromyalgia Sister    Scoliosis Sister    Arthritis Sister    Diabetes Sister    Breast cancer Neg Hx     Social History Social History   Tobacco Use   Smoking status: Never   Smokeless tobacco: Never  Vaping Use   Vaping status: Never Used  Substance Use Topics   Alcohol use: Yes    Alcohol/week: 9.0 standard drinks of alcohol    Types: 7 Glasses of wine, 2 Shots of liquor per week    Comment: vodka 1 drink nightly  and/ or wine   Drug use: No     Allergies   Lisinopril   Review of Systems Review of Systems: :negative unless otherwise stated in HPI.      Physical Exam Triage Vital Signs ED Triage Vitals  Encounter Vitals Group     BP 10/09/23 1018 (!) 171/84     Systolic BP Percentile --      Diastolic BP Percentile --      Pulse Rate 10/09/23 1018 86     Resp 10/09/23 1018 14     Temp 10/09/23 1018 98.4 F (36.9 C)     Temp Source 10/09/23 1018 Oral     SpO2 10/09/23 1018 94 %     Weight 10/09/23 1016 175 lb (79.4 kg)     Height 10/09/23 1016 5\' 5"  (1.651 m)     Head Circumference --      Peak Flow --      Pain Score 10/09/23 1016 8     Pain Loc --      Pain Education --      Exclude from Growth Chart --    No data found.  Updated Vital Signs BP (!) 183/82 (BP Location: Left Arm)   Pulse 86   Temp 98.4 F (36.9 C) (Oral)   Resp 14   Ht 5\' 5"  (1.651 m)   Wt 79.4 kg   SpO2  94%   BMI 29.12 kg/m   Visual Acuity Right Eye Distance:   Left Eye Distance:   Bilateral Distance:    Right Eye Near:   Left Eye Near:    Bilateral Near:     Physical Exam GEN: non-toxic appearing elderly female CVS:  regular rate and rhythm, + murmur heard across all posts  RESP: no increased work of breathing, clear bilaterally  ABD:   Soft, RLQ tenderness, non-distended, no guarding, no rebound, active bowel sounds throughout, no CVA tenderness  MSK: no edema, or calf tenderness SKIN: warm, dry, no rash on visible skin NEURO: alert, moves all extremities appropriately     UC Treatments / Results  Labs (all labs ordered are listed, but only abnormal results are displayed) Labs Reviewed - No data to display  EKG   Radiology No results found.   Procedures Procedures (including critical care time)  Medications Ordered in UC Medications - No data to display  Initial Impression / Assessment and Plan / UC Course  I have reviewed the triage vital signs and the nursing notes.  Pertinent labs & imaging results that were available during my care of the patient were reviewed by me  and considered in my medical decision making (see chart for details).      Pt is a 72 y.o.  female with T1DM, hypothyroidism, GERD, hyponatremia, emphysema presents for right hip and lower back pain that radiates to the knee. She is hypertensive BP 171/84 and after recheck it was 183/82.  She notes she has not taken her medications today.  On exam, pt has abdominal tenderness in the RLQ. She has been vomiting and having hyperglycemia. Orders terminated.  Urgent care workup truncated..  I question whether her anterior right hip pain is actually right lower quadrant abdominal pain.  I can not rule out appendicitis or DKA here. Pt needs a higher level of care andl likely advanced imaging.  Recommended emergency department evaluation and she will travel to [facility] emergency department with her  husband.  Called and spoke with Herbert Seta the triage RN who will await patient's arrival.  Discussed MDM, treatment plan and plan for follow-up with patient who agrees with plan.   Final Clinical Impressions(s) / UC Diagnoses   Final diagnoses:  Nausea and vomiting, unspecified vomiting type  Type 1 diabetes mellitus with hyperglycemia (HCC)  Right lower quadrant abdominal tenderness without rebound tenderness  Acute right-sided low back pain, unspecified whether sciatica present     Discharge Instructions      You have been advised to follow up immediately in the emergency department for concerning signs or symptoms as discussed during your visit. If you declined EMS transport, please have a family member take you directly to the ED at this time. Do not delay.   Based on concerns about condition, if you do not follow up in the ED, you may risk poor outcomes including worsening of condition, delayed treatment and potentially life threatening issues. If you have declined to go to the ED at this time, you should call your PCP immediately to set up a follow up appointment.      ED Prescriptions   None    PDMP not reviewed this encounter.   Katha Cabal, DO 10/09/23 1114

## 2023-10-09 NOTE — Assessment & Plan Note (Signed)
On statin.

## 2023-10-09 NOTE — ED Triage Notes (Signed)
Patient sent from UC. Patient reports right hip pain that radiates down to knee X4 days. Denies injury  History back surgery

## 2023-10-09 NOTE — Assessment & Plan Note (Signed)
Flonase

## 2023-10-09 NOTE — ED Provider Notes (Signed)
-----------------------------------------   3:16 PM on 10/09/2023 -----------------------------------------  Blood pressure (!) 159/74, pulse 100, temperature 97.8 F (36.6 C), temperature source Oral, resp. rate 16, height 5\' 5"  (1.651 m), weight 78.9 kg, SpO2 96%.  Assuming care from [facility].  In short, Karla Patton is a 72 y.o. female with a chief complaint of Hip Pain .  Refer to the original H&P for additional details.  The current plan of care is to follow-up CT results, anticipate admission for vomiting and hyponatremia.  ----------------------------------------- 4:41 PM on 10/09/2023 ----------------------------------------- CT imaging shows no evidence of appendicitis or other acute process.  Suspect pain radiating around patient's back into the hip is due to radiculopathy, but given her significant hyponatremia, she would benefit from admission.  Case discussed with hospitalist for admission.    Chesley Noon, MD 10/09/23 [phone]

## 2023-10-09 NOTE — Discharge Instructions (Signed)
You have been advised to follow up immediately in the emergency department for concerning signs or symptoms as discussed during your visit. If you declined EMS transport, please have a family member take you directly to the ED at this time. Do not delay.   Based on concerns about condition, if you do not follow up in the ED, you may risk poor outcomes including worsening of condition, delayed treatment and potentially life threatening issues. If you have declined to go to the ED at this time, you should call your PCP immediately to set up a follow up appointment.   

## 2023-10-09 NOTE — Assessment & Plan Note (Signed)
PPI  ? ?

## 2023-10-09 NOTE — H&P (Addendum)
History and Physical    Patient: Karla Patton [id number]:[id number] DOB: 1951/12/14 DOA: 10/09/2023 DOS: the patient was seen and examined on 10/09/2023 PCP: Bethanie Dicker, NP  Patient coming from: Home  Chief Complaint:  Chief Complaint  Patient presents with   Hip Pain   HPI: Karla Patton is a 72 y.o. female with medical history significant of type 1 diabetes with retinopathy, autonomic neuropathy, HTN, HLD, GERD, OSA, hypothyroidism, lumbar spinal stenosis s/p lumbar fusion in 2022 who presented to the ED today for evaluation of severe Right lower abdominal / groin pain radiating down the right leg to the knee x about 4 days.  Also reported intractable nausea and vomiting since yesterday, with 7-8 episodes.  Has not vomited again today.  She reports R leg feels heavy, having increased lower back pain.  She denies fevers but has chronic intermittent chills.  No recent illnesses or symptoms other than chronic cough attributed to post-nasal drainage.    ED course -- initial vitals temp 97.8, HR 100, RR 16, BP 159/74, spO2 96% on room air. Labs -- CMP notable for sodium 123 (down from 129 on 10/8), chloride 84, glucose 286, otherwise normal.  CBC unremarkable except platelets 422k. Imaging -- CT abdomen/pelvis no acute findings to explain RLQ pain including normal appendix; diverticulosis without signs of acute diverticulitis, right hepatic lobe focus (known hemangioma) slightly smaller from prior study.  Pt admitted for further evaluation and management of hyponatremia and severe back and right lower extremity pain suspicious for lumbar radiculopathy in pt with known lumbar spinal stenosis.    Review of Systems: As mentioned in the history of present illness. All other systems reviewed and are negative.   Past Medical History:  Diagnosis Date   Allergy 11/2021   Lisinolpril-cough   Anemia    Anxiety    Arthritis    Back pain    Diabetes mellitus without complication (HCC)     type 1   Emphysema of lung (HCC)    GERD (gastroesophageal reflux disease)    Heart murmur    Hypertension associated with diabetes (HCC) 03/03/2018   Hypothyroidism    Thyroid disease    Varicose vein    Past Surgical History:  Procedure Laterality Date   ABDOMINAL HYSTERECTOMY  1994   CHOLECYSTECTOMY  09/07/2013   COLONOSCOPY  2009   normal   COLONOSCOPY WITH PROPOFOL N/A 10/20/2018   Procedure: COLONOSCOPY WITH PROPOFOL;  Surgeon: Earline Mayotte, MD;  Location: [facility] ENDOSCOPY;  Service: Endoscopy;  Laterality: N/A;   INCONTINENCE SURGERY  2003   left leg vein ligation  2015   SPINE SURGERY  03-18-2021   VARICOSE VEIN SURGERY  1990's   Dr Lemar Livings   VEIN SURGERY Left 11/17/2017   vein closure procedure/Dr Evette Cristal   Social History:  reports that she has never smoked. She has never used smokeless tobacco. She reports current alcohol use of about 16.0 standard drinks of alcohol per week. She reports that she does not use drugs.  Allergies  Allergen Reactions   Lisinopril Cough    Family History  Problem Relation Age of Onset   Diabetes Mother    Pancreatitis Mother    Heart disease Father    Stroke Father        x 2   Hypertension Father    Varicose Veins Father    Fibromyalgia Sister    Scoliosis Sister    Arthritis Sister    Diabetes Sister    Breast cancer Neg  Hx     Prior to Admission medications   Medication Sig Start Date End Date Taking? Authorizing Provider  acetaminophen (TYLENOL) 650 MG CR tablet Take 650 mg by mouth every 8 (eight) hours as needed for pain.   Yes [provider]  APPLE CIDER VINEGAR PO Take 600 mg by mouth daily.    Yes [provider]  aspirin EC 81 MG tablet Take 81 mg by mouth daily. Takes at bedtime   Yes [provider]  beta carotene 01027 UNIT capsule Take 25,000 Units by mouth daily.   Yes [provider]  Cholecalciferol (VITAMIN D3) 250 MCG (10000 UT) capsule Take 10,000 Units by mouth  daily.   Yes [provider]  Ferrous Sulfate Dried (HIGH POTENCY IRON) 65 MG TABS Take 1 tablet by mouth daily.   Yes [provider]  fluticasone (FLONASE) 50 MCG/ACT nasal spray SPRAY 2 SPRAYS INTO EACH NOSTRIL EVERY DAY 09/25/22  Yes Drubel, Lillia Abed, PA-C  furosemide (LASIX) 20 MG tablet TAKE 1 TABLET BY MOUTH DAILY 08/10/23  Yes Bethanie Dicker, NP  insulin aspart (NOVOLOG FLEXPEN) 100 UNIT/ML FlexPen MAX DAILY 30 UNITS 05/14/23  Yes Shamleffer, Konrad Dolores, MD  insulin glargine (LANTUS SOLOSTAR) 100 UNIT/ML Solostar Pen Inject 12 Units into the skin daily. 05/14/23  Yes Shamleffer, Konrad Dolores, MD  levothyroxine (SYNTHROID) 100 MCG tablet Take 1 tablet (100 mcg total) by mouth daily before breakfast. 05/14/23  Yes Shamleffer, Konrad Dolores, MD  MAGNESIUM-ZINC PO Take 1 tablet by mouth daily.   Yes [provider]  meclizine (ANTIVERT) 12.5 MG tablet Take 1 tablet (12.5 mg total) by mouth 3 (three) times daily as needed for dizziness. 09/29/23  Yes Kara Dies, NP  Multiple Vitamins-Minerals (ABC PLUS SENIOR ADULTS 50+ PO) Take 1 tablet by mouth daily. 11/19/20  Yes [provider]  omeprazole (PRILOSEC OTC) 20 MG tablet Take 20 mg by mouth daily.   Yes [provider]  potassium chloride SA (KLOR-CON M) 20 MEQ tablet Take 1 tablet (20 mEq total) by mouth daily. Once daily along with lasix. 08/10/23  Yes Bethanie Dicker, NP  pregabalin (LYRICA) 225 MG capsule Take 1 capsule (225 mg total) by mouth at bedtime. 05/14/23  Yes Shamleffer, Konrad Dolores, MD  rosuvastatin (CRESTOR) 10 MG tablet Take 1 tablet (10 mg total) by mouth daily. 08/10/23  Yes Bethanie Dicker, NP    Physical Exam: Vitals:   10/09/23 1222 10/09/23 1223 10/09/23 1831  BP: (!) 159/74  (!) 172/74  Pulse: 100  96  Resp: 16  18  Temp: 97.8 F (36.6 C)  98.4 F (36.9 C)  TempSrc: Oral  Oral  SpO2: 96%  98%  Weight:  78.9 kg   Height:  5\' 5"  (1.651 m)    General exam: awake,  alert, in mild distress due to pain HEENT: atraumatic, clear conjunctiva, anicteric sclera, moist mucus membranes, hearing grossly normal  Respiratory system: CTAB, no wheezes, rales or rhonchi, normal respiratory effort. Cardiovascular system: normal S1/S2, RRR, no JVD, murmurs, rubs, gallops,  no pedal edema.   Gastrointestinal system: soft, tenderness in RLQ / R groin area Central nervous system: A&O x 4. no gross focal neurologic deficits, normal speech Extremities: moves all, no edema, normal tone Skin: dry, intact, normal temperature, normal color, No rashes, lesions or ulcers Psychiatry: normal mood, congruent affect, judgement and insight appear normal   Data Reviewed:  Notable labs & imaging as reviewed above.  Assessment and Plan:  * Hyponatremia Na 123  on admission, down from 129 on 09/29/23. Pt reports chronic mild hyponatremia. Baseline Na appears 130-134.  Given vomiting, suspect hypovolemic in setting of continued Lasix use.  Pt appears dry to euvolemic on exam. --Hold Lasix --IV fluids LR @ just 40 cc/hr due to critical shortages --Serial BMP's --Add on serum osm, TSH level, check AM cortisol   Acute pain of right thigh Suspect lumbar radiculopathy is causing pt's RLQ/right groin radiates down to her knee. Hx of lumbar stenosis s/p  L4-5  posterior lateral interbody fusion by Dr. Lovell Sheehan  of [facility] on 03/18/2021.  --IV Toradol PRN --PO Norco PRN --Resume home Lyrica (increase from bedtime to BID for now) --MRI vs CT lumbar spine tomorrow --Trial of steroids, but will try to get sugar down before starting given glucose almost 300 --Will consider consult to Neurosurgery  Dizziness Home meclizine PRN  Allergic rhinitis Flonase  Gastroesophageal reflux disease without esophagitis PPI   Chronic lumbar pain With acute worsening along with suspected radicular symptoms as above.  Spinal stenosis of lumbar region With acute radicular type  pain as outlined above.  Hyperlipidemia due to type 1 diabetes mellitus (HCC) On statin  Hypertension associated with diabetes (HCC) Hold Lasix with hyponatremia PRN hydralazine for now  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 (HCC) Semglee 12 units daily Sliding scale Novolog If steroids started, anticipate postprandial hyperglycemic and escalate insulin accordingly.  Hypothyroidism Synthroid      Advance Care Planning: Full Code  Consults: None  Family Communication: None  Severity of Illness: The appropriate patient status for this patient is OBSERVATION. Observation status is judged to be reasonable and necessary in order to provide the required intensity of service to ensure the patient's safety. The patient's presenting symptoms, physical exam findings, and initial radiographic and laboratory data in the context of their medical condition is felt to place them at decreased risk for further clinical deterioration. Furthermore, it is anticipated that the patient will be medically stable for discharge from the hospital within 2 midnights of admission.   Author: Pennie Banter, DO 10/09/2023 8:32 PM  For on call review [email or web address].

## 2023-10-09 NOTE — Assessment & Plan Note (Signed)
With acute radicular type pain as outlined above.

## 2023-10-09 NOTE — Assessment & Plan Note (Signed)
Semglee 12 units daily Sliding scale Novolog If steroids started, anticipate postprandial hyperglycemic and escalate insulin accordingly.

## 2023-10-09 NOTE — Assessment & Plan Note (Signed)
Synthroid 

## 2023-10-09 NOTE — Assessment & Plan Note (Signed)
Home meclizine PRN

## 2023-10-09 NOTE — ED Notes (Signed)
Patient is being discharged from the Urgent Care and sent to the Emergency Department via Personal Vehicle . Per Dr. Rachael Darby, patient is in need of higher level of care due to Lower Abdominal Pain and Vomiting. Patient is aware and verbalizes understanding of plan of care.  Vitals:   10/09/23 1018 10/09/23 1049  BP: (!) 171/84 (!) 183/82  Pulse: 86   Resp: 14   Temp: 98.4 F (36.9 C)   SpO2: 94%

## 2023-10-09 NOTE — Assessment & Plan Note (Signed)
Holding Lasix with hyponatremia Start irbesartan 75 mg daily  PRN hydralazine for now

## 2023-10-09 NOTE — Assessment & Plan Note (Signed)
With acute worsening along with suspected radicular symptoms as above.

## 2023-10-09 NOTE — Assessment & Plan Note (Signed)
Na 123 on admission, down from 129 on 09/29/23. Pt reports chronic mild hyponatremia. Baseline Na appears 130-134.  Given vomiting, suspect hypovolemic in setting of continued Lasix use.  Pt appears dry to euvolemic on exam. Na 123 >> 124 --Hold Lasix --Initially on conservative fluids @ 40 cc, sodium unchanged at 123 this AM ----500 cc NS fluid challenge, sodium improved slightly to 124 --Serial BMP's --Add on serum osm, TSH level, check AM cortisol

## 2023-10-09 NOTE — ED Notes (Signed)
See triage note  Presents with pain to right lower abd/groin area   states pain radiates into lower back  Pain started a couple of days ago  Denies any fever  Positive n/v

## 2023-10-09 NOTE — ED Provider Notes (Signed)
[facility] Provider Note    Event Date/Time   First MD Initiated Contact with Patient 10/09/23 1243     (approximate)   History   Hip Pain   HPI  Karla Patton is a 72 y.o. female   presents to the ED after being seen at urgent care for right hip pain but also right lower quadrant pain for 4 days.  Patient has experienced some nausea and vomiting but denies diarrhea.  Had normal bowel movement yesterday.  Patient states since yesterday she has vomited approximately 5 or 6 times and only once today.  Patient drank a protein drink approximately 2 hours before arriving to the ED.  Patient also reports that she has right hip pain which is worse with lying down and occasionally has some radiation into her thigh.  No recent injury.  She does have history of lumbar spine surgery 2022, diabetes type 1, hypertension, reflux esophagitis, venous insufficiency lower extremities, hypothyroidism and sleep apnea.      Physical Exam   Triage Vital Signs: ED Triage Vitals  Encounter Vitals Group     BP 10/09/23 1222 (!) 159/74     Systolic BP Percentile --      Diastolic BP Percentile --      Pulse Rate 10/09/23 1222 100     Resp 10/09/23 1222 16     Temp 10/09/23 1222 97.8 F (36.6 C)     Temp Source 10/09/23 1222 Oral     SpO2 10/09/23 1222 96 %     Weight 10/09/23 1223 174 lb (78.9 kg)     Height 10/09/23 1223 5\' 5"  (1.651 m)     Head Circumference --      Peak Flow --      Pain Score 10/09/23 1223 7     Pain Loc --      Pain Education --      Exclude from Growth Chart --     Most recent vital signs: Vitals:   10/09/23 1222  BP: (!) 159/74  Pulse: 100  Resp: 16  Temp: 97.8 F (36.6 C)  SpO2: 96%     General: Awake, no distress.  Alert, talkative, answers questions appropriately. CV:  Good peripheral perfusion.  Heart regular rate and rhythm. Resp:  Normal effort.  Clear bilaterally. Abd:  No distention.  Soft, tenderness noted lower abdomen  and more pronounced in the right lower quadrant area.  No rebound or referred pain.  Bowel sounds are normoactive x 4 quadrants.  Patient also has tenderness on palpation of the right hip laterally and posteriorly.  She has increased pain with range of motion.  Patient is able to stand and ambulate.  No shortening of the lower extremities or rotation is noted. Other:     ED Results / Procedures / Treatments   Labs (all labs ordered are listed, but only abnormal results are displayed) Labs Reviewed  CBC WITH DIFFERENTIAL/PLATELET - Abnormal; Notable for the following components:      Result Value   HCT 35.5 (*)    Platelets 422 (*)    All other components within normal limits  COMPREHENSIVE METABOLIC PANEL - Abnormal; Notable for the following components:   Sodium 123 (*)    Chloride 84 (*)    Glucose, Bld 286 (*)    All other components within normal limits  URINALYSIS, ROUTINE W REFLEX MICROSCOPIC - Abnormal; Notable for the following components:   Color, Urine YELLOW (*)  APPearance CLEAR (*)    Glucose, UA >=500 (*)    Ketones, ur 20 (*)    Bacteria, UA RARE (*)    All other components within normal limits  LIPASE, BLOOD     EKG  ***   RADIOLOGY ***    PROCEDURES:  Critical Care performed:   Procedures   MEDICATIONS ORDERED IN ED: Medications  sodium chloride 0.9 % bolus 1,000 mL (1,000 mLs Intravenous New Bag/Given 10/09/23 1405)  iohexol (OMNIPAQUE) 300 MG/ML solution 100 mL (100 mLs Intravenous Contrast Given 10/09/23 1441)  ondansetron (ZOFRAN) injection 4 mg (4 mg Intravenous Given 10/09/23 1437)  morphine (PF) 4 MG/ML injection 4 mg (4 mg Intravenous Given 10/09/23 1437)     IMPRESSION / MDM / ASSESSMENT AND PLAN / ED COURSE  I reviewed the triage vital signs and the nursing notes.   Differential diagnosis includes, but is not limited to,   ----------------------------------------- 3:22 PM on  10/09/2023 ----------------------------------------- Patient history and physical have been discussed with Dr. Larinda Buttery who is taking over the care of this patient at this time.      Patient's presentation is most consistent with {EM COPA:27473}  FINAL CLINICAL IMPRESSION(S) / ED DIAGNOSES   Final diagnoses:  RLQ abdominal pain  Hyponatremia     Rx / DC Orders   ED Discharge Orders     None        Note:  This document was prepared using Dragon voice recognition software and may include unintentional dictation errors.

## 2023-10-10 ENCOUNTER — Inpatient Hospital Stay: Payer: Medicare Other

## 2023-10-10 DIAGNOSIS — Z90710 Acquired absence of both cervix and uterus: Secondary | ICD-10-CM | POA: Diagnosis not present

## 2023-10-10 DIAGNOSIS — E1065 Type 1 diabetes mellitus with hyperglycemia: Secondary | ICD-10-CM | POA: Diagnosis not present

## 2023-10-10 DIAGNOSIS — M4727 Other spondylosis with radiculopathy, lumbosacral region: Secondary | ICD-10-CM | POA: Diagnosis not present

## 2023-10-10 DIAGNOSIS — Z794 Long term (current) use of insulin: Secondary | ICD-10-CM | POA: Diagnosis not present

## 2023-10-10 DIAGNOSIS — T380X5A Adverse effect of glucocorticoids and synthetic analogues, initial encounter: Secondary | ICD-10-CM | POA: Diagnosis not present

## 2023-10-10 DIAGNOSIS — Z79899 Other long term (current) drug therapy: Secondary | ICD-10-CM | POA: Diagnosis not present

## 2023-10-10 DIAGNOSIS — E1043 Type 1 diabetes mellitus with diabetic autonomic (poly)neuropathy: Secondary | ICD-10-CM | POA: Diagnosis present

## 2023-10-10 DIAGNOSIS — E039 Hypothyroidism, unspecified: Secondary | ICD-10-CM | POA: Diagnosis present

## 2023-10-10 DIAGNOSIS — M5416 Radiculopathy, lumbar region: Secondary | ICD-10-CM | POA: Diagnosis present

## 2023-10-10 DIAGNOSIS — Z981 Arthrodesis status: Secondary | ICD-10-CM | POA: Diagnosis not present

## 2023-10-10 DIAGNOSIS — E1069 Type 1 diabetes mellitus with other specified complication: Secondary | ICD-10-CM | POA: Diagnosis present

## 2023-10-10 DIAGNOSIS — R42 Dizziness and giddiness: Secondary | ICD-10-CM | POA: Diagnosis present

## 2023-10-10 DIAGNOSIS — E785 Hyperlipidemia, unspecified: Secondary | ICD-10-CM | POA: Diagnosis present

## 2023-10-10 DIAGNOSIS — M48061 Spinal stenosis, lumbar region without neurogenic claudication: Secondary | ICD-10-CM | POA: Diagnosis present

## 2023-10-10 DIAGNOSIS — M4726 Other spondylosis with radiculopathy, lumbar region: Secondary | ICD-10-CM | POA: Diagnosis not present

## 2023-10-10 DIAGNOSIS — M4319 Spondylolisthesis, multiple sites in spine: Secondary | ICD-10-CM | POA: Diagnosis not present

## 2023-10-10 DIAGNOSIS — J439 Emphysema, unspecified: Secondary | ICD-10-CM | POA: Diagnosis present

## 2023-10-10 DIAGNOSIS — E871 Hypo-osmolality and hyponatremia: Secondary | ICD-10-CM | POA: Diagnosis present

## 2023-10-10 DIAGNOSIS — I152 Hypertension secondary to endocrine disorders: Secondary | ICD-10-CM | POA: Diagnosis present

## 2023-10-10 DIAGNOSIS — G4733 Obstructive sleep apnea (adult) (pediatric): Secondary | ICD-10-CM | POA: Diagnosis present

## 2023-10-10 DIAGNOSIS — Z9049 Acquired absence of other specified parts of digestive tract: Secondary | ICD-10-CM | POA: Diagnosis not present

## 2023-10-10 DIAGNOSIS — Z79890 Hormone replacement therapy (postmenopausal): Secondary | ICD-10-CM | POA: Diagnosis not present

## 2023-10-10 DIAGNOSIS — E103299 Type 1 diabetes mellitus with mild nonproliferative diabetic retinopathy without macular edema, unspecified eye: Secondary | ICD-10-CM | POA: Diagnosis present

## 2023-10-10 DIAGNOSIS — J309 Allergic rhinitis, unspecified: Secondary | ICD-10-CM | POA: Diagnosis present

## 2023-10-10 DIAGNOSIS — Z823 Family history of stroke: Secondary | ICD-10-CM | POA: Diagnosis not present

## 2023-10-10 DIAGNOSIS — Z7982 Long term (current) use of aspirin: Secondary | ICD-10-CM | POA: Diagnosis not present

## 2023-10-10 DIAGNOSIS — K219 Gastro-esophageal reflux disease without esophagitis: Secondary | ICD-10-CM | POA: Diagnosis present

## 2023-10-10 LAB — BASIC METABOLIC PANEL
Anion gap: 13 (ref 5–15)
BUN: 10 mg/dL (ref 8–23)
CO2: 25 mmol/L (ref 22–32)
Calcium: 8.6 mg/dL — ABNORMAL LOW (ref 8.9–10.3)
Chloride: 85 mmol/L — ABNORMAL LOW (ref 98–111)
Creatinine, Ser: 0.51 mg/dL (ref 0.44–1.00)
GFR, Estimated: >60 mL/min (ref >60)
Glucose, Bld: 284 mg/dL — ABNORMAL HIGH (ref 70–99)
Potassium: 3.7 mmol/L (ref 3.5–5.1)
Sodium: 123 mmol/L — ABNORMAL LOW (ref 135–145)

## 2023-10-10 LAB — OSMOLALITY, URINE: Osmolality, Ur: 236 mosm/kg — ABNORMAL LOW (ref 300–900)

## 2023-10-10 LAB — GLUCOSE, CAPILLARY
Glucose-Capillary: 276 mg/dL — ABNORMAL HIGH (ref 70–99)
Glucose-Capillary: 206 mg/dL — ABNORMAL HIGH (ref 70–99)
Glucose-Capillary: 154 mg/dL — ABNORMAL HIGH (ref 70–99)
Glucose-Capillary: 327 mg/dL — ABNORMAL HIGH (ref 70–99)

## 2023-10-10 LAB — CBC
HCT: 32 % — ABNORMAL LOW (ref 36.0–46.0)
Hemoglobin: 11 g/dL — ABNORMAL LOW (ref 12.0–15.0)
MCH: 29.6 pg (ref 26.0–34.0)
MCHC: 34.4 g/dL (ref 30.0–36.0)
MCV: 86 fL (ref 80.0–100.0)
Platelets: 363 10*3/uL (ref 150–400)
RBC: 3.72 MIL/uL — ABNORMAL LOW (ref 3.87–5.11)
RDW: 12.5 % (ref 11.5–15.5)
WBC: 8.2 10*3/uL (ref 4.0–10.5)
nRBC: 0 % (ref 0.0–0.2)

## 2023-10-10 LAB — HIV ANTIBODY (ROUTINE TESTING W REFLEX): HIV Screen 4th Generation wRfx: NONREACTIVE

## 2023-10-10 LAB — TSH: TSH: 1.88 u[IU]/mL (ref 0.350–4.500)

## 2023-10-10 LAB — SODIUM: Sodium: 124 mmol/L — ABNORMAL LOW (ref 135–145)

## 2023-10-10 LAB — SODIUM, URINE, RANDOM: Sodium, Ur: 21 mmol/L

## 2023-10-10 LAB — OSMOLALITY: Osmolality: 268 mosm/kg — ABNORMAL LOW (ref 275–295)

## 2023-10-10 MED ORDER — INSULIN ASPART 100 UNIT/ML IJ SOLN
3.0000 [IU] | Freq: Three times a day (TID) | INTRAMUSCULAR | Status: DC
  Administered 2023-10-10: 3 [IU] via SUBCUTANEOUS
  Filled 2023-10-10: qty 1

## 2023-10-10 MED ORDER — INSULIN ASPART 100 UNIT/ML IJ SOLN
7.0000 [IU] | Freq: Three times a day (TID) | INTRAMUSCULAR | Status: DC
  Administered 2023-10-10: 7 [IU] via SUBCUTANEOUS
  Filled 2023-10-10: qty 1

## 2023-10-10 MED ORDER — SODIUM CHLORIDE 1 G PO TABS
1.0000 g | ORAL_TABLET | Freq: Two times a day (BID) | ORAL | Status: DC
  Administered 2023-10-10 – 2023-10-12 (×4): 1 g via ORAL
  Filled 2023-10-10 (×4): qty 1

## 2023-10-10 MED ORDER — DEXAMETHASONE SODIUM PHOSPHATE 10 MG/ML IJ SOLN
10.0000 mg | Freq: Once | INTRAMUSCULAR | Status: AC
  Administered 2023-10-10: 10 mg via INTRAVENOUS
  Filled 2023-10-10: qty 1

## 2023-10-10 MED ORDER — SODIUM CHLORIDE 0.9 % IV BOLUS
500.0000 mL | Freq: Once | INTRAVENOUS | Status: AC
  Administered 2023-10-10: 500 mL via INTRAVENOUS

## 2023-10-10 MED ORDER — SODIUM CHLORIDE 0.9 % IV SOLN: INTRAVENOUS | Status: DC

## 2023-10-10 MED ORDER — PREDNISONE 50 MG PO TABS: 60.0000 mg | ORAL_TABLET | Freq: Every day | ORAL | Status: DC

## 2023-10-10 NOTE — Plan of Care (Signed)

## 2023-10-10 NOTE — Progress Notes (Signed)
Progress Note   Patient: Karla Patton [id number] DOB: [birth year] DOA: 10/09/2023     0 DOS: the patient was seen and examined on 10/10/2023   Brief hospital course: HPI on admission 10/09/23: Karla Patton is a 72 y.o. female with medical history significant of type 1 diabetes with retinopathy, autonomic neuropathy, HTN, HLD, GERD, OSA, hypothyroidism, lumbar spinal stenosis s/p lumbar fusion in 2022 who presented to the ED today for evaluation of severe Right lower abdominal / groin pain radiating down the right leg to the knee x about 4 days.  Also reported intractable nausea and vomiting since yesterday, with 7-8 episodes.  ...  See H&P for full HPI on admission and ED course.  Pt was admitted for further evaluation and management of hyponatremia (initial sodium 123) and suspected lumbar radiculopathy.  Further hospital course and management as outlined below.    Assessment and Plan:  * Hyponatremia Na 123 on admission, down from 129 on 09/29/23. Pt reports chronic mild hyponatremia. Baseline Na appears 130-134.  Given vomiting, suspect hypovolemic in setting of continued Lasix use.  Pt appears dry to euvolemic on exam. Na 123 >> 124 --Hold Lasix --Initially on conservative fluids @ 40 cc, sodium unchanged at 123 this AM ----500 cc NS fluid challenge, sodium improved slightly to 124 --Serial BMP's --Add on serum osm, TSH level, check AM cortisol   Acute pain of right thigh Due to Lumbar Radiculopathy Pt presented with RLQ/right groin and thigh pain radiating down to her knee. Hx of lumbar stenosis s/p  L4-5  posterior lateral interbody fusion by Dr. Lovell Sheehan  of [facility] on 03/18/2021.  --MRI lumbar spine shows mod-severe RIGHT foraminal stenosis at L3-4, moderate LEFT foraminal stenosis L2-3, mild-mod canal stenosis L2-3, mild canal stenosis L3-4, prior L4-5 fusion with patent canal and bilateral foramina --Start steroids - 10 mg Decadron  today, start Medrol dose pack tomorrow --Neurosurgery - discussed case, consult tomorrow --Pt would likely benefit from [facility] --Continue pain control with IV Toradol PRN, PO Norco PRN --Increased home Lyrica (from bedtime to BID for now) --Adjust insulin for steroid-induced hyperglycemia as below  Dizziness Home meclizine PRN  Allergic rhinitis Flonase  Gastroesophageal reflux disease without esophagitis PPI   Chronic lumbar pain With acute worsening along with suspected radicular symptoms as above.  Spinal stenosis of lumbar region With acute radicular type pain as outlined above.  Hyperlipidemia due to type 1 diabetes mellitus (HCC) On statin  Hypertension associated with diabetes (HCC) Hold Lasix with hyponatremia PRN hydralazine for now  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 (HCC) Semglee 12 units daily Add scheduled Novolog 4 >> 7 units TID Sliding scale Novolog Anticipate postprandial hyperglycemic and escalate insulin accordingly.  Hypothyroidism Synthroid        Subjective: Pt seen with family at bedside today.  She reports pain somewhat improved today but does remain.  N/V better.  Discussed MRI findings and next steps. Pt agreeable to steroids, discussed will need to escalate insulin temporarily.   Physical Exam: Vitals:   10/09/23 1831 10/09/23 2143 10/10/23 0739 10/10/23 1608  BP: (!) 172/74 (!) 182/83 (!) 186/80 (!) 155/72  Pulse: 96 94 75 69  Resp: 18 16 16 17   Temp: 98.4 F (36.9 C) 97.8 F (36.6 C) 97.9 F (36.6 C) 98.2 F (36.8 C)  TempSrc: Oral   Oral  SpO2: 98% 95% 97% 96%  Weight:      Height:  General exam: awake, alert, no acute distress HEENT: moist mucus membranes, hearing grossly normal  Respiratory system: CTAB, no wheezes, rales or rhonchi, normal respiratory effort. Cardiovascular system: normal S1/S2, RRR, no JVD, murmurs, rubs, gallops, no pedal edema.    Gastrointestinal system: soft, NT, ND, no HSM felt, +bowel sounds. Central nervous system: A&O x 4. no gross focal neurologic deficits, normal speech Extremities: moves all , no edema, normal tone Skin: dry, intact, normal temperature Psychiatry: normal mood, congruent affect, judgement and insight appear normal  Data Reviewed:  Notable labs Na 123 >> 124 after fluid challenge Cl 85, glucose 284, Ca 8.6,  Serum osm 268 low Hbg 11.0 TSH normal 1.880 Urine osm 236, urine Na 21  Family Communication: at bedside on rounds  Disposition: Status is: Inpatient Remains inpatient appropriate because: persistent hyponatremia, ongoing evaluation for lumbar radiculopathy   Planned Discharge Destination: Home    Time spent: 45 minutes  Author: Pennie Banter, DO 10/10/2023 7:40 PM  For on call review [email or web address].

## 2023-10-10 NOTE — Hospital Course (Signed)
HPI on admission 10/09/23: Karla Patton is a 72 y.o. female with medical history significant of type 1 diabetes with retinopathy, autonomic neuropathy, HTN, HLD, GERD, OSA, hypothyroidism, lumbar spinal stenosis s/p lumbar fusion in 2022 who presented to the ED today for evaluation of severe Right lower abdominal / groin pain radiating down the right leg to the knee x about 4 days.  Also reported intractable nausea and vomiting since yesterday, with 7-8 episodes.  ...  See H&P for full HPI on admission and ED course.  Pt was admitted for further evaluation and management of hyponatremia (initial sodium 123) and suspected lumbar radiculopathy.  Further hospital course and management as outlined below.

## 2023-10-11 ENCOUNTER — Encounter: Payer: Self-pay | Admitting: Internal Medicine

## 2023-10-11 DIAGNOSIS — E871 Hypo-osmolality and hyponatremia: Secondary | ICD-10-CM

## 2023-10-11 LAB — CORTISOL-AM, BLOOD: Cortisol - AM: 2 ug/dL — ABNORMAL LOW (ref 6.7–22.6)

## 2023-10-11 LAB — BASIC METABOLIC PANEL
Anion gap: 13 (ref 5–15)
BUN: 18 mg/dL (ref 8–23)
CO2: 25 mmol/L (ref 22–32)
Calcium: 8.8 mg/dL — ABNORMAL LOW (ref 8.9–10.3)
Chloride: 87 mmol/L — ABNORMAL LOW (ref 98–111)
Creatinine, Ser: 0.63 mg/dL (ref 0.44–1.00)
GFR, Estimated: >60 mL/min (ref >60)
Glucose, Bld: 333 mg/dL — ABNORMAL HIGH (ref 70–99)
Potassium: 4 mmol/L (ref 3.5–5.1)
Sodium: 125 mmol/L — ABNORMAL LOW (ref 135–145)

## 2023-10-11 LAB — GLUCOSE, CAPILLARY
Glucose-Capillary: 330 mg/dL — ABNORMAL HIGH (ref 70–99)
Glucose-Capillary: 316 mg/dL — ABNORMAL HIGH (ref 70–99)
Glucose-Capillary: 180 mg/dL — ABNORMAL HIGH (ref 70–99)
Glucose-Capillary: 149 mg/dL — ABNORMAL HIGH (ref 70–99)

## 2023-10-11 LAB — SODIUM: Sodium: 126 mmol/L — ABNORMAL LOW (ref 135–145)

## 2023-10-11 MED ORDER — METHYLPREDNISOLONE 4 MG PO TBPK
8.0000 mg | ORAL_TABLET | Freq: Every morning | ORAL | Status: AC
  Administered 2023-10-11: 8 mg via ORAL
  Filled 2023-10-11: qty 21

## 2023-10-11 MED ORDER — INSULIN GLARGINE-YFGN 100 UNIT/ML ~~LOC~~ SOLN
10.0000 [IU] | Freq: Every day | SUBCUTANEOUS | Status: DC
  Administered 2023-10-12: 10 [IU] via SUBCUTANEOUS
  Filled 2023-10-11: qty 0.1

## 2023-10-11 MED ORDER — METHYLPREDNISOLONE 4 MG PO TBPK
4.0000 mg | ORAL_TABLET | ORAL | Status: AC
  Administered 2023-10-11: 4 mg via ORAL

## 2023-10-11 MED ORDER — METHYLPREDNISOLONE 4 MG PO TBPK: 4.0000 mg | ORAL_TABLET | Freq: Four times a day (QID) | ORAL | Status: DC

## 2023-10-11 MED ORDER — IRBESARTAN 150 MG PO TABS
75.0000 mg | ORAL_TABLET | Freq: Every day | ORAL | Status: DC
  Administered 2023-10-11 – 2023-10-12 (×2): 75 mg via ORAL
  Filled 2023-10-11 (×2): qty 1

## 2023-10-11 MED ORDER — INSULIN ASPART 100 UNIT/ML IJ SOLN
9.0000 [IU] | Freq: Three times a day (TID) | INTRAMUSCULAR | Status: DC
  Filled 2023-10-11: qty 1

## 2023-10-11 MED ORDER — INSULIN ASPART 100 UNIT/ML IJ SOLN
12.0000 [IU] | Freq: Three times a day (TID) | INTRAMUSCULAR | Status: DC
  Administered 2023-10-12 (×2): 12 [IU] via SUBCUTANEOUS
  Filled 2023-10-11 (×2): qty 1

## 2023-10-11 MED ORDER — METHYLPREDNISOLONE 4 MG PO TBPK
4.0000 mg | ORAL_TABLET | Freq: Three times a day (TID) | ORAL | Status: DC
  Administered 2023-10-12 (×2): 4 mg via ORAL

## 2023-10-11 MED ORDER — METHYLPREDNISOLONE 4 MG PO TBPK
8.0000 mg | ORAL_TABLET | Freq: Every evening | ORAL | Status: AC
Start: 2023-10-11 — End: 2023-10-11
  Administered 2023-10-11: 8 mg via ORAL

## 2023-10-11 MED ORDER — INSULIN ASPART 100 UNIT/ML IJ SOLN
0.0000 [IU] | Freq: Three times a day (TID) | INTRAMUSCULAR | Status: DC
  Administered 2023-10-11: 4 [IU] via SUBCUTANEOUS
  Administered 2023-10-11: 15 [IU] via SUBCUTANEOUS
  Filled 2023-10-11 (×4): qty 1

## 2023-10-11 MED ORDER — METHYLPREDNISOLONE 4 MG PO TBPK: 8.0000 mg | ORAL_TABLET | Freq: Every evening | ORAL | Status: DC

## 2023-10-11 MED ORDER — INSULIN ASPART 100 UNIT/ML IJ SOLN: 0.0000 [IU] | Freq: Every day | INTRAMUSCULAR | Status: DC

## 2023-10-11 NOTE — Progress Notes (Signed)
Transition of Care [facility]) - Inpatient Brief Assessment   Patient Details  Name: Karla Patton MRN: [id number] Date of Birth: 07/10/1951  [facility]) CM/SW Contact:    Bing Quarry, RN Phone Number: 10/11/2023, 12:12 PM   Clinical Narrative: 10/20: Ed on 10/18 from [facility] in [place], [place] for evaluation of severe Right lower abdominal / groin pain radiating down the right leg to the knee x about 4 days. Htx of back surgery. Per provider notes, low NA, and suspected lumbar radiculopathy. No TOC consult as yet but TOC to follow for needs. No SDOH flags noted.  Gabriel Cirri MSN RN CM  Transitions of Care Department [facility] [phone] Weekends Only     Transition of Care Asessment: Insurance and Status: Insurance coverage has been reviewed Patient has primary care physician: Yes Home environment has been reviewed: From home Prior level of function:: Htx of back surgery. Prior/Current Home Services: No current home services Social Determinants of Health Reivew: SDOH reviewed no interventions necessary Readmission risk has been reviewed: Yes Transition of care needs: transition of care needs identified, TOC will continue to follow

## 2023-10-11 NOTE — Plan of Care (Signed)

## 2023-10-11 NOTE — Plan of Care (Signed)
  Problem: Education: Goal: Knowledge of General Education information will improve Description: Including pain rating scale, medication(s)/side effects and non-pharmacologic comfort measures Outcome: Progressing   Problem: Health Behavior/Discharge Planning: Goal: Ability to manage health-related needs will improve Outcome: Progressing   Problem: Clinical Measurements: Goal: Ability to maintain clinical measurements within normal limits will improve Outcome: Progressing   Problem: Activity: Goal: Risk for activity intolerance will decrease Outcome: Progressing   Problem: Nutrition: Goal: Adequate nutrition will be maintained Outcome: Progressing   Problem: Coping: Goal: Ability to adjust to condition or change in health will improve Outcome: Progressing

## 2023-10-11 NOTE — Progress Notes (Addendum)
Progress Note   Patient: Karla Patton WUJ:[id number] DOB: 1951-09-18 DOA: 10/09/2023     1 DOS: the patient was seen and examined on 10/11/2023   Brief hospital course: HPI on admission 10/09/23: Karla Patton is a 72 y.o. female with medical history significant of type 1 diabetes with retinopathy, autonomic neuropathy, HTN, HLD, GERD, OSA, hypothyroidism, lumbar spinal stenosis s/p lumbar fusion in 2022 who presented to the ED today for evaluation of severe Right lower abdominal / groin pain radiating down the right leg to the knee x about 4 days.  Also reported intractable nausea and vomiting since yesterday, with 7-8 episodes.  ...  See H&P for full HPI on admission and ED course.  Pt was admitted for further evaluation and management of hyponatremia (initial sodium 123) and suspected lumbar radiculopathy.  Further hospital course and management as outlined below.    Assessment and Plan:  * Hyponatremia Na 123 on admission, down from 129 on 09/29/23. Pt reports chronic mild hyponatremia. Baseline Na appears 130-134.  Given vomiting, suspect hypovolemic in setting of continued Lasix use.  Pt appears dry to euvolemic on exam. Na 123 >> 124 >> 125 >> 126 on IV fluids --Hold Lasix --Continue NS @ 75 cc/hr --Serial BMP's  Acute pain of right thigh Due to Lumbar Radiculopathy Pt presented with RLQ/right groin and thigh pain radiating down to her knee. Hx of lumbar stenosis s/p  L4-5  posterior lateral interbody fusion by Dr. Lovell Sheehan  of [facility] on 03/18/2021.  --MRI lumbar spine shows mod-severe RIGHT foraminal stenosis at L3-4, moderate LEFT foraminal stenosis L2-3, mild-mod canal stenosis L2-3, mild canal stenosis L3-4, prior L4-5 fusion with patent canal and bilateral foramina --Started steroids - 10 mg Decadron x 1 on 10/19, started on Medrol dose pack today --Neurosurgery - discussed case, recommend outpatient follow up for ESI --Continue pain  control with IV Toradol PRN, PO Norco PRN --Increased home Lyrica (from bedtime to BID for now) --Adjust insulin for steroid-induced hyperglycemia as below  Dizziness Home meclizine PRN  Allergic rhinitis Flonase  Gastroesophageal reflux disease without esophagitis PPI   Chronic lumbar pain With acute worsening along with suspected radicular symptoms as above.  Spinal stenosis of lumbar region With acute radicular type pain as outlined above.  Hyperlipidemia due to type 1 diabetes mellitus (HCC) On statin  Hypertension associated with diabetes (HCC) Holding Lasix with hyponatremia Start irbesartan 75 mg daily  PRN hydralazine for now  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 (HCC) Semglee 12 units daily Add scheduled Novolog 4 >> 7 units TID Sliding scale Novolog Anticipate postprandial hyperglycemic and escalate insulin accordingly.  Hypothyroidism Synthroid        Subjective: Paged by nursing multiple times this AM, pt wanting to be seen asap and wants to go home, threatening to leave AMA.  When seen on rounds today with husband at bedside, pt angry and wanting to leave the hospital.  States her pain is resolved which was her only reason for coming in the first place.  Stated she is going home today and would not listen to my explanation for why I am not prepared to d/c her today, or to plan of care to manage her low sodium level.  Pt stated "nothing being done" for my sodium and that it "always runs low".  IV fluids were disconnected as pt had refused them since she was going home.  She also refused AM dose  of steroids and insulin (glucose in 300's).  She wanted to sign out AMA and stated that she would do so despite my explaining risks to her health if hyponatremia worsens.    Patient ultimately changed her mind and remains admitted for IV fluids for hyponatremia.   Physical Exam: Vitals:   10/11/23 0442  10/11/23 0451 10/11/23 0806 10/11/23 1347  BP:  (!) 161/84 (!) 166/94 (!) 152/77  Pulse:  98 (!) 106 97  Resp:  16 16 18   Temp:  97.9 F (36.6 C) 97.7 F (36.5 C) 98.1 F (36.7 C)  TempSrc:  Oral Oral Oral  SpO2:  98% 99% 100%  Weight: 74.1 kg     Height:       General exam: awake, alert, no acute distress HEENT: moist mucus membranes, hearing grossly normal  Respiratory system: on room air, normal respiratory effort. Cardiovascular system: RRR, no pedal edema.   Central nervous system: A&O x 4. no gross focal neurologic deficits, normal speech Extremities: moves all , no edema, normal tone Skin: dry, intact, no rashes seen Psychiatry: irritable anxious mood, congruent affect, abnormal judgement and insight   Data Reviewed:  Notable labs Na 123 >> 124 >> 125 (on fluids & improving) Cl 87, glucose 333, Ca 8.8,   Serum osm 268 low TSH normal 1.880 Urine osm 236, urine Na 21 AM cortisol low 2.0 (but drawn after steroids)   Family Communication: husband at bedside on rounds  Disposition: Status is: Inpatient Remains inpatient appropriate because: persistent hyponatremia on IV fluids    Planned Discharge Destination: Home    Time spent: 50 minutes including time at bedside and in coordination of care with staff, review and interpretation of labs, diagnostics and in documentation  Author: Pennie Banter, DO 10/11/2023 5:34 PM  For on call review [email or web address].

## 2023-10-12 DIAGNOSIS — E871 Hypo-osmolality and hyponatremia: Secondary | ICD-10-CM | POA: Diagnosis not present

## 2023-10-12 DIAGNOSIS — M5416 Radiculopathy, lumbar region: Secondary | ICD-10-CM | POA: Diagnosis present

## 2023-10-12 LAB — BASIC METABOLIC PANEL
Anion gap: 9 (ref 5–15)
BUN: 17 mg/dL (ref 8–23)
CO2: 27 mmol/L (ref 22–32)
Calcium: 8.7 mg/dL — ABNORMAL LOW (ref 8.9–10.3)
Chloride: 92 mmol/L — ABNORMAL LOW (ref 98–111)
Creatinine, Ser: 0.71 mg/dL (ref 0.44–1.00)
GFR, Estimated: >60 mL/min (ref >60)
Glucose, Bld: 274 mg/dL — ABNORMAL HIGH (ref 70–99)
Potassium: 4.2 mmol/L (ref 3.5–5.1)
Sodium: 128 mmol/L — ABNORMAL LOW (ref 135–145)

## 2023-10-12 LAB — SODIUM: Sodium: 127 mmol/L — ABNORMAL LOW (ref 135–145)

## 2023-10-12 LAB — GLUCOSE, CAPILLARY
Glucose-Capillary: 285 mg/dL — ABNORMAL HIGH (ref 70–99)
Glucose-Capillary: 254 mg/dL — ABNORMAL HIGH (ref 70–99)

## 2023-10-12 MED ORDER — METHYLPREDNISOLONE 4 MG PO TBPK: ORAL_TABLET | ORAL | 0 refills | Status: DC

## 2023-10-12 MED ORDER — NOVOLOG FLEXPEN 100 UNIT/ML ~~LOC~~ SOPN: PEN_INJECTOR | SUBCUTANEOUS | Status: DC

## 2023-10-12 MED ORDER — PREGABALIN 100 MG PO CAPS: 100.0000 mg | ORAL_CAPSULE | Freq: Two times a day (BID) | ORAL | 0 refills | Status: DC | PRN

## 2023-10-12 MED ORDER — FUROSEMIDE 20 MG PO TABS: ORAL_TABLET | ORAL | 0 refills | Status: DC

## 2023-10-12 MED ORDER — POTASSIUM CHLORIDE CRYS ER 20 MEQ PO TBCR: 20.0000 meq | EXTENDED_RELEASE_TABLET | Freq: Every day | ORAL | Status: DC | PRN

## 2023-10-12 MED ORDER — IRBESARTAN 75 MG PO TABS: 75.0000 mg | ORAL_TABLET | Freq: Every day | ORAL | 2 refills | Status: DC

## 2023-10-12 NOTE — Plan of Care (Signed)
[facility] Tonsillectomy/Adenoidectomy, Postoperative PEDS care plan entered in error.

## 2023-10-12 NOTE — Plan of Care (Signed)

## 2023-10-12 NOTE — Discharge Summary (Signed)
Physician Discharge Summary   Patient: Karla Patton MRN: [id number] DOB: [birth year]  Admit date:     10/09/2023  Discharge date: 10/12/2023  Discharge Physician: Pennie Banter   PCP: Bethanie Dicker, NP   Recommendations at discharge:   Follow up with Primary Care within 1 week Repeat BMP, Mg, CBC and closely follow hyponatremia Follow up on blood pressure and medication regimen adjustments as indicated  Discharge Diagnoses: Principal Problem:   Hyponatremia Active Problems:   Hypothyroidism   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 (HCC)   Hypertension associated with diabetes (HCC)   Hyperlipidemia due to type 1 diabetes mellitus (HCC)   Spinal stenosis of lumbar region   Chronic lumbar pain   Gastroesophageal reflux disease without esophagitis   Allergic rhinitis   Dizziness   Acute pain of right thigh   Acute lumbar radiculopathy  Resolved Problems:   * No resolved hospital problems. [facility] Course: HPI on admission 10/09/23: Karla Patton is a 72 y.o. female with medical history significant of type 1 diabetes with retinopathy, autonomic neuropathy, HTN, HLD, GERD, OSA, hypothyroidism, lumbar spinal stenosis s/p lumbar fusion in 2022 who presented to the ED today for evaluation of severe Right lower abdominal / groin pain radiating down the right leg to the knee x about 4 days.  Also reported intractable nausea and vomiting since yesterday, with 7-8 episodes.  ...  See H&P for full HPI on admission and ED course.  Pt was admitted for further evaluation and management of hyponatremia (initial sodium 123) and suspected lumbar radiculopathy.  Further hospital course and management as outlined below.    10/21 -- pt feels well this AM.  Pain significantly improved with steroids, denies current pain. Sodium improved with IV hydration and holding Lasix. Pt is medically stable for d/c home today  with PCP to follow up closely and Neurosurgery follow up for consideration of ESI for lumbar radiculopathy.    Assessment and Plan:  * Hyponatremia Na 123 on admission, down from 129 on 09/29/23. Pt reports chronic mild hyponatremia. Baseline Na appears 130-134.  Given vomiting, suspect hypovolemic in setting of continued Lasix use.  Pt appears dry to euvolemic on exam. Na 123 >> 124 >> 125 >> 126 >> 127 on IV fluids --Hold Lasix --Treated with IV NS and improving --Close PCP follow up for BMP   Acute pain of right thigh Due to Lumbar Radiculopathy Pt presented with RLQ/right groin and thigh pain radiating down to her knee. Hx of lumbar stenosis s/p  L4-5  posterior lateral interbody fusion by Dr. Lovell Sheehan  of [facility] on 03/18/2021.  --MRI lumbar spine shows mod-severe RIGHT foraminal stenosis at L3-4, moderate LEFT foraminal stenosis L2-3, mild-mod canal stenosis L2-3, mild canal stenosis L3-4, prior L4-5 fusion with patent canal and bilateral foramina --Started steroids - 10 mg Decadron x 1 on 10/19 >> Medrol dose pack taper --Neurosurgery - discussed case, recommend outpatient follow up for ESI --Continue pain control with IV Toradol PRN, PO Norco PRN --Increased home Lyrica (from bedtime to BID for now) --Adjust insulin for steroid-induced hyperglycemia as below   Dizziness Home meclizine PRN   Allergic rhinitis Flonase   Gastroesophageal reflux disease without esophagitis PPI    Chronic lumbar pain With acute worsening along with suspected radicular symptoms as above.   Spinal stenosis of lumbar region With acute radicular type pain as outlined above.   Hyperlipidemia due to type  1 diabetes mellitus (HCC) On statin   Hypertension associated with diabetes (HCC) Held Lasix with hyponatremia -- resume daily AS NEEDED at d/c Started irbesartan 75 mg daily  PCP follow up Recommend home BP monitoring until follow up   Type 1 diabetes mellitus with  mild nonproliferative retinopathy of both eyes without macular edema (HCC) Insulin regimen was increased as below for Steroid-induced hyperglycemia Close PCP follow up recommended    Hypothyroidism Synthroid         Consultants: None Procedures performed: None  Disposition: Home Diet recommendation:  Discharge Diet Orders (From admission, onward)     Start     Ordered   10/12/23 0000  Diet - low sodium & carb modified      10/12/23 1520            DISCHARGE MEDICATION: Allergies as of 10/12/2023       Reactions   Lisinopril Cough        Medication List     TAKE these medications    ABC PLUS SENIOR ADULTS 50+ PO Take 1 tablet by mouth daily.   acetaminophen 650 MG CR tablet Commonly known as: TYLENOL Take 650 mg by mouth every 8 (eight) hours as needed for pain.   APPLE CIDER VINEGAR PO Take 600 mg by mouth daily.   aspirin EC 81 MG tablet Take 81 mg by mouth daily. Takes at bedtime   beta carotene 29562 UNIT capsule Take 25,000 Units by mouth daily.   fluticasone 50 MCG/ACT nasal spray Commonly known as: FLONASE SPRAY 2 SPRAYS INTO EACH NOSTRIL EVERY DAY   furosemide 20 MG tablet Commonly known as: LASIX Take 1 tablet (20 mg) by mouth DAILY AS NEEDED for weight gain or leg swelling What changed: additional instructions   High Potency Iron 65 MG Tabs Take 1 tablet by mouth daily.   irbesartan 75 MG tablet Commonly known as: AVAPRO Take 1 tablet (75 mg total) by mouth daily. Start taking on: October 13, 2023   Lantus SoloStar 100 UNIT/ML Solostar Pen Generic drug: insulin glargine Inject 12 Units into the skin daily.   levothyroxine 100 MCG tablet Commonly known as: SYNTHROID Take 1 tablet (100 mcg total) by mouth daily before breakfast.   MAGNESIUM-ZINC PO Take 1 tablet by mouth daily.   meclizine 12.5 MG tablet Commonly known as: ANTIVERT Take 1 tablet (12.5 mg total) by mouth 3 (three) times daily as needed for dizziness.    methylPREDNISolone 4 MG Tbpk tablet Commonly known as: MEDROL DOSEPAK Take 1 tablet (4 mg) with supper 10/21, take 2 tablets (8 mg) at bedtime 10/21. Take 1 tablet (4 mg) FOUR times daily with meals and at bedtime on 10/22 and 10/23 Take 1 table (4 mg) TWICE daily on 10/24   NovoLOG FlexPen 100 UNIT/ML FlexPen Generic drug: insulin aspart Inject 12 units PLUS 0-20 units by sliding scale THREE TIMES DAILY WITH MEALS  SLIDING SCALE WHILE ON STEROIDS:  For CBG 70-120: 0 units For CBG 121-150: 3 units For CBG 151-200: 4 units For CBG 201-250: 7 units For CBG 251-300: 11 units For CBG 301-350: 15 units For CBG 351-400: 20 units For CBG > 400: 25 units and call your doctor What changed: additional instructions   omeprazole 20 MG tablet Commonly known as: PRILOSEC OTC Take 20 mg by mouth daily.   potassium chloride SA 20 MEQ tablet Commonly known as: KLOR-CON M Take 1 tablet (20 mEq total) by mouth daily as needed. Take along with lasix.  What changed:  when to take this reasons to take this additional instructions   pregabalin 225 MG capsule Commonly known as: Lyrica Take 1 capsule (225 mg total) by mouth at bedtime. What changed: Another medication with the same name was added. Make sure you understand how and when to take each.   pregabalin 100 MG capsule Commonly known as: Lyrica Take 1 capsule (100 mg total) by mouth 2 (two) times daily as needed for up to 15 days (for increased back or thigh pain). Continue usual dose 225 mg at bedtime What changed: You were already taking a medication with the same name, and this prescription was added. Make sure you understand how and when to take each.   rosuvastatin 10 MG tablet Commonly known as: CRESTOR Take 1 tablet (10 mg total) by mouth daily.   Vitamin D3 250 MCG (10000 UT) capsule Take 10,000 Units by mouth daily.        Follow-up Information     Bethanie Dicker, NP. Go on 10/20/2023.   Specialty: Nurse Practitioner Why:  Tuesday, 10/29 at 11:00 a.m. Hospital follow up and repeat labs in 1 week Contact information: [address] [place] [address] [place] [address] [phone]         Lovenia Kim, MD. Go on 10/28/2023.   Specialty: Neurosurgery Why: Wednesday, 11/6 at 9:00 a.m. for lumbar radiculopathy, consideration for [facility] Contact information: [address] [place] [address] [phone]                Discharge Exam: Filed Weights   10/09/23 1223 10/11/23 0442 10/12/23 0708  Weight: 78.9 kg 74.1 kg 75 kg   General exam: awake, alert, no acute distress HEENT: atraumatic, clear conjunctiva, anicteric sclera, moist mucus membranes, hearing grossly normal  Respiratory system: CTAB, no wheezes, rales or rhonchi, normal respiratory effort. Cardiovascular system: normal S1/S2, RRR, no JVD, murmurs, rubs, gallops, no pedal edema.   Gastrointestinal system: soft, NT, ND, no HSM felt, +bowel sounds. Central nervous system: A&O x 3. no gross focal neurologic deficits, normal speech Extremities: moves all, no edema, normal tone Skin: dry, intact, normal temperature, normal color, No rashes, lesions or ulcers Psychiatry: normal mood, congruent affect, judgement and insight appear normal   Condition at discharge: stable  The results of significant diagnostics from this hospitalization (including imaging, microbiology, ancillary and laboratory) are listed below for reference.   Imaging Studies: MR LUMBAR SPINE WO CONTRAST  Result Date: 10/10/2023 CLINICAL DATA:  Lumbar radiculopathy, no red flags, no prior management EXAM: MRI LUMBAR SPINE WITHOUT CONTRAST TECHNIQUE: Multiplanar, multisequence MR imaging of the lumbar spine was performed. No intravenous contrast was administered. COMPARISON:  MRI 09/17/2020 FINDINGS: Segmentation:  Standard. Alignment: Fixed grade 1 anterolisthesis of L4 on L5. There is also mild grade 1 anterolisthesis of L5 on S1. Vertebrae: Posterior and  interbody fusion at L4-5. No acute fracture. No evidence of discitis peer mild discogenic endplate marrow changes at L3-4. No suspicious bone lesion. Conus medullaris and cauda equina: Conus extends to the L1-2 level. Conus and cauda equina appear normal. Paraspinal and other soft tissues: Negative. Disc levels: T12-L1: Unremarkable. L1-L2: Mild broad-based disc bulge.  No foraminal or canal stenosis. L2-L3: Annular disc bulge with mild bilateral facet arthropathy and ligamentum flavum buckling. Mild-moderate canal stenosis with moderate left and mild right foraminal stenosis. Findings have progressed from prior. L3-L4: Annular disc bulge with bilateral facet arthropathy and ligamentum flavum buckling. Mild canal stenosis with moderate-severe right and mild left  foraminal stenosis. Findings have progressed from prior. L4-L5: Interval fusion and decompression. Canal and bilateral foramina are widely patent. Appearance has improved compared to prior. L5-S1: Mild annular disc bulge and mild bilateral facet arthropathy. No canal stenosis. Borderline-mild bilateral foraminal stenosis, similar to prior. IMPRESSION: 1. Interval fusion and decompression at L4-5 with widely patent canal and bilateral foramina at this level. 2. Progressive adjacent segment degenerative changes at L2-3 and L3-4 with mild-moderate canal stenosis at L2-3 and mild canal stenosis at L3-4. 3. Moderate-severe right foraminal stenosis at L3-4. Moderate left foraminal stenosis at L2-3. Electronically Signed   By: Duanne Guess D.O.   On: 10/10/2023 15:13   CT ABDOMEN PELVIS W CONTRAST  Result Date: 10/09/2023 CLINICAL DATA:  Right lower quadrant pain. Patient reports low back pain radiating to the hip and knee for 4 days. EXAM: CT ABDOMEN AND PELVIS WITH CONTRAST TECHNIQUE: Multidetector CT imaging of the abdomen and pelvis was performed using the standard protocol following bolus administration of intravenous contrast. RADIATION DOSE  REDUCTION: This exam was performed according to the departmental dose-optimization program which includes automated exposure control, adjustment of the mA and/or kV according to patient size and/or use of iterative reconstruction technique. CONTRAST:  OMNIPAQUE IOHEXOL 300 MG/ML  SOLN COMPARISON:  CT 03/16/2018. Right upper quadrant ultrasound 09/02/2023 FINDINGS: Lower chest: No acute airspace disease or pleural effusion. Hepatobiliary: Enhancing focus in the right lobe of the liver series 2, image 25, slightly smaller than on prior exam. No new liver abnormality. Coarse calcification adjacent to the hepatic dome. Clips in the gallbladder fossa postcholecystectomy. No biliary dilatation. Pancreas: Mildly atrophic.  No ductal dilatation or inflammation. Spleen: Normal in size without focal abnormality. Adrenals/Urinary Tract: Normal adrenal glands. No hydronephrosis or perinephric edema. Homogeneous renal enhancement with symmetric excretion on delayed phase imaging. No renal calculi or suspicious lesion. Urinary bladder is physiologically distended without wall thickening. Stomach/Bowel: Patulous distal esophagus. Unremarkable appearance of the stomach. No small bowel obstruction or inflammation. Scattered colonic diverticula without diverticulitis. Descending colon is decompressed and not well assessed. Small volume of formed stool in the ascending colon. Diminutive normal appendix. No appendicitis. Vascular/Lymphatic: Aortic and branch atherosclerosis. No aneurysm. The portal vein is patent. No acute vascular findings. There are varicosities in the left anterior upper thigh as before. No enlarged lymph nodes in the abdomen or pelvis. Reproductive: Status post hysterectomy. No adnexal masses. Other: No ascites or free air. No abdominal wall or inguinal hernia. Musculoskeletal: L4-L5 posterior fusion hardware with interbody spacer. Multilevel degenerative disc disease with disc space narrowing, spurring, and  vacuum phenomenon. No acute osseous findings. IMPRESSION: 1. No acute abnormality or explanation for right lower quadrant pain. Normal appendix. 2. Colonic diverticulosis without diverticulitis. 3. Enhancing focus in the right lobe of the liver is slightly smaller than on prior exam, likely a flash filling hemangioma. No specific imaging follow-up is needed. Aortic Atherosclerosis (ICD10-I70.0). Electronically Signed   By: Narda Rutherford M.D.   On: 10/09/2023 16:24    Microbiology: Results for orders placed or performed during the hospital encounter of 03/18/21  Surgical pcr screen     Status: Abnormal   Collection Time: 03/18/21  8:14 AM   Specimen: Nasal Mucosa; Nasal Swab  Result Value Ref Range Status   MRSA, PCR NEGATIVE NEGATIVE Final   Staphylococcus aureus POSITIVE (A) NEGATIVE Final    Comment: (NOTE) The Xpert SA Assay (FDA approved for NASAL specimens in patients 2 years of age and older), is one component of a  comprehensive surveillance program. It is not intended to diagnose infection nor to guide or monitor treatment. Performed at [facility], [address] [place], [place] [address]     Labs: CBC: Recent Labs  Lab 10/09/23 1253 10/10/23 0538  WBC 9.5 8.2  NEUTROABS 5.8  --   HGB 12.2 11.0*  HCT 35.5* 32.0*  MCV 86.4 86.0  PLT 422* 363   Basic Metabolic Panel: Recent Labs  Lab 10/09/23 1253 10/10/23 0538 10/10/23 1516 10/11/23 0716 10/11/23 1646 10/12/23 0629  NA 123* 123* 124* 125* 126* 128*  K 4.0 3.7  --  4.0  --  4.2  CL 84* 85*  --  87*  --  92*  CO2 26 25  --  25  --  27  GLUCOSE 286* 284*  --  333*  --  274*  BUN 16 10  --  18  --  17  CREATININE 0.65 0.51  --  0.63  --  0.71  CALCIUM 9.4 8.6*  --  8.8*  --  8.7*   Liver Function Tests: Recent Labs  Lab 10/09/23 1253  AST 29  ALT 29  ALKPHOS 99  BILITOT 1.1  PROT 7.7  ALBUMIN 4.3   CBG: Recent Labs  Lab 10/11/23 1057 10/11/23 1745 10/11/23 2241 10/12/23 0736  10/12/23 1123  GLUCAP 316* 180* 149* 285* 254*    Discharge time spent: greater than 30 minutes.  Signed: Pennie Banter, DO [facility] 10/12/2023

## 2023-10-13 ENCOUNTER — Telehealth: Payer: Self-pay

## 2023-10-13 NOTE — Patient Instructions (Signed)
Visit Information  Thank you for taking time to visit with me today. Please don't hesitate to contact me if I can be of assistance to you before our next scheduled telephone appointment.  Our next appointment is by telephone on 10/20/23 at 1:00pm  Following is a copy of your care plan:   Goals Addressed             This Visit's Progress    TOC Care Plan       Current Barriers:  Medication management Obtaining and starting new medications , clarifying doses  Provider appointments with PCP, Neurology and Endocrinology  RNCM Clinical Goal(s):  Patient will take all medications exactly as prescribed and will call provider for medication related questions as evidenced by no missed medications and taking medication as directed  demonstrate ongoing self health care management ability with medication management, provider appointments medication adjustments  as evidenced by no missed or adverse side effects , no uncontrolled symptoms   through collaboration with RN Care manager, provider, and care team.   Interventions: Evaluation of current treatment plan related to  self management and patient's adherence to plan as established by provider  Transitions of Care:  New goal. Doctor Visits  - discussed the importance of doctor visits  Patient Goals/Self-Care Activities: Participate in Transition of Care Program/Attend [facility] scheduled calls Take all medications as prescribed Call pharmacy for medication refills 3-7 days in advance of running out of medications Perform all self care activities independently  Perform IADL's (shopping, preparing meals, housekeeping, managing finances) independently Call provider office for new concerns or questions   Follow Up Plan:  The patient has been provided with contact information for the care management team and has been advised to call with any health related questions or concerns.          Patient verbalizes understanding of instructions and care  plan provided today and agrees to view in MyChart. Active MyChart status and patient understanding of how to access instructions and care plan via MyChart confirmed with patient.     The patient has been provided with contact information for the care management team and has been advised to call with any health related questions or concerns.   Please call the care guide team at [phone] if you need to cancel or reschedule your appointment.   Please call the [place] National Suicide Prevention Lifeline: 903-593-0347 or TTY: (854)019-6010 TTY [phone]) to talk to a trained counselor if you are experiencing a Mental Health or Behavioral Health Crisis or need someone to talk to.  Susa Loffler , BSN, RN Care Management Coordinator [place]   [facility] [email or web address] Direct Dial: [phone]

## 2023-10-13 NOTE — Transitions of Care (Post Inpatient/ED Visit) (Signed)
10/13/2023  Name: Karla Patton MRN: [id number] DOB: [birth year]  Today's TOC FU Call Status: Today's TOC FU Call Status:: Successful TOC FU Call Completed TOC FU Call Complete Date: 10/13/23 Patient's Name and Date of Birth confirmed.  Transition Care Management Follow-up Telephone Call Date of Discharge: 10/12/23 Discharge Facility: [facility]) Type of Discharge: Inpatient Admission Primary Inpatient Discharge Diagnosis:: Hyponatremia How have you been since you were released from the hospital?: Better Any questions or concerns?: No  Items Reviewed: Did you receive and understand the discharge instructions provided?: Yes Medications obtained,verified, and reconciled?: Yes (Medications Reviewed) (She has not picked up Steroids, Na supplememt or Ibesartan-on hold at [facility].) Any new allergies since your discharge?: No Dietary orders reviewed?: Yes Type of Diet Ordered:: Reg Heart healthy Carb Modified Do you have support at home?: Yes People in Home: grandchild(ren), spouse Name of Support/Comfort Primary Source: Molly Maduro  spoise Gr daughter ( 15 yo)  Medications Reviewed Today: Medications Reviewed Today     Reviewed by Johnnette Barrios, RN (Registered Nurse) on 10/13/23 at 1127  Med List Status: <None>   Medication Order Taking? Sig Documenting Provider Last Dose Status Informant  acetaminophen (TYLENOL) 650 MG CR tablet [id number] Yes Take 650 mg by mouth every 8 (eight) hours as needed for pain. [provider] Taking Active   APPLE CIDER VINEGAR PO [id number] No Take 600 mg by mouth daily.   Patient not taking: Reported on 10/13/2023   [provider] Not Taking Active Self  aspirin EC 81 MG tablet [id number] Yes Take 81 mg by mouth daily. Takes at bedtime [provider] Taking Active Self  beta carotene 32951 UNIT capsule [id number] No Take 25,000 Units by mouth daily.  Patient not taking: Reported on 10/13/2023    [provider] Not Taking Active Self  Cholecalciferol (VITAMIN D3) 250 MCG (10000 UT) capsule [id number] Yes Take 10,000 Units by mouth daily. [provider] Taking Active Self  Ferrous Sulfate Dried (HIGH POTENCY IRON) 65 MG TABS [id number] Yes Take 1 tablet by mouth daily. [provider] Taking Active Self  fluticasone (FLONASE) 50 MCG/ACT nasal spray [id number] Yes SPRAY 2 SPRAYS INTO EACH NOSTRIL EVERY DAY Alfredia Ferguson, PA-C Taking Active   furosemide (LASIX) 20 MG tablet [id number] Yes Take 1 tablet (20 mg) by mouth DAILY AS NEEDED for weight gain or leg swelling Pennie Banter, DO Taking Active            Med Note Sharon Seller, Tejuan Gholson L   Tue Oct 13, 2023 11:22 AM) Aware of 3 lbs ON or 5 lbs / week  insulin aspart (NOVOLOG FLEXPEN) 100 UNIT/ML FlexPen [id number] Yes Inject 12 units PLUS 0-20 units by sliding scale THREE TIMES DAILY WITH MEALS  SLIDING SCALE WHILE ON STEROIDS:  For CBG 70-120: 0 units For CBG 121-150: 3 units For CBG 151-200: 4 units For CBG 201-250: 7 units For CBG 251-300: 11 units For CBG 301-350: 15 units For CBG 351-400: 20 units For CBG > 400: 25 units and call your doctor Pennie Banter, DO Taking Active            Med Note Sharon Seller, Garry Bochicchio L   Tue Oct 13, 2023 11:23 AM) States she does not routinely take 12 units She uses SSI coverage only   insulin glargine (LANTUS SOLOSTAR) 100 UNIT/ML Solostar Pen [id number] Yes Inject 12 Units into the skin daily. Shamleffer, Konrad Dolores, MD Taking Active  Med Note (Zelene Barga L   Tue Oct 13, 2023 11:24 AM) States she is using 20 units at bedtime while she in on Medrol   irbesartan (AVAPRO) 75 MG tablet [id number] Yes Take 1 tablet (75 mg total) by mouth daily. Pennie Banter, DO Taking Active            Med Note Sharon Seller, Nickoles Gregori L   Tue Oct 13, 2023 11:25 AM) [facility]ing to p[facility]u at [facility]  levothyroxine (SYNTHROID) 100 MCG tablet [id number] Yes Take 1 tablet (100 mcg total) by  mouth daily before breakfast. Shamleffer, Konrad Dolores, MD Taking Active   MAGNESIUM-ZINC PO [id number] Yes Take 1 tablet by mouth daily. [provider] Taking Active Self  meclizine (ANTIVERT) 12.5 MG tablet [id number] No Take 1 tablet (12.5 mg total) by mouth 3 (three) times daily as needed for dizziness.  Patient not taking: Reported on 10/13/2023   Kara Dies, NP Not Taking Active   methylPREDNISolone (MEDROL DOSEPAK) 4 MG TBPK tablet [id number] Yes Take 1 tablet (4 mg) with supper 10/21, take 2 tablets (8 mg) at bedtime 10/21. Take 1 tablet (4 mg) FOUR times daily with meals and at bedtime on 10/22 and 10/23 Take 1 table (4 mg) TWICE daily on 10/24 Pennie Banter, DO Taking Active            Med Note Sharon Seller, Wilba Mutz L   Tue Oct 13, 2023 11:26 AM) [facility] to p[facility] at [facility]  Multiple Vitamins-Minerals (ABC PLUS SENIOR ADULTS 50+ PO) [id number] Yes Take 1 tablet by mouth daily. [provider] Taking Active Self           Med Note Lauretta Chester, ALEXANDRE A   Tue Feb 26, 2021  1:15 PM) Centrum Silver   omeprazole (PRILOSEC OTC) 20 MG tablet [id number] Yes Take 20 mg by mouth daily. [provider] Taking Active Self  potassium chloride SA (KLOR-CON M) 20 MEQ tablet [id number] Yes Take 1 tablet (20 mEq total) by mouth daily as needed. Take along with lasix. Pennie Banter, DO Taking Active   pregabalin (LYRICA) 100 MG capsule [id number] Yes Take 1 capsule (100 mg total) by mouth 2 (two) times daily as needed for up to 15 days (for increased back or thigh pain). Continue usual dose 225 mg at bedtime Esaw Grandchild A, DO Taking Active   pregabalin (LYRICA) 225 MG capsule [id number] Yes Take 1 capsule (225 mg total) by mouth at bedtime. Shamleffer, Konrad Dolores, MD Taking Active   rosuvastatin (CRESTOR) 10 MG tablet [id number] Yes Take 1 tablet (10 mg total) by mouth daily. Bethanie Dicker, NP Taking Active             Home Care and Equipment/Supplies: Were Home  Health Services Ordered?: No Any new equipment or medical supplies ordered?: No  Functional Questionnaire: Do you need assistance with bathing/showering or dressing?: No Do you need assistance with meal preparation?: No Do you need assistance with eating?: No Do you have difficulty maintaining continence: No Do you need assistance with getting out of bed/getting out of a chair/moving?: No Do you have difficulty managing or taking your medications?: No  Follow up appointments reviewed: PCP Follow-up appointment confirmed?: Yes Date of PCP follow-up appointment?: 10/20/23 Follow-up Provider: Bethanie Dicker Specialist [facility] Follow-up appointment confirmed?: Yes Date of Specialist follow-up appointment?: 10/28/23 Follow-Up Specialty Provider:: Neurology 11/6, Endocrinology 11/19 Do you need transportation to your follow-up appointment?: No Do you understand care options if your condition(s) worsen?: Yes-patient verbalized understanding  Goals Addressed             This Visit's Progress    TOC Care Plan       Current Barriers:  Medication management Obtaining and starting new medications , clarifying doses  Provider appointments with PCP, Neurology and Endocrinology  RNCM Clinical Goal(s):  Patient will take all medications exactly as prescribed and will call provider for medication related questions as evidenced by no missed medications and taking medication as directed  demonstrate ongoing self health care management ability with medication management, provider appointments medication adjustments  as evidenced by no missed or adverse side effects , no uncontrolled symptoms   through collaboration with RN Care manager, provider, and care team.   Interventions: Evaluation of current treatment plan related to  self management and patient's adherence to plan as established by provider  Transitions of Care:  New goal. Doctor Visits  - discussed the importance of doctor  visits  Patient Goals/Self-Care Activities: Participate in Transition of Care Program/Attend [facility] scheduled calls Take all medications as prescribed Call pharmacy for medication refills 3-7 days in advance of running out of medications Perform all self care activities independently  Perform IADL's (shopping, preparing meals, housekeeping, managing finances) independently Call provider office for new concerns or questions   Follow Up Plan:  The patient has been provided with contact information for the care management team and has been advised to call with any health related questions or concerns.         Routine follow-up and on-going assessment evaluation and education of disease processes, recommended interventions for both chronic and acute medical conditions , will occur during each  visit along with ongoing  management of symptoms ,medication reviews and reconciliation. Will make changes and updates episodically to Care Plan and initiate visits / follow-up as indicated .   Patient provided with Contact information for [facility] CM   Reviewed goals for care , patient   verbalized understanding and agreement with current POC.  Next scheduled visit 10/20/23 1:00pm   Susa Loffler , BSN, RN Care Management Coordinator [place]   [facility] [email or web address] Direct Dial: [phone]

## 2023-10-20 ENCOUNTER — Ambulatory Visit (INDEPENDENT_AMBULATORY_CARE_PROVIDER_SITE_OTHER): Payer: Medicare Other | Admitting: Nurse Practitioner

## 2023-10-20 ENCOUNTER — Encounter: Payer: Self-pay | Admitting: Nurse Practitioner

## 2023-10-20 ENCOUNTER — Other Ambulatory Visit: Payer: Self-pay

## 2023-10-20 ENCOUNTER — Telehealth: Payer: Self-pay

## 2023-10-20 VITALS — BP 124/62 | HR 85 | Temp 98.5°F | Ht 65.0 in | Wt 178.4 lb

## 2023-10-20 DIAGNOSIS — E103293 Type 1 diabetes mellitus with mild nonproliferative diabetic retinopathy without macular edema, bilateral: Secondary | ICD-10-CM | POA: Diagnosis not present

## 2023-10-20 DIAGNOSIS — M48061 Spinal stenosis, lumbar region without neurogenic claudication: Secondary | ICD-10-CM | POA: Diagnosis not present

## 2023-10-20 DIAGNOSIS — M5416 Radiculopathy, lumbar region: Secondary | ICD-10-CM

## 2023-10-20 DIAGNOSIS — R011 Cardiac murmur, unspecified: Secondary | ICD-10-CM

## 2023-10-20 DIAGNOSIS — E871 Hypo-osmolality and hyponatremia: Secondary | ICD-10-CM | POA: Diagnosis not present

## 2023-10-20 LAB — BASIC METABOLIC PANEL
BUN: 13 mg/dL (ref 6–23)
CO2: 30 meq/L (ref 19–32)
Calcium: 8.7 mg/dL (ref 8.4–10.5)
Chloride: 96 meq/L (ref 96–112)
Creatinine, Ser: 0.63 mg/dL (ref 0.40–1.20)
GFR: 89.5 mL/min (ref >60.00)
Glucose, Bld: 113 mg/dL — ABNORMAL HIGH (ref 70–99)
Potassium: 3.9 meq/L (ref 3.5–5.1)
Sodium: 132 meq/L — ABNORMAL LOW (ref 135–145)

## 2023-10-20 LAB — MICROALBUMIN / CREATININE URINE RATIO
Creatinine,U: 18.3 mg/dL
Microalb Creat Ratio: 3.8 mg/g (ref 0.0–30.0)
Microalb, Ur: <0.7 mg/dL (ref 0.0–1.9)

## 2023-10-20 NOTE — Assessment & Plan Note (Signed)
With radiculopathy. Follow up with Neurosurgery as scheduled.

## 2023-10-20 NOTE — Assessment & Plan Note (Signed)
Will check BMP today. She is no longer taking her Lasix, reports only taking in the past as needed. Discussed staying hydrated and increasing electrolytes. Advised to decrease alcohol intake. Will continue to monitor.

## 2023-10-20 NOTE — Progress Notes (Addendum)
Bethanie Dicker, NP-C Phone: [phone]  Karla Patton is a 72 y.o. female who presents today for hospital follow up.   Patient admitted to hospital on 10/09/2023 after being evaluated in the ED for right lower abdominal/groin pain that was radiating into the knee. She was also noted to have nausea and vomiting. Her sodium was found to be 123. She was admitted for management and further evaluation of hyponatremia and lumbar radiculopathy. She had a CT Abd/Pelvis as well as a MRI Lumbar Spine. Notes, labs and imaging all reviewed. She is scheduled for follow up with Neurosurgery on 10/28/2023. She continues to have right sided groin and thigh pain.   Social History   Tobacco Use  Smoking Status Never  Smokeless Tobacco Never    Current Outpatient Medications on File Prior to Visit  Medication Sig Dispense Refill   acetaminophen (TYLENOL) 650 MG CR tablet Take 650 mg by mouth every 8 (eight) hours as needed for pain.     APPLE CIDER VINEGAR PO Take 600 mg by mouth daily.  (Patient not taking: Reported on 10/13/2023)     aspirin EC 81 MG tablet Take 81 mg by mouth daily. Takes at bedtime     beta carotene 96295 UNIT capsule Take 25,000 Units by mouth daily. (Patient not taking: Reported on 10/13/2023)     Cholecalciferol (VITAMIN D3) 250 MCG (10000 UT) capsule Take 10,000 Units by mouth daily.     Ferrous Sulfate Dried (HIGH POTENCY IRON) 65 MG TABS Take 1 tablet by mouth daily.     fluticasone (FLONASE) 50 MCG/ACT nasal spray SPRAY 2 SPRAYS INTO EACH NOSTRIL EVERY DAY 48 mL 1   furosemide (LASIX) 20 MG tablet Take 1 tablet (20 mg) by mouth DAILY AS NEEDED for weight gain or leg swelling 30 tablet 0   insulin aspart (NOVOLOG FLEXPEN) 100 UNIT/ML FlexPen Inject 12 units PLUS 0-20 units by sliding scale THREE TIMES DAILY WITH MEALS  SLIDING SCALE WHILE ON STEROIDS:  For CBG 70-120: 0 units For CBG 121-150: 3 units For CBG 151-200: 4 units For CBG 201-250: 7 units For CBG 251-300: 11  units For CBG 301-350: 15 units For CBG 351-400: 20 units For CBG > 400: 25 units and call your doctor     insulin glargine (LANTUS SOLOSTAR) 100 UNIT/ML Solostar Pen Inject 12 Units into the skin daily. 15 mL 4   irbesartan (AVAPRO) 75 MG tablet Take 1 tablet (75 mg total) by mouth daily. 30 tablet 2   levothyroxine (SYNTHROID) 100 MCG tablet Take 1 tablet (100 mcg total) by mouth daily before breakfast. 90 tablet 3   MAGNESIUM-ZINC PO Take 1 tablet by mouth daily.     meclizine (ANTIVERT) 12.5 MG tablet Take 1 tablet (12.5 mg total) by mouth 3 (three) times daily as needed for dizziness. (Patient not taking: Reported on 10/13/2023) 30 tablet 0   methylPREDNISolone (MEDROL DOSEPAK) 4 MG TBPK tablet Take 1 tablet (4 mg) with supper 10/21, take 2 tablets (8 mg) at bedtime 10/21. Take 1 tablet (4 mg) FOUR times daily with meals and at bedtime on 10/22 and 10/23 Take 1 table (4 mg) TWICE daily on 10/24 13 tablet 0   Multiple Vitamins-Minerals (ABC PLUS SENIOR ADULTS 50+ PO) Take 1 tablet by mouth daily.     omeprazole (PRILOSEC OTC) 20 MG tablet Take 20 mg by mouth daily.     potassium chloride SA (KLOR-CON M) 20 MEQ tablet Take 1 tablet (20 mEq total) by mouth daily as  needed. Take along with lasix.     pregabalin (LYRICA) 100 MG capsule Take 1 capsule (100 mg total) by mouth 2 (two) times daily as needed for up to 15 days (for increased back or thigh pain). Continue usual dose 225 mg at bedtime 30 capsule 0   pregabalin (LYRICA) 225 MG capsule Take 1 capsule (225 mg total) by mouth at bedtime. 90 capsule 1   rosuvastatin (CRESTOR) 10 MG tablet Take 1 tablet (10 mg total) by mouth daily. 90 tablet 1   No current facility-administered medications on file prior to visit.     ROS see history of present illness  Objective  Physical Exam Vitals:   10/20/23 1056  BP: 124/62  Pulse: 85  Temp: 98.5 F (36.9 C)  SpO2: 97%    BP Readings from Last 3 Encounters:  10/20/23 124/62  10/12/23 (!)  145/67  10/09/23 (!) 183/82   Wt Readings from Last 3 Encounters:  10/20/23 178 lb 6.4 oz (80.9 kg)  10/12/23 165 lb 5.5 oz (75 kg)  10/09/23 175 lb (79.4 kg)    Physical Exam Constitutional:      General: She is not in acute distress.    Appearance: Normal appearance.  HENT:     Head: Normocephalic.  Cardiovascular:     Rate and Rhythm: Normal rate and regular rhythm.     Heart sounds: Murmur (chronic) heard.  Pulmonary:     Effort: Pulmonary effort is normal.     Breath sounds: Normal breath sounds.  Skin:    General: Skin is warm and dry.  Neurological:     General: No focal deficit present.     Mental Status: She is alert.  Psychiatric:        Mood and Affect: Mood normal.        Behavior: Behavior normal.    Assessment/Plan: Please see individual problem list.  Hyponatremia Assessment & Plan: Will check BMP today. She is no longer taking her Lasix, reports only taking in the past as needed. Discussed staying hydrated and increasing electrolytes. Advised to decrease alcohol intake. Will continue to monitor.   Orders: -     Basic metabolic panel  Acute lumbar radiculopathy Assessment & Plan: Continues to have pain in right groin/pelvis into thigh. Had MRI during hospital stay. She will follow up with Neurosurgery on 10/28/2023 as scheduled.    Spinal stenosis of lumbar region, unspecified whether neurogenic claudication present Assessment & Plan: With radiculopathy. Follow up with Neurosurgery as scheduled.    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 Lantus recently increased to 14 units. Novolog per sliding scale. Having fluctuations due to recent steroid use. Denies polyuria or polydipsia. She will follow up with Endocrinology next month as scheduled.   Orders: -     Microalbumin / creatinine urine ratio  Murmur Assessment & Plan: Chronic. Echo in 2023 noted  mitral regurgitation. Recommended repeat in 3-5 years. She has not been evaluated by Cardiology. Will monitor. Denies chest pain. Denies shortness of breath.      Return in about 16 weeks (around 02/09/2024) for Follow up as scheduled.   Bethanie Dicker, NP-C [facility] [employer]

## 2023-10-20 NOTE — Assessment & Plan Note (Addendum)
Chronic. Echo in 2023 noted mitral regurgitation. Recommended repeat in 3-5 years. She has not been evaluated by Cardiology. Will monitor. Denies chest pain. Denies shortness of breath.

## 2023-10-20 NOTE — Assessment & Plan Note (Addendum)
Continues to have pain in right groin/pelvis into thigh. Had MRI during hospital stay. She will follow up with Neurosurgery on 10/28/2023 as scheduled.

## 2023-10-20 NOTE — Assessment & Plan Note (Signed)
Chronic. Lantus recently increased to 14 units. Novolog per sliding scale. Having fluctuations due to recent steroid use. Denies polyuria or polydipsia. She will follow up with Endocrinology next month as scheduled.

## 2023-10-20 NOTE — Patient Outreach (Signed)
Care Management  Transitions of Care Program Transitions of Care Post-discharge week 2   10/20/2023 Name: Karla Patton MRN: [id number] DOB: June 07, 1951  Subjective: Karla Patton is a 72 y.o. year old female who is a primary care patient of Bethanie Dicker, NP. The Care Management team Engaged with patient Engaged with patient by telephone to assess and address transitions of care needs.   Consent to Services:  Patient was given information about care management services, agreed to services, and gave verbal consent to participate.   Assessment:   Patient voices no new complaints Patient has not developed/ reported any new Medical issues / Dx or acute changes.- since last follow-up call for most recent  Hospital stay    10/18-10/21 / 2024  She was seen by PCP today, Lab work competed, She is following usual routine, She reports no medication changes,She is trying to incorporate Na in her diet although her spouse needs to decrease salt so she finds it difficult when completing meal preps  Patient educated on red flags s/s to watch for and was encouraged to report any of these identified , any new symptoms , changes in baseline or  medication regimen,  change in health status  /  well-being, or safety concerns to PCP and / or the  [facility] Case Management team .          SDOH Interventions    Flowsheet Row Telephone from 10/20/2023 in  POPULATION HEALTH DEPARTMENT Clinical Support from 02/11/2023 in [facility] Chronic Care Management from 11/29/2021 in [facility] Chronic Care Management from 08/30/2021 in [facility] Chronic Care Management from 05/31/2021 in [facility] Chronic Care Management from 02/26/2021 in [facility]  SDOH Interventions        Food Insecurity Interventions -- Intervention Not Indicated -- -- -- --  Housing  Interventions -- Intervention Not Indicated -- -- -- --  Transportation Interventions -- Intervention Not Indicated -- -- -- --  Utilities Interventions -- Intervention Not Indicated -- -- -- --  Alcohol Usage Interventions -- Intervention Not Indicated (Score <7) -- -- -- --  Financial Strain Interventions -- Other (Comment)  [Community referral placed] Intervention Not Indicated Intervention Not Indicated Intervention Not Indicated Other (Comment)  [PAP]  Physical Activity Interventions -- Intervention Not Indicated -- -- -- --  Stress Interventions Patient Declined  [States she has 42 yo Gr daughter living with her  .. enough said] Intervention Not Indicated -- -- -- --  Social Connections Interventions -- Intervention Not Indicated -- -- -- --        Goals Addressed             This Visit's Progress    TOC Care Plan       Current Barriers:  Medication management Obtaining and starting new medications , clarifying doses  Provider appointments with PCP, Neurology and Endocrinology  RNCM Clinical Goal(s):  Patient will take all medications exactly as prescribed and will call provider for medication related questions as evidenced by no missed medications and taking medication as directed  demonstrate ongoing self health care management ability with medication management, provider appointments medication adjustments  as evidenced by no missed or adverse side effects , no uncontrolled symptoms   through collaboration with RN Care manager, provider, and care team.   Interventions: Evaluation of current treatment plan related to  self management and patient's adherence to  plan as established by provider  Transitions of Care:  Goal on track:  Yes. Labs follow-up labs drawn, waiting on results  Doctor Visits  - discussed the importance of doctor visits  Patient Goals/Self-Care Activities: Participate in Transition of Care Program/Attend [facility] scheduled calls Take all medications as  prescribed Call pharmacy for medication refills 3-7 days in advance of running out of medications Perform all self care activities independently  Perform IADL's (shopping, preparing meals, housekeeping, managing finances) independently Call provider office for new concerns or questions   Follow Up Plan:  Telephone follow up appointment with care management team member scheduled for:  10/27/23 2:00pm The patient has been provided with contact information for the care management team and has been advised to call with any health related questions or concerns.          Plan: The patient has been provided with contact information for the care management team and has been advised to call with any health related questions or concerns.  Routine follow-up and on-going assessment evaluation and education of disease processes, recommended interventions for both chronic and acute medical conditions , will occur during each weekly visit along with ongoing review of symptoms ,medication reviews and reconciliation. Any updates , inconsistencies, discrepancies or acute care concerns will be addressed and routed to the correct Practitioner if indicated   Please refer to Care Plan for goals and interventions -Effectiveness of interventions, symptom management and outcomes will be evaluated  weekly during [facility] 30-day Program Outreach calls  . Any necessary  changes and updates to Care Plan will be completed episodically    Reviewed goals for care  Patient provided with Contact information and verbalized understanding with current POC.   Susa Loffler , BSN, RN Care Management Coordinator [place]   [facility] [email or web address] Direct Dial: [phone]

## 2023-10-26 NOTE — Progress Notes (Unsigned)
Referring Physician:  Pennie Banter, DO [address] [place],  [place] [address]  Primary Physician:  Bethanie Dicker, NP  History of Present Illness: 10/28/2023 Karla Patton is here today with a chief complaint of right hip and leg pain.  She has a history of lumbar spondylosis and had a previous lumbar fusion 2 years ago which was L4-5 TLIF.  She states that this gave her significant improvement in her back pain.  To the hospital for acute onset of right lower extremity pain mostly in her hip and upper buttocks.  She states that this came on all of a sudden she recently present.  Is not associated with any numbness.  She does feel that its pain is there almost at all times but is worsened with activity.  Certain positions lying down can make it worse.  She does not have anything going past her knee.  Not having any weakness numbness no bowel or bladder dysfunction.  She has tried a Medrol Dosepak.  No steroid injections.  Past Surgery: 03/18/2021  L4-5 transforaminal lumbar interbody fusion,   The symptoms are causing a significant impact on the patient's life.   I have utilized the care everywhere function in epic to review the outside records available from external health systems.  Review of Systems:  A 10 point review of systems is negative, except for the pertinent positives and negatives detailed in the HPI.  Past Medical History: Past Medical History:  Diagnosis Date   Allergy 11/2021   Lisinolpril-cough   Anemia    Anxiety    Arthritis    Back pain    Diabetes mellitus without complication (HCC)    type 1   Emphysema of lung (HCC)    GERD (gastroesophageal reflux disease)    Heart murmur    Hypertension associated with diabetes (HCC) 03/03/2018   Hypothyroidism    Thyroid disease    Varicose vein     Past Surgical History: Past Surgical History:  Procedure Laterality Date   ABDOMINAL HYSTERECTOMY  1994   CHOLECYSTECTOMY  09/07/2013   COLONOSCOPY  2009    normal   COLONOSCOPY WITH PROPOFOL N/A 10/20/2018   Procedure: COLONOSCOPY WITH PROPOFOL;  Surgeon: Earline Mayotte, MD;  Location: [facility] ENDOSCOPY;  Service: Endoscopy;  Laterality: N/A;   INCONTINENCE SURGERY  2003   left leg vein ligation  2015   SPINE SURGERY  03-18-2021   VARICOSE VEIN SURGERY  1990's   Dr Lemar Livings   VEIN SURGERY Left 11/17/2017   vein closure procedure/Dr Evette Cristal    Allergies: Allergies as of 10/28/2023 - Review Complete 10/28/2023  Allergen Reaction Noted   Lisinopril Cough 02/25/2022    Medications:  Current Outpatient Medications:    acetaminophen (TYLENOL) 650 MG CR tablet, Take 650 mg by mouth every 8 (eight) hours as needed for pain., Disp: , Rfl:    aspirin EC 81 MG tablet, Take 81 mg by mouth daily. Takes at bedtime, Disp: , Rfl:    fluticasone (FLONASE) 50 MCG/ACT nasal spray, SPRAY 2 SPRAYS INTO EACH NOSTRIL EVERY DAY, Disp: 48 mL, Rfl: 1   insulin aspart (NOVOLOG FLEXPEN) 100 UNIT/ML FlexPen, Inject 12 units PLUS 0-20 units by sliding scale THREE TIMES DAILY WITH MEALS  SLIDING SCALE WHILE ON STEROIDS:  For CBG 70-120: 0 units For CBG 121-150: 3 units For CBG 151-200: 4 units For CBG 201-250: 7 units For CBG 251-300: 11 units For CBG 301-350: 15 units For CBG 351-400: 20 units  For CBG > 400: 25 units and call your doctor, Disp: , Rfl:    insulin glargine (LANTUS SOLOSTAR) 100 UNIT/ML Solostar Pen, Inject 12 Units into the skin daily., Disp: 15 mL, Rfl: 4   irbesartan (AVAPRO) 75 MG tablet, Take 1 tablet (75 mg total) by mouth daily., Disp: 30 tablet, Rfl: 2   levothyroxine (SYNTHROID) 100 MCG tablet, Take 1 tablet (100 mcg total) by mouth daily before breakfast., Disp: 90 tablet, Rfl: 3   omeprazole (PRILOSEC OTC) 20 MG tablet, Take 20 mg by mouth daily., Disp: , Rfl:    potassium chloride SA (KLOR-CON M) 20 MEQ tablet, Take 1 tablet (20 mEq total) by mouth daily as needed. Take along with lasix., Disp: , Rfl:    pregabalin (LYRICA) 100 MG capsule, Take  1 capsule (100 mg total) by mouth 2 (two) times daily as needed for up to 15 days (for increased back or thigh pain). Continue usual dose 225 mg at bedtime, Disp: 30 capsule, Rfl: 0   pregabalin (LYRICA) 225 MG capsule, Take 1 capsule (225 mg total) by mouth at bedtime., Disp: 90 capsule, Rfl: 1   rosuvastatin (CRESTOR) 10 MG tablet, Take 1 tablet (10 mg total) by mouth daily., Disp: 90 tablet, Rfl: 1   Cholecalciferol (VITAMIN D3) 250 MCG (10000 UT) capsule, Take 10,000 Units by mouth daily. (Patient not taking: Reported on 10/28/2023), Disp: , Rfl:    Ferrous Sulfate Dried (HIGH POTENCY IRON) 65 MG TABS, Take 1 tablet by mouth daily. (Patient not taking: Reported on 10/28/2023), Disp: , Rfl:    furosemide (LASIX) 20 MG tablet, Take 1 tablet (20 mg) by mouth DAILY AS NEEDED for weight gain or leg swelling (Patient not taking: Reported on 10/28/2023), Disp: 30 tablet, Rfl: 0   Multiple Vitamins-Minerals (ABC PLUS SENIOR ADULTS 50+ PO), Take 1 tablet by mouth daily. (Patient not taking: Reported on 10/28/2023), Disp: , Rfl:   Social History: Social History   Tobacco Use   Smoking status: Never   Smokeless tobacco: Never  Vaping Use   Vaping status: Never Used  Substance Use Topics   Alcohol use: Yes    Alcohol/week: 7.0 standard drinks of alcohol    Types: 7 Glasses of wine per week   Drug use: No    Family Medical History: Family History  Problem Relation Age of Onset   Diabetes Mother    Pancreatitis Mother    Heart disease Father    Stroke Father        x 2   Hypertension Father    Varicose Veins Father    Fibromyalgia Sister    Scoliosis Sister    Arthritis Sister    Diabetes Sister    Breast cancer Neg Hx     Physical Examination: Vitals:   10/28/23 0912  BP: (!) 140/84    General: Patient is in no apparent distress. Attention to examination is appropriate.  Neck:   Supple.  Full range of motion.  Respiratory: Patient is breathing without any  difficulty.   NEUROLOGICAL:     Awake, alert, oriented to person, place, and time.  Speech is clear and fluent.   Cranial Nerves: Pupils equal round and reactive to light.  Facial tone is symmetric.  Facial sensation is symmetric. Shoulder shrug is symmetric. Tongue protrusion is midline.    Strength:  Side Iliopsoas Quads Hamstring PF DF EHL  R 5 5 5 5 5 5   L 5 5 5 5 5 5    Reflexes are  trace atpatella and achilles.   Hoffman's is absent. Clonus is absent  Bilateral upper and lower extremity sensation is intact to light touch.  Notably she does states she gets nighttime numbness and tingling in her toes and feet     No evidence of dysmetria noted.  Antalgic  Imaging: Narrative & Impression  CLINICAL DATA:  Lumbar radiculopathy, no red flags, no prior management   EXAM: MRI LUMBAR SPINE WITHOUT CONTRAST   TECHNIQUE: Multiplanar, multisequence MR imaging of the lumbar spine was performed. No intravenous contrast was administered.   COMPARISON:  MRI 09/17/2020   FINDINGS: Segmentation:  Standard.   Alignment: Fixed grade 1 anterolisthesis of L4 on L5. There is also mild grade 1 anterolisthesis of L5 on S1.   Vertebrae: Posterior and interbody fusion at L4-5. No acute fracture. No evidence of discitis peer mild discogenic endplate marrow changes at L3-4. No suspicious bone lesion.   Conus medullaris and cauda equina: Conus extends to the L1-2 level. Conus and cauda equina appear normal.   Paraspinal and other soft tissues: Negative.   Disc levels:   T12-L1: Unremarkable.   L1-L2: Mild broad-based disc bulge.  No foraminal or canal stenosis.   L2-L3: Annular disc bulge with mild bilateral facet arthropathy and ligamentum flavum buckling. Mild-moderate canal stenosis with moderate left and mild right foraminal stenosis. Findings have progressed from prior.   L3-L4: Annular disc bulge with bilateral facet arthropathy and ligamentum flavum buckling. Mild canal  stenosis with moderate-severe right and mild left foraminal stenosis. Findings have progressed from prior.   L4-L5: Interval fusion and decompression. Canal and bilateral foramina are widely patent. Appearance has improved compared to prior.   L5-S1: Mild annular disc bulge and mild bilateral facet arthropathy. No canal stenosis. Borderline-mild bilateral foraminal stenosis, similar to prior.   IMPRESSION: 1. Interval fusion and decompression at L4-5 with widely patent canal and bilateral foramina at this level. 2. Progressive adjacent segment degenerative changes at L2-3 and L3-4 with mild-moderate canal stenosis at L2-3 and mild canal stenosis at L3-4. 3. Moderate-severe right foraminal stenosis at L3-4. Moderate left foraminal stenosis at L2-3.     Electronically Signed   By: Duanne Guess D.O.   On: 10/10/2023 15:13    I have personally reviewed the images and agree with the above interpretation.  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 Berringer is a pleasant 72 y.o. female with severe acute onset of right lower extremity pain.  She points mostly to her inguinal region and her upper buttocks region for where the pain is.  She states that this sometimes radiates to her thigh but never goes past her knee.  She has not had any sensation changes.  No weakness.  She states that the pain is there at all times but worsens with certain activities and laying on it.  On physical examination she has negative straight leg raise.  She has a positive FABIR sign and positive pain to palpation of the greater trochanter.  Her MRI does not fact show some adjacent level stenosis on the right which would  potentially cause her to have an L3 radiculopathy, however she states that her pain does not significantly past her knee.  I do have a concern for possible hip etiology given her  physical examination, will make referral to orthopedic surgery for evaluation of her acute onset of hip pain.  In regards to her back history, could this be a L3 radiculopathy, she does not have any localizing signs on her physical examination and has a negative straight leg raise, however she does have significant severe foraminal stenosis noted especially at the L3-4 interspace on the right.  This could be causing some of her symptoms, if this was to be a acute onset of a radiculopathy we will make referral for physical therapy and later injections should she have any progressive symptoms.  I will reach out to her primary care provider and let them know that we are sending her to orthopedic surgery for hip evaluation.  Will plan to follow-up with her in approximately 3 months.  Thank you for involving me in the care of this patient.    Lovenia Kim MD/MSCR Neurosurgery

## 2023-10-27 ENCOUNTER — Telehealth: Payer: Self-pay

## 2023-10-27 ENCOUNTER — Other Ambulatory Visit: Payer: Self-pay

## 2023-10-27 NOTE — Patient Outreach (Signed)
  Care Management  Transitions of Care Program Transitions of Care Post-discharge week 2  10/27/2023 Name: Karla Patton MRN: [id number] DOB: 06/11/1951  Subjective: Karla Patton is a 72 y.o. year old female who is a primary care patient of Bethanie Dicker, NP. The Care Management team spoke with patient by telephone to assess and address transitions of care needs.  She was at [employer] with her dog and requested a call at a different time   Plan: Additional outreach attempts will be made to reach the patient enrolled in the [facility] Program (Post Inpatient/ED Visit).  Susa Loffler , BSN, RN Care Management Coordinator [place]   [facility] [email or web address] Direct Dial: [phone]

## 2023-10-28 ENCOUNTER — Ambulatory Visit (INDEPENDENT_AMBULATORY_CARE_PROVIDER_SITE_OTHER): Payer: Medicare Other | Admitting: Neurosurgery

## 2023-10-28 ENCOUNTER — Telehealth: Payer: Self-pay

## 2023-10-28 ENCOUNTER — Ambulatory Visit
Admission: RE | Admit: 2023-10-28 | Discharge: 2023-10-28 | Disposition: A | Discharge status: Home / Self Care | Payer: Medicare Other | Attending: Neurosurgery | Admitting: Neurosurgery

## 2023-10-28 ENCOUNTER — Ambulatory Visit
Admission: RE | Admit: 2023-10-28 | Discharge: 2023-10-28 | Disposition: A | Discharge status: Home / Self Care | Payer: Medicare Other | Source: Ambulatory Visit | Attending: Neurosurgery | Admitting: Neurosurgery

## 2023-10-28 ENCOUNTER — Encounter: Payer: Self-pay | Admitting: Neurosurgery

## 2023-10-28 VITALS — BP 140/84 | Ht 65.0 in | Wt 174.0 lb

## 2023-10-28 DIAGNOSIS — M5441 Lumbago with sciatica, right side: Secondary | ICD-10-CM

## 2023-10-28 DIAGNOSIS — G8929 Other chronic pain: Secondary | ICD-10-CM | POA: Diagnosis not present

## 2023-10-28 DIAGNOSIS — M5416 Radiculopathy, lumbar region: Secondary | ICD-10-CM

## 2023-10-28 DIAGNOSIS — M47816 Spondylosis without myelopathy or radiculopathy, lumbar region: Secondary | ICD-10-CM | POA: Diagnosis not present

## 2023-10-28 DIAGNOSIS — M48061 Spinal stenosis, lumbar region without neurogenic claudication: Secondary | ICD-10-CM

## 2023-10-28 DIAGNOSIS — M549 Dorsalgia, unspecified: Secondary | ICD-10-CM | POA: Diagnosis not present

## 2023-10-28 DIAGNOSIS — M25551 Pain in right hip: Secondary | ICD-10-CM | POA: Diagnosis not present

## 2023-10-28 DIAGNOSIS — Z981 Arthrodesis status: Secondary | ICD-10-CM

## 2023-10-28 DIAGNOSIS — M51369 Other intervertebral disc degeneration, lumbar region without mention of lumbar back pain or lower extremity pain: Secondary | ICD-10-CM | POA: Insufficient documentation

## 2023-10-28 DIAGNOSIS — M4316 Spondylolisthesis, lumbar region: Secondary | ICD-10-CM | POA: Diagnosis not present

## 2023-10-28 DIAGNOSIS — M438X6 Other specified deforming dorsopathies, lumbar region: Secondary | ICD-10-CM | POA: Diagnosis not present

## 2023-10-28 NOTE — Patient Instructions (Signed)
LOCAL PHYSICAL THERAPY  [facility]  [address]  [place], [place] [address]  [phone]  [facility]  [address] [place], [place] [address]  [phone]  [facility] (2 locations)  [address]  [place], [place] [address]  [phone]          or  [address]  [place], [place] [address]  [phone]  [facility]  [address]  [place], [place] [address]  [phone]  **dry needling**  [facility]  [address]  [place], [place] [address]  [phone]  Fax: [phone]  ** Aquatic therapy[address] [place], [place] [address] [phone] **Aquatic therapy**  [facility]  [facility]  [address]  [place], [place] [address]  [phone]  [facility]  [address]  [place], [place] [address]  [phone]  [facility]  [address]   Janora Norlander  [place], [place] [address]  [phone]  [facility]  [address]  [place], [place] [address]  [phone]  **dry needling**   PELVIC FLOOR/SI JOINT  [facility]  Mariane Masters, PT  [email or web address]   [place]  [facility]  [address]  [address] [place], [place] [address]  [phone]   [facility] [place]  [address], [place] [address]  [phone]   [facility], [place]  [facility]  Raymond Gurney, PT  [address]  [place], [place] [address] [phone]   Samara Deist  [facility]  [address]  [phone]   [facility]  [address]  [phone]   [facility]  Annamaria Boots [facility]  [address]  [place], [place] [address]  [phone]   **No Aetna or medicaid**  [facility]  [phone] [address], [place] [address]  [phone]  [facility]  [address]  [place], [place] [address]  [phone]  Dannial Monarch, DC  [address], [place] [address]  [phone]  [facility]  [address], [place] [address]  [phone]  [facility]  [address]  [place], [place] [address]  [phone]  [facility]  (3 locations)  [address]  [place], [place] [address]  [phone]  **dry needling**           or  [address] [place], [place] [address]  [phone]  **Additionally has Gloris Manchester, OT**           or  [address]  [place], [place] [address]  [phone]  **Pediatric therapy**  [facility]  [address]  [phone]  **dry needlingVerdie Drown [facility]  [address]  [place], [place] [address]  [phone]  [facility]   (Inside [address] Fitness)  [address]  [place], [place] [address]  [phone]  **dry needling**  **MEDICAID or UNINSURED** [facility] dept. Of Physical Therapy [place], [place] [address] [phone]  [facility]  [address] [place], [place] [address]  [phone]   [facility]  [address]  [place], [place] [address]  [phone]   Doreatha Martin  [facility]  [address] [place], [place] [address]  [phone]   [facility]  [address]  [place], [place] [address]  [phone]   [facility]  [address] [place], [place] [address]  [phone]  [facility]  [address]  [place], [place] [address]  [phone]  AQUATIC  Kathalene Frames [facility]  [employer]  [facility]  [place]  [place]  *Residents only*   [facility] [employer]  *[facility]  Exercise class  [facility]  Exercise class  [facility]  [address]  [place], [place]   [phone]  [facility]  [address]  [place], [place] [address]  [phone]   [place], [place]  [facility]  [address]  [phone]   [facility]  [facility]  [address]  [phone]           or  [address]  [phone]   [facility] Arthritis Support Group   Provides education and support and practical information for coping with arthritis for arthritis sufferers and their families.   When: 12:15 - 1:30 p.m. the second Monday of each month, March through December  Info: Call Rehabilitation Services at [phone]

## 2023-10-28 NOTE — Patient Outreach (Signed)
Care Management  Transitions of Care Program Transitions of Care Post-discharge week 3   10/28/2023 Name: Karla Patton MRN: [id number] DOB: Oct 13, 1951  Subjective: Karla Patton is a 72 y.o. year old female who is a primary care patient of Bethanie Dicker, NP. The Care Management team Engaged with patient Engaged with patient by telephone to assess and address transitions of care needs.   Consent to Services:  Patient was given information about care management services, agreed to services, and gave verbal consent to participate.   Assessment:   Patient voices no new complaints Patient has not developed/ reported any new Medical issues / Dx or acute changes.- since last follow-up call for most recent  Hospital stay   10/18-10/21/ 2024  She is doing very well Following usual routine Seen by Neurology today Has x-ray right hip/ back. Per patient results may not be available x 2 weeks . She may see an Orthopedic based on results Her Neurologist will let her know. She has a referral to Out-patient PT. She has a list but son is a PTA with [employer] and she is going to f/u with him on possible treatment providers . No med changes  Patient educated on red flags s/s to watch for and was encouraged to report any of these identified , any new symptoms , changes in baseline or  medication regimen,  change in health status  /  well-being, or safety concerns to PCP and / or the  [facility] Case Management team .          SDOH Interventions    Flowsheet Row Telephone from 10/28/2023 in [place] POPULATION HEALTH DEPARTMENT Telephone from 10/20/2023 in [place] POPULATION HEALTH DEPARTMENT Clinical Support from 02/11/2023 in [facility] Chronic Care Management from 11/29/2021 in [facility] Practice Chronic Care Management from 08/30/2021 in [facility] Chronic Care Management from 05/31/2021 in [facility] Family  Practice  SDOH Interventions        Food Insecurity Interventions Intervention Not Indicated -- Intervention Not Indicated -- -- --  Housing Interventions Intervention Not Indicated -- Intervention Not Indicated -- -- --  Transportation Interventions Intervention Not Indicated, Patient Resources Dietitian) -- Intervention Not Indicated -- -- --  Utilities Interventions Intervention Not Indicated -- Intervention Not Indicated -- -- --  Alcohol Usage Interventions -- -- Intervention Not Indicated (Score <7) -- -- --  Financial Strain Interventions -- -- Other (Comment)  [Community referral placed] Intervention Not Indicated Intervention Not Indicated Intervention Not Indicated  Physical Activity Interventions -- -- Intervention Not Indicated -- -- --  Stress Interventions -- Patient Declined  [States she has 64 yo Gr daughter living with her  .. enough said] Intervention Not Indicated -- -- --  Social Connections Interventions -- -- Intervention Not Indicated -- -- --        Goals Addressed             This Visit's Progress    TOC Care Plan       Current Barriers:  Medication management Obtaining and starting new medications , clarifying doses  Provider appointments with PCP, Neurology and Endocrinology  RNCM Clinical Goal(s):  Patient will take all medications exactly as prescribed and will call provider for medication related questions as evidenced by no missed medications and taking medication as directed  demonstrate ongoing self health care management ability with medication management, provider appointments medication adjustments  as evidenced by no missed  or adverse side effects , no uncontrolled symptoms   through collaboration with RN Care manager, provider, and care team.   Interventions: Evaluation of current treatment plan related to  self management and patient's adherence to plan as established by provider  Transitions of Care:  Goal on track:  Yes. Labs follow-up  labs drawn, waiting on results - has xray today may  see Ortho  based on results  Doctor Visits  - discussed the importance of doctor visits  Patient Goals/Self-Care Activities: Participate in Transition of Care Program/Attend TOC scheduled calls Take all medications as prescribed Call pharmacy for medication refills 3-7 days in advance of running out of medications Perform all self care activities independently  Perform IADL's (shopping, preparing meals, housekeeping, managing finances) independently Call provider office for new concerns or questions   Follow Up Plan:  Telephone follow up appointment with care management team member scheduled for:  11/06/23 11:00 am The patient has been provided with contact information for the care management team and has been advised to call with any health related questions or concerns.          Plan: The patient has been provided with contact information for the care management team and has been advised to call with any health related questions or concerns.  Routine follow-up and on-going assessment evaluation and education of disease processes, recommended interventions for both chronic and acute medical conditions , will occur during each weekly visit along with ongoing review of symptoms ,medication reviews and reconciliation. Any updates , inconsistencies, discrepancies or acute care concerns will be addressed and routed to the correct Practitioner if indicated   Please refer to Care Plan for goals and interventions -Effectiveness of interventions, symptom management and outcomes will be evaluated  weekly during [facility] 30-day Program Outreach calls  . Any necessary  changes and updates to Care Plan will be completed episodically    Reviewed goals for care  Patient provided with Contact information and verbalized understanding with current POC.   Susa Loffler , BSN, RN Care Management Coordinator [place]   [facility] [email or web address] Direct Dial: [phone]

## 2023-11-06 ENCOUNTER — Telehealth: Payer: Self-pay

## 2023-11-06 ENCOUNTER — Other Ambulatory Visit: Payer: Self-pay

## 2023-11-06 NOTE — Patient Outreach (Signed)
  Care Management  Transitions of Care Program Transitions of Care Post-discharge week 4  11/06/2023 Name: Karla Patton MRN: [id number] DOB: [birth year]  Subjective: Karla Patton is a 72 y.o. year old female who is a primary care patient of Bethanie Dicker, NP. The Care Management team was unable to reach the patient by phone to assess and address transitions of care needs.   Plan: Additional outreach attempts will be made to reach the patient enrolled in the [facility] Program (Post Inpatient/ED Visit).  Susa Loffler , BSN, RN Care Management Coordinator [place]   [facility] [email or web address] Direct Dial: [phone]

## 2023-11-09 NOTE — Patient Outreach (Signed)
Care Management  Transitions of Care Program Transitions of Care Post-discharge week 3   11/09/2023 Name: Karla Patton MRN: [id number] DOB: [birth year]  Subjective: Karla Patton is a 72 y.o. year old female who is a primary care patient of Bethanie Dicker, NP. The Care Management team Engaged with patient Engaged with patient by telephone to assess and address transitions of care needs.   Consent to Services:  Patient was given information about care management services, agreed to services, and gave verbal consent to participate.   Assessment:           SDOH Interventions    Flowsheet Row Telephone from 10/28/2023 in [place] POPULATION HEALTH DEPARTMENT Telephone from 10/20/2023 in [place] POPULATION HEALTH DEPARTMENT Clinical Support from 02/11/2023 in [facility] Chronic Care Management from 11/29/2021 in [facility] Family Practice Chronic Care Management from 08/30/2021 in [facility] Family Practice Chronic Care Management from 05/31/2021 in [facility] Family Practice  SDOH Interventions        Food Insecurity Interventions Intervention Not Indicated -- Intervention Not Indicated -- -- --  Housing Interventions Intervention Not Indicated -- Intervention Not Indicated -- -- --  Transportation Interventions Intervention Not Indicated, Patient Resources Dietitian) -- Intervention Not Indicated -- -- --  Utilities Interventions Intervention Not Indicated -- Intervention Not Indicated -- -- --  Alcohol Usage Interventions -- -- Intervention Not Indicated (Score <7) -- -- --  Financial Strain Interventions -- -- Other (Comment)  [Community referral placed] Intervention Not Indicated Intervention Not Indicated Intervention Not Indicated  Physical Activity Interventions -- -- Intervention Not Indicated -- -- --  Stress Interventions -- Patient Declined  [States she has 44 yo Gr daughter living with her   .. enough said] Intervention Not Indicated -- -- --  Social Connections Interventions -- -- Intervention Not Indicated -- -- --        Goals Addressed             This Visit's Progress    TOC Care Plan       Current Barriers:  Medication management Obtaining and starting new medications , clarifying doses  Provider appointments with PCP, Neurology and Endocrinology  RNCM Clinical Goal(s):  Patient will take all medications exactly as prescribed and will call provider for medication related questions as evidenced by no missed medications and taking medication as directed  demonstrate ongoing self health care management ability with medication management, provider appointments medication adjustments  as evidenced by no missed or adverse side effects , no uncontrolled symptoms   through collaboration with RN Care manager, provider, and care team.   Interventions: Evaluation of current treatment plan related to  self management and patient's adherence to plan as established by provider  Transitions of Care:  Goal on track:  Yes. Begin Home Health Services with [phone] PT   Patient Goals/Self-Care Activities: Participate in Transition of Care Program/Attend [facility] scheduled calls Take all medications as prescribed Call pharmacy for medication refills 3-7 days in advance of running out of medications Perform all self care activities independently  Perform IADL's (shopping, preparing meals, housekeeping, managing finances) independently Call provider office for new concerns or questions   Follow Up Plan:  Telephone follow up appointment with care management team member scheduled for:  11/11/23 -2:00 pm The patient has been provided with contact information for the care management team and has been advised to call with any health related questions or concerns.  Plan: The patient has been provided with contact information for the care management team and has been advised to  call with any health related questions or concerns.  Routine follow-up and on-going assessment evaluation and education of disease processes, recommended interventions for both chronic and acute medical conditions , will occur during each weekly visit along with ongoing review of symptoms ,medication reviews and reconciliation. Any updates , inconsistencies, discrepancies or acute care concerns will be addressed and routed to the correct Practitioner if indicated   Please refer to Care Plan for goals and interventions -Effectiveness of interventions, symptom management and outcomes will be evaluated  weekly during [facility] 30-day Program Outreach calls  . Any necessary  changes and updates to Care Plan will be completed episodically    Reviewed goals for care  Patient provided with Contact information and verbalized understanding with current POC.   Susa Loffler , BSN, RN Care Management Coordinator [place]   [facility] [email or web address] Direct Dial: [phone]

## 2023-11-10 ENCOUNTER — Ambulatory Visit (INDEPENDENT_AMBULATORY_CARE_PROVIDER_SITE_OTHER): Payer: Medicare Other | Admitting: Internal Medicine

## 2023-11-10 ENCOUNTER — Encounter: Payer: Self-pay | Admitting: Neurosurgery

## 2023-11-10 ENCOUNTER — Encounter: Payer: Self-pay | Admitting: Internal Medicine

## 2023-11-10 VITALS — BP 124/78 | HR 74 | Ht 65.0 in | Wt 179.0 lb

## 2023-11-10 DIAGNOSIS — E039 Hypothyroidism, unspecified: Secondary | ICD-10-CM | POA: Diagnosis not present

## 2023-11-10 DIAGNOSIS — E1065 Type 1 diabetes mellitus with hyperglycemia: Secondary | ICD-10-CM | POA: Diagnosis not present

## 2023-11-10 DIAGNOSIS — E103299 Type 1 diabetes mellitus with mild nonproliferative diabetic retinopathy without macular edema, unspecified eye: Secondary | ICD-10-CM | POA: Diagnosis not present

## 2023-11-10 DIAGNOSIS — E1042 Type 1 diabetes mellitus with diabetic polyneuropathy: Secondary | ICD-10-CM | POA: Insufficient documentation

## 2023-11-10 LAB — POCT GLYCOSYLATED HEMOGLOBIN (HGB A1C): Hemoglobin A1C: 7.9 % — AB (ref 4.0–5.6)

## 2023-11-10 NOTE — Progress Notes (Signed)
Name: Karla Patton  MRN/ DOB: [id number], 01-16-1951   Age/ Sex: 72 y.o., female    PCP: Bethanie Dicker, NP   Reason for Endocrinology Evaluation: Type 1 Diabetes Mellitus     Date of Initial Endocrinology Visit: 02/27/2022    PATIENT IDENTIFIER: Karla Patton is a 72 y.o. female with a past medical history of T1DM, Hypothyroidism, Dyslipidemia . The patient presented for initial endocrinology clinic visit on 02/27/2022 for consultative assistance with her diabetes management.    HPI: Karla Patton was    Diagnosed with DM at age 63  Prior Medications tried/Intolerance: She was on Medtronic but did not like the tubing   Hemoglobin A1c has ranged from 6.8% in 2022, peaking at 8.9% in 2019. Patient required assistance for hypoglycemia: yes x2 over the years    On her initial visit to our clinic, her A1c is 7.5% she was on Lantus, and NovoLog which we adjusted   She was started on OmniPod in September 2023 but by January 2024 she had to stop the OmniPod due to increasing cost    SUBJECTIVE:   During the last visit (05/14/2023): A1c 7.3%   Today (11/10/23): Karla Patton is here for follow-up on diabetes management.  She checks her blood sugars multiple  times daily, through CGM .The patient has had hypoglycemic episodes since the last clinic visit , she is not symptomatic with these episodes.    She presented to the ED 10/09/2023 with nausea and vomiting as well as right lower quadrant abdominal pain She was evaluated by neurosurgery for bilateral hip and lower leg pain   No recent nausea or vomiting  No constipation or diarrhea   She is on Lyrica 225 mg for neuropathy, has noted tingling around her ankles at night    HOME DIABETES REGIMEN: Lantus 14 units daily -patient assistance NovoLog 1:13-patient assistance CF: Novolog (BG-130/40) TIDQAC Lyrica 225 mg  QHS      Statin: yes ACE-I/ARB: yes     CONTINUOUS GLUCOSE MONITORING RECORD  INTERPRETATION    Dates of Recording:11/6-11/19/2024  Sensor description: dexcom  Results statistics:   CGM use % of time 93  Average and SD 171/72  Time in range 60%  % Time Above 180 23  % Time above 250 14  % Time Below target < 3      Glycemic patterns summary: BG's trend down overnight and fluctuate during the day Hyperglycemic episodes  postprandial  Hypoglycemic episodes occurred after a bolus  Overnight periods: Optimal      DIABETIC COMPLICATIONS: Microvascular complications:  Non proliferative DR, neuropathy  Denies: CKD  Last eye exam: Completed 02/06/2023  Macrovascular complications:   Denies: CAD, PVD, CVA   PAST HISTORY: Past Medical History:  Past Medical History:  Diagnosis Date   Allergy 11/2021   Lisinolpril-cough   Anemia    Anxiety    Arthritis    Back pain    Diabetes mellitus without complication (HCC)    type 1   Emphysema of lung (HCC)    GERD (gastroesophageal reflux disease)    Heart murmur    Hypertension associated with diabetes (HCC) 03/03/2018   Hypothyroidism    Thyroid disease    Varicose vein    Past Surgical History:  Past Surgical History:  Procedure Laterality Date   ABDOMINAL HYSTERECTOMY  1994   CHOLECYSTECTOMY  09/07/2013   COLONOSCOPY  2009   normal   COLONOSCOPY WITH PROPOFOL N/A 10/20/2018   Procedure: COLONOSCOPY WITH PROPOFOL;  Surgeon: Earline Mayotte, MD;  Location[facility] ENDOSCOPY;  Service: Endoscopy;  Laterality: N/A;   INCONTINENCE SURGERY  2003   left leg vein ligation  2015   SPINE SURGERY  03-18-2021   VARICOSE VEIN SURGERY  1990's   Dr Lemar Livings   VEIN SURGERY Left 11/17/2017   vein closure procedure/Dr Evette Cristal    Social History:  reports that she has never smoked. She has never used smokeless tobacco. She reports current alcohol use of about 7.0 standard drinks of alcohol per week. She reports that she does not use drugs. Family History:  Family History  Problem Relation Age of Onset    Diabetes Mother    Pancreatitis Mother    Heart disease Father    Stroke Father        x 2   Hypertension Father    Varicose Veins Father    Fibromyalgia Sister    Scoliosis Sister    Arthritis Sister    Diabetes Sister    Breast cancer Neg Hx      HOME MEDICATIONS: Allergies as of 11/10/2023       Reactions   Lisinopril Cough        Medication List        Accurate as of November 10, 2023 11:57 AM. If you have any questions, ask your nurse or doctor.          ABC PLUS SENIOR ADULTS 50+ PO Take 1 tablet by mouth daily.   acetaminophen 650 MG CR tablet Commonly known as: TYLENOL Take 650 mg by mouth every 8 (eight) hours as needed for pain.   aspirin EC 81 MG tablet Take 81 mg by mouth daily. Takes at bedtime   fluticasone 50 MCG/ACT nasal spray Commonly known as: FLONASE SPRAY 2 SPRAYS INTO EACH NOSTRIL EVERY DAY   furosemide 20 MG tablet Commonly known as: LASIX Take 1 tablet (20 mg) by mouth DAILY AS NEEDED for weight gain or leg swelling   High Potency Iron 65 MG Tabs Take 1 tablet by mouth daily.   irbesartan 75 MG tablet Commonly known as: AVAPRO Take 1 tablet (75 mg total) by mouth daily.   Lantus SoloStar 100 UNIT/ML Solostar Pen Generic drug: insulin glargine Inject 12 Units into the skin daily. What changed: how much to take   levothyroxine 100 MCG tablet Commonly known as: SYNTHROID Take 1 tablet (100 mcg total) by mouth daily before breakfast.   NovoLOG FlexPen 100 UNIT/ML FlexPen Generic drug: insulin aspart Inject 12 units PLUS 0-20 units by sliding scale THREE TIMES DAILY WITH MEALS  SLIDING SCALE WHILE ON STEROIDS:  For CBG 70-120: 0 units For CBG 121-150: 3 units For CBG 151-200: 4 units For CBG 201-250: 7 units For CBG 251-300: 11 units For CBG 301-350: 15 units For CBG 351-400: 20 units For CBG > 400: 25 units and call your doctor   omeprazole 20 MG tablet Commonly known as: PRILOSEC OTC Take 20 mg by mouth  daily.   potassium chloride SA 20 MEQ tablet Commonly known as: KLOR-CON M Take 1 tablet (20 mEq total) by mouth daily as needed. Take along with lasix.   pregabalin 225 MG capsule Commonly known as: Lyrica Take 1 capsule (225 mg total) by mouth at bedtime.   pregabalin 100 MG capsule Commonly known as: Lyrica Take 1 capsule (100 mg total) by mouth 2 (two) times daily as needed for up to 15 days (for increased back or thigh pain). Continue usual dose 225 mg  at bedtime   rosuvastatin 10 MG tablet Commonly known as: CRESTOR Take 1 tablet (10 mg total) by mouth daily.   Vitamin D3 250 MCG (10000 UT) capsule Take 10,000 Units by mouth daily.         ALLERGIES: Allergies  Allergen Reactions   Lisinopril Cough         OBJECTIVE:   VITAL SIGNS: BP 124/78 (BP Location: Left Arm, Patient Position: Sitting, Cuff Size: Large)   Pulse 74   Ht 5\' 5"  (1.651 m)   Wt 179 lb (81.2 kg)   SpO2 96%   BMI 29.79 kg/m      PHYSICAL EXAM:  General: Pt appears well and is in NAD  Lungs: Clear with good BS bilat   Heart: RRR   Extremities:  Lower extremities - trace    Neuro: MS is good with appropriate affect, pt is alert and Ox3    DM foot exam: 05/14/2023  The skin of the feet is without sores or ulcerations, right foot deformity  The pedal pulses are 2+ on right and 2+ on left. The sensation is decreased  to a screening 5.07, 10 gram monofilament on the right     DATA REVIEWED:  Lab Results  Component Value Date   HGBA1C 7.9 (A) 11/10/2023   HGBA1C 7.3 (A) 05/14/2023   HGBA1C 7.0 (A) 11/12/2022     Latest Reference Range & Units 10/20/23 11:20  Sodium 135 - 145 mEq/L 132 (L)  Potassium 3.5 - 5.1 mEq/L 3.9  Chloride 96 - 112 mEq/L 96  CO2 19 - 32 mEq/L 30  Glucose 70 - 99 mg/dL 295 (H)  BUN 6 - 23 mg/dL 13  Creatinine 1.88 - 4.16 mg/dL 6.06  Calcium 8.4 - 30.1 mg/dL 8.7  GFR >60.10 mL/min 89.50    Latest Reference Range & Units 10/20/23 11:19   Creatinine,U mg/dL 93.2  Microalb, Ur 0.0 - 1.9 mg/dL <3.5  MICROALB/CREAT RATIO 0.0 - 30.0 mg/g 3.8      Latest Reference Range & Units 05/01/15 14:41  Glutamic Acid Decarb Ab < IU/mL >250 (H)   ASSESSMENT / PLAN / RECOMMENDATIONS:   1) Type 1 Diabetes Mellitus, Sub-OPtimally controlled, With neuropathic and retinopathic complications - Most recent A1c of 9 %. Goal A1c < 7.0 %.    -A1c has trended up  -Interview incision data, patient has been noted with glycemic excursions, she continues to take NovoLog postmeal, she has also been noted with late night snacking on popcorn -Patient encouraged to bolus with snacks as well -Given post bolus hypoglycemia, I will adjust her insulin to carb ratio as below -No changes to the Lantus dose at this time    MEDICATIONS: Continue Lantus 14 units daily Change Novolog 1:C ratio 1:14 TIDQAC and snacks  Continue correction factor: NovoLog (BG -130/40) TID       EDUCATION / INSTRUCTIONS: BG monitoring instructions: Patient is instructed to check her blood sugars 3 times a day, before meals. Call [place] [facility] clinic if: BG persistently < 70 I reviewed the Rule of 15 for the treatment of hypoglycemia in detail with the patient. Literature supplied.   2) Diabetic complications:  Eye: Does  have known diabetic retinopathy.  Neuro/ Feet: Does  have known diabetic peripheral neuropathy. Renal: Patient does not have known baseline CKD. She is  on an ACEI/ARB at present.      3) Hypothyroidism:   - TSH normal 09/2023  Medication Levothyroxine 100 mcg daily   F/U in 4  months    Signed electronically by: Lyndle Herrlich, MD  [facility]  [facility] [address] [place] [place], [place] [address] Phone: [phone] FAX: [phone]   CC: Bethanie Dicker, NP [address] [place] [address] Phone: [phone]  Fax: [phone]    Return to Endocrinology  clinic as below: Future Appointments  Date Time Provider Department Center  11/11/2023  2:00 PM Johnnette Barrios, RN [facility] None  02/09/2024  1:00 PM Bethanie Dicker, NP [id number]

## 2023-11-10 NOTE — Patient Instructions (Addendum)
Continue Lantus 14 units daily  Novolog one unit for every 14 grams of carbohydrates with meals and snacks  Novolog correctional insulin: ADD extra units on insulin to your meal-time Novolog dose if your blood sugars are higher than 170. Use the scale below to help guide you:   Blood sugar before meal Number of units to inject  Less than 170 0 unit  171 -  210 1 units  211 -  250 2 units  251 -  290 3 units  291 -  330 4 units  331 -  370 5 units  371 -  410 6 units     HOW TO TREAT LOW BLOOD SUGARS (Blood sugar LESS THAN 70 MG/DL) Please follow the RULE OF 15 for the treatment of hypoglycemia treatment (when your (blood sugars are less than 70 mg/dL)   STEP 1: Take 15 grams of carbohydrates when your blood sugar is low, which includes:  3-4 GLUCOSE TABS  OR 3-4 OZ OF JUICE OR REGULAR SODA OR ONE TUBE OF GLUCOSE GEL    STEP 2: RECHECK blood sugar in 15 MINUTES STEP 3: If your blood sugar is still low at the 15 minute recheck --> then, go back to STEP 1 and treat AGAIN with another 15 grams of carbohydrates.

## 2023-11-11 ENCOUNTER — Telehealth: Payer: Self-pay

## 2023-11-11 ENCOUNTER — Other Ambulatory Visit: Payer: Self-pay

## 2023-11-11 NOTE — Telephone Encounter (Signed)
Error

## 2023-11-11 NOTE — Patient Outreach (Unsigned)
  Care Management  Transitions of Care Program Transitions of Care Post-discharge week 5  11/11/2023 Name: Karla Patton MRN: [id number] DOB: 1951-01-26  Subjective: Karla Patton is a 72 y.o. year old female who is a primary care patient of Bethanie Dicker, NP. The Care Management team was unable to reach the patient by phone to assess and address transitions of care needs.   Plan: Additional outreach attempts will be made to reach the patient enrolled in the [facility] Program (Post Inpatient/ED Visit).  Deidre Ala, RN Medical illustrator [facility]-Population Health [phone]

## 2023-11-16 ENCOUNTER — Telehealth: Payer: Self-pay

## 2023-11-16 DIAGNOSIS — D2262 Melanocytic nevi of left upper limb, including shoulder: Secondary | ICD-10-CM | POA: Diagnosis not present

## 2023-11-16 DIAGNOSIS — L570 Actinic keratosis: Secondary | ICD-10-CM | POA: Diagnosis not present

## 2023-11-16 DIAGNOSIS — L821 Other seborrheic keratosis: Secondary | ICD-10-CM | POA: Diagnosis not present

## 2023-11-16 DIAGNOSIS — D225 Melanocytic nevi of trunk: Secondary | ICD-10-CM | POA: Diagnosis not present

## 2023-11-16 DIAGNOSIS — D2261 Melanocytic nevi of right upper limb, including shoulder: Secondary | ICD-10-CM | POA: Diagnosis not present

## 2023-11-16 NOTE — Patient Outreach (Signed)
  Care Management  Transitions of Care Program Transitions of Care Post-discharge Program Completion   11/16/2023 Name: Karla Patton MRN: [id number] DOB: 1951/07/12  Subjective: Karla Patton is a 72 y.o. year old female who is a primary care patient of Bethanie Dicker, NP. The Care Management team was unable to reach the patient by phone to assess and address transitions of care needs.   Plan: No further outreach attempts will be made at this time.  We have been unable to reach the patient.  The patient has successfully completed the 30-day TOC Program. Condition  has been  stable No further acute needs  were identified on chart review or during last call. Chronic conditions and ongoing care is  managed thru collaboration with  PCP,  Specialists and additional Healthcare Providers if indicated . Patient has previously verbalized understanding of ongoing plan of care.  SDOH needs have been screened during  previous calls and interventions provided if identified.   --   Previously Reviewed current home medications and discussed rationale of use, how/when to take medications. Patient is aware of potential side effects, and was encouraged to notify PCP for any changes in condition or signs / symptoms not relieved  with interventions.    Patient will call 911 for Medical Emergencies or Life -Threatening or report to a local emergency department or urgent care.   Patient has been  encouraged to Contact PCP  with any questions or concerns regarding ongoing  medical care, any  difficulty obtaining or picking up  prescriptions, any  changes or  worsening in  condition including signs / symptoms not relieved  with interventions   Susa Loffler , BSN, RN Care Management Coordinator [place]   [facility] [email or web address] Direct Dial: [phone]

## 2023-11-24 ENCOUNTER — Telehealth: Payer: Self-pay

## 2023-11-24 ENCOUNTER — Other Ambulatory Visit (HOSPITAL_COMMUNITY): Payer: Self-pay

## 2023-11-24 NOTE — Telephone Encounter (Signed)
Pt has read Mychart message but not responded regarding 2025 re enrollment

## 2023-11-24 NOTE — Telephone Encounter (Signed)
Called patient and left HIPAA Compliant VM.

## 2023-12-04 ENCOUNTER — Other Ambulatory Visit (HOSPITAL_COMMUNITY): Payer: Self-pay

## 2024-01-01 ENCOUNTER — Other Ambulatory Visit (HOSPITAL_COMMUNITY): Payer: Self-pay

## 2024-01-01 NOTE — Telephone Encounter (Signed)
 Have received pt portions of both [employer] and [employer] and have sent provider portions for both applications

## 2024-01-04 ENCOUNTER — Telehealth: Payer: Self-pay

## 2024-01-04 ENCOUNTER — Other Ambulatory Visit: Payer: Self-pay | Admitting: Internal Medicine

## 2024-01-04 NOTE — Telephone Encounter (Signed)
 Patient Assistance form received for Lisinopril for pt, forms placed in provider to be signed folder.

## 2024-01-04 NOTE — Telephone Encounter (Signed)
 [employer] application forms received from Pharmacy team needing provider signature form placed in provider to be signed folder

## 2024-01-05 NOTE — Telephone Encounter (Signed)
 Forms faxed with Novo nordisk forms to [phone] with a completed transmission log

## 2024-01-06 NOTE — Telephone Encounter (Signed)
 Received! Thank you so much! I'll get these sent off

## 2024-01-08 ENCOUNTER — Telehealth: Payer: Self-pay

## 2024-01-08 NOTE — Telephone Encounter (Signed)
Form has been placed up front for patient to come by and sign next week .

## 2024-01-12 DIAGNOSIS — M720 Palmar fascial fibromatosis [Dupuytren]: Secondary | ICD-10-CM | POA: Diagnosis not present

## 2024-01-12 DIAGNOSIS — M65331 Trigger finger, right middle finger: Secondary | ICD-10-CM | POA: Diagnosis not present

## 2024-01-13 ENCOUNTER — Encounter: Payer: Self-pay | Admitting: Nurse Practitioner

## 2024-01-13 NOTE — Telephone Encounter (Signed)
Received provider portions for [employer], [employer], applications to respective companies

## 2024-01-14 MED ORDER — IRBESARTAN 75 MG PO TABS: 75.0000 mg | ORAL_TABLET | Freq: Every day | ORAL | 3 refills | Status: DC

## 2024-01-22 NOTE — Progress Notes (Signed)
Pharmacy Medication Assistance Program Note    01/22/2024  Patient ID: Karla Patton, female   DOB: 12/09/1951, 73 y.o.   MRN: [id number]     01/22/2024  Outreach Medication One  Manufacturer Medication One [employer] Drugs Novolog  Type of Radiographer, therapeutic Assistance  Date Application Sent to Patient 11/24/2023  Application Items Requested Application;Proof of Income  Name of Prescriber Bethanie Dicker  Method Application Sent to Manufacturer Fax  Patient Assistance Determination Approved  Approval Start Date 01/22/2024  Approval End Date 12/21/2024  Patient Notification Method MyChart

## 2024-01-28 ENCOUNTER — Other Ambulatory Visit: Payer: Self-pay | Admitting: Nurse Practitioner

## 2024-01-28 DIAGNOSIS — E1069 Type 1 diabetes mellitus with other specified complication: Secondary | ICD-10-CM

## 2024-02-01 ENCOUNTER — Telehealth: Payer: Medicare Other | Admitting: Nurse Practitioner

## 2024-02-01 DIAGNOSIS — J40 Bronchitis, not specified as acute or chronic: Secondary | ICD-10-CM | POA: Diagnosis not present

## 2024-02-01 MED ORDER — BENZONATATE 100 MG PO CAPS: 100.0000 mg | ORAL_CAPSULE | Freq: Three times a day (TID) | ORAL | 0 refills | Status: DC | PRN

## 2024-02-01 MED ORDER — DOXYCYCLINE HYCLATE 100 MG PO TABS: 100.0000 mg | ORAL_TABLET | Freq: Two times a day (BID) | ORAL | 0 refills | Status: AC

## 2024-02-01 NOTE — Progress Notes (Signed)
 E-Visit for Cough   We are sorry that you are not feeling well.  Here is how we plan to help!  Based on your presentation I believe you most likely have A cough due to bacteria.  When patients have a fever and a productive cough with a change in color or increased sputum production, we are concerned about bacterial bronchitis.  If left untreated it can progress to pneumonia.  If your symptoms do not improve with your treatment plan it is important that you contact your provider.   I have prescribed Doxycycline  100 mg twice a day for 7 days     In addition you may use A prescription cough medication called Tessalon  Perles 100mg . You may take 1-2 capsules every 8 hours as needed for your cough.    From your responses in the eVisit questionnaire you describe inflammation in the upper respiratory tract which is causing a significant cough.  This is commonly called Bronchitis and has four common causes:   Allergies Viral Infections Acid Reflux Bacterial Infection Allergies, viruses and acid reflux are treated by controlling symptoms or eliminating the cause. An example might be a cough caused by taking certain blood pressure medications. You stop the cough by changing the medication. Another example might be a cough caused by acid reflux. Controlling the reflux helps control the cough.      HOME CARE Only take medications as instructed by your medical team. Complete the entire course of an antibiotic. Drink plenty of fluids and get plenty of rest. Avoid close contacts especially the very young and the elderly Cover your mouth if you cough or cough into your sleeve. Always remember to wash your hands A steam or ultrasonic humidifier can help congestion.   GET HELP RIGHT AWAY IF: You develop worsening fever. You become short of breath You cough up blood. Your symptoms persist after you have completed your treatment plan MAKE SURE YOU  Understand these instructions. Will watch your  condition. Will get help right away if you are not doing well or get worse.    Thank you for choosing an e-visit.  Your e-visit answers were reviewed by a board certified advanced clinical practitioner to complete your personal care plan. Depending upon the condition, your plan could have included both over the counter or prescription medications.  Please review your pharmacy choice. Make sure the pharmacy is open so you can pick up prescription now. If there is a problem, you may contact your provider through [employer] and have the prescription routed to another pharmacy.  Your safety is important to us . If you have drug allergies check your prescription carefully.   For the next 24 hours you can use MyChart to ask questions about today's visit, request a non-urgent call back, or ask for a work or school excuse. You will get an email in the next two days asking about your experience. I hope that your e-visit has been valuable and will speed your recovery.   Meds ordered this encounter  Medications   doxycycline  (VIBRA -TABS) 100 MG tablet    Sig: Take 1 tablet (100 mg total) by mouth 2 (two) times daily for 7 days.    Dispense:  14 tablet    Refill:  0   benzonatate  (TESSALON ) 100 MG capsule    Sig: Take 1 capsule (100 mg total) by mouth 3 (three) times daily as needed.    Dispense:  30 capsule    Refill:  0    I  spent approximately 5 minutes reviewing the patient's history, current symptoms and coordinating their care today.

## 2024-02-09 ENCOUNTER — Encounter: Payer: Self-pay | Admitting: Nurse Practitioner

## 2024-02-09 ENCOUNTER — Ambulatory Visit (INDEPENDENT_AMBULATORY_CARE_PROVIDER_SITE_OTHER): Payer: Medicare Other | Admitting: Nurse Practitioner

## 2024-02-09 VITALS — BP 128/80 | HR 64 | Temp 98.0°F | Ht 65.0 in | Wt 181.2 lb

## 2024-02-09 DIAGNOSIS — E785 Hyperlipidemia, unspecified: Secondary | ICD-10-CM

## 2024-02-09 DIAGNOSIS — I152 Hypertension secondary to endocrine disorders: Secondary | ICD-10-CM | POA: Diagnosis not present

## 2024-02-09 DIAGNOSIS — R32 Unspecified urinary incontinence: Secondary | ICD-10-CM | POA: Diagnosis not present

## 2024-02-09 DIAGNOSIS — E039 Hypothyroidism, unspecified: Secondary | ICD-10-CM

## 2024-02-09 DIAGNOSIS — E1069 Type 1 diabetes mellitus with other specified complication: Secondary | ICD-10-CM

## 2024-02-09 DIAGNOSIS — E1059 Type 1 diabetes mellitus with other circulatory complications: Secondary | ICD-10-CM | POA: Diagnosis not present

## 2024-02-09 DIAGNOSIS — J40 Bronchitis, not specified as acute or chronic: Secondary | ICD-10-CM | POA: Diagnosis not present

## 2024-02-09 DIAGNOSIS — E103293 Type 1 diabetes mellitus with mild nonproliferative diabetic retinopathy without macular edema, bilateral: Secondary | ICD-10-CM | POA: Diagnosis not present

## 2024-02-09 DIAGNOSIS — E559 Vitamin D deficiency, unspecified: Secondary | ICD-10-CM | POA: Diagnosis not present

## 2024-02-09 DIAGNOSIS — E1159 Type 2 diabetes mellitus with other circulatory complications: Secondary | ICD-10-CM

## 2024-02-09 LAB — COMPREHENSIVE METABOLIC PANEL
ALT: 24 U/L (ref 0–35)
AST: 25 U/L (ref 0–37)
Albumin: 4.3 g/dL (ref 3.5–5.2)
Alkaline Phosphatase: 91 U/L (ref 39–117)
BUN: 10 mg/dL (ref 6–23)
CO2: 31 meq/L (ref 19–32)
Calcium: 9.4 mg/dL (ref 8.4–10.5)
Chloride: 92 meq/L — ABNORMAL LOW (ref 96–112)
Creatinine, Ser: 0.63 mg/dL (ref 0.40–1.20)
GFR: 89.31 mL/min (ref >60.00)
Glucose, Bld: 108 mg/dL — ABNORMAL HIGH (ref 70–99)
Potassium: 4.3 meq/L (ref 3.5–5.1)
Sodium: 130 meq/L — ABNORMAL LOW (ref 135–145)
Total Bilirubin: 0.5 mg/dL (ref 0.2–1.2)
Total Protein: 7.3 g/dL (ref 6.0–8.3)

## 2024-02-09 LAB — CBC WITH DIFFERENTIAL/PLATELET
Basophils Absolute: 0.1 10*3/uL (ref 0.0–0.1)
Basophils Relative: 1.1 % (ref 0.0–3.0)
Eosinophils Absolute: 0.2 10*3/uL (ref 0.0–0.7)
Eosinophils Relative: 2.3 % (ref 0.0–5.0)
HCT: 36.3 % (ref 36.0–46.0)
Hemoglobin: 12.4 g/dL (ref 12.0–15.0)
Lymphocytes Relative: 40.4 % (ref 12.0–46.0)
Lymphs Abs: 2.6 10*3/uL (ref 0.7–4.0)
MCHC: 34.2 g/dL (ref 30.0–36.0)
MCV: 89 fL (ref 78.0–100.0)
Monocytes Absolute: 0.6 10*3/uL (ref 0.1–1.0)
Monocytes Relative: 9.5 % (ref 3.0–12.0)
Neutro Abs: 3.1 10*3/uL (ref 1.4–7.7)
Neutrophils Relative %: 46.7 % (ref 43.0–77.0)
Platelets: 388 10*3/uL (ref 150.0–400.0)
RBC: 4.08 Mil/uL (ref 3.87–5.11)
RDW: 13.1 % (ref 11.5–15.5)
WBC: 6.6 10*3/uL (ref 4.0–10.5)

## 2024-02-09 LAB — LIPID PANEL
Cholesterol: 155 mg/dL (ref 0–200)
HDL: 63.7 mg/dL (ref >39.00)
LDL Cholesterol: 72 mg/dL (ref 0–99)
NonHDL: 91.56
Total CHOL/HDL Ratio: 2
Triglycerides: 96 mg/dL (ref 0.0–149.0)
VLDL: 19.2 mg/dL (ref 0.0–40.0)

## 2024-02-09 LAB — TSH: TSH: 1.09 u[IU]/mL (ref 0.35–5.50)

## 2024-02-09 LAB — VITAMIN D 25 HYDROXY (VIT D DEFICIENCY, FRACTURES): VITD: 33.73 ng/mL (ref 30.00–100.00)

## 2024-02-09 MED ORDER — BENZONATATE 100 MG PO CAPS
100.0000 mg | ORAL_CAPSULE | Freq: Three times a day (TID) | ORAL | 0 refills | Status: DC | PRN
Start: 2024-02-09 — End: 2024-04-22

## 2024-02-09 MED ORDER — MIRABEGRON ER 25 MG PO TB24: 25.0000 mg | ORAL_TABLET | Freq: Every day | ORAL | 3 refills | Status: DC

## 2024-02-09 MED ORDER — PREDNISONE 20 MG PO TABS: 40.0000 mg | ORAL_TABLET | Freq: Every day | ORAL | 0 refills | Status: DC

## 2024-02-09 NOTE — Progress Notes (Signed)
 Bethanie Dicker, NP-C Phone: [phone]  Karla Patton is a 73 y.o. female who presents today for follow up.   Discussed the use of AI scribe software for clinical note transcription with the patient, who gave verbal consent to proceed.  History of Present Illness   Karla Patton is a 73 year old female with hypertension and diabetes who presents with persistent bronchitis symptoms.  She has persistent symptoms of bronchitis, including sinus congestion and a burning sensation when breathing. She completed a seven-day course of doxycycline last night, which was prescribed for these symptoms. There is improvement in her cough since starting Tessalon Perles. No chest pain, shortness of breath, fever, or flu-like body aches, although she mentions a burning sensation when breathing. She has a history of bronchitis diagnosed by another provider.  She has a history of hypertension and notes that her blood pressure was slightly elevated today. She sometimes checks her blood pressure at home but not consistently. She is currently taking Lisinopril daily and inquires about the possibility of increasing the dosage due to today's elevated reading. No dizziness and reports less swelling in her ankles than before.  She has a history of diabetes and is managed by endocrinology. She uses Lantus and Novolog for her diabetes and has a Dexcom for monitoring her blood sugar levels. She reports occasional low blood sugar episodes, particularly at night, and manages these with candy or a cracker. Her last A1c in November was 7.4%.  She has a history of bladder issues and has undergone three bladder surgeries, with the last involving mesh placement. She experiences urinary urgency, especially in the mornings, and has used pull-ups occasionally. No burning with urination or other urinary symptoms.  She mentions a history of a pinched nerve diagnosed after an ER visit, where she stayed for four days. She was  referred to a neurologist who suggested an orthopedic evaluation. She has had back surgery in the past and is awaiting a new appointment after missing a previous one. She also reports pain and itching at her hysterectomy scar and has had bladder mesh surgery at [facility].  She is currently seeing a specialist for Dupuytren's contracture and has received three injections, with the last one being less than a month ago. She reports significant pain, especially when using her right hand, which is her dominant hand.      Social History   Tobacco Use  Smoking Status Never  Smokeless Tobacco Never    Current Outpatient Medications on File Prior to Visit  Medication Sig Dispense Refill   aspirin EC 81 MG tablet Take 81 mg by mouth daily. Takes at bedtime     Cholecalciferol (VITAMIN D3) 250 MCG (10000 UT) capsule Take 10,000 Units by mouth daily.     Ferrous Sulfate Dried (HIGH POTENCY IRON) 65 MG TABS Take 1 tablet by mouth daily.     fluticasone (FLONASE) 50 MCG/ACT nasal spray SPRAY 2 SPRAYS INTO EACH NOSTRIL EVERY DAY 48 mL 1   insulin aspart (NOVOLOG FLEXPEN) 100 UNIT/ML FlexPen Inject 12 units PLUS 0-20 units by sliding scale THREE TIMES DAILY WITH MEALS  SLIDING SCALE WHILE ON STEROIDS:  For CBG 70-120: 0 units For CBG 121-150: 3 units For CBG 151-200: 4 units For CBG 201-250: 7 units For CBG 251-300: 11 units For CBG 301-350: 15 units For CBG 351-400: 20 units For CBG > 400: 25 units and call your doctor     insulin glargine (LANTUS SOLOSTAR) 100 UNIT/ML Solostar Pen Inject 12  Units into the skin daily. (Patient taking differently: Inject 14 Units into the skin daily.) 15 mL 4   irbesartan (AVAPRO) 75 MG tablet Take 1 tablet (75 mg total) by mouth daily. 90 tablet 3   levothyroxine (SYNTHROID) 100 MCG tablet Take 1 tablet (100 mcg total) by mouth daily before breakfast. 90 tablet 3   Multiple Vitamins-Minerals (ABC PLUS SENIOR ADULTS 50+ PO) Take 1 tablet by mouth daily.     omeprazole  (PRILOSEC OTC) 20 MG tablet Take 20 mg by mouth daily.     potassium chloride SA (KLOR-CON M) 20 MEQ tablet Take 1 tablet (20 mEq total) by mouth daily as needed. Take along with lasix.     pregabalin (LYRICA) 225 MG capsule Take 1 capsule by mouth at bedtime 90 capsule 0   rosuvastatin (CRESTOR) 10 MG tablet Take 1 tablet by mouth once daily 90 tablet 3   acetaminophen (TYLENOL) 650 MG CR tablet Take 650 mg by mouth every 8 (eight) hours as needed for pain.     No current facility-administered medications on file prior to visit.    ROS see history of present illness  Objective  Physical Exam Vitals:   02/09/24 1310 02/09/24 1335  BP: (!) 140/86 128/80  Pulse: 64   Temp: 98 F (36.7 C)   SpO2: 99%     BP Readings from Last 3 Encounters:  02/09/24 128/80  11/10/23 124/78  10/28/23 (!) 140/84   Wt Readings from Last 3 Encounters:  02/09/24 181 lb 3.2 oz (82.2 kg)  11/10/23 179 lb (81.2 kg)  10/28/23 174 lb (78.9 kg)    Physical Exam Constitutional:      General: She is not in acute distress.    Appearance: Normal appearance.  HENT:     Head: Normocephalic.  Cardiovascular:     Rate and Rhythm: Normal rate and regular rhythm.     Heart sounds: Murmur (chronic) heard.  Pulmonary:     Effort: Pulmonary effort is normal.     Breath sounds: Normal breath sounds.  Skin:    General: Skin is warm and dry.  Neurological:     General: No focal deficit present.     Mental Status: She is alert.  Psychiatric:        Mood and Affect: Mood normal.        Behavior: Behavior normal.    Assessment/Plan: Please see individual problem list.  Bronchitis Assessment & Plan: Recent improvement with antibiotics, but congestion and a burning sensation in the chest persist. Start Prednisone and refill Tessalon Perles for cough. Counseled on common side effects. Advised adequate hydration. Return precautions given to patient.    Orders: -     Benzonatate; Take 1 capsule (100 mg  total) by mouth 3 (three) times daily as needed.  Dispense: 30 capsule; Refill: 0 -     predniSONE; Take 2 tablets (40 mg total) by mouth daily.  Dispense: 10 tablet; Refill: 0  Hypertension associated with diabetes (HCC) Assessment & Plan: Blood pressure is slightly elevated today without dizziness or chest pain. Improvement with second reading. Currently on Irbesartan 75 mg daily. Continue.   Orders: -     CBC with Differential/Platelet -     Comprehensive metabolic panel  Urinary incontinence, unspecified type Assessment & Plan: Reports frequent urination with a history of bladder surgeries and mesh placement. No dysuria or hematuria. Start Myrbetriq if covered by insurance. She will contact if medication is expensive/not covered for an alternative.  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 Currently on Lantus and Novolog with a recent A1C of 7.4. Continue current regimen and monitor blood sugars closely, especially with the initiation of Prednisone. Follow up with Endocrinology as scheduled.    Hyperlipidemia due to type 1 diabetes mellitus (HCC) Assessment & Plan: Continue Crestor 10 mg daily. We will check lipid panel today.   Orders: -     Lipid panel  Hypothyroidism, unspecified type Assessment & Plan: Continue Levothyroxine 100 mcg daily. Asymptomatic. We will check TSH today.   Orders: -     TSH  Vitamin D deficiency -     VITAMIN D 25 Hydroxy (Vit-D Deficiency, Fractures)   Return in about 6 months (around 08/08/2024) for Follow up.   Bethanie Dicker, NP-C [facility]

## 2024-02-10 ENCOUNTER — Telehealth: Payer: Self-pay

## 2024-02-10 NOTE — Telephone Encounter (Signed)
PAP: Patient assistance application for Lantus has been approved by PAP Companies: [employer] from 02/09/2024 to 12/21/2024. Medication should be delivered to PAP Delivery: Provider's office. For further shipping updates, please contact [employer] at [phone]. Patient ID is: [id number] Shipment will arrive in 3-5 Bus. Days of approval date.

## 2024-02-12 DIAGNOSIS — E119 Type 2 diabetes mellitus without complications: Secondary | ICD-10-CM | POA: Diagnosis not present

## 2024-02-12 DIAGNOSIS — H2513 Age-related nuclear cataract, bilateral: Secondary | ICD-10-CM | POA: Diagnosis not present

## 2024-02-12 DIAGNOSIS — H40003 Preglaucoma, unspecified, bilateral: Secondary | ICD-10-CM | POA: Diagnosis not present

## 2024-02-12 DIAGNOSIS — H4322 Crystalline deposits in vitreous body, left eye: Secondary | ICD-10-CM | POA: Diagnosis not present

## 2024-02-12 LAB — HM DIABETES EYE EXAM

## 2024-02-16 ENCOUNTER — Other Ambulatory Visit: Payer: Self-pay | Admitting: Nurse Practitioner

## 2024-02-16 ENCOUNTER — Encounter: Payer: Self-pay | Admitting: Nurse Practitioner

## 2024-02-16 DIAGNOSIS — R32 Unspecified urinary incontinence: Secondary | ICD-10-CM

## 2024-02-16 DIAGNOSIS — J40 Bronchitis, not specified as acute or chronic: Secondary | ICD-10-CM | POA: Insufficient documentation

## 2024-02-16 MED ORDER — OXYBUTYNIN CHLORIDE ER 5 MG PO TB24: 5.0000 mg | ORAL_TABLET | Freq: Every day | ORAL | 3 refills | Status: DC

## 2024-02-16 NOTE — Assessment & Plan Note (Signed)
 Continue Crestor 10 mg daily. We will check lipid panel today.

## 2024-02-16 NOTE — Assessment & Plan Note (Signed)
 Reports frequent urination with a history of bladder surgeries and mesh placement. No dysuria or hematuria. Start Myrbetriq if covered by insurance. She will contact if medication is expensive/not covered for an alternative.

## 2024-02-16 NOTE — Assessment & Plan Note (Signed)
 Blood pressure is slightly elevated today without dizziness or chest pain. Improvement with second reading. Currently on Irbesartan 75 mg daily. Continue.

## 2024-02-16 NOTE — Assessment & Plan Note (Signed)
 Reports occasional hypoglycemia. Currently on Lantus and Novolog with a recent A1C of 7.4. Continue current regimen and monitor blood sugars closely, especially with the initiation of Prednisone. Follow up with Endocrinology as scheduled.

## 2024-02-16 NOTE — Assessment & Plan Note (Addendum)
 Continue Levothyroxine 100 mcg daily. Asymptomatic. We will check TSH today.

## 2024-02-16 NOTE — Assessment & Plan Note (Addendum)
 Recent improvement with antibiotics, but congestion and a burning sensation in the chest persist. Start Prednisone and refill Tessalon Perles for cough. Counseled on common side effects. Advised adequate hydration. Return precautions given to patient.

## 2024-03-07 ENCOUNTER — Telehealth: Payer: Self-pay

## 2024-03-07 NOTE — Telephone Encounter (Signed)
 Pt has been informed of her patient assistance meds for NovoLog Flex Pen being ready for pick up.   Pt stated they told her it would be at her Endocrinologists office she has an appt with them on tomorrow and she stated she will come by tomorrow to pick up meds and needles.   Medication: NovoLog FlexPen 100 units/mL   Exp:04-20-26 LOT: [id number] QTY: 6 boxes   Novofine 32G tip needles  QTY 4 boxes

## 2024-03-08 ENCOUNTER — Other Ambulatory Visit: Payer: Self-pay | Admitting: Internal Medicine

## 2024-03-08 ENCOUNTER — Encounter: Payer: Self-pay | Admitting: Internal Medicine

## 2024-03-08 ENCOUNTER — Other Ambulatory Visit: Payer: Self-pay

## 2024-03-08 ENCOUNTER — Telehealth: Payer: Self-pay

## 2024-03-08 ENCOUNTER — Ambulatory Visit: Payer: Medicare Other | Admitting: Internal Medicine

## 2024-03-08 VITALS — BP 132/88 | HR 69 | Ht 65.0 in | Wt 182.0 lb

## 2024-03-08 DIAGNOSIS — R200 Anesthesia of skin: Secondary | ICD-10-CM | POA: Insufficient documentation

## 2024-03-08 DIAGNOSIS — E039 Hypothyroidism, unspecified: Secondary | ICD-10-CM | POA: Diagnosis not present

## 2024-03-08 DIAGNOSIS — E1065 Type 1 diabetes mellitus with hyperglycemia: Secondary | ICD-10-CM

## 2024-03-08 LAB — POCT GLYCOSYLATED HEMOGLOBIN (HGB A1C): Hemoglobin A1C: 7.4 % — AB (ref 4.0–5.6)

## 2024-03-08 LAB — GLUCOSE, POCT (MANUAL RESULT ENTRY): POC Glucose: 115 mg/dL — AB (ref 70–99)

## 2024-03-08 LAB — POCT GLUCOSE (DEVICE FOR HOME USE): POC Glucose: 65 mg/dL — AB (ref 70–99)

## 2024-03-08 MED ORDER — LEVOTHYROXINE SODIUM 100 MCG PO TABS: 100.0000 ug | ORAL_TABLET | Freq: Every day | ORAL | 3 refills | Status: DC

## 2024-03-08 MED ORDER — CONTOUR NEXT TEST VI STRP: 1.0000 | ORAL_STRIP | Freq: Three times a day (TID) | 3 refills | Status: DC

## 2024-03-08 MED ORDER — CONTOUR NEXT EZ W/DEVICE KIT: 1.0000 | PACK | Freq: Three times a day (TID) | 0 refills | Status: DC

## 2024-03-08 MED ORDER — ACCU-CHEK GUIDE TEST VI STRP: ORAL_STRIP | 12 refills | Status: DC

## 2024-03-08 NOTE — Telephone Encounter (Signed)
 Patient picked up the patient assistance medications.

## 2024-03-08 NOTE — Patient Instructions (Addendum)
 Decrease Lantus 14 units daily  Novolog one unit for every 15 grams of carbohydrates with meals and snacks  Novolog correctional insulin: ADD extra units on insulin to your meal-time Novolog dose if your blood sugars are higher than 170. Use the scale below to help guide you:   Blood sugar before meal Number of units to inject  Less than 170 0 unit  171 -  210 1 units  211 -  250 2 units  251 -  290 3 units  291 -  330 4 units  331 -  370 5 units  371 -  410 6 units     HOW TO TREAT LOW BLOOD SUGARS (Blood sugar LESS THAN 70 MG/DL) Please follow the RULE OF 15 for the treatment of hypoglycemia treatment (when your (blood sugars are less than 70 mg/dL)   STEP 1: Take 15 grams of carbohydrates when your blood sugar is low, which includes:  3-4 GLUCOSE TABS  OR 3-4 OZ OF JUICE OR REGULAR SODA OR ONE TUBE OF GLUCOSE GEL    STEP 2: RECHECK blood sugar in 15 MINUTES STEP 3: If your blood sugar is still low at the 15 minute recheck --> then, go back to STEP 1 and treat AGAIN with another 15 grams of carbohydrates.

## 2024-03-08 NOTE — Progress Notes (Signed)
 Name: Karla Patton  MRN/ DOB: [id number], [birth year]   Age/ Sex: 73 y.o., female    PCP: Bethanie Dicker, NP   Reason for Endocrinology Evaluation: Type 1 Diabetes Mellitus     Date of Initial Endocrinology Visit: 02/27/2022    PATIENT IDENTIFIER: Karla Patton is a 73 y.o. female with a past medical history of T1DM, Hypothyroidism, Dyslipidemia . The patient presented for initial endocrinology clinic visit on 02/27/2022 for consultative assistance with her diabetes management.    HPI: Karla Patton was    Diagnosed with DM at age 47  Prior Medications tried/Intolerance: She was on [employer] but did not like the tubing   Hemoglobin A1c has ranged from 6.8% in 2022, peaking at 8.9% in 2019. Patient required assistance for hypoglycemia: yes x2 over the years    On her initial visit to our clinic, her A1c is 7.5% she was on Lantus, and NovoLog which we adjusted   She was started on OmniPod in September 2023 but by January 2024 she had to stop the OmniPod due to increasing cost    SUBJECTIVE:   During the last visit (11/10/2023): A1c 7.9%   Today (03/08/24): Karla Patton is here for follow-up on diabetes management.  She checks her blood sugars multiple  times daily, through CGM .The patient has had hypoglycemic episodes since the last clinic visit , she is not symptomatic with these episodes.    She has a cut on her leg after shaving a few days ago , no discharge  No recent nausea or vomiting  No constipation or diarrhea  She has self increase Lantus from 14 units to 16 units due to being on prednisone   She is on Lyrica 225 mg for neuropathy, has noted tingling around her ankles at night    HOME DIABETES REGIMEN: Lantus 14 units daily -patient assistance- has been taking 16 NovoLog 1:15-patient assistance CF: Novolog (BG-130/40) TIDQAC Levothyroxine 100 mcg daily  Lyrica 225 mg  QHS      Statin: yes ACE-I/ARB: yes     CONTINUOUS GLUCOSE MONITORING  RECORD INTERPRETATION    Dates of Recording:3/8-3/18/2025  Sensor description: dexcom  Results statistics:   CGM use % of time 92  Average and SD 178/75  Time in range 56%  % Time Above 180 24  % Time above 250 18  % Time Below target < 2      Glycemic patterns summary: BGs are optimal overnight and fluctuate throughout the day Hyperglycemic episodes  postprandial  Hypoglycemic episodes occurred after a bolus  Overnight periods: Variable without a specific pattern      DIABETIC COMPLICATIONS: Microvascular complications:  Non proliferative DR, neuropathy  Denies: CKD  Last eye exam: Completed 02/06/2023  Macrovascular complications:   Denies: CAD, PVD, CVA   PAST HISTORY: Past Medical History:  Past Medical History:  Diagnosis Date   Allergy 11/2021   Lisinolpril-cough   Anemia    Anxiety    Arthritis    Back pain    Diabetes mellitus without complication (HCC)    type 1   Emphysema of lung (HCC)    GERD (gastroesophageal reflux disease)    Heart murmur    Hypertension associated with diabetes (HCC) 03/03/2018   Hypothyroidism    Thyroid disease    Varicose vein    Past Surgical History:  Past Surgical History:  Procedure Laterality Date   ABDOMINAL HYSTERECTOMY  1994   CHOLECYSTECTOMY  09/07/2013   COLONOSCOPY  2009  normal   COLONOSCOPY WITH PROPOFOL N/A 10/20/2018   Procedure: COLONOSCOPY WITH PROPOFOL;  Surgeon: Earline Mayotte, MD;  Location: [facility];  Service: Endoscopy;  Laterality: N/A;   INCONTINENCE SURGERY  2003   left leg vein ligation  2015   SPINE SURGERY  03-18-2021   VARICOSE VEIN SURGERY  1990's   Dr Lemar Livings   VEIN SURGERY Left 11/17/2017   vein closure procedure/Dr Evette Cristal    Social History:  reports that she has never smoked. She has never used smokeless tobacco. She reports current alcohol use of about 7.0 standard drinks of alcohol per week. She reports that she does not use drugs. Family History:  Family  History  Problem Relation Age of Onset   Diabetes Mother    Pancreatitis Mother    Heart disease Father    Stroke Father        x 2   Hypertension Father    Varicose Veins Father    Fibromyalgia Sister    Scoliosis Sister    Arthritis Sister    Diabetes Sister    Breast cancer Neg Hx      HOME MEDICATIONS: Allergies as of 03/08/2024       Reactions   Lisinopril Cough        Medication List        Accurate as of March 08, 2024 12:24 PM. If you have any questions, ask your nurse or doctor.          STOP taking these medications    predniSONE 20 MG tablet Commonly known as: DELTASONE Stopped by: Johnney Ou Gunnar Hereford       TAKE these medications    ABC PLUS SENIOR ADULTS 50+ PO Take 1 tablet by mouth daily.   acetaminophen 650 MG CR tablet Commonly known as: TYLENOL Take 650 mg by mouth every 8 (eight) hours as needed for pain.   aspirin EC 81 MG tablet Take 81 mg by mouth daily. Takes at bedtime   benzonatate 100 MG capsule Commonly known as: TESSALON Take 1 capsule (100 mg total) by mouth 3 (three) times daily as needed.   Contour Next EZ w/Device Kit 1 Device by Does not apply route 3 (three) times daily. Started by: Scarlette Shorts   Contour Next Test test strip Generic drug: glucose blood 1 each by Other route 3 (three) times daily. Use as instructed Started by: Johnney Ou Nicolette Gieske   fluticasone 50 MCG/ACT nasal spray Commonly known as: FLONASE SPRAY 2 SPRAYS INTO EACH NOSTRIL EVERY DAY   High Potency Iron 65 MG Tabs Take 1 tablet by mouth daily.   irbesartan 75 MG tablet Commonly known as: AVAPRO Take 1 tablet (75 mg total) by mouth daily.   Lantus SoloStar 100 UNIT/ML Solostar Pen Generic drug: insulin glargine Inject 12 Units into the skin daily. What changed: how much to take   levothyroxine 100 MCG tablet Commonly known as: SYNTHROID Take 1 tablet (100 mcg total) by mouth daily before breakfast.   NovoLOG FlexPen 100  UNIT/ML FlexPen Generic drug: insulin aspart Inject 12 units PLUS 0-20 units by sliding scale THREE TIMES DAILY WITH MEALS  SLIDING SCALE WHILE ON STEROIDS:  For CBG 70-120: 0 units For CBG 121-150: 3 units For CBG 151-200: 4 units For CBG 201-250: 7 units For CBG 251-300: 11 units For CBG 301-350: 15 units For CBG 351-400: 20 units For CBG > 400: 25 units and call your doctor   omeprazole 20 MG tablet Commonly known as:  PRILOSEC OTC Take 20 mg by mouth daily.   oxybutynin 5 MG 24 hr tablet Commonly known as: DITROPAN-XL Take 1 tablet (5 mg total) by mouth at bedtime.   potassium chloride SA 20 MEQ tablet Commonly known as: KLOR-CON M Take 1 tablet (20 mEq total) by mouth daily as needed. Take along with lasix.   pregabalin 225 MG capsule Commonly known as: LYRICA Take 1 capsule by mouth at bedtime   rosuvastatin 10 MG tablet Commonly known as: CRESTOR Take 1 tablet by mouth once daily   Vitamin D3 250 MCG (10000 UT) capsule Take 10,000 Units by mouth daily.         ALLERGIES: Allergies  Allergen Reactions   Lisinopril Cough         OBJECTIVE:   VITAL SIGNS: BP 132/88   Pulse 69   Ht 5\' 5"  (1.651 m)   Wt 182 lb (82.6 kg)   SpO2 97%   BMI 30.29 kg/m      PHYSICAL EXAM:  General: Pt appears well and is in NAD  Lungs: Clear with good BS bilat   Heart: RRR   Extremities:  Lower extremities - trace    Neuro: MS is good with appropriate affect, pt is alert and Ox3    DM foot exam: 03/08/2024  The skin of the feet is without sores or ulcerations, right foot deformity  The pedal pulses are 2+ on right and 2+ on left. The sensation is decreased  to a screening 5.07, 10 gram monofilament on the right     DATA REVIEWED:  Lab Results  Component Value Date   HGBA1C 7.4 (A) 03/08/2024   HGBA1C 7.9 (A) 11/10/2023   HGBA1C 7.3 (A) 05/14/2023     Latest Reference Range & Units 02/09/24 13:35  Sodium 135 - 145 mEq/L 130 (L)  Potassium 3.5 - 5.1  mEq/L 4.3  Chloride 96 - 112 mEq/L 92 (L)  CO2 19 - 32 mEq/L 31  Glucose 70 - 99 mg/dL 161 (H)  BUN 6 - 23 mg/dL 10  Creatinine 0.96 - 0.45 mg/dL 4.09  Calcium 8.4 - 81.1 mg/dL 9.4  Alkaline Phosphatase 39 - 117 U/L 91  Albumin 3.5 - 5.2 g/dL 4.3  AST 0 - 37 U/L 25  ALT 0 - 35 U/L 24  Total Protein 6.0 - 8.3 g/dL 7.3  Total Bilirubin 0.2 - 1.2 mg/dL 0.5  GFR >91.47 mL/min 89.31    Latest Reference Range & Units 02/09/24 13:35  Total CHOL/HDL Ratio  2  Cholesterol 0 - 200 mg/dL 829  HDL Cholesterol >56.21 mg/dL 30.86  LDL (calc) 0 - 99 mg/dL 72  NonHDL  57.84  Triglycerides 0.0 - 149.0 mg/dL 69.6  VLDL 0.0 - 29.5 mg/dL 28.4  VITD 13.24 - 401.02 ng/mL 33.73    Latest Reference Range & Units 02/09/24 13:35  TSH 0.35 - 5.50 uIU/mL 1.09      Latest Reference Range & Units 05/01/15 14:41  Glutamic Acid Decarb Ab < IU/mL >250 (H)   ASSESSMENT / PLAN / RECOMMENDATIONS:   1) Type 1 Diabetes Mellitus, Sub-OPtimally controlled, With neuropathic and retinopathic complications - Most recent A1c of 7.4 %. Goal A1c < 7.0 %.    -A1c is trending down -A prescription for Contour next was sent to the pharmacy Per her request -She continues with glycemic excursions, she is not consistently taking NovoLog before the meals nor does she consistently count her carbohydrates.  For example today she had oatmeal for breakfast and  did not take any NovoLog, by the time she came to the office she had a BG reading of 65 Mg/DL, she assures me she has not taken any NovoLog today, so I will decrease her Lantus which she has self increased to 16 units while being on prednisone -I did advise the patient to use the correction scale before each meal especially when she is on prednisone rather than increasing basal insulin  -Repeat BG 115 Mg/DL  MEDICATIONS: Decrease Lantus 14 units daily Continue Novolog 1:C ratio 1:15 TIDQAC and snacks  Continue correction factor: NovoLog (BG -130/40)  TID       EDUCATION / INSTRUCTIONS: BG monitoring instructions: Patient is instructed to check her blood sugars 3 times a day, before meals. Call [facility] if: BG persistently < 70 I reviewed the Rule of 15 for the treatment of hypoglycemia in detail with the patient. Literature supplied.   2) Diabetic complications:  Eye: Does  have known diabetic retinopathy.  Neuro/ Feet: Does  have known diabetic peripheral neuropathy. Renal: Patient does not have known baseline CKD. She is  on an ACEI/ARB at present.      3) Hypothyroidism:   - TSH normal 01/2024  Medication Levothyroxine 100 mcg daily   4) Peripheral Neuropathy:  - No change    Medication  Lyrica 225 mg QHS  F/U in 4 months   I spent 25 minutes preparing to see the patient by review of recent labs, imaging and procedures, obtaining and reviewing separately obtained history, communicating with the patient/family or caregiver, ordering medications, tests or procedures, and documenting clinical information in the EHR including the differential Dx, treatment, and any further evaluation and other management   Signed electronically by: Lyndle Herrlich, MD  [facility]  [facility] [address] [place] [place], [place] [address] Phone: [phone] FAX: [phone]   CC: Bethanie Dicker, NP [address] [place] [address] Phone: [phone]  Fax: [phone]    Return to Endocrinology clinic as below: Future Appointments  Date Time Provider Department Center  03/14/2024  9:30 AM [facility] ANNUAL WELLNESS VISIT [facility]  07/13/2024 11:10 AM Dama Hedgepeth, Konrad Dolores, MD [facility] None  08/09/2024  9:40 AM Bethanie Dicker, NP [facility]

## 2024-03-08 NOTE — Telephone Encounter (Signed)
 error

## 2024-03-09 ENCOUNTER — Encounter: Payer: Self-pay | Admitting: Internal Medicine

## 2024-03-09 NOTE — Progress Notes (Signed)
 Patient given patient assistance medications after office visit. Lantus x 2 boxes

## 2024-03-14 ENCOUNTER — Ambulatory Visit (INDEPENDENT_AMBULATORY_CARE_PROVIDER_SITE_OTHER): Payer: Medicare Other

## 2024-03-14 VITALS — BP 132/88 | HR 69 | Ht 65.0 in | Wt 182.0 lb

## 2024-03-14 DIAGNOSIS — Z0000 Encounter for general adult medical examination without abnormal findings: Secondary | ICD-10-CM | POA: Diagnosis not present

## 2024-03-14 NOTE — Patient Instructions (Signed)
 Karla Patton , Thank you for taking time to come for your Medicare Wellness Visit. I appreciate your ongoing commitment to your health goals. Please review the following plan we discussed and let me know if I can assist you in the future.   Referrals/Orders/Follow-Ups/Clinician Recommendations: Keep up the excellent work on keeping up with your health.   This is a list of the screening recommended for you and due dates:  Health Maintenance  Topic Date Due   Eye exam for diabetics  02/07/2024   COVID-19 Vaccine (6 - 2024-25 season) 03/30/2025*   Mammogram  08/31/2024   Hemoglobin A1C  09/08/2024   Yearly kidney health urinalysis for diabetes  10/19/2024   Yearly kidney function blood test for diabetes  02/08/2025   Complete foot exam   03/08/2025   Medicare Annual Wellness Visit  03/14/2025   DTaP/Tdap/Td vaccine (2 - Td or Tdap) 11/26/2026   DEXA scan (bone density measurement)  07/24/2027   Colon Cancer Screening  10/20/2028   Pneumonia Vaccine  Completed   Flu Shot  Completed   Hepatitis C Screening  Completed   Zoster (Shingles) Vaccine  Completed   HPV Vaccine  Aged Out  *Topic was postponed. The date shown is not the original due date.    Advanced directives: (Copy Requested) Please bring a copy of your health care power of attorney and living will to the office to be added to your chart at your convenience. You can mail to [facility] [address] [place], [place] [address] or email to [email or web address]  Next Medicare Annual Wellness Visit scheduled for next year: Yes

## 2024-03-14 NOTE — Progress Notes (Signed)
 Subjective:   Karla Patton is a 73 y.o. who presents for a Medicare Wellness preventive visit.  Visit Complete: Virtual I connected with  Karla Patton on 03/14/24 by a audio enabled telemedicine application and verified that I am speaking with the correct person using two identifiers.  Patient Location: Home  Provider Location: Home Office  I discussed the limitations of evaluation and management by telemedicine. The patient expressed understanding and agreed to proceed.  Vital Signs: Because this visit was a virtual/telehealth visit, some criteria may be missing or patient reported. Any vitals not documented were not able to be obtained and vitals that have been documented are patient reported.  VideoDeclined- This patient declined Librarian, academic. Therefore the visit was completed with audio only.  Persons Participating in Visit: Patient.  AWV Questionnaire: Yes: Patient Medicare AWV questionnaire was completed by the patient on 03/13/24; I have confirmed that all information answered by patient is correct and no changes since this date.  Cardiac Risk Factors include: obesity (BMI >30kg/m2)     Objective:    Today's Vitals   03/14/24 0916  BP: 132/88  Pulse: 69  Weight: 182 lb (82.6 kg)  Height: 5\' 5"  (1.651 m)   Body mass index is 30.29 kg/m.     10/09/2023   12:25 PM 10/09/2023   10:18 AM 02/11/2023    3:36 PM 03/18/2021    3:30 PM 04/24/2020    9:22 AM 10/20/2018    7:19 AM 05/29/2017    9:25 AM  Advanced Directives  Does Patient Have a Medical Advance Directive? Yes Yes Yes No Yes Yes Yes  Type of Estate agent of [place];Living will Healthcare Power of [place];Living will Healthcare Power of [place];Living will  Healthcare Power of [place];Living will  Healthcare Power of [place];Living will  Copy of Healthcare Power of Attorney in Chart? No - copy requested  No - copy requested  No - copy requested     Would patient like information on creating a medical advance directive? No - Patient declined   No - Patient declined       Current Medications (verified) Outpatient Encounter Medications as of 03/14/2024  Medication Sig   acetaminophen (TYLENOL) 650 MG CR tablet Take 650 mg by mouth every 8 (eight) hours as needed for pain.   aspirin EC 81 MG tablet Take 81 mg by mouth daily. Takes at bedtime   benzonatate (TESSALON) 100 MG capsule Take 1 capsule (100 mg total) by mouth 3 (three) times daily as needed.   Cholecalciferol (VITAMIN D3) 250 MCG (10000 UT) capsule Take 10,000 Units by mouth daily.   Ferrous Sulfate Dried (HIGH POTENCY IRON) 65 MG TABS Take 1 tablet by mouth daily.   fluticasone (FLONASE) 50 MCG/ACT nasal spray SPRAY 2 SPRAYS INTO EACH NOSTRIL EVERY DAY   glucose blood (ACCU-CHEK GUIDE TEST) test strip USE 1 STRIP TO CHECK GLUCOSE THREE TIMES DAILY   glucose blood (CONTOUR NEXT TEST) test strip 1 each by Other route 3 (three) times daily. Use as instructed   insulin aspart (NOVOLOG FLEXPEN) 100 UNIT/ML FlexPen Inject 12 units PLUS 0-20 units by sliding scale THREE TIMES DAILY WITH MEALS  SLIDING SCALE WHILE ON STEROIDS:  For CBG 70-120: 0 units For CBG 121-150: 3 units For CBG 151-200: 4 units For CBG 201-250: 7 units For CBG 251-300: 11 units For CBG 301-350: 15 units For CBG 351-400: 20 units For CBG > 400: 25 units and call your doctor  insulin glargine (LANTUS SOLOSTAR) 100 UNIT/ML Solostar Pen Inject 12 Units into the skin daily. (Patient taking differently: Inject 14 Units into the skin daily.)   irbesartan (AVAPRO) 75 MG tablet Take 1 tablet (75 mg total) by mouth daily.   levothyroxine (SYNTHROID) 100 MCG tablet Take 1 tablet (100 mcg total) by mouth daily before breakfast.   Multiple Vitamins-Minerals (ABC PLUS SENIOR ADULTS 50+ PO) Take 1 tablet by mouth daily.   omeprazole (PRILOSEC OTC) 20 MG tablet Take 20 mg by mouth daily.   oxybutynin (DITROPAN-XL) 5 MG  24 hr tablet Take 1 tablet (5 mg total) by mouth at bedtime.   potassium chloride SA (KLOR-CON M) 20 MEQ tablet Take 1 tablet (20 mEq total) by mouth daily as needed. Take along with lasix.   pregabalin (LYRICA) 225 MG capsule Take 1 capsule by mouth at bedtime   rosuvastatin (CRESTOR) 10 MG tablet Take 1 tablet by mouth once daily   No facility-administered encounter medications on file as of 03/14/2024.    Allergies (verified) Lisinopril   History: Past Medical History:  Diagnosis Date   Allergy 11/2021   Lisinolpril-cough   Anemia    Anxiety    Arthritis    Back pain    Diabetes mellitus without complication (HCC)    type 1   Emphysema of lung (HCC)    GERD (gastroesophageal reflux disease)    Heart murmur    Hypertension associated with diabetes (HCC) 03/03/2018   Hypothyroidism    Thyroid disease    Varicose vein    Past Surgical History:  Procedure Laterality Date   ABDOMINAL HYSTERECTOMY  1994   CHOLECYSTECTOMY  09/07/2013   COLONOSCOPY  2009   normal   COLONOSCOPY WITH PROPOFOL N/A 10/20/2018   Procedure: COLONOSCOPY WITH PROPOFOL;  Surgeon: Earline Mayotte, MD;  Location: [facility] ENDOSCOPY;  Service: Endoscopy;  Laterality: N/A;   INCONTINENCE SURGERY  2003   left leg vein ligation  2015   SPINE SURGERY  03-18-2021   VARICOSE VEIN SURGERY  1990's   Dr Lemar Livings   VEIN SURGERY Left 11/17/2017   vein closure procedure/Dr Evette Cristal   Family History  Problem Relation Age of Onset   Diabetes Mother    Pancreatitis Mother    Heart disease Father    Stroke Father        x 2   Hypertension Father    Varicose Veins Father    Fibromyalgia Sister    Scoliosis Sister    Arthritis Sister    Diabetes Sister    Breast cancer Neg Hx    Social History   Socioeconomic History   Marital status: Media planner    Spouse name: Not on file   Number of children: 2   Years of education: 12   Highest education level: 12th grade  Occupational History   Occupation:  Retired  Tobacco Use   Smoking status: Never   Smokeless tobacco: Never  Vaping Use   Vaping status: Never Used  Substance and Sexual Activity   Alcohol use: Yes    Alcohol/week: 14.0 standard drinks of alcohol    Types: 14 Glasses of wine per week   Drug use: No   Sexual activity: Not Currently    Partners: Male    Birth control/protection: Surgical, None  Other Topics Concern   Not on file  Social History Narrative   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   2 children Sons [phone], [phone])   Grandchildren 3 blood  related   5 step-grandchildren    Enjoys going to beach and reading   No pets    Social Drivers of Corporate investment banker Strain: Low Risk  (03/14/2024)   Overall Financial Resource Strain (CARDIA)    Difficulty of Paying Living Expenses: Not hard at all  Food Insecurity: No Food Insecurity (03/14/2024)   Hunger Vital Sign    Worried About Running Out of Food in the Last Year: Never true    Ran Out of Food in the Last Year: Never true  Transportation Needs: No Transportation Needs (03/14/2024)   PRAPARE - Administrator, Civil Service (Medical): No    Lack of Transportation (Non-Medical): No  Physical Activity: Insufficiently Active (03/14/2024)   Exercise Vital Sign    Days of Exercise per Week: 7 days    Minutes of Exercise per Session: 20 min  Stress: Stress Concern Present (03/14/2024)   [employer] of Occupational Health - Occupational Stress Questionnaire    Feeling of Stress : To some extent  Social Connections: Socially Integrated (03/14/2024)   Social Connection and Isolation Panel [NHANES]    Frequency of Communication with Friends and Family: More than three times a week    Frequency of Social Gatherings with Friends and Family: More than three times a week    Attends Religious Services: More than 4 times per year    Active Member of [employer] or Organizations: Yes    Attends Engineer, structural: More than 4 times per  year    Marital Status: Married    Tobacco Counseling Counseling given: Yes    Clinical Intake:  Pre-visit preparation completed: Yes  Pain : No/denies pain     BMI - recorded: 30.29 Nutritional Status: BMI > 30  Obese Nutritional Risks: None Diabetes: Yes CBG done?: No (134 per finger stick)  Lab Results  Component Value Date   HGBA1C 7.4 (A) 03/08/2024   HGBA1C 7.9 (A) 11/10/2023   HGBA1C 7.3 (A) 05/14/2023     How often do you need to have someone help you when you read instructions, pamphlets, or other written materials from your doctor or pharmacy?: 1 - Never  Interpreter Needed?: No  Information entered by :: Alia T./CMA   Activities of Daily Living     03/13/2024   12:35 PM  In your present state of health, do you have any difficulty performing the following activities:  Hearing? 0  Vision? 1  Difficulty concentrating or making decisions? 0  Walking or climbing stairs? 1  Dressing or bathing? 0  Doing errands, shopping? 0  Preparing Food and eating ? N  Using the Toilet? N  In the past six months, have you accidently leaked urine? Y  Do you have problems with loss of bowel control? N  Managing your Medications? N  Managing your Finances? N  Housekeeping or managing your Housekeeping? N    Patient Care Team: Bethanie Dicker, NP as PCP - General (Nurse Practitioner) Dasher, Cliffton Asters, MD (Dermatology) Lemar Livings Merrily Pew, MD as Consulting Physician (General Surgery) [facility] Meriter, Konrad Dolores, MD as Attending Physician (Endocrinology)  Indicate any recent Medical Services you may have received from other than [facility] providers in the past year (date may be approximate).     Assessment:   This is a routine wellness examination for Karla Patton.  Hearing/Vision screen Hearing Screening - Comments:: Pt denies hearing def/ Vision Screening - Comments:: Use glasses [facility]   Goals Addressed  None    Depression Screen     03/14/2024     9:27 AM 02/09/2024    1:11 PM 09/29/2023    1:49 PM 03/12/2023   11:18 AM 02/11/2023    3:35 PM 08/28/2022   11:12 AM 05/27/2022   11:13 AM  PHQ 2/9 Scores  PHQ - 2 Score 0 1 0 0 0 0 1  PHQ- 9 Score 0 4 4 4  4 2     Fall Risk     03/14/2024    9:26 AM 03/13/2024   12:35 PM 09/29/2023    1:49 PM 08/06/2023    2:17 PM 02/11/2023    3:38 PM  Fall Risk   Falls in the past year? 1 1 1  0 0  Number falls in past yr: 0 0 0 0 0  Injury with Fall? 0 0 0 0 0  Risk for fall due to : No Fall Risks  History of fall(s) No Fall Risks No Fall Risks  Follow up Falls evaluation completed;Falls prevention discussed  Falls evaluation completed Falls evaluation completed Falls prevention discussed;Falls evaluation completed    MEDICARE RISK AT HOME:  Medicare Risk at Home Any stairs in or around the home?: Yes If so, are there any without handrails?: Yes Home free of loose throw rugs in walkways, pet beds, electrical cords, etc?: Yes Adequate lighting in your home to reduce risk of falls?: Yes Life alert?: No Use of a cane, walker or w/c?: No Grab bars in the bathroom?: Yes Shower chair or bench in shower?: Yes Elevated toilet seat or a handicapped toilet?: Yes  TIMED UP AND GO:  Was the test performed?  No  Cognitive Function: 6CIT completed        03/14/2024    9:34 AM 02/11/2023    3:48 PM 04/24/2020    9:28 AM  6CIT Screen  What Year? 0 points 0 points 0 points  What month? 0 points 0 points 0 points  What time? 0 points 0 points 0 points  Count back from 20 0 points 0 points 0 points  Months in reverse 0 points 0 points 0 points  Repeat phrase 0 points 0 points 0 points  Total Score 0 points 0 points 0 points    Immunizations Immunization History  Administered Date(s) Administered   Fluad Quad(high Dose 65+) 08/28/2022   Influenza Split 09/02/2014   Influenza, High Dose Seasonal PF 09/01/2018, 09/02/2023   Influenza,inj,Quad PF,6+ Mos 08/28/2017   Influenza-Unspecified 10/17/2016,  09/21/2019, 09/11/2020, 09/15/2021   PFIZER(Purple Top)SARS-COV-2 Vaccination 02/01/2020, 02/22/2020, 09/18/2020, 09/23/2021   PNEUMOCOCCAL CONJUGATE-20 12/29/2022   Pfizer(Comirnaty)Fall Seasonal Vaccine 12 years and older 09/02/2023   Pneumococcal Conjugate-13 02/01/2014   Pneumococcal Polysaccharide-23 09/02/2014, 10/19/2019   Respiratory Syncytial Virus Vaccine,Recomb Aduvanted(Arexvy) 12/09/2022   Tdap 11/26/2016   Zoster Recombinant(Shingrix) 03/10/2022, 05/18/2022   Zoster, Live 02/28/2014    Screening Tests Health Maintenance  Topic Date Due   OPHTHALMOLOGY EXAM  02/07/2024   COVID-19 Vaccine (6 - 2024-25 season) 03/30/2025 (Originally 03/01/2024)   MAMMOGRAM  08/31/2024   HEMOGLOBIN A1C  09/08/2024   Diabetic kidney evaluation - Urine ACR  10/19/2024   Diabetic kidney evaluation - eGFR measurement  02/08/2025   FOOT EXAM  03/08/2025   Medicare Annual Wellness (AWV)  03/14/2025   DTaP/Tdap/Td (2 - Td or Tdap) 11/26/2026   DEXA SCAN  07/24/2027   Colonoscopy  10/20/2028   Pneumonia Vaccine 39+ Years old  Completed   INFLUENZA VACCINE  Completed   Hepatitis  C Screening  Completed   Zoster Vaccines- Shingrix  Completed   HPV VACCINES  Aged Out    Health Maintenance  Health Maintenance Due  Topic Date Due   OPHTHALMOLOGY EXAM  02/07/2024   Health Maintenance Items Addressed: N/a  Additional Screening:  Vision Screening: Recommended annual ophthalmology exams for early detection of glaucoma and other disorders of the eye.  Dental Screening: Recommended annual dental exams for proper oral hygiene  Community Resource Referral / Chronic Care Management: CRR required this visit?  No   CCM required this visit?  No     Plan:     I have personally reviewed and noted the following in the patient's chart:   Medical and social history Use of alcohol, tobacco or illicit drugs  Current medications and supplements including opioid prescriptions. Patient is not  currently taking opioid prescriptions. Functional ability and status Nutritional status Physical activity Advanced directives List of other physicians Hospitalizations, surgeries, and ER visits in previous 12 months Vitals Screenings to include cognitive, depression, and falls Referrals and appointments  In addition, I have reviewed and discussed with patient certain preventive protocols, quality metrics, and best practice recommendations. A written personalized care plan for preventive services as well as general preventive health recommendations were provided to patient.     Arta Silence, CMA   03/14/2024   After Visit Summary: (MyChart) Due to this being a telephonic visit, the after visit summary with patients personalized plan was offered to patient via MyChart   Notes: Nothing significant to report at this time.

## 2024-03-15 ENCOUNTER — Encounter: Payer: Self-pay | Admitting: Internal Medicine

## 2024-03-16 ENCOUNTER — Other Ambulatory Visit: Payer: Self-pay | Admitting: Internal Medicine

## 2024-03-17 ENCOUNTER — Other Ambulatory Visit: Payer: Self-pay

## 2024-03-17 MED ORDER — ACCU-CHEK GUIDE TEST VI STRP: ORAL_STRIP | 12 refills | Status: DC

## 2024-04-04 ENCOUNTER — Other Ambulatory Visit: Payer: Self-pay | Admitting: Internal Medicine

## 2024-04-06 ENCOUNTER — Telehealth: Payer: Self-pay

## 2024-04-06 NOTE — Telephone Encounter (Signed)
 Called and left detailed vm to CB in regards to prescription request we received from [facility] for :    rosuvastatin (CRESTOR) 10 MG   oxybutynin (DITROPAN-XL) 5 MG   irbesartan (AVAPRO) 75 MG     All 3 meds were sent to [id number] - [place] ([place]), [place] - [address]    Wanted to confirm with pt if she wanted the meds sent to [facility] or if she will continue to use [facility]

## 2024-04-08 ENCOUNTER — Other Ambulatory Visit: Payer: Self-pay | Admitting: Internal Medicine

## 2024-04-09 ENCOUNTER — Other Ambulatory Visit: Payer: Self-pay | Admitting: Nurse Practitioner

## 2024-04-09 DIAGNOSIS — J40 Bronchitis, not specified as acute or chronic: Secondary | ICD-10-CM

## 2024-04-11 ENCOUNTER — Telehealth: Payer: Self-pay

## 2024-04-11 NOTE — Telephone Encounter (Signed)
 Copied from CRM [id number]. Topic: Clinical - Medication Question >> Apr 11, 2024  2:33 PM Karla Patton wrote: Reason for CRM: Patient is calling in regards to a missed call last week regarding the preferred pharmacy for her medications. Patient stated that she only wants to continue using the [facility] pharmacy on [address]. I informed the patient that the 3 medications were sent to the correct pharmacy and I will notify the office for future information.

## 2024-04-12 ENCOUNTER — Other Ambulatory Visit: Payer: Self-pay

## 2024-04-12 DIAGNOSIS — M720 Palmar fascial fibromatosis [Dupuytren]: Secondary | ICD-10-CM | POA: Diagnosis not present

## 2024-04-12 DIAGNOSIS — M65331 Trigger finger, right middle finger: Secondary | ICD-10-CM | POA: Diagnosis not present

## 2024-04-15 ENCOUNTER — Other Ambulatory Visit: Payer: Self-pay

## 2024-04-15 ENCOUNTER — Other Ambulatory Visit: Payer: Self-pay | Admitting: Nurse Practitioner

## 2024-04-15 DIAGNOSIS — R32 Unspecified urinary incontinence: Secondary | ICD-10-CM

## 2024-04-15 DIAGNOSIS — E785 Hyperlipidemia, unspecified: Secondary | ICD-10-CM

## 2024-04-15 DIAGNOSIS — E039 Hypothyroidism, unspecified: Secondary | ICD-10-CM

## 2024-04-15 DIAGNOSIS — E876 Hypokalemia: Secondary | ICD-10-CM

## 2024-04-15 MED ORDER — LEVOTHYROXINE SODIUM 100 MCG PO TABS: 100.0000 ug | ORAL_TABLET | Freq: Every day | ORAL | 3 refills | Status: DC

## 2024-04-15 NOTE — Telephone Encounter (Signed)
 FYI- see new pharmacy.

## 2024-04-15 NOTE — Telephone Encounter (Signed)
 Copied from CRM [id number]. Topic: Clinical - Medication Refill >> Apr 15, 2024  8:57 AM Jenice Mitts wrote: Most Recent Primary Care Visit:  Provider: Michaelle Adolphus  Department: [facility]  Visit Type: MEDICARE AWV, SEQUENTIAL  Date: 03/14/2024  Medication: rosuvastatin  (CRESTOR ) 10 MG tablet, irbesartan  (AVAPRO ) 75 MG tablet,oxybutynin  (DITROPAN -XL) 5 MG 24 hr tablet,     Has the patient contacted their pharmacy? Yes (Agent: If no, request that the patient contact the pharmacy for the refill. If patient does not wish to contact the pharmacy document the reason why and proceed with request.) (Agent: If yes, when and what did the pharmacy advise?)  Is this the correct pharmacy for this prescription? Yes If no, delete pharmacy and type the correct one.  This is the patient's preferred pharmacy:  [facility] [address] ([address]), [place] - [address] [place]) [place] [address] Phone: [phone] Fax: [phone]   Has the prescription been filled recently? No  Is the patient out of the medication? No  Has the patient been seen for an appointment in the last year OR does the patient have an upcoming appointment? Yes  Can we respond through MyChart? Yes  Agent: Please be advised that Rx refills may take up to 3 business days. We ask that you follow-up with your pharmacy.

## 2024-04-18 MED ORDER — OXYBUTYNIN CHLORIDE ER 5 MG PO TB24: 5.0000 mg | ORAL_TABLET | Freq: Every day | ORAL | 1 refills | Status: DC

## 2024-04-18 MED ORDER — IRBESARTAN 75 MG PO TABS: 75.0000 mg | ORAL_TABLET | Freq: Every day | ORAL | 1 refills | Status: AC

## 2024-04-18 MED ORDER — ROSUVASTATIN CALCIUM 10 MG PO TABS: 10.0000 mg | ORAL_TABLET | Freq: Every day | ORAL | 1 refills | Status: AC

## 2024-04-19 ENCOUNTER — Encounter: Payer: Self-pay | Admitting: Internal Medicine

## 2024-04-19 MED ORDER — POTASSIUM CHLORIDE CRYS ER 20 MEQ PO TBCR: 20.0000 meq | EXTENDED_RELEASE_TABLET | Freq: Every day | ORAL | 0 refills | Status: DC | PRN

## 2024-04-22 ENCOUNTER — Ambulatory Visit
Admission: EM | Admit: 2024-04-22 | Discharge: 2024-04-22 | Disposition: A | Discharge status: Home / Self Care | Attending: Emergency Medicine | Admitting: Emergency Medicine

## 2024-04-22 ENCOUNTER — Encounter: Payer: Self-pay | Admitting: Nurse Practitioner

## 2024-04-22 ENCOUNTER — Encounter: Payer: Self-pay | Admitting: Emergency Medicine

## 2024-04-22 DIAGNOSIS — N390 Urinary tract infection, site not specified: Secondary | ICD-10-CM | POA: Insufficient documentation

## 2024-04-22 DIAGNOSIS — R739 Hyperglycemia, unspecified: Secondary | ICD-10-CM | POA: Insufficient documentation

## 2024-04-22 LAB — URINALYSIS, W/ REFLEX TO CULTURE (INFECTION SUSPECTED)
Bilirubin Urine: NEGATIVE
Glucose, UA: 500 mg/dL — AB
Ketones, ur: 15 mg/dL — AB
Nitrite: NEGATIVE
Protein, ur: >300 mg/dL — AB
Specific Gravity, Urine: 1.015 (ref 1.005–1.030)
WBC, UA: >50 WBC/hpf (ref 0–5)
pH: 5.5 (ref 5.0–8.0)

## 2024-04-22 LAB — GLUCOSE, CAPILLARY: Glucose-Capillary: 391 mg/dL — ABNORMAL HIGH (ref 70–99)

## 2024-04-22 MED ORDER — CEFDINIR 300 MG PO CAPS: 300.0000 mg | ORAL_CAPSULE | Freq: Two times a day (BID) | ORAL | 0 refills | Status: AC

## 2024-04-22 MED ORDER — PHENAZOPYRIDINE HCL 200 MG PO TABS: 200.0000 mg | ORAL_TABLET | Freq: Three times a day (TID) | ORAL | 0 refills | Status: DC

## 2024-04-22 NOTE — ED Provider Notes (Signed)
 [facility]    CSN: [id number] Arrival date & time: 04/22/24  1832      History   Chief Complaint Chief Complaint  Patient presents with   Hyperglycemia    HPI Karla Patton is a 73 y.o. female.   HPI  73 year old female with past medical history admitted for type 1 diabetes, hypothyroidism, diabetic retinopathy, hyperlipidemia, hypertension, GERD, and heart murmur presents for evaluation of elevated blood sugar in the 400s that has been going on for the last 3 days.  The patient reports that 3 days ago she laid out in the yard and some sort of bite to her right knee.  Later in the evening she started to develop nausea and dizziness at around bedtime.  She has had decreased appetite, off-and-on headache, and nausea since Tuesday afternoon.  She also states that she just feels weak.  Per her husband's report she has had 3 separate falls in the last 3 days but has not struck her head.  She denies any body aches or rash.  She did take 6 units of sliding scale insulin  today because she did eat.  She has had some urinary urgency and has noticed that her urine has been cloudy but she denies any pain with urination or blood in her urine.  Past Medical History:  Diagnosis Date   Allergy 11/2021   Lisinolpril-cough   Anemia    Anxiety    Arthritis    Back pain    Diabetes mellitus without complication (HCC)    type 1   Emphysema of lung (HCC)    GERD (gastroesophageal reflux disease)    Heart murmur    Hypertension associated with diabetes (HCC) 03/03/2018   Hypothyroidism    Thyroid  disease    Varicose vein     Patient Active Problem List   Diagnosis Date Noted   Numbness of hand 03/08/2024   Bronchitis 02/16/2024   Urinary incontinence 02/09/2024   Type 1 diabetes mellitus with diabetic polyneuropathy (HCC) 11/10/2023   H/O spinal fusion 10/28/2023   Adjacent segment disease of lumbar spine with history of fusion procedure 10/28/2023   Acute lumbar  radiculopathy 10/12/2023   Hyponatremia 10/09/2023   Acute pain of right thigh 10/09/2023   Dizziness 09/29/2023   Acquired trigger finger of right middle finger 09/22/2023   Carpal tunnel syndrome of right wrist 09/22/2023   Dupuytren's disease of palm 09/22/2023   Right upper quadrant abdominal pain 08/06/2023   Bilateral hand pain 07/02/2023   Chronic cough 05/20/2023   OSA (obstructive sleep apnea) 03/16/2023   Other fatigue 03/12/2023   Autonomic neuropathy due to diabetes (HCC) 12/31/2022   Localized osteoarthritis of left knee 12/31/2022   Allergic rhinitis 08/28/2022   Pruritus 08/28/2022   Murmur 05/27/2022   Gastroesophageal reflux disease without esophagitis 05/27/2022   Spinal stenosis of lumbar region 05/22/2022   Chronic lumbar pain 05/22/2022   Other fracture of upper and lower end of right fibula, initial encounter for closed fracture 09/04/2021   Varicose veins of lower extremity with edema, bilateral 08/23/2021   Venous insufficiency of both lower extremities 08/23/2021   Hyperlipidemia due to type 1 diabetes mellitus (HCC) 06/13/2021   Obesity (BMI 30-39.9) 06/13/2021   Hypertension associated with diabetes (HCC) 03/03/2018   Osteopenia 01/13/2018   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 (HCC) 08/28/2017   Liver hemangioma 08/28/2017   Background diabetic retinopathy (HCC) 05/22/2015   Hypothyroidism 10/10/2014  Past Surgical History:  Procedure Laterality Date   ABDOMINAL HYSTERECTOMY  1994   CHOLECYSTECTOMY  09/07/2013   COLONOSCOPY  2009   normal   COLONOSCOPY WITH PROPOFOL  N/A 10/20/2018   Procedure: COLONOSCOPY WITH PROPOFOL ;  Surgeon: Marshall Skeeter, MD;  Location: [facility] END[facility];  Service: Endoscopy;  Laterality: N/A;   INCONTINENCE SURGERY  2003   left leg vein ligation  2015   SPINE SURGERY  03-18-2021   VARICOSE VEIN SURGERY  1990's   Dr Marquita Situ   VEIN SURGERY Left 11/17/2017    vein closure procedure/Dr Lorel Roes    OB History     Gravida  2   Para  2   Term  2   Preterm      AB      Living  2      SAB      IAB      Ectopic      Multiple      Live Births  1        Obstetric Comments  1st Menstrual Cycle:  14 1st Pregnancy:  24          Home Medications    Prior to Admission medications   Medication Sig Start Date End Date Taking? Authorizing Provider  acetaminophen  (TYLENOL ) 650 MG CR tablet Take 650 mg by mouth every 8 (eight) hours as needed for pain.   Yes [provider]  aspirin  EC 81 MG tablet Take 81 mg by mouth daily. Takes at bedtime   Yes [provider]  cefdinir (OMNICEF) 300 MG capsule Take 1 capsule (300 mg total) by mouth 2 (two) times daily for 7 days. 04/22/24 04/29/24 Yes Kent Pear, NP  Cholecalciferol  (VITAMIN D3) 250 MCG (10000 UT) capsule Take 10,000 Units by mouth daily.   Yes [provider]  Ferrous Sulfate  Dried (HIGH POTENCY IRON ) 65 MG TABS Take 1 tablet by mouth daily.   Yes [provider]  fluticasone  (FLONASE ) 50 MCG/ACT nasal spray SPRAY 2 SPRAYS INTO EACH NOSTRIL EVERY DAY 09/25/22  Yes Drubel, Heidi Llamas, PA-C  glucose blood (ACCU-CHEK GUIDE TEST) test strip USE 1 THREE TIMES DAILY TO CHECK GLUCOSE DX E10.65 03/17/24  Yes Shamleffer, Ibtehal Jaralla, MD  glucose blood (CONTOUR NEXT TEST) test strip 1 each by Other route 3 (three) times daily. Use as instructed 03/08/24  Yes Shamleffer, Ibtehal Jaralla, MD  insulin  aspart (NOVOLOG  FLEXPEN) 100 UNIT/ML FlexPen Inject 12 units PLUS 0-20 units by sliding scale THREE TIMES DAILY WITH MEALS  SLIDING SCALE WHILE ON STEROIDS:  For CBG 70-120: 0 units For CBG 121-150: 3 units For CBG 151-200: 4 units For CBG 201-250: 7 units For CBG 251-300: 11 units For CBG 301-350: 15 units For CBG 351-400: 20 units For CBG > 400: 25 units and call your doctor 10/12/23  Yes Darus Engels A, DO  insulin  glargine (LANTUS  SOLOSTAR) 100 UNIT/ML  Solostar Pen Inject 12 Units into the skin daily. Patient taking differently: Inject 14 Units into the skin daily. 05/14/23  Yes Shamleffer, Ibtehal Jaralla, MD  irbesartan  (AVAPRO ) 75 MG tablet Take 1 tablet (75 mg total) by mouth daily. 04/18/24  Yes Bluford Burkitt, NP  levothyroxine  (SYNTHROID ) 100 MCG tablet Take 1 tablet (100 mcg total) by mouth daily before breakfast. 04/15/24  Yes Shamleffer, Ibtehal Jaralla, MD  Multiple Vitamins-Minerals (ABC PLUS SENIOR ADULTS 50+ PO) Take 1 tablet by mouth daily. 11/19/20  Yes [provider]  omeprazole  (PRILOSEC  OTC) 20 MG tablet Take 20 mg  by mouth daily.   Yes [provider]  oxybutynin  (DITROPAN -XL) 5 MG 24 hr tablet Take 1 tablet (5 mg total) by mouth at bedtime. 04/18/24  Yes Bluford Burkitt, NP  phenazopyridine (PYRIDIUM) 200 MG tablet Take 1 tablet (200 mg total) by mouth 3 (three) times daily. 04/22/24  Yes Kent Pear, NP  potassium chloride  SA (KLOR-CON  M) 20 MEQ tablet Take 1 tablet (20 mEq total) by mouth daily as needed. Take along with lasix . 04/19/24  Yes Bluford Burkitt, NP  pregabalin  (LYRICA ) 225 MG capsule Take 1 capsule by mouth at bedtime 04/07/24  Yes Emilie Harden, MD  rosuvastatin  (CRESTOR ) 10 MG tablet Take 1 tablet (10 mg total) by mouth daily. 04/18/24  Yes Bluford Burkitt, NP    Family History Family History  Problem Relation Age of Onset   Diabetes Mother    Pancreatitis Mother    Heart disease Father    Stroke Father        x 2   Hypertension Father    Varicose Veins Father    Fibromyalgia Sister    Scoliosis Sister    Arthritis Sister    Diabetes Sister    Breast cancer Neg Hx     Social History Social History   Tobacco Use   Smoking status: Never   Smokeless tobacco: Never  Vaping Use   Vaping status: Never Used  Substance Use Topics   Alcohol use: Yes    Alcohol/week: 14.0 standard drinks of alcohol    Types: 14 Glasses of wine per week   Drug use: No     Allergies    Lisinopril    Review of Systems Review of Systems  Constitutional:  Positive for appetite change, fatigue and fever.  Gastrointestinal:  Positive for nausea.  Genitourinary:  Positive for frequency and urgency. Negative for dysuria and hematuria.  Musculoskeletal:  Negative for arthralgias and myalgias.  Skin:  Negative for rash.  Neurological:  Positive for dizziness and headaches.     Physical Exam Triage Vital Signs ED Triage Vitals  Encounter Vitals Group     BP      Systolic BP Percentile      Diastolic BP Percentile      Pulse      Resp      Temp      Temp src      SpO2      Weight      Height      Head Circumference      Peak Flow      Pain Score      Pain Loc      Pain Education      Exclude from Growth Chart    No data found.  Updated Vital Signs BP 118/70 (BP Location: Left Arm)   Pulse 96   Temp 98.3 F (36.8 C) (Oral)   Ht 5\' 5"  (1.651 m)   Wt 175 lb (79.4 kg)   SpO2 93%   BMI 29.12 kg/m   Visual Acuity Right Eye Distance:   Left Eye Distance:   Bilateral Distance:    Right Eye Near:   Left Eye Near:    Bilateral Near:     Physical Exam Vitals and nursing note reviewed.  Constitutional:      Appearance: Normal appearance. She is not ill-appearing.  HENT:     Head: Normocephalic and atraumatic.  Eyes:     Pupils: Pupils are equal, round, and reactive to light.  Cardiovascular:  Rate and Rhythm: Normal rate and regular rhythm.     Pulses: Normal pulses.     Heart sounds: Normal heart sounds. No murmur heard.    No friction rub. No gallop.  Pulmonary:     Effort: Pulmonary effort is normal.     Breath sounds: Normal breath sounds. No wheezing, rhonchi or rales.  Skin:    General: Skin is warm and dry.     Capillary Refill: Capillary refill takes less than 2 seconds.     Findings: Erythema and lesion present.  Neurological:     Mental Status: She is alert and oriented to person, place, and time.      UC Treatments /  Results  Labs (all labs ordered are listed, but only abnormal results are displayed) Labs Reviewed  URINALYSIS, W/ REFLEX TO CULTURE (INFECTION SUSPECTED) - Abnormal; Notable for the following components:      Result Value   APPearance HAZY (*)    Glucose, UA 500 (*)    Hgb urine dipstick MODERATE (*)    Ketones, ur 15 (*)    Protein, ur >300 (*)    Leukocytes,Ua SMALL (*)    Bacteria, UA MANY (*)    All other components within normal limits  GLUCOSE, CAPILLARY - Abnormal; Notable for the following components:   Glucose-Capillary 391 (*)    All other components within normal limits  URINE CULTURE  CBG MONITORING, ED    EKG   Radiology No results found.  Procedures Procedures (including critical care time)  Medications Ordered in UC Medications - No data to display  Initial Impression / Assessment and Plan / UC Course  I have reviewed the triage vital signs and the nursing notes.  Pertinent labs & imaging results that were available during my care of the patient were reviewed by me and considered in my medical decision making (see chart for details).   Patient is a pleasant 73 year old female presenting for evaluation of elevated blood sugar and feeling poorly as outlined HPI above.  She reports that her symptoms began after she laid out in the yard and received some sort of bite to her right knee.  As you can see the above, there is an erythematous mark that is flat, nontender, nonindurated, and nonfluctuant.  It is blanchable and nontender to touch.  It is also not warm.  Following this bite is when her symptoms developed.  She does report endorse a subjective fever today, ongoing nausea, dizziness, and headache.  Her dizziness and headache have currently resolved and she just feels weak at present.  Patient's pupils equal round reactive and EOM is intact.  She is alert and oriented x 4.  Cardiopulmonary symptoms S1-S2 heart sounds with regular rate and rhythm lung sounds are  clear auscultation all fields.  She contacted her PCP who advised her to go to the emergency department because she was unable to obtain a blood sugar.  I ordered a fingerstick blood sugar here in clinic which was 391.  I will order a urinalysis to evaluate for presence of UTI given her urinary urgency and the cloudiness to her urine as this could be the cause of her symptoms.  If she is unable to provide a specimen, or if her urinalysis is negative, I will refer her to the ER for further evaluation and management.  Urinalysis has a hazy appearance with a 500 glucose, moderate hemoglobin, 15 ketones, greater than 300 protein, small leukocyte esterase but negative for nitrites.  Reflex  microscopy shows greater than 50 WBCs, 21-50 RBCs, many bacteria and WBC clumps.  Urine will reflex to culture.  I will discharge patient on the diagnosis of urinary tract infection and hyperglycemia and start her on cefdinir 300 mg twice daily for 7 days.  I will also have her increase her oral fluid intake so that she increases your abduction to help flush her urinary tract.  Final Clinical Impressions(s) / UC Diagnoses   Final diagnoses:  Lower urinary tract infectious disease  Hyperglycemia     Discharge Instructions      Take the cefdinir twice daily for 7 days with food for treatment of urinary tract infection.  Use the Pyridium every 8 hours as needed for urinary discomfort.  This will turn your urine a bright red-orange.  Increase your oral fluid intake so that you increase your urine production and or flushing your urinary system.  Take an over-the-counter probiotic, such as Culturelle-Align-Activia, 1 hour after each dose of antibiotic to prevent diarrhea or yeast infections from forming.  We will culture urine and change the antibiotics if necessary.  Return for reevaluation, or see your primary care provider, for any new or worsening symptoms.      ED Prescriptions     Medication Sig  Dispense Auth. Provider   cefdinir (OMNICEF) 300 MG capsule Take 1 capsule (300 mg total) by mouth 2 (two) times daily for 7 days. 14 capsule Kent Pear, NP   phenazopyridine (PYRIDIUM) 200 MG tablet Take 1 tablet (200 mg total) by mouth 3 (three) times daily. 6 tablet Kent Pear, NP      PDMP not reviewed this encounter.   Kent Pear, NP 04/22/24 2010

## 2024-04-22 NOTE — ED Triage Notes (Signed)
 Pt c/o Elevated blood sugar in the 400s starting on Tuesday.  Pt states that she has been taking her medication.  Pt denies any changes to her medication.

## 2024-04-22 NOTE — Discharge Instructions (Addendum)
 Take the cefdinir twice daily for 7 days with food for treatment of urinary tract infection.  Use the Pyridium every 8 hours as needed for urinary discomfort.  This will turn your urine a bright red-orange.  Increase your oral fluid intake so that you increase your urine production and or flushing your urinary system.  Take an over-the-counter probiotic, such as Culturelle-Align-Activia, 1 hour after each dose of antibiotic to prevent diarrhea or yeast infections from forming.  We will culture urine and change the antibiotics if necessary.  Return for reevaluation, or see your primary care provider, for any new or worsening symptoms.

## 2024-04-22 NOTE — Telephone Encounter (Signed)
 Pt needs to get evaluated at [facility] and ED.

## 2024-04-22 NOTE — Telephone Encounter (Signed)
 Lvm for pt to give office a call back. When pt calls back please triage pt.

## 2024-04-25 LAB — URINE CULTURE: Culture: >=100000 — AB

## 2024-04-25 NOTE — Telephone Encounter (Signed)
Pt was seen at urgent care. 

## 2024-04-28 ENCOUNTER — Encounter: Payer: Self-pay | Admitting: Nurse Practitioner

## 2024-04-28 ENCOUNTER — Ambulatory Visit (INDEPENDENT_AMBULATORY_CARE_PROVIDER_SITE_OTHER): Admitting: Nurse Practitioner

## 2024-04-28 VITALS — BP 100/54 | HR 86 | Temp 98.1°F | Ht 65.0 in | Wt 177.2 lb

## 2024-04-28 DIAGNOSIS — R300 Dysuria: Secondary | ICD-10-CM

## 2024-04-28 DIAGNOSIS — N3000 Acute cystitis without hematuria: Secondary | ICD-10-CM | POA: Insufficient documentation

## 2024-04-28 DIAGNOSIS — E103293 Type 1 diabetes mellitus with mild nonproliferative diabetic retinopathy without macular edema, bilateral: Secondary | ICD-10-CM | POA: Diagnosis not present

## 2024-04-28 LAB — POC URINALSYSI DIPSTICK (AUTOMATED)
Bilirubin, UA: NEGATIVE
Glucose, UA: NEGATIVE
Ketones, UA: NEGATIVE
Nitrite, UA: POSITIVE — AB
Protein, UA: POSITIVE — AB
Spec Grav, UA: <=1.005 — AB (ref 1.010–1.025)
Urobilinogen, UA: 0.2 U/dL
pH, UA: 5.5 (ref 5.0–8.0)

## 2024-04-28 MED ORDER — CIPROFLOXACIN HCL 500 MG PO TABS: 500.0000 mg | ORAL_TABLET | Freq: Two times a day (BID) | ORAL | 0 refills | Status: AC

## 2024-04-28 NOTE — Progress Notes (Signed)
 Bluford Burkitt, NP-C Phone: [phone]  Karla Patton is a 73 y.o. female who presents today for UTI follow up.   Discussed the use of AI scribe software for clinical note transcription with the patient, who gave verbal consent to proceed.  History of Present Illness   Karla Patton is a 73 year old female who presents with persistent urinary tract infection symptoms despite antibiotic treatment.  She was diagnosed with a urinary tract infection on Apr 22, 2024, and was started on cefdinir twice a day. Despite completing nearly the full course of antibiotics, her symptoms have not significantly improved. She describes persistent urinary incontinence, particularly in the morning. No increased frequency, dysuria, hematuria, abdominal pain, or fever. Her urine remains very golden and not clear, despite adequate fluid intake.  She reports feeling lethargic and mentally foggy, spending much of last week in bed. She experienced dizziness, particularly last week when her blood pressure was low, and her blood sugar was elevated, reaching around 400. Denies any symptoms today. Her blood sugar has since improved, currently at 230 after eating yogurt. She finds it challenging to remember to take insulin  before meals.  Her past surgical history includes a bladder mesh procedure. She sometimes needs to rock to empty her bladder completely. She has not seen a urologist or a female doctor recently. She is trying to incorporate more probiotics into her diet.  Her current medications include cefdinir, which she has been taking regularly, and insulin  for her diabetes. She is also trying to consume more yogurt for probiotics.      Social History   Tobacco Use  Smoking Status Never  Smokeless Tobacco Never    Current Outpatient Medications on File Prior to Visit  Medication Sig Dispense Refill   acetaminophen  (TYLENOL ) 650 MG CR tablet Take 650 mg by mouth every 8 (eight) hours as needed for pain.      aspirin  EC 81 MG tablet Take 81 mg by mouth daily. Takes at bedtime     cefdinir (OMNICEF) 300 MG capsule Take 1 capsule (300 mg total) by mouth 2 (two) times daily for 7 days. 14 capsule 0   Cholecalciferol  (VITAMIN D3) 250 MCG (10000 UT) capsule Take 10,000 Units by mouth daily.     Ferrous Sulfate  Dried (HIGH POTENCY IRON ) 65 MG TABS Take 1 tablet by mouth daily.     fluticasone  (FLONASE ) 50 MCG/ACT nasal spray SPRAY 2 SPRAYS INTO EACH NOSTRIL EVERY DAY 48 mL 1   glucose blood (ACCU-CHEK GUIDE TEST) test strip USE 1 THREE TIMES DAILY TO CHECK GLUCOSE DX E10.65 100 each 12   insulin  aspart (NOVOLOG  FLEXPEN) 100 UNIT/ML FlexPen Inject 12 units PLUS 0-20 units by sliding scale THREE TIMES DAILY WITH MEALS  SLIDING SCALE WHILE ON STEROIDS:  For CBG 70-120: 0 units For CBG 121-150: 3 units For CBG 151-200: 4 units For CBG 201-250: 7 units For CBG 251-300: 11 units For CBG 301-350: 15 units For CBG 351-400: 20 units For CBG > 400: 25 units and call your doctor     insulin  glargine (LANTUS  SOLOSTAR) 100 UNIT/ML Solostar Pen Inject 12 Units into the skin daily. (Patient taking differently: Inject 14 Units into the skin daily.) 15 mL 4   irbesartan  (AVAPRO ) 75 MG tablet Take 1 tablet (75 mg total) by mouth daily. 90 tablet 1   levothyroxine  (SYNTHROID ) 100 MCG tablet Take 1 tablet (100 mcg total) by mouth daily before breakfast. 90 tablet 3   Multiple Vitamins-Minerals (ABC PLUS SENIOR  ADULTS 50+ PO) Take 1 tablet by mouth daily.     omeprazole  (PRILOSEC  OTC) 20 MG tablet Take 20 mg by mouth daily.     oxybutynin  (DITROPAN -XL) 5 MG 24 hr tablet Take 1 tablet (5 mg total) by mouth at bedtime. 90 tablet 1   phenazopyridine (PYRIDIUM) 200 MG tablet Take 1 tablet (200 mg total) by mouth 3 (three) times daily. 6 tablet 0   potassium chloride  SA (KLOR-CON  M) 20 MEQ tablet Take 1 tablet (20 mEq total) by mouth daily as needed. Take along with lasix . 30 tablet 0   pregabalin  (LYRICA ) 225 MG capsule  Take 1 capsule by mouth at bedtime 90 capsule 0   rosuvastatin  (CRESTOR ) 10 MG tablet Take 1 tablet (10 mg total) by mouth daily. 90 tablet 1   No current facility-administered medications on file prior to visit.     ROS see history of present illness  Objective  Physical Exam Vitals:   04/28/24 0949  BP: (!) 100/54  Pulse: 86  Temp: 98.1 F (36.7 C)  SpO2: 96%    BP Readings from Last 3 Encounters:  04/28/24 (!) 100/54  04/22/24 118/70  03/14/24 132/88   Wt Readings from Last 3 Encounters:  04/28/24 177 lb 3.2 oz (80.4 kg)  04/22/24 175 lb (79.4 kg)  03/14/24 182 lb (82.6 kg)    Physical Exam Constitutional:      General: She is not in acute distress.    Appearance: Normal appearance.  HENT:     Head: Normocephalic.  Cardiovascular:     Rate and Rhythm: Normal rate and regular rhythm.     Heart sounds: Normal heart sounds.  Pulmonary:     Effort: Pulmonary effort is normal.     Breath sounds: Normal breath sounds.  Abdominal:     General: Abdomen is flat. Bowel sounds are normal.     Palpations: Abdomen is soft.     Tenderness: There is no abdominal tenderness.  Skin:    General: Skin is warm and dry.  Neurological:     General: No focal deficit present.     Mental Status: She is alert.  Psychiatric:        Mood and Affect: Mood normal.        Behavior: Behavior normal.      Assessment/Plan: Please see individual problem list.  Acute cystitis without hematuria Assessment & Plan: Persistent UTI with E. coli, as cefdinir was ineffective. Symptoms and urinalysis confirm ongoing infection. Afebrile. BP low, asymptomatic. Discontinue cefdinir and start ciprofloxacin twice daily for 3 days. Monitor for side effects such as diarrhea. Encourage hydration and regular bladder emptying. Educated on Nurse, mental health, including wiping front to back. Consider urology referral if UTIs become recurrent. Return precautions given to patient.   Orders: -      Ciprofloxacin HCl; Take 1 tablet (500 mg total) by mouth 2 (two) times daily for 3 days.  Dispense: 6 tablet; Refill: 0  Dysuria -     POCT Urinalysis Dipstick (Automated)  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 (HCC) Assessment & Plan: Blood glucose elevated likely due to UTI, improved to 230 mg/dL. Continue monitoring blood glucose levels. Administer insulin  as advised by the endocrinologist, ensuring it is taken before meals.      Return if symptoms worsen or fail to improve.   Bluford Burkitt, NP-C [facility]: [facility] [address]

## 2024-04-28 NOTE — Assessment & Plan Note (Addendum)
 Persistent UTI with E. coli, as cefdinir was ineffective. Symptoms and urinalysis confirm ongoing infection. Afebrile. BP low, asymptomatic. Discontinue cefdinir and start ciprofloxacin twice daily for 3 days. Monitor for side effects such as diarrhea. Encourage hydration and regular bladder emptying. Educated on Nurse, mental health, including wiping front to back. Consider urology referral if UTIs become recurrent. Return precautions given to patient.

## 2024-04-28 NOTE — Assessment & Plan Note (Signed)
 Blood glucose elevated likely due to UTI, improved to 230 mg/dL. Continue monitoring blood glucose levels. Administer insulin  as advised by the endocrinologist, ensuring it is taken before meals.

## 2024-05-02 ENCOUNTER — Encounter: Payer: Self-pay | Admitting: Nurse Practitioner

## 2024-05-02 ENCOUNTER — Other Ambulatory Visit: Payer: Self-pay

## 2024-05-02 DIAGNOSIS — R300 Dysuria: Secondary | ICD-10-CM

## 2024-05-03 ENCOUNTER — Encounter: Payer: Self-pay | Admitting: Internal Medicine

## 2024-05-03 ENCOUNTER — Other Ambulatory Visit

## 2024-05-03 DIAGNOSIS — R300 Dysuria: Secondary | ICD-10-CM

## 2024-05-04 LAB — URINE CULTURE
MICRO NUMBER:: 16448901
Result:: NO GROWTH
SPECIMEN QUALITY:: ADEQUATE

## 2024-05-06 ENCOUNTER — Ambulatory Visit: Payer: Self-pay | Admitting: Family

## 2024-06-17 ENCOUNTER — Telehealth: Payer: Self-pay

## 2024-06-17 NOTE — Telephone Encounter (Signed)
 Called and informed pt that her patient assistance meds were ready for pick up    MED: Novolog  FlexPen 100 units/mL QTY: 6 boxes LOT: [id number] EXP: 07-21-26  4 boxes of novofine needles

## 2024-06-17 NOTE — Telephone Encounter (Signed)
 Pt has been informed.

## 2024-06-21 DIAGNOSIS — M720 Palmar fascial fibromatosis [Dupuytren]: Secondary | ICD-10-CM | POA: Diagnosis not present

## 2024-06-21 DIAGNOSIS — M65331 Trigger finger, right middle finger: Secondary | ICD-10-CM | POA: Diagnosis not present

## 2024-06-21 DIAGNOSIS — Z01818 Encounter for other preprocedural examination: Secondary | ICD-10-CM | POA: Diagnosis not present

## 2024-07-01 DIAGNOSIS — E10641 Type 1 diabetes mellitus with hypoglycemia with coma: Secondary | ICD-10-CM | POA: Diagnosis not present

## 2024-07-01 DIAGNOSIS — M65351 Trigger finger, right little finger: Secondary | ICD-10-CM | POA: Diagnosis not present

## 2024-07-01 DIAGNOSIS — M720 Palmar fascial fibromatosis [Dupuytren]: Secondary | ICD-10-CM | POA: Diagnosis not present

## 2024-07-01 DIAGNOSIS — Z794 Long term (current) use of insulin: Secondary | ICD-10-CM | POA: Diagnosis not present

## 2024-07-01 DIAGNOSIS — E669 Obesity, unspecified: Secondary | ICD-10-CM | POA: Diagnosis not present

## 2024-07-01 DIAGNOSIS — M65331 Trigger finger, right middle finger: Secondary | ICD-10-CM | POA: Diagnosis not present

## 2024-07-01 DIAGNOSIS — M19041 Primary osteoarthritis, right hand: Secondary | ICD-10-CM | POA: Diagnosis not present

## 2024-07-01 DIAGNOSIS — G8918 Other acute postprocedural pain: Secondary | ICD-10-CM | POA: Diagnosis not present

## 2024-07-01 DIAGNOSIS — M65841 Other synovitis and tenosynovitis, right hand: Secondary | ICD-10-CM | POA: Diagnosis not present

## 2024-07-01 DIAGNOSIS — Z6829 Body mass index (BMI) 29.0-29.9, adult: Secondary | ICD-10-CM | POA: Diagnosis not present

## 2024-07-01 DIAGNOSIS — Z7982 Long term (current) use of aspirin: Secondary | ICD-10-CM | POA: Diagnosis not present

## 2024-07-08 DIAGNOSIS — M6530 Trigger finger, unspecified finger: Secondary | ICD-10-CM | POA: Diagnosis not present

## 2024-07-08 DIAGNOSIS — M720 Palmar fascial fibromatosis [Dupuytren]: Secondary | ICD-10-CM | POA: Diagnosis not present

## 2024-07-12 ENCOUNTER — Other Ambulatory Visit: Payer: Self-pay

## 2024-07-12 DIAGNOSIS — R32 Unspecified urinary incontinence: Secondary | ICD-10-CM

## 2024-07-12 MED ORDER — OXYBUTYNIN CHLORIDE ER 5 MG PO TB24: 5.0000 mg | ORAL_TABLET | Freq: Every day | ORAL | 1 refills | Status: DC

## 2024-07-13 ENCOUNTER — Ambulatory Visit: Admitting: Internal Medicine

## 2024-07-13 ENCOUNTER — Encounter: Payer: Self-pay | Admitting: Internal Medicine

## 2024-07-13 VITALS — BP 120/78 | HR 75 | Ht 65.0 in | Wt 175.0 lb

## 2024-07-13 DIAGNOSIS — E1065 Type 1 diabetes mellitus with hyperglycemia: Secondary | ICD-10-CM

## 2024-07-13 DIAGNOSIS — E1042 Type 1 diabetes mellitus with diabetic polyneuropathy: Secondary | ICD-10-CM

## 2024-07-13 DIAGNOSIS — E103299 Type 1 diabetes mellitus with mild nonproliferative diabetic retinopathy without macular edema, unspecified eye: Secondary | ICD-10-CM | POA: Diagnosis not present

## 2024-07-13 DIAGNOSIS — E039 Hypothyroidism, unspecified: Secondary | ICD-10-CM

## 2024-07-13 LAB — POCT GLYCOSYLATED HEMOGLOBIN (HGB A1C): Hemoglobin A1C: 7.6 % — AB (ref 4.0–5.6)

## 2024-07-13 MED ORDER — OMNIPOD 5 G7 PODS (GEN 5) MISC
1.0000 | 3 refills | Status: DC
Start: 2024-07-13 — End: 2024-11-14

## 2024-07-13 MED ORDER — CONTOUR NEXT TEST VI STRP: 1.0000 | ORAL_STRIP | Freq: Three times a day (TID) | 12 refills | Status: DC

## 2024-07-13 NOTE — Patient Instructions (Addendum)
 Continue Lantus 14 units daily  Novolog one unit for every 14 grams of carbohydrates with meals and snacks  Novolog correctional insulin: ADD extra units on insulin to your meal-time Novolog dose if your blood sugars are higher than 170. Use the scale below to help guide you:   Blood sugar before meal Number of units to inject  Less than 170 0 unit  171 -  210 1 units  211 -  250 2 units  251 -  290 3 units  291 -  330 4 units  331 -  370 5 units  371 -  410 6 units     HOW TO TREAT LOW BLOOD SUGARS (Blood sugar LESS THAN 70 MG/DL) Please follow the RULE OF 15 for the treatment of hypoglycemia treatment (when your (blood sugars are less than 70 mg/dL)   STEP 1: Take 15 grams of carbohydrates when your blood sugar is low, which includes:  3-4 GLUCOSE TABS  OR 3-4 OZ OF JUICE OR REGULAR SODA OR ONE TUBE OF GLUCOSE GEL    STEP 2: RECHECK blood sugar in 15 MINUTES STEP 3: If your blood sugar is still low at the 15 minute recheck --> then, go back to STEP 1 and treat AGAIN with another 15 grams of carbohydrates.

## 2024-07-13 NOTE — Progress Notes (Unsigned)
 Name: Karla Patton  MRN/ DOB: [id number], [birth year]   Age/ Sex: 73 y.o., female    PCP: Gretel App, NP   Reason for Endocrinology Evaluation: Type 1 Diabetes Mellitus     Date of Initial Endocrinology Visit: 02/27/2022    PATIENT IDENTIFIER: Karla Patton is a 73 y.o. female with a past medical history of T1DM, Hypothyroidism, Dyslipidemia . The patient presented for initial endocrinology clinic visit on 02/27/2022 for consultative assistance with her diabetes management.    HPI: Karla Patton was    Diagnosed with DM at age 35  Prior Medications tried/Intolerance: She was on [employer] but did not like the tubing   Hemoglobin A1c has ranged from 6.8% in 2022, peaking at 8.9% in 2019. Patient required assistance for hypoglycemia: yes x2 over the years    On her initial visit to our clinic, her A1c is 7.5% she was on Lantus , and NovoLog  which we adjusted   She was started on OmniPod in September 2023 but by January 2024 she had to stop the OmniPod due to increasing cost   SUBJECTIVE:   During the last visit (03/08/2024): A1c 7.4%   Today (07/13/24): Karla Patton is here for follow-up on diabetes management.  She checks her blood sugars multiple  times daily, through CGM .The patient has had hypoglycemic episodes since the last clinic visit , she is not symptomatic with these episodes.   S/P right hand sx dupuytren's contracture ~ 2 weeks ago  No recent nausea or vomiting  Has constipation - on OTC laxatuves   She is on Lyrica  225 mg for neuropathy, has noted tingling around her ankles at night    HOME DIABETES REGIMEN: Lantus  14 units daily -patient assistance NovoLog  1:15-patient assistance CF: Novolog  (BG-130/40) TIDQAC Levothyroxine  100 mcg daily  Lyrica  225 mg  QHS    Statin: yes ACE-I/ARB: yes     CONTINUOUS GLUCOSE MONITORING RECORD INTERPRETATION    Dates of Recording:7/10-7/23/2025  Sensor description: dexcom  Results statistics:   CGM  use % of time 95  Average and SD 185/76  Time in range 50%  % Time Above 180 27  % Time above 250 21  % Time Below target < 2      Glycemic patterns summary: BG's have no pattern overnight and increased throughout the day Hyperglycemic episodes  postprandial  Hypoglycemic episodes occurred after a bolus  Overnight periods: Variable without a specific pattern      DIABETIC COMPLICATIONS: Microvascular complications:  Non proliferative DR, neuropathy  Denies: CKD  Last eye exam: Completed 02/06/2023  Macrovascular complications:   Denies: CAD, PVD, CVA   PAST HISTORY: Past Medical History:  Past Medical History:  Diagnosis Date   Allergy 11/2021   Lisinolpril-cough   Anemia    Anxiety    Arthritis    Back pain    Diabetes mellitus without complication (HCC)    type 1   Emphysema of lung (HCC)    GERD (gastroesophageal reflux disease)    Heart murmur    Hypertension associated with diabetes (HCC) 03/03/2018   Hypothyroidism    Thyroid  disease    Varicose vein    Past Surgical History:  Past Surgical History:  Procedure Laterality Date   ABDOMINAL HYSTERECTOMY  1994   CHOLECYSTECTOMY  09/07/2013   COLONOSCOPY  2009   normal   COLONOSCOPY WITH PROPOFOL  N/A 10/20/2018   Procedure: COLONOSCOPY WITH PROPOFOL ;  Surgeon: Dessa Reyes ORN, MD;  Location: [facility] END[facility]COPY;  Service: Endoscopy;  Laterality: N/A;   INCONTINENCE SURGERY  2003   left leg vein ligation  2015   SPINE SURGERY  03-18-2021   VARICOSE VEIN SURGERY  1990's   Dr Dessa   VEIN SURGERY Left 11/17/2017   vein closure procedure/Dr Dellie    Social History:  reports that she has never smoked. She has never used smokeless tobacco. She reports current alcohol use of about 14.0 standard drinks of alcohol per week. She reports that she does not use drugs. Family History:  Family History  Problem Relation Age of Onset   Diabetes Mother    Pancreatitis Mother    Heart disease Father    Stroke  Father        x 2   Hypertension Father    Varicose Veins Father    Fibromyalgia Sister    Scoliosis Sister    Arthritis Sister    Diabetes Sister    Breast cancer Neg Hx      HOME MEDICATIONS: Allergies as of 07/13/2024       Reactions   Lisinopril  Cough        Medication List        Accurate as of July 13, 2024 11:14 AM. If you have any questions, ask your nurse or doctor.          ABC PLUS SENIOR ADULTS 50+ PO Take 1 tablet by mouth daily.   Accu-Chek Guide Test test strip Generic drug: glucose blood USE 1 THREE TIMES DAILY TO CHECK GLUCOSE DX E10.65   acetaminophen  650 MG CR tablet Commonly known as: TYLENOL  Take 650 mg by mouth every 8 (eight) hours as needed for pain.   aspirin  EC 81 MG tablet Take 81 mg by mouth daily. Takes at bedtime   fluticasone  50 MCG/ACT nasal spray Commonly known as: FLONASE  SPRAY 2 SPRAYS INTO EACH NOSTRIL EVERY DAY   High Potency Iron  65 MG Tabs Take 1 tablet by mouth daily.   irbesartan  75 MG tablet Commonly known as: AVAPRO  Take 1 tablet (75 mg total) by mouth daily.   Lantus  SoloStar 100 UNIT/ML Solostar Pen Generic drug: insulin  glargine Inject 12 Units into the skin daily. What changed: how much to take   levothyroxine  100 MCG tablet Commonly known as: SYNTHROID  Take 1 tablet (100 mcg total) by mouth daily before breakfast.   NovoLOG  FlexPen 100 UNIT/ML FlexPen Generic drug: insulin  aspart Inject 12 units PLUS 0-20 units by sliding scale THREE TIMES DAILY WITH MEALS  SLIDING SCALE WHILE ON STEROIDS:  For CBG 70-120: 0 units For CBG 121-150: 3 units For CBG 151-200: 4 units For CBG 201-250: 7 units For CBG 251-300: 11 units For CBG 301-350: 15 units For CBG 351-400: 20 units For CBG > 400: 25 units and call your doctor   omeprazole  20 MG tablet Commonly known as: PRILOSEC  OTC Take 20 mg by mouth daily.   oxybutynin  5 MG 24 hr tablet Commonly known as: DITROPAN -XL Take 1 tablet (5 mg total) by  mouth at bedtime.   phenazopyridine  200 MG tablet Commonly known as: PYRIDIUM  Take 1 tablet (200 mg total) by mouth 3 (three) times daily.   potassium chloride  SA 20 MEQ tablet Commonly known as: KLOR-CON  M Take 1 tablet (20 mEq total) by mouth daily as needed. Take along with lasix .   pregabalin  225 MG capsule Commonly known as: LYRICA  Take 1 capsule by mouth at bedtime   rosuvastatin  10 MG tablet Commonly known as: CRESTOR  Take 1 tablet (10 mg total) by  mouth daily.   Vitamin D3 250 MCG (10000 UT) capsule Take 10,000 Units by mouth daily.         ALLERGIES: Allergies  Allergen Reactions   Lisinopril  Cough         OBJECTIVE:   VITAL SIGNS: BP 120/78 (BP Location: Left Arm, Patient Position: Sitting, Cuff Size: Normal)   Pulse 75   Ht 5' 5 (1.651 m)   Wt 175 lb (79.4 kg)   SpO2 97%   BMI 29.12 kg/m      PHYSICAL EXAM:  General: Pt appears well and is in NAD  Lungs: Clear with good BS bilat   Heart: RRR   Extremities:  Lower extremities - trace    Neuro: MS is good with appropriate affect, pt is alert and Ox3    DM foot exam: 03/08/2024  The skin of the feet is without sores or ulcerations, right foot deformity  The pedal pulses are 2+ on right and 2+ on left. The sensation is decreased  to a screening 5.07, 10 gram monofilament on the right     DATA REVIEWED:  Lab Results  Component Value Date   HGBA1C 7.6 (A) 07/13/2024   HGBA1C 7.4 (A) 03/08/2024   HGBA1C 7.9 (A) 11/10/2023     Latest Reference Range & Units 02/09/24 13:35  Sodium 135 - 145 mEq/L 130 (L)  Potassium 3.5 - 5.1 mEq/L 4.3  Chloride 96 - 112 mEq/L 92 (L)  CO2 19 - 32 mEq/L 31  Glucose 70 - 99 mg/dL 891 (H)  BUN 6 - 23 mg/dL 10  Creatinine 9.59 - 8.79 mg/dL 9.36  Calcium  8.4 - 10.5 mg/dL 9.4  Alkaline Phosphatase 39 - 117 U/L 91  Albumin  3.5 - 5.2 g/dL 4.3  AST 0 - 37 U/L 25  ALT 0 - 35 U/L 24  Total Protein 6.0 - 8.3 g/dL 7.3  Total Bilirubin 0.2 - 1.2 mg/dL 0.5   GFR >39.99 mL/min 89.31    Latest Reference Range & Units 02/09/24 13:35  Total CHOL/HDL Ratio  2  Cholesterol 0 - 200 mg/dL 844  HDL Cholesterol >60.99 mg/dL 36.29  LDL (calc) 0 - 99 mg/dL 72  NonHDL  08.43  Triglycerides 0.0 - 149.0 mg/dL 03.9  VLDL 0.0 - 59.9 mg/dL 80.7  VITD 69.99 - 899.99 ng/mL 33.73    Latest Reference Range & Units 02/09/24 13:35  TSH 0.35 - 5.50 uIU/mL 1.09      Latest Reference Range & Units 05/01/15 14:41  Glutamic Acid Decarb Ab < IU/mL >250 (H)   ASSESSMENT / PLAN / RECOMMENDATIONS:   1) Type 1 Diabetes Mellitus, Sub-OPtimally controlled, With neuropathic and retinopathic complications - Most recent A1c of 7.6 %. Goal A1c < 7.0 %.    -A1c is trending down -A prescription for Contour next was sent to the pharmacy Per her request -She continues with glycemic excursions - She will to try the OmniPod again, she has new insurance, and new prescription will be sent to her DME supplier - I have encouraged the patient to continue to take NovoLog  before the meal rather than after the meal to prevent hypoglycemia - I will change insulin  to carb ratio as she has been noted with postprandial hyperglycemia  MEDICATIONS: Continue Lantus  14 units daily Continue Novolog  1:C ratio 1:14 TIDQAC and snacks  Continue correction factor: NovoLog  (BG -130/40) TID       EDUCATION / INSTRUCTIONS: BG monitoring instructions: Patient is instructed to check her blood sugars 3 times a  day, before meals. Call [facility] if: BG persistently < 70 I reviewed the Rule of 15 for the treatment of hypoglycemia in detail with the patient. Literature supplied.   2) Diabetic complications:  Eye: Does  have known diabetic retinopathy.  Neuro/ Feet: Does  have known diabetic peripheral neuropathy. Renal: Patient does not have known baseline CKD. She is  on an ACEI/ARB at present.      3) Hypothyroidism:   - TSH normal  01/2024  Medication Levothyroxine  100 mcg daily   4) Peripheral Neuropathy:  - No change  - A refill for Lyrica  has been sent   Medication  Lyrica  225 mg QHS  F/U in 4 months    Signed electronically by: Stefano Redgie Butts, MD  [facility]  [facility] [address] [place] [place], [place] [address] Phone: [phone] FAX: [phone]   CC: Gretel App, NP [address] [place] [address] Phone: [phone]  Fax: [phone]    Return to Endocrinology clinic as below: Future Appointments  Date Time Provider Department Center  08/09/2024  9:40 AM Gretel App, NP [facility]  03/14/2025  8:50 AM [facility] ANNUAL WELLNESS VISIT [facility]

## 2024-07-14 ENCOUNTER — Telehealth: Payer: Self-pay

## 2024-07-14 ENCOUNTER — Encounter: Payer: Self-pay | Admitting: Internal Medicine

## 2024-07-14 DIAGNOSIS — M720 Palmar fascial fibromatosis [Dupuytren]: Secondary | ICD-10-CM | POA: Diagnosis not present

## 2024-07-14 DIAGNOSIS — M6530 Trigger finger, unspecified finger: Secondary | ICD-10-CM | POA: Diagnosis not present

## 2024-07-14 MED ORDER — PREGABALIN 225 MG PO CAPS: 225.0000 mg | ORAL_CAPSULE | Freq: Every day | ORAL | 1 refills | Status: DC

## 2024-07-14 NOTE — Telephone Encounter (Signed)
 Refill request for controlled drug/ Pregabalin  sent to MD.

## 2024-07-19 DIAGNOSIS — M6530 Trigger finger, unspecified finger: Secondary | ICD-10-CM | POA: Diagnosis not present

## 2024-07-19 DIAGNOSIS — M720 Palmar fascial fibromatosis [Dupuytren]: Secondary | ICD-10-CM | POA: Diagnosis not present

## 2024-07-26 DIAGNOSIS — M6530 Trigger finger, unspecified finger: Secondary | ICD-10-CM | POA: Diagnosis not present

## 2024-07-26 DIAGNOSIS — M720 Palmar fascial fibromatosis [Dupuytren]: Secondary | ICD-10-CM | POA: Diagnosis not present

## 2024-08-02 DIAGNOSIS — M720 Palmar fascial fibromatosis [Dupuytren]: Secondary | ICD-10-CM | POA: Diagnosis not present

## 2024-08-02 DIAGNOSIS — M6530 Trigger finger, unspecified finger: Secondary | ICD-10-CM | POA: Diagnosis not present

## 2024-08-08 ENCOUNTER — Other Ambulatory Visit: Payer: Self-pay | Admitting: Nurse Practitioner

## 2024-08-08 DIAGNOSIS — Z1231 Encounter for screening mammogram for malignant neoplasm of breast: Secondary | ICD-10-CM

## 2024-08-09 ENCOUNTER — Ambulatory Visit: Payer: Medicare Other | Admitting: Nurse Practitioner

## 2024-08-18 DIAGNOSIS — M720 Palmar fascial fibromatosis [Dupuytren]: Secondary | ICD-10-CM | POA: Diagnosis not present

## 2024-08-18 DIAGNOSIS — M6530 Trigger finger, unspecified finger: Secondary | ICD-10-CM | POA: Diagnosis not present

## 2024-08-24 ENCOUNTER — Encounter: Payer: Self-pay | Admitting: Nurse Practitioner

## 2024-08-24 ENCOUNTER — Ambulatory Visit: Admitting: Nurse Practitioner

## 2024-08-24 VITALS — BP 124/70 | HR 76 | Temp 97.7°F | Ht 65.0 in | Wt 170.4 lb

## 2024-08-24 DIAGNOSIS — E1159 Type 2 diabetes mellitus with other circulatory complications: Secondary | ICD-10-CM

## 2024-08-24 DIAGNOSIS — E785 Hyperlipidemia, unspecified: Secondary | ICD-10-CM

## 2024-08-24 DIAGNOSIS — R32 Unspecified urinary incontinence: Secondary | ICD-10-CM | POA: Diagnosis not present

## 2024-08-24 DIAGNOSIS — E103293 Type 1 diabetes mellitus with mild nonproliferative diabetic retinopathy without macular edema, bilateral: Secondary | ICD-10-CM

## 2024-08-24 DIAGNOSIS — I152 Hypertension secondary to endocrine disorders: Secondary | ICD-10-CM

## 2024-08-24 DIAGNOSIS — K219 Gastro-esophageal reflux disease without esophagitis: Secondary | ICD-10-CM | POA: Diagnosis not present

## 2024-08-24 DIAGNOSIS — Z794 Long term (current) use of insulin: Secondary | ICD-10-CM | POA: Diagnosis not present

## 2024-08-24 DIAGNOSIS — E1069 Type 1 diabetes mellitus with other specified complication: Secondary | ICD-10-CM

## 2024-08-24 DIAGNOSIS — E039 Hypothyroidism, unspecified: Secondary | ICD-10-CM

## 2024-08-24 DIAGNOSIS — G4733 Obstructive sleep apnea (adult) (pediatric): Secondary | ICD-10-CM | POA: Diagnosis not present

## 2024-08-24 NOTE — Progress Notes (Signed)
 Leron Glance, NP-C Phone: [phone]  Karla Patton is a 73 y.o. female who presents today for follow up.   Discussed the use of AI scribe software for clinical note transcription with the patient, who gave verbal consent to proceed.  History of Present Illness   Karla Patton is a 73 year old female with sleep apnea who presents with concerns about sleep disturbances and daytime drowsiness.  She experiences sleep disturbances, including dozing off during conversations, raising concerns about her sleep apnea. She was previously diagnosed with sleep apnea following a home test, during which she experienced two episodes of low blood sugar. She uses an oral device for sleep apnea but finds it uncomfortable and ineffective. She wakes up at 2 AM and is unable to return to sleep, attributing this to worries and late bedtimes, especially with two teenagers in the house.  She has diabetes and thyroid  issues, managed by an endocrinologist. Her last A1c was 7.6. She is on Novolog  and Lantus  for diabetes management and takes levothyroxine  for her thyroid  condition. She also takes omeprazole  once daily for acid reflux. She is awaiting an insulin  pump prescription, delayed due to insurance requirements. She uses a Dexcom sensor for glucose monitoring and has previously used an Omnipod insulin  pump.  She experienced low blood pressure over the weekend, with a reading of 98/59, causing dizziness. She ensures adequate hydration and reports a normal blood pressure reading of 124/70 during the visit. She is on irbesartan  for blood pressure management.  She has a history of overactive bladder and mentions a successful treatment with oxybutynin , which she continues to take at night for symptoms.  She has been experiencing a persistent cough for two years. A lung x-ray was clear, and she has been taking Zyrtec and another medication for cold symptoms, although they have not alleviated the cough.  No  chest pain or shortness of breath. No trouble swallowing or bloating.      Social History   Tobacco Use  Smoking Status Never  Smokeless Tobacco Never    Current Outpatient Medications on File Prior to Visit  Medication Sig Dispense Refill   acetaminophen  (TYLENOL ) 650 MG CR tablet Take 650 mg by mouth every 8 (eight) hours as needed for pain.     aspirin  EC 81 MG tablet Take 81 mg by mouth daily. Takes at bedtime     Cholecalciferol  (VITAMIN D3) 250 MCG (10000 UT) capsule Take 10,000 Units by mouth daily.     Ferrous Sulfate  Dried (HIGH POTENCY IRON ) 65 MG TABS Take 1 tablet by mouth daily.     fluticasone  (FLONASE ) 50 MCG/ACT nasal spray SPRAY 2 SPRAYS INTO EACH NOSTRIL EVERY DAY 48 mL 1   glucose blood (CONTOUR NEXT TEST) test strip 1 each by Other route 3 (three) times daily. Use as instructed 300 each 12   insulin  aspart (NOVOLOG  FLEXPEN) 100 UNIT/ML FlexPen Inject 12 units PLUS 0-20 units by sliding scale THREE TIMES DAILY WITH MEALS  SLIDING SCALE WHILE ON STEROIDS:  For CBG 70-120: 0 units For CBG 121-150: 3 units For CBG 151-200: 4 units For CBG 201-250: 7 units For CBG 251-300: 11 units For CBG 301-350: 15 units For CBG 351-400: 20 units For CBG > 400: 25 units and call your doctor     Insulin  Disposable Pump (OMNIPOD 5 G7 PODS, GEN 5,) MISC 1 Device by Does not apply route every other day. 45 each 3   insulin  glargine (LANTUS  SOLOSTAR) 100 UNIT/ML Solostar Pen Inject  12 Units into the skin daily. (Patient taking differently: Inject 14 Units into the skin daily.) 15 mL 4   irbesartan  (AVAPRO ) 75 MG tablet Take 1 tablet (75 mg total) by mouth daily. 90 tablet 1   levothyroxine  (SYNTHROID ) 100 MCG tablet Take 1 tablet (100 mcg total) by mouth daily before breakfast. 90 tablet 3   Multiple Vitamins-Minerals (ABC PLUS SENIOR ADULTS 50+ PO) Take 1 tablet by mouth daily.     omeprazole  (PRILOSEC  OTC) 20 MG tablet Take 20 mg by mouth daily.     oxybutynin  (DITROPAN -XL) 5 MG 24  hr tablet Take 1 tablet (5 mg total) by mouth at bedtime. 90 tablet 1   pregabalin  (LYRICA ) 225 MG capsule Take 1 capsule (225 mg total) by mouth at bedtime. 90 capsule 1   rosuvastatin  (CRESTOR ) 10 MG tablet Take 1 tablet (10 mg total) by mouth daily. 90 tablet 1   No current facility-administered medications on file prior to visit.     ROS see history of present illness  Objective  Physical Exam Vitals:   08/24/24 1521  BP: 124/70  Pulse: 76  Temp: 97.7 F (36.5 C)  SpO2: 97%    BP Readings from Last 3 Encounters:  08/25/24 128/70  08/24/24 124/70  07/13/24 120/78   Wt Readings from Last 3 Encounters:  08/25/24 169 lb (76.7 kg)  08/24/24 170 lb 6.4 oz (77.3 kg)  07/13/24 175 lb (79.4 kg)    Physical Exam Constitutional:      General: She is not in acute distress.    Appearance: Normal appearance.  HENT:     Head: Normocephalic.  Cardiovascular:     Rate and Rhythm: Normal rate and regular rhythm.     Heart sounds: Murmur (chronic) heard.  Pulmonary:     Effort: Pulmonary effort is normal.     Breath sounds: Normal breath sounds.  Skin:    General: Skin is warm and dry.  Neurological:     General: No focal deficit present.     Mental Status: She is alert.  Psychiatric:        Mood and Affect: Mood normal.        Behavior: Behavior normal.      Assessment/Plan: Please see individual problem list.  OSA (obstructive sleep apnea) Assessment & Plan: She experiences difficulty with her oral device, leading to poor sleep and daytime sleepiness, raising safety concerns while driving. She is interested in CPAP or alternatives. Refer to sleep medicine specialist, Dr. Jess, for evaluation and management. Advised not to drive when tired.   Orders: -     Pulmonary Visit  Hypertension associated with diabetes (HCC) Assessment & Plan: Her blood pressure is well-controlled at 124/70, though she had one episode of hypotension with dizziness. She is on irbesartan   75 mg daily, continue. Monitor blood pressure regularly. If hypotension episodes persist, consider adjusting the irbesartan  dosage.   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 She is awaiting an insulin  pump prescription and is familiar with its use, along with a Dexcom sensor. Follow up with her endocrinologist regarding the insulin  pump prescription and prior authorization. Continue current diabetes management with Novolog  and Lantus . Follow up with endo as scheduled.    Hypothyroidism, unspecified type Assessment & Plan: Managed with Levothyroxine  100 mcg  daily. Continue. Asymptomatic.   Hyperlipidemia due to type 1 diabetes mellitus (HCC) Assessment & Plan: Managed with Crestor  10 mg daily. Continue.    Urinary incontinence, unspecified type Assessment & Plan: Her condition is effectively managed with oxybutynin  at bedtime, and a previous UTI has resolved. Continue oxybutynin .    Gastroesophageal reflux disease without esophagitis Assessment & Plan: Her GERD is managed with omeprazole , with no issues reported. Continue omeprazole  once daily.      Return in about 6 months (around 02/21/2025) for Follow up.   Leron Glance, NP-C [facility]

## 2024-08-25 ENCOUNTER — Encounter: Payer: Self-pay | Admitting: Sleep Medicine

## 2024-08-25 ENCOUNTER — Telehealth: Payer: Self-pay

## 2024-08-25 ENCOUNTER — Ambulatory Visit (INDEPENDENT_AMBULATORY_CARE_PROVIDER_SITE_OTHER): Admitting: Sleep Medicine

## 2024-08-25 ENCOUNTER — Other Ambulatory Visit (HOSPITAL_COMMUNITY): Payer: Self-pay

## 2024-08-25 VITALS — BP 128/70 | HR 59 | Temp 98.5°F | Ht 65.0 in | Wt 169.0 lb

## 2024-08-25 DIAGNOSIS — G4710 Hypersomnia, unspecified: Secondary | ICD-10-CM

## 2024-08-25 DIAGNOSIS — I10 Essential (primary) hypertension: Secondary | ICD-10-CM

## 2024-08-25 DIAGNOSIS — H9311 Tinnitus, right ear: Secondary | ICD-10-CM | POA: Diagnosis not present

## 2024-08-25 DIAGNOSIS — H903 Sensorineural hearing loss, bilateral: Secondary | ICD-10-CM | POA: Diagnosis not present

## 2024-08-25 DIAGNOSIS — G4733 Obstructive sleep apnea (adult) (pediatric): Secondary | ICD-10-CM

## 2024-08-25 LAB — COMPREHENSIVE METABOLIC PANEL WITH GFR
ALT: 22 U/L (ref 0–35)
AST: 22 U/L (ref 0–37)
Albumin: 4.2 g/dL (ref 3.5–5.2)
Alkaline Phosphatase: 79 U/L (ref 39–117)
BUN: 28 mg/dL — ABNORMAL HIGH (ref 6–23)
CO2: 32 meq/L (ref 19–32)
Calcium: 9.1 mg/dL (ref 8.4–10.5)
Chloride: 95 meq/L — ABNORMAL LOW (ref 96–112)
Creatinine, Ser: 0.99 mg/dL (ref 0.40–1.20)
GFR: 57.22 mL/min — ABNORMAL LOW (ref >60.00)
Glucose, Bld: 142 mg/dL — ABNORMAL HIGH (ref 70–99)
Potassium: 4.9 meq/L (ref 3.5–5.1)
Sodium: 133 meq/L — ABNORMAL LOW (ref 135–145)
Total Bilirubin: 0.4 mg/dL (ref 0.2–1.2)
Total Protein: 7 g/dL (ref 6.0–8.3)

## 2024-08-25 NOTE — Progress Notes (Signed)
 Name:Karla Patton MRN: [id number] DOB: 1951-11-21   CHIEF COMPLAINT:  EXCESSIVE DAYTIME SLEEPINESS   HISTORY OF PRESENT ILLNESS: [name] is a 73 y.o. w/ a h/o OSA, HTN, hyperlipidemia, IDDM and hypothyroidism who presents to follow up on OSA. Patient was diagnosed with moderate OSA in 2024 and was subsequently started on oral appliance therapy which she used for a few months and discontinued use due to discomfort. Patient does not have a bed partner and is unsure about snoring. Reports excessive daytime sleepiness and fatigue. Reports nocturnal awakenings due to dreams or hypoglycemia and has difficulty falling back to sleep. Reports a 10 lb weight loss. Admits to dry mouth. Denies morning headaches, RLS symptoms, dream enactment, cataplexy, hypnagogic or hypnapompic hallucinations. Denies a family history of sleep apnea. Reports drowsy driving. Drinks 2 cups of coffee and 2 energy drink packets daily, drinks 1-2 glasses of wine nightly, denies tobacco or illicit drug use.   Bedtime 10:30 pm Sleep onset 10 mins Rise time 7 am   EPWORTH SLEEP SCORE     08/25/2024    3:00 PM 03/16/2023    9:00 AM  Results of the Epworth flowsheet  Sitting and reading 3 2  Watching TV 2 2  Sitting, inactive in a public place (e.g. a theatre or a meeting) 0 1  As a passenger in a car for an hour without a break 3 1  Lying down to rest in the afternoon when circumstances permit 0 0  Sitting and talking to someone 2 1  Sitting quietly after a lunch without alcohol 0 1  In a car, while stopped for a few minutes in traffic 2 1  Total score 12 9    PAST MEDICAL HISTORY :   has a past medical history of Allergy (11/2021), Anemia, Anxiety, Arthritis, Back pain, Diabetes mellitus without complication (HCC), Emphysema of lung (HCC), GERD (gastroesophageal reflux disease), Heart murmur, Hypertension associated with diabetes (HCC) (03/03/2018), Hypothyroidism, Thyroid  disease, and Varicose vein.   has a past surgical history that includes Abdominal hysterectomy (1994); Incontinence surgery (2003); Colonoscopy (2009); Varicose vein surgery (1990's); Cholecystectomy (09-07-13); left leg vein ligation (2015); Vein Surgery (Left, 11/17/2017); Colonoscopy with propofol  (N/A, 10/20/2018); and Spine surgery (03-18-2021). Prior to Admission medications   Medication Sig Start Date End Date Taking? Authorizing Provider  acetaminophen  (TYLENOL ) 650 MG CR tablet Take 650 mg by mouth every 8 (eight) hours as needed for pain.   Yes [provider]  aspirin  EC 81 MG tablet Take 81 mg by mouth daily. Takes at bedtime   Yes [provider]  Cholecalciferol  (VITAMIN D3) 250 MCG (10000 UT) capsule Take 10,000 Units by mouth daily.   Yes [provider]  Ferrous Sulfate  Dried (HIGH POTENCY IRON ) 65 MG TABS Take 1 tablet by mouth daily.   Yes [provider]  fluticasone  (FLONASE ) 50 MCG/ACT nasal spray SPRAY 2 SPRAYS INTO EACH NOSTRIL EVERY DAY 09/25/22  Yes Drubel, Manuelita, PA-C  glucose blood (CONTOUR NEXT TEST) test strip 1 each by Other route 3 (three) times daily. Use as instructed 07/13/24  Yes Shamleffer, Ibtehal Jaralla, MD  ibuprofen (ADVIL) 600 MG tablet Take 600 mg by mouth 3 (three) times daily.   Yes [provider]  insulin  aspart (NOVOLOG  FLEXPEN) 100 UNIT/ML FlexPen Inject 12 units PLUS 0-20 units by sliding scale THREE TIMES DAILY WITH MEALS  SLIDING SCALE WHILE ON STEROIDS:  For CBG 70-120: 0 units For CBG 121-150: 3 units For  CBG 151-200: 4 units For CBG 201-250: 7 units For CBG 251-300: 11 units For CBG 301-350: 15 units For CBG 351-400: 20 units For CBG > 400: 25 units and call your doctor 10/12/23  Yes Fausto Sor A, DO  Insulin  Disposable Pump (OMNIPOD 5 G7 PODS, GEN 5,) MISC 1 Device by Does not apply route every other day. 07/13/24  Yes Shamleffer, Ibtehal Jaralla, MD  insulin  glargine (LANTUS  SOLOSTAR) 100 UNIT/ML Solostar Pen Inject 12  Units into the skin daily. Patient taking differently: Inject 14 Units into the skin daily. 05/14/23  Yes Shamleffer, Ibtehal Jaralla, MD  irbesartan  (AVAPRO ) 75 MG tablet Take 1 tablet (75 mg total) by mouth daily. 04/18/24  Yes Gretel App, NP  levothyroxine  (SYNTHROID ) 100 MCG tablet Take 1 tablet (100 mcg total) by mouth daily before breakfast. 04/15/24  Yes Shamleffer, Ibtehal Jaralla, MD  Multiple Vitamins-Minerals (ABC PLUS SENIOR ADULTS 50+ PO) Take 1 tablet by mouth daily. 11/19/20  Yes [provider]  omeprazole  (PRILOSEC  OTC) 20 MG tablet Take 20 mg by mouth daily.   Yes [provider]  ondansetron  (ZOFRAN -ODT) 4 MG disintegrating tablet Take 4 mg by mouth every 8 (eight) hours as needed for nausea, vomiting or refractory nausea / vomiting.   Yes [provider]  oxybutynin  (DITROPAN -XL) 5 MG 24 hr tablet Take 1 tablet (5 mg total) by mouth at bedtime. 07/12/24  Yes Gretel App, NP  oxyCODONE  (OXY IR/ROXICODONE ) 5 MG immediate release tablet Take 5 mg by mouth every 4 (four) hours as needed for moderate pain (pain score 4-6) or breakthrough pain.   Yes [provider]  phenazopyridine  (PYRIDIUM ) 200 MG tablet Take 1 tablet (200 mg total) by mouth 3 (three) times daily. 04/22/24  Yes Bernardino Ditch, NP  potassium chloride  SA (KLOR-CON  M) 20 MEQ tablet Take 1 tablet (20 mEq total) by mouth daily as needed. Take along with lasix . 04/19/24  Yes Gretel App, NP  pregabalin  (LYRICA ) 225 MG capsule Take 1 capsule (225 mg total) by mouth at bedtime. 07/14/24  Yes Shamleffer, Ibtehal Jaralla, MD  rosuvastatin  (CRESTOR ) 10 MG tablet Take 1 tablet (10 mg total) by mouth daily. 04/18/24  Yes Gretel App, NP   Allergies  Allergen Reactions   Lisinopril  Cough    FAMILY HISTORY:  family history includes Arthritis in her sister; Diabetes in her mother and sister; Fibromyalgia in her sister; Heart disease in her father; Hypertension in her father; Pancreatitis in her  mother; Scoliosis in her sister; Stroke in her father; Varicose Veins in her father. SOCIAL HISTORY:  reports that she has never smoked. She has never used smokeless tobacco. She reports current alcohol use of about 14.0 standard drinks of alcohol per week. She reports that she does not use drugs.   Review of Systems:  Gen:  Denies  fever, sweats, chills weight loss  HEENT: Denies blurred vision, double vision, ear pain, eye pain, hearing loss, nose bleeds, sore throat Cardiac:  No dizziness, chest pain or heaviness, chest tightness,edema, No JVD Resp:   No cough, -sputum production, -shortness of breath,-wheezing, -hemoptysis,  Gi: Denies swallowing difficulty, stomach pain, nausea or vomiting, diarrhea, constipation, bowel incontinence Gu:  Denies bladder incontinence, burning urine Ext:   Denies Joint pain, stiffness or swelling Skin: Denies  skin rash, easy bruising or bleeding or hives Endoc:  Denies polyuria, polydipsia , polyphagia or weight change Psych:   Denies depression, insomnia or hallucinations  Other:  All other systems negative  VITAL SIGNS: BP 128/70  Pulse (!) 59   Temp 98.5 F (36.9 C)   Ht 5' 5 (1.651 m)   Wt 169 lb (76.7 kg)   SpO2 98%   BMI 28.12 kg/m    Physical Examination:   General Appearance: No distress  EYES PERRLA, EOM intact.   NECK Supple, No JVD Pulmonary: normal breath sounds, No wheezing.  CardiovascularNormal S1,S2.  No m/r/g.   Abdomen: Benign, Soft, non-tender. Skin:   warm, no rashes, no ecchymosis  Extremities: normal, no cyanosis, clubbing. Neuro:without focal findings,  speech normal  PSYCHIATRIC: Mood, affect within normal limits.   ASSESSMENT AND PLAN  OSA I suspect that OSA is likely present due to clinical presentation. Discussed the consequences of untreated sleep apnea. Advised not to drive drowsy for safety of patient and others. Will complete further evaluation with a home sleep study and follow up to review results.     HTN Stable, on current management. Following with PCP.    MEDICATION ADJUSTMENTS/LABS AND TESTS ORDERED: Recommend Sleep Study   Patient  satisfied with Plan of action and management. All questions answered  Follow up to review HST results and treatment plan.   I spent a total of 43 minutes reviewing chart data, face-to-face evaluation with the patient, counseling and coordination of care as detailed above.    Carolie Mcilrath, M.D.  Sleep Medicine [facility]

## 2024-08-25 NOTE — Patient Instructions (Signed)
 Karla Patton

## 2024-08-25 NOTE — Telephone Encounter (Signed)
 Omnipod needs PA per US  Med

## 2024-08-25 NOTE — Telephone Encounter (Signed)
 Pharmacy Patient Advocate Encounter   Received notification from Pt Calls Messages that prior authorization for OMNIPOD 5 G7 PODS, GEN 5 is required/requested.   Insurance verification completed.   The patient is insured through [facility] .   Per test claim: PA required; PA submitted to above mentioned insurance via Latent Key/confirmation #/EOC [id number] Status is pending   PA has been approved through 12/21/2024. PA Case: [id number]

## 2024-08-26 ENCOUNTER — Ambulatory Visit: Payer: Self-pay | Admitting: Nurse Practitioner

## 2024-08-30 ENCOUNTER — Other Ambulatory Visit: Payer: Self-pay | Admitting: Nurse Practitioner

## 2024-08-30 ENCOUNTER — Telehealth: Payer: Self-pay | Admitting: Nurse Practitioner

## 2024-08-30 DIAGNOSIS — E1159 Type 2 diabetes mellitus with other circulatory complications: Secondary | ICD-10-CM

## 2024-08-30 NOTE — Telephone Encounter (Signed)
 Lab order needed

## 2024-09-01 ENCOUNTER — Other Ambulatory Visit (INDEPENDENT_AMBULATORY_CARE_PROVIDER_SITE_OTHER)

## 2024-09-01 DIAGNOSIS — I152 Hypertension secondary to endocrine disorders: Secondary | ICD-10-CM

## 2024-09-01 DIAGNOSIS — E1159 Type 2 diabetes mellitus with other circulatory complications: Secondary | ICD-10-CM

## 2024-09-01 LAB — BASIC METABOLIC PANEL WITH GFR
BUN: 17 mg/dL (ref 6–23)
CO2: 31 meq/L (ref 19–32)
Calcium: 9.3 mg/dL (ref 8.4–10.5)
Chloride: 94 meq/L — ABNORMAL LOW (ref 96–112)
Creatinine, Ser: 0.86 mg/dL (ref 0.40–1.20)
GFR: 67.74 mL/min (ref >60.00)
Glucose, Bld: 155 mg/dL — ABNORMAL HIGH (ref 70–99)
Potassium: 4.8 meq/L (ref 3.5–5.1)
Sodium: 131 meq/L — ABNORMAL LOW (ref 135–145)

## 2024-09-02 ENCOUNTER — Ambulatory Visit: Payer: Self-pay | Admitting: Nurse Practitioner

## 2024-09-07 NOTE — Assessment & Plan Note (Signed)
 Her blood pressure is well-controlled at 124/70, though she had one episode of hypotension with dizziness. She is on irbesartan  75 mg daily, continue. Monitor blood pressure regularly. If hypotension episodes persist, consider adjusting the irbesartan  dosage.

## 2024-09-07 NOTE — Assessment & Plan Note (Signed)
 Managed with Crestor  10 mg daily. Continue.

## 2024-09-07 NOTE — Assessment & Plan Note (Signed)
 Her recent A1c is 7.6, and she experienced hypoglycemia during a sleep study. She is awaiting an insulin  pump prescription and is familiar with its use, along with a Dexcom sensor. Follow up with her endocrinologist regarding the insulin  pump prescription and prior authorization. Continue current diabetes management with Novolog  and Lantus . Follow up with endo as scheduled.

## 2024-09-07 NOTE — Assessment & Plan Note (Signed)
 Her GERD is managed with omeprazole , with no issues reported. Continue omeprazole  once daily.

## 2024-09-07 NOTE — Assessment & Plan Note (Signed)
 Managed with Levothyroxine  100 mcg daily. Continue. Asymptomatic.

## 2024-09-07 NOTE — Assessment & Plan Note (Addendum)
 She experiences difficulty with her oral device, leading to poor sleep and daytime sleepiness, raising safety concerns while driving. She is interested in CPAP or alternatives. Refer to sleep medicine specialist, Dr. Jess, for evaluation and management. Advised not to drive when tired.

## 2024-09-07 NOTE — Assessment & Plan Note (Signed)
 Her condition is effectively managed with oxybutynin  at bedtime, and a previous UTI has resolved. Continue oxybutynin .

## 2024-09-08 ENCOUNTER — Ambulatory Visit
Admission: RE | Admit: 2024-09-08 | Discharge: 2024-09-08 | Disposition: A | Discharge status: Home / Self Care | Source: Ambulatory Visit | Attending: Nurse Practitioner | Admitting: Nurse Practitioner

## 2024-09-08 DIAGNOSIS — Z1231 Encounter for screening mammogram for malignant neoplasm of breast: Secondary | ICD-10-CM | POA: Insufficient documentation

## 2024-09-14 ENCOUNTER — Ambulatory Visit: Payer: Self-pay | Admitting: Nurse Practitioner

## 2024-09-16 NOTE — Telephone Encounter (Signed)
 Patient came in to office today and picked up 1 box of patient assistance Lantus .

## 2024-09-20 DIAGNOSIS — G5601 Carpal tunnel syndrome, right upper limb: Secondary | ICD-10-CM | POA: Diagnosis not present

## 2024-09-26 ENCOUNTER — Telehealth: Payer: Self-pay

## 2024-09-26 NOTE — Telephone Encounter (Signed)
 Patient picked up 6 boxes of Novolog .

## 2024-09-26 NOTE — Telephone Encounter (Signed)
 Called and informed pt , that pt assistance was received and ready for pick up    Medication: Novolog   Flex Pen QTY: 6 boxes LOT: [id number] EXP: 08-21-26

## 2024-09-26 NOTE — Telephone Encounter (Signed)
Pt picked up today.

## 2024-10-25 ENCOUNTER — Other Ambulatory Visit: Payer: Self-pay

## 2024-10-25 MED ORDER — PREGABALIN 225 MG PO CAPS: 225.0000 mg | ORAL_CAPSULE | Freq: Every day | ORAL | 1 refills | Status: AC

## 2024-10-25 MED ORDER — PREGABALIN 225 MG PO CAPS: 225.0000 mg | ORAL_CAPSULE | Freq: Every day | ORAL | 1 refills | Status: DC

## 2024-10-25 NOTE — Addendum Note (Signed)
 Addended by: Tayana Shankle M on: 10/25/2024 12:22 PM   Modules accepted: Orders

## 2024-10-25 NOTE — Addendum Note (Signed)
 Addended by: SAM DONELL PARAS on: 10/25/2024 03:17 PM   Modules accepted: Orders

## 2024-11-11 ENCOUNTER — Telehealth: Payer: Self-pay

## 2024-11-11 NOTE — Telephone Encounter (Signed)
 PAP: Patient assistance application for Lantus  through [employer] has been mailed to pt's home address on file. Provider portion of application will be faxed to provider's office.

## 2024-11-14 ENCOUNTER — Ambulatory Visit (INDEPENDENT_AMBULATORY_CARE_PROVIDER_SITE_OTHER): Admitting: Internal Medicine

## 2024-11-14 ENCOUNTER — Telehealth: Payer: Self-pay | Admitting: Internal Medicine

## 2024-11-14 ENCOUNTER — Encounter: Payer: Self-pay | Admitting: Internal Medicine

## 2024-11-14 VITALS — BP 144/84 | Ht 65.0 in | Wt 175.0 lb

## 2024-11-14 DIAGNOSIS — E1065 Type 1 diabetes mellitus with hyperglycemia: Secondary | ICD-10-CM

## 2024-11-14 DIAGNOSIS — E1042 Type 1 diabetes mellitus with diabetic polyneuropathy: Secondary | ICD-10-CM | POA: Diagnosis not present

## 2024-11-14 DIAGNOSIS — E039 Hypothyroidism, unspecified: Secondary | ICD-10-CM

## 2024-11-14 LAB — POCT GLYCOSYLATED HEMOGLOBIN (HGB A1C): Hemoglobin A1C: 8.7 % — AB (ref 4.0–5.6)

## 2024-11-14 NOTE — Progress Notes (Signed)
 Name: Karla Patton  MRN/ DOB: [id number], [birth year]   Age/ Sex: 73 y.o., female    PCP: Gretel App, NP   Reason for Endocrinology Evaluation: Type 1 Diabetes Mellitus     Date of Initial Endocrinology Visit: 02/27/2022    PATIENT IDENTIFIER: Karla Patton is a 73 y.o. female with a past medical history of T1DM, Hypothyroidism, Dyslipidemia . The patient presented for initial endocrinology clinic visit on 02/27/2022 for consultative assistance with her diabetes management.    HPI: Karla Patton was    Diagnosed with DM at age 63  Prior Medications tried/Intolerance: She was on Medtronic but did not like the tubing   Hemoglobin A1c has ranged from 6.8% in 2022, peaking at 8.9% in 2019. Patient required assistance for hypoglycemia: yes x2 over the years    On her initial visit to our clinic, her A1c is 7.5% she was on Lantus , and NovoLog  which we adjusted   She was started on OmniPod in September 2023 but by January 2024 she had to stop the OmniPod due to increasing cost   By July, 2025 the patient opted to try the OmniPod again as she had new insurance at the time   SUBJECTIVE:   During the last visit (07/13/2024): A1c 7.6%   Today (11/14/24): Karla Patton is here for follow-up on diabetes management.  She checks her blood sugars multiple  times daily, through CGM .The patient has had hypoglycemic episodes since the last clinic visit , she is not symptomatic with these episodes.    She continues to follow-up with orthopedics for Dupuytren contractures.  She did have surgery in July, 2025 but unfortunately the patient continues to be symptomatic  Denies nausea nausea  No changes in bowel movements, she does take preprobiotics on occasions when needed  She is on Lyrica  225 mg for neuropathy, has noted tingling around her ankles at night    HOME DIABETES REGIMEN: Lantus  14 units daily -patient assistance NovoLog  1:15-patient assistance CF: Novolog  (BG-130/40)  TIDQAC Levothyroxine  100 mcg daily  Lyrica  225 mg  QHS    Statin: yes ACE-I/ARB: yes     CONTINUOUS GLUCOSE MONITORING RECORD INTERPRETATION    Dates of Recording: 11/11-11/24/2025  Sensor description: dexcom  Results statistics:   CGM use % of time 95  Average and SD 226/88  Time in range 32%  % Time Above 180 26  % Time above 250 41  % Time Below target 1      Glycemic patterns summary: Hyperglycemia is noted throughout the day and night Hyperglycemic episodes  postprandial  Hypoglycemic episodes occurred after a bolus  Overnight periods: Variable but mostly optimal    DIABETIC COMPLICATIONS: Microvascular complications:  Non proliferative DR, neuropathy  Denies: CKD  Last eye exam: Completed 02/06/2023  Macrovascular complications:   Denies: CAD, PVD, CVA   PAST HISTORY: Past Medical History:  Past Medical History:  Diagnosis Date   Allergy 11/2021   Lisinolpril-cough   Anemia    Anxiety    Arthritis    Back pain    Diabetes mellitus without complication (HCC)    type 1   Emphysema of lung (HCC)    GERD (gastroesophageal reflux disease)    Heart murmur    Hypertension associated with diabetes (HCC) 03/03/2018   Hypothyroidism    Thyroid  disease    Varicose vein    Past Surgical History:  Past Surgical History:  Procedure Laterality Date   ABDOMINAL HYSTERECTOMY  1994   CHOLECYSTECTOMY  09-07-13   COLONOSCOPY  2009   normal   COLONOSCOPY WITH PROPOFOL  N/A 10/20/2018   Procedure: COLONOSCOPY WITH PROPOFOL ;  Surgeon: Dessa Reyes ORN, MD;  Location: [facility] ENDOSCOPY;  Service: Endoscopy;  Laterality: N/A;   INCONTINENCE SURGERY  2003   left leg vein ligation  2015   SPINE SURGERY  03-18-2021   VARICOSE VEIN SURGERY  1990's   Dr Dessa   VEIN SURGERY Left 11/17/2017   vein closure procedure/Dr Dellie    Social History:  reports that she has never smoked. She has never used smokeless tobacco. She reports current alcohol use of about  14.0 standard drinks of alcohol per week. She reports that she does not use drugs. Family History:  Family History  Problem Relation Age of Onset   Diabetes Mother    Pancreatitis Mother    Heart disease Father    Stroke Father        x 2   Hypertension Father    Varicose Veins Father    Fibromyalgia Sister    Scoliosis Sister    Arthritis Sister    Diabetes Sister    Breast cancer Neg Hx      HOME MEDICATIONS: Allergies as of 11/14/2024       Reactions   Lisinopril  Cough        Medication List        Accurate as of November 14, 2024 11:27 AM. If you have any questions, ask your nurse or doctor.          STOP taking these medications    acetaminophen  650 MG CR tablet Commonly known as: TYLENOL  Stopped by: Tashiba Timoney J Aswad Wandrey   ibuprofen 600 MG tablet Commonly known as: ADVIL Stopped by: Taheerah Guldin J Michaelanthony [place]empt[place]   Omnipod 5 G7 Pods (Gen 5) Misc Stopped by: Donell PARAS Matthe Sloane   ondansetron  4 MG disintegrating tablet Commonly known as: ZOFRAN -ODT Stopped by: Trooper Olander J Eular Panek   oxyCODONE  5 MG immediate release tablet Commonly known as: Oxy IR/ROXICODONE  Stopped by: Alexxia Stankiewicz J Saige [place]   Vitamin D3 250 MCG (10000 UT) capsule Stopped by: Kel Senn J Quinnley Colasurdo       TAKE these medications    ABC PLUS SENIOR ADULTS 50+ PO Take 1 tablet by mouth daily.   aspirin  EC 81 MG tablet Take 81 mg by mouth daily. Takes at bedtime   Contour Next Test test strip Generic drug: glucose blood 1 each by Other route 3 (three) times daily. Use as instructed   Dexcom G7 Sensor Misc by Does not apply route.   fluticasone  50 MCG/ACT nasal spray Commonly known as: FLONASE  SPRAY 2 SPRAYS INTO EACH NOSTRIL EVERY DAY   High Potency Iron  65 MG Tabs Take 1 tablet by mouth daily.   irbesartan  75 MG tablet Commonly known as: AVAPRO  Take 1 tablet (75 mg total) by mouth daily.   Lantus  SoloStar 100 UNIT/ML Solostar Pen Generic drug: insulin  glargine Inject 12  Units into the skin daily. What changed: how much to take   levothyroxine  100 MCG tablet Commonly known as: SYNTHROID  Take 1 tablet (100 mcg total) by mouth daily before breakfast.   NovoLOG  FlexPen 100 UNIT/ML FlexPen Generic drug: insulin  aspart Inject 12 units PLUS 0-20 units by sliding scale THREE TIMES DAILY WITH MEALS  SLIDING SCALE WHILE ON STEROIDS:  For CBG 70-120: 0 units For CBG 121-150: 3 units For CBG 151-200: 4 units For CBG 201-250: 7 units For CBG 251-300: 11 units For CBG 301-350: 15 units For CBG 351-400:  20 units For CBG > 400: 25 units and call your doctor   omeprazole  20 MG tablet Commonly known as: PRILOSEC  OTC Take 20 mg by mouth daily.   oxybutynin  5 MG 24 hr tablet Commonly known as: DITROPAN -XL Take 1 tablet (5 mg total) by mouth at bedtime.   pregabalin  225 MG capsule Commonly known as: LYRICA  Take 1 capsule (225 mg total) by mouth at bedtime.   rosuvastatin  10 MG tablet Commonly known as: CRESTOR  Take 1 tablet (10 mg total) by mouth daily.         ALLERGIES: Allergies  Allergen Reactions   Lisinopril  Cough         OBJECTIVE:   VITAL SIGNS: BP (!) 144/84   Ht 5' 5 (1.651 m)   Wt 175 lb (79.4 kg)   BMI 29.12 kg/m      PHYSICAL EXAM:  General: Pt appears well and is in NAD  Lungs: Clear with good BS bilat   Heart: RRR , + systolic murmur  Extremities:  Lower extremities -no edema  Neuro: MS is good with appropriate affect, pt is alert and Ox3    DM foot exam: 03/08/2024  The skin of the feet is without sores or ulcerations, right foot deformity  The pedal pulses are 2+ on right and 2+ on left. The sensation is decreased  to a screening 5.07, 10 gram monofilament on the right     DATA REVIEWED:  Lab Results  Component Value Date   HGBA1C 7.6 (A) 07/13/2024   HGBA1C 7.4 (A) 03/08/2024   HGBA1C 7.9 (A) 11/10/2023       Latest Reference Range & Units 09/01/24 10:49  Sodium 135 - 145 mEq/L 131 (L)   Potassium 3.5 - 5.1 mEq/L 4.8  Chloride 96 - 112 mEq/L 94 (L)  CO2 19 - 32 mEq/L 31  Glucose 70 - 99 mg/dL 844 (H)  BUN 6 - 23 mg/dL 17  Creatinine 9.59 - 8.79 mg/dL 9.13  Calcium  8.4 - 10.5 mg/dL 9.3  GFR >39.99 mL/min 67.74      Latest Reference Range & Units 05/01/15 14:41  Glutamic Acid Decarb Ab < IU/mL >250 (H)   ASSESSMENT / PLAN / RECOMMENDATIONS:   1) Type 1 Diabetes Mellitus, Poorly controlled, With neuropathic and retinopathic complications - Most recent A1c of 8.7%. Goal A1c < 7.0 %.    -A1c is worsening in nature -In reviewing CGM data, the patient has been noted with glycemic excursions with BGs ranging from 50-400 mg/dL .  She assures me compliance with her insulin  intake and carbohydrate counting -I will adjust her prandial insulin  as below -I will also change her sensitivity factor as I suspect this is why she ends up with hypoglycemia  -We have attempted to prescribe the OmniPod twice, with the latest prescription was this year, but continues to be cost prohibitive.  She is interested in Medtronic pump.  - A note was sent to the staff to contact Medtronic rep to see if they can give us  a price quote as I do suspect this may be similar to the OmniPod    MEDICATIONS: Continue Lantus  14 units daily Change Novolog  1:C ratio 1:10 TIDQAC and snacks  Change correction factor: NovoLog  (BG -130/45) TID       EDUCATION / INSTRUCTIONS: BG monitoring instructions: Patient is instructed to check her blood sugars 3 times a day, before meals. Call [facility] if: BG persistently < 70 I reviewed the Rule of 15 for the treatment  of hypoglycemia in detail with the patient. Literature supplied.   2) Diabetic complications:  Eye: Does  have known diabetic retinopathy.  Neuro/ Feet: Does  have known diabetic peripheral neuropathy. Renal: Patient does not have known baseline CKD. She is  on an ACEI/ARB at present.      3) Hypothyroidism:   -  Historically TFTs have been within normal range - No change  Medication Continue levothyroxine  100 mcg daily   4) Peripheral Neuropathy:  - Patient on Lyrica  as below  Medication  Lyrica  225 mg QHS  F/U in 3 months      In addition to time spent performing glucose monitor, I spent 30 minutes preparing to see the patient by review of recent labs, imaging and procedures, obtaining and reviewing separately obtained history, communicating with the patient/family or caregiver, ordering medications, tests or procedures, and documenting clinical information in the EHR including the differential Dx, treatment, and any further evaluation and other management    Signed electronically by: Stefano Redgie Butts, MD  [facility]  [facility] [address] [place], [place] [address] Phone: [phone] FAX: [phone]   CC: Gretel App, NP [address] [place] [address] Phone: [phone]  Fax: [phone]    Return to Endocrinology clinic as below: Future Appointments  Date Time Provider Department Center  02/22/2025 10:00 AM Lester, Kacy, NP [facility] [address]  03/14/2025  8:50 AM [facility] ANNUAL WELLNESS VISIT [facility] [address]

## 2024-11-14 NOTE — Telephone Encounter (Signed)
 The patient is interested in a Medtronic pump, can you please contact the rep and see if they can give us  a price range for planned Medicare?  We have tried the OmniPod twice and each time the cost is very high  Patient is interested in a Medtronic pump  Thanks

## 2024-11-14 NOTE — Patient Instructions (Addendum)
 Continue Lantus  14 units daily  Novolog  one unit for every 10 grams of carbohydrates with meals and snacks  Novolog  correctional insulin : ADD extra units on insulin  to your meal-time Novolog  dose if your blood sugars are higher than 175. Use the scale below to help guide you:   Blood sugar before meal Number of units to inject  Less than 175 0 unit  176 - 220 1 units  221 - 265 2 units  266 - 310 3 units  311 - 355 4 units  356 - 400 5 units  401 - 445 6 units     HOW TO TREAT LOW BLOOD SUGARS (Blood sugar LESS THAN 70 MG/DL) Please follow the RULE OF 15 for the treatment of hypoglycemia treatment (when your (blood sugars are less than 70 mg/dL)   STEP 1: Take 15 grams of carbohydrates when your blood sugar is low, which includes:  3-4 GLUCOSE TABS  OR 3-4 OZ OF JUICE OR REGULAR SODA OR ONE TUBE OF GLUCOSE GEL    STEP 2: RECHECK blood sugar in 15 MINUTES STEP 3: If your blood sugar is still low at the 15 minute recheck --> then, go back to STEP 1 and treat AGAIN with another 15 grams of carbohydrates.

## 2024-11-15 ENCOUNTER — Telehealth: Payer: Self-pay

## 2024-11-15 NOTE — Telephone Encounter (Signed)
 Application for Lantus  Pens placed in provider folder

## 2024-11-15 NOTE — Telephone Encounter (Signed)
 Form faxed

## 2024-11-15 NOTE — Telephone Encounter (Signed)
Order placed in parachute

## 2024-11-15 NOTE — Telephone Encounter (Signed)
 Received provider portion PAP application Lantus  ([employer])

## 2024-11-21 DIAGNOSIS — D2261 Melanocytic nevi of right upper limb, including shoulder: Secondary | ICD-10-CM | POA: Diagnosis not present

## 2024-11-21 DIAGNOSIS — L570 Actinic keratosis: Secondary | ICD-10-CM | POA: Diagnosis not present

## 2024-11-21 DIAGNOSIS — D485 Neoplasm of uncertain behavior of skin: Secondary | ICD-10-CM | POA: Diagnosis not present

## 2024-11-21 DIAGNOSIS — L821 Other seborrheic keratosis: Secondary | ICD-10-CM | POA: Diagnosis not present

## 2024-11-21 DIAGNOSIS — D225 Melanocytic nevi of trunk: Secondary | ICD-10-CM | POA: Diagnosis not present

## 2024-11-21 DIAGNOSIS — Z23 Encounter for immunization: Secondary | ICD-10-CM | POA: Diagnosis not present

## 2024-11-21 DIAGNOSIS — D2271 Melanocytic nevi of right lower limb, including hip: Secondary | ICD-10-CM | POA: Diagnosis not present

## 2024-11-21 DIAGNOSIS — D2262 Melanocytic nevi of left upper limb, including shoulder: Secondary | ICD-10-CM | POA: Diagnosis not present

## 2024-11-21 DIAGNOSIS — D2272 Melanocytic nevi of left lower limb, including hip: Secondary | ICD-10-CM | POA: Diagnosis not present

## 2024-11-24 DIAGNOSIS — H903 Sensorineural hearing loss, bilateral: Secondary | ICD-10-CM | POA: Diagnosis not present

## 2024-12-13 ENCOUNTER — Other Ambulatory Visit: Payer: Self-pay | Admitting: Internal Medicine

## 2024-12-13 DIAGNOSIS — E1042 Type 1 diabetes mellitus with diabetic polyneuropathy: Secondary | ICD-10-CM

## 2024-12-13 DIAGNOSIS — E1065 Type 1 diabetes mellitus with hyperglycemia: Secondary | ICD-10-CM

## 2024-12-13 NOTE — Telephone Encounter (Signed)
 Reached out to patient regarding PAP application for Lantus . Left HIPAA compliant v/m to return call for any assistance.

## 2024-12-16 ENCOUNTER — Other Ambulatory Visit

## 2024-12-17 LAB — C-PEPTIDE: C-Peptide: 0.12 ng/mL — ABNORMAL LOW (ref 0.80–3.85)

## 2024-12-17 LAB — GLUCOSE, FASTING: Glucose, Bld: 139 mg/dL — ABNORMAL HIGH (ref 65–99)

## 2024-12-20 ENCOUNTER — Telehealth: Payer: Self-pay | Admitting: Nutrition

## 2024-12-20 ENCOUNTER — Other Ambulatory Visit: Payer: Self-pay | Admitting: Internal Medicine

## 2024-12-20 MED ORDER — INSULIN ASPART 100 UNIT/ML IJ SOLN: INTRAMUSCULAR | 99 refills | Status: DC

## 2024-12-20 NOTE — Telephone Encounter (Signed)
 Patient is starting insulin  pump on 01/04/25, and is needing Novolog  insulin  in a vial called into the pharmacy

## 2025-01-02 ENCOUNTER — Encounter: Payer: Self-pay | Admitting: Internal Medicine

## 2025-01-02 MED ORDER — INSULIN ASPART 100 UNIT/ML IJ SOLN: INTRAMUSCULAR | 6 refills | Status: AC

## 2025-01-02 NOTE — Telephone Encounter (Signed)
 Spoke with pharmacy and they just needed the novolin prescription to have a diagnosis code on it so [employer] medicare will cover it.  New script sent with info attached.

## 2025-01-11 ENCOUNTER — Other Ambulatory Visit: Payer: Self-pay | Admitting: Nurse Practitioner

## 2025-01-11 ENCOUNTER — Telehealth: Payer: Self-pay

## 2025-01-11 DIAGNOSIS — R32 Unspecified urinary incontinence: Secondary | ICD-10-CM

## 2025-01-11 NOTE — Telephone Encounter (Signed)
 Patient is needing to refill her insulin  reservoir  on her medtronic and would like to know how much to put in.  Summary uploaded to media.

## 2025-01-12 NOTE — Telephone Encounter (Signed)
 Patient just needs to know how much insulin  she needs to put in the pump

## 2025-01-12 NOTE — Telephone Encounter (Signed)
Spoke with patient and gave her directions

## 2025-01-12 NOTE — Telephone Encounter (Signed)
 My understanding the maximum is 200 units of insulin  which is 2 mL.  But in the future any questions related to work at the pump or doing anything directly to the pump (that has nothing to do with sugar issues), will need to go directly to [place]

## 2025-01-13 ENCOUNTER — Other Ambulatory Visit: Payer: Self-pay

## 2025-01-13 DIAGNOSIS — E1065 Type 1 diabetes mellitus with hyperglycemia: Secondary | ICD-10-CM

## 2025-01-13 MED ORDER — ACCU-CHEK GUIDE TEST VI STRP: ORAL_STRIP | 12 refills | Status: AC

## 2025-01-17 NOTE — Telephone Encounter (Signed)
 Patient reports that she is filling the cartridge with 200u and filled the on 01/11/25.  She now has 70u still remaining.  She was told to fill the cartridge with only 150u.  She was told to change the cartridge every 3 days.   Also she reports having to reduce the recommended meal time insulin  dose by 1/2, because she is always dropping low 2 hours after the meals.  She also says that she is having a square of graham cracker with peanut butter every night at 10PM without bolusing or she says she will drop low around 11PM Patient started auto mode this on the 01/11/25, and download shows not lows since then.   Pump settings:  basal rate: 0.45u/hr, I/C: 10, ISF: 45 and 70-110.   Download placed on Dr. Kris desk.

## 2025-01-17 NOTE — Telephone Encounter (Signed)
 Patient talked through making changes to her pump settings as above:-basal rate:0.425u/hr, and carb ratio: 12.

## 2025-01-17 NOTE — Telephone Encounter (Signed)
 LVM to make changes to her carb ratio and basal rate as above and my phone number if she needs help in making those changes.

## 2025-01-18 ENCOUNTER — Telehealth: Payer: Self-pay

## 2025-01-18 NOTE — Telephone Encounter (Signed)
 Left message advising patient of sample of lantus  ready for pick up by Friday at 12.

## 2025-01-19 ENCOUNTER — Encounter: Payer: Self-pay | Admitting: Internal Medicine

## 2025-01-19 ENCOUNTER — Ambulatory Visit (INDEPENDENT_AMBULATORY_CARE_PROVIDER_SITE_OTHER): Admitting: Internal Medicine

## 2025-01-19 VITALS — BP 162/80 | Ht 65.0 in | Wt 181.0 lb

## 2025-01-19 DIAGNOSIS — E1065 Type 1 diabetes mellitus with hyperglycemia: Secondary | ICD-10-CM | POA: Diagnosis not present

## 2025-01-19 DIAGNOSIS — E1042 Type 1 diabetes mellitus with diabetic polyneuropathy: Secondary | ICD-10-CM | POA: Diagnosis not present

## 2025-01-19 DIAGNOSIS — E039 Hypothyroidism, unspecified: Secondary | ICD-10-CM

## 2025-01-19 LAB — POCT GLYCOSYLATED HEMOGLOBIN (HGB A1C): Hemoglobin A1C: 7.9 % — AB (ref 4.0–5.6)

## 2025-01-19 MED ORDER — LEVOTHYROXINE SODIUM 100 MCG PO TABS: 100.0000 ug | ORAL_TABLET | Freq: Every day | ORAL | 3 refills | Status: AC

## 2025-01-19 NOTE — Progress Notes (Signed)
 " Name: Karla Patton  MRN/ DOB: [id number], 1951-07-25   Age/ Sex: 74 y.o., female    PCP: Gretel App, NP   Reason for Endocrinology Evaluation: Type 1 Diabetes Mellitus     Date of Initial Endocrinology Visit: 02/27/2022    PATIENT IDENTIFIER: [name] is a 74 y.o. female with a past medical history of T1DM, Hypothyroidism, Dyslipidemia . The patient presented for initial endocrinology clinic visit on 02/27/2022 for consultative assistance with her diabetes management.    HPI: Karla Patton was    Diagnosed with DM at age 21  Prior Medications tried/Intolerance: She was on Medtronic but did not like the tubing   Hemoglobin A1c has ranged from 6.8% in 2022, peaking at 8.9% in 2019. Patient required assistance for hypoglycemia: yes x2 over the years    On her initial visit to our clinic, her A1c is 7.5% she was on Lantus , and NovoLog  which we adjusted   She was started on OmniPod in September 2023 but by January 2024 she had to stop the OmniPod due to increasing cost   By July, 2025 the patient opted to try the OmniPod again as she had new insurance at the time   Patient switched to Medtronic pump in December, 2025   SUBJECTIVE:   During the last visit (11/14/2024): A1c 8.7%   Today (01/19/25): Karla Patton is here for follow-up on diabetes management.  She checks her blood sugars multiple  times daily, through CGM .The pt continues with hypoglycemia, no symptoms but gets the alarm.   No nausea  No changes in bowel movements , on pre   This patient with type 1 diabetes is treated with Medtronic  (insulin  pump). During the visit the pump basal and bolus doses were reviewed including carb/insulin  rations and supplemental doses. The clinical list was updated. The glucose meter download was reviewed in detail to determine if the current pump settings are providing the best glycemic control without excessive hypoglycemia.  Pump and CGM download:    Pump    Medtronic Settings   Insulin  type   NovoLog    Basal rate       0000  0.425 u/h               I:C ratio       0000 1:12                  Sensitivity       0000  45      Goal       0000  110           Body mass index is 30.12 kg/m.  PUMP/CGM STATISTICS:                       CONTINUOUS GLUCOSE MONITORING RECORD INTERPRETATION       Glycemic patterns summary: BGs are optimal overnight and fluctuate during the day Hyperglycemic episodes  postprandial  Hypoglycemic episodes occurred at variable times  Overnight periods: Mostly within range    HOME DIABETES REGIMEN: Novolog   Levothyroxine  100 mcg daily  Lyrica  225 mg  QHS    Statin: yes ACE-I/ARB: yes       DIABETIC COMPLICATIONS: Microvascular complications:  Non proliferative DR, neuropathy  Denies: CKD  Last eye exam: Completed 02/06/2023  Macrovascular complications:   Denies: CAD, PVD, CVA   PAST HISTORY: Past Medical History:  Past Medical History:  Diagnosis Date   Allergy 11/2021  Lisinolpril-cough   Anemia    Anxiety    Arthritis    Back pain    Diabetes mellitus without complication (HCC)    type 1   Emphysema of lung (HCC)    GERD (gastroesophageal reflux disease)    Heart murmur    Hypertension associated with diabetes (HCC) 03/03/2018   Hypothyroidism    Thyroid  disease    Varicose vein    Past Surgical History:  Past Surgical History:  Procedure Laterality Date   ABDOMINAL HYSTERECTOMY  1994   CHOLECYSTECTOMY  09-07-13   COLONOSCOPY  2009   normal   COLONOSCOPY WITH PROPOFOL  N/A 10/20/2018   Procedure: COLONOSCOPY WITH PROPOFOL ;  Surgeon: Dessa Reyes ORN, MD;  Location: [facility] ENDOSCOPY;  Service: Endoscopy;  Laterality: N/A;   INCONTINENCE SURGERY  2003   left leg vein ligation  2015   SPINE SURGERY  03-18-2021   VARICOSE VEIN SURGERY  1990's   Dr Dessa   VEIN SURGERY Left 11/17/2017   vein closure procedure/Dr Dellie    Social  History:  reports that she has never smoked. She has never used smokeless tobacco. She reports current alcohol use of about 14.0 standard drinks of alcohol per week. She reports that she does not use drugs. Family History:  Family History  Problem Relation Age of Onset   Diabetes Mother    Pancreatitis Mother    Heart disease Father    Stroke Father        x 2   Hypertension Father    Varicose Veins Father    Fibromyalgia Sister    Scoliosis Sister    Arthritis Sister    Diabetes Sister    Breast cancer Neg Hx      HOME MEDICATIONS: Allergies as of 01/19/2025       Reactions   Lisinopril  Cough        Medication List        Accurate as of January 19, 2025  1:56 PM. If you have any questions, ask your nurse or doctor.          STOP taking these medications    Lantus  SoloStar 100 UNIT/ML Solostar Pen Generic drug: insulin  glargine Stopped by: Donell Butts, MD       TAKE these medications    ABC PLUS SENIOR ADULTS 50+ PO Take 1 tablet by mouth daily.   Accu-Chek Guide Test test strip Generic drug: glucose blood Use as instructed   aspirin  EC 81 MG tablet Take 81 mg by mouth daily. Takes at bedtime   Dexcom G7 Sensor Misc by Does not apply route.   fluticasone  50 MCG/ACT nasal spray Commonly known as: FLONASE  SPRAY 2 SPRAYS INTO EACH NOSTRIL EVERY DAY   High Potency Iron  65 MG Tabs Take 1 tablet by mouth daily.   insulin  aspart 100 UNIT/ML injection Commonly known as: novoLOG  Max daily 80 units dx E10.65 dx E11.42   irbesartan  75 MG tablet Commonly known as: AVAPRO  Take 1 tablet (75 mg total) by mouth daily.   levothyroxine  100 MCG tablet Commonly known as: SYNTHROID  Take 1 tablet (100 mcg total) by mouth daily before breakfast.   Medtronic MiniMed 780G Kit by Does not apply route.   omeprazole  20 MG tablet Commonly known as: PRILOSEC  OTC Take 20 mg by mouth daily.   oxybutynin  5 MG 24 hr tablet Commonly known as:  DITROPAN -XL TAKE 1 TABLET AT BEDTIME   pregabalin  225 MG capsule Commonly known as: LYRICA  Take 1 capsule (225 mg total)  by mouth at bedtime.   rosuvastatin  10 MG tablet Commonly known as: CRESTOR  Take 1 tablet (10 mg total) by mouth daily.         ALLERGIES: Allergies  Allergen Reactions   Lisinopril  Cough         OBJECTIVE:   VITAL SIGNS: BP (!) 162/80   Ht 5' 5 (1.651 m)   Wt 181 lb (82.1 kg)   BMI 30.12 kg/m      PHYSICAL EXAM:  General: Pt appears well and is in NAD  Lungs: Clear with good BS bilat   Heart: RRR , + systolic murmur  Extremities:  Lower extremities -no edema  Neuro: MS is good with appropriate affect, pt is alert and Ox3    DM foot exam:01/19/2025  The skin of the feet is without sores or ulcerations, right foot deformity  The pedal pulses are 2+ on right and 2+ on left. The sensation is decreased  to a screening 5.07, 10 gram monofilament on the right     DATA REVIEWED:  Lab Results  Component Value Date   HGBA1C 7.9 (A) 01/19/2025   HGBA1C 8.7 (A) 11/14/2024   HGBA1C 7.6 (A) 07/13/2024       Latest Reference Range & Units 09/01/24 10:49  Sodium 135 - 145 mEq/L 131 (L)  Potassium 3.5 - 5.1 mEq/L 4.8  Chloride 96 - 112 mEq/L 94 (L)  CO2 19 - 32 mEq/L 31  Glucose 70 - 99 mg/dL 844 (H)  BUN 6 - 23 mg/dL 17  Creatinine 9.59 - 8.79 mg/dL 9.13  Calcium  8.4 - 10.5 mg/dL 9.3  GFR >39.99 mL/min 67.74      Latest Reference Range & Units 05/01/15 14:41  Glutamic Acid Decarb Ab < IU/mL >250 (H)   ASSESSMENT / PLAN / RECOMMENDATIONS:   1) Type 1 Diabetes Mellitus, Poorly controlled, With neuropathic and retinopathic complications - Most recent A1c of 7.9%. Goal A1c < 7.0 %.    -A1c has trended down from 8.7% to 7.9% within a month of being on the Medtronic insulin  pump -The patient endorses hypoglycemia, especially at night.  I did decrease her basal rate a few days ago, and it appears that her most recent hypoglycemic  episodes are related to correction bolus.  I will adjust her sensitivity factor I from 45 to 55 -Due to fear of hypoglycemia, she has been entering less carbohydrates than actual consumption, I will adjust her insulin  to carb ratio from 12 to 15.  But she was strongly encouraged to continue to bolus and continue that habit as yesterday 01/18/2025 there was no carbohydrate entry at all, which has caused all the bolus to kick in, which also creates glycemic excursions    MEDICATIONS: Novolog     Pump   Medtronic Settings   Insulin  type   NovoLog    Basal rate       0000  0.425 u/h               I:C ratio       0000 1:15                  Sensitivity       0000  55      Goal       0000  110         EDUCATION / INSTRUCTIONS: BG monitoring instructions: Patient is instructed to check her blood sugars 3 times a day, before meals. Call [facility] if: BG persistently <  70 I reviewed the Rule of 15 for the treatment of hypoglycemia in detail with the patient. Literature supplied.   2) Diabetic complications:  Eye: Does  have known diabetic retinopathy.  Neuro/ Feet: Does  have known diabetic peripheral neuropathy. Renal: Patient does not have known baseline CKD. She is  on an ACEI/ARB at present.   3) Hypothyroidism:   - TFTs normal in the past on this dose - No change  Medication Continue levothyroxine  100 mcg daily   4) Peripheral Neuropathy:  - Patient on Lyrica   Medication  Lyrica  225 mg QHS  F/U in 3 months     In addition to time spent performing glucose monitor, I spent 25 minutes preparing to see the patient by review of recent labs, imaging and procedures, obtaining and reviewing separately obtained history, communicating with the patient/family or caregiver, ordering medications, tests or procedures, and documenting clinical information in the EHR including the differential Dx, treatment, and any further evaluation and other management     Signed electronically by: Stefano Redgie Butts, MD  [facility]  [facility] [address] [place] [place], [place] [address] Phone: [phone] FAX: [phone]   CC: Gretel App, NP [address] [place] [address] Phone: [phone]  Fax: [phone]    Return to Endocrinology clinic as below: Future Appointments  Date Time Provider Department Center  02/22/2025 10:00 AM Gretel App, NP [facility] [address]  03/14/2025  8:50 AM [facility] ANNUAL WELLNESS VISIT [facility] [address]  04/19/2025 11:50 AM Bret Stamour, Donell Redgie, MD [facility] None    "

## 2025-02-22 ENCOUNTER — Ambulatory Visit: Admitting: Nurse Practitioner

## 2025-03-14 ENCOUNTER — Encounter

## 2025-04-19 ENCOUNTER — Ambulatory Visit: Admitting: Internal Medicine
